# Patient Record
Sex: Female | Born: 1940 | Race: White | Hispanic: No | Smoking: Former smoker
Health system: Southern US, Community
[De-identification: ages and names within clinical notes are randomized; demographics above are authoritative.]

## PROBLEM LIST (undated history)

## (undated) DIAGNOSIS — K219 Gastro-esophageal reflux disease without esophagitis: Secondary | ICD-10-CM

## (undated) DIAGNOSIS — F329 Major depressive disorder, single episode, unspecified: Secondary | ICD-10-CM

## (undated) DIAGNOSIS — J42 Unspecified chronic bronchitis: Secondary | ICD-10-CM

## (undated) DIAGNOSIS — J302 Other seasonal allergic rhinitis: Secondary | ICD-10-CM

## (undated) DIAGNOSIS — K5790 Diverticulosis of intestine, part unspecified, without perforation or abscess without bleeding: Secondary | ICD-10-CM

## (undated) DIAGNOSIS — E7800 Pure hypercholesterolemia, unspecified: Secondary | ICD-10-CM

## (undated) DIAGNOSIS — I10 Essential (primary) hypertension: Secondary | ICD-10-CM

## (undated) DIAGNOSIS — F32A Depression, unspecified: Secondary | ICD-10-CM

## (undated) DIAGNOSIS — C50919 Malignant neoplasm of unspecified site of unspecified female breast: Secondary | ICD-10-CM

## (undated) DIAGNOSIS — C801 Malignant (primary) neoplasm, unspecified: Secondary | ICD-10-CM

## (undated) DIAGNOSIS — J45909 Unspecified asthma, uncomplicated: Secondary | ICD-10-CM

## (undated) DIAGNOSIS — Z923 Personal history of irradiation: Secondary | ICD-10-CM

## (undated) DIAGNOSIS — M1990 Unspecified osteoarthritis, unspecified site: Secondary | ICD-10-CM

## (undated) DIAGNOSIS — M359 Systemic involvement of connective tissue, unspecified: Secondary | ICD-10-CM

## (undated) HISTORY — DX: Major depressive disorder, single episode, unspecified: F32.9

## (undated) HISTORY — PX: ABDOMINAL HYSTERECTOMY: SHX81

## (undated) HISTORY — PX: CAROTID ENDARTERECTOMY: SUR193

## (undated) HISTORY — DX: Depression, unspecified: F32.A

## (undated) HISTORY — DX: Malignant (primary) neoplasm, unspecified: C80.1

## (undated) HISTORY — DX: Pure hypercholesterolemia, unspecified: E78.00

## (undated) HISTORY — DX: Diverticulosis of intestine, part unspecified, without perforation or abscess without bleeding: K57.90

## (undated) HISTORY — PX: CHOLECYSTECTOMY: SHX55

## (undated) HISTORY — DX: Unspecified chronic bronchitis: J42

## (undated) HISTORY — PX: GALLBLADDER SURGERY: SHX652

## (undated) HISTORY — DX: Unspecified asthma, uncomplicated: J45.909

## (undated) HISTORY — PX: BREAST BIOPSY: SHX20

## (undated) HISTORY — DX: Gastro-esophageal reflux disease without esophagitis: K21.9

## (undated) HISTORY — DX: Essential (primary) hypertension: I10

## (undated) HISTORY — DX: Other seasonal allergic rhinitis: J30.2

## (undated) HISTORY — DX: Unspecified osteoarthritis, unspecified site: M19.90

---

## 2003-03-03 HISTORY — PX: COLONOSCOPY: SHX174

## 2003-12-18 ENCOUNTER — Ambulatory Visit: Payer: Self-pay | Admitting: Gastroenterology

## 2004-01-22 ENCOUNTER — Ambulatory Visit: Payer: Self-pay | Admitting: Rheumatology

## 2004-10-10 ENCOUNTER — Ambulatory Visit: Payer: Self-pay | Admitting: Internal Medicine

## 2005-03-18 ENCOUNTER — Other Ambulatory Visit: Payer: Self-pay

## 2005-03-18 ENCOUNTER — Emergency Department: Payer: Self-pay | Admitting: Emergency Medicine

## 2005-03-25 ENCOUNTER — Ambulatory Visit: Payer: Self-pay

## 2005-06-05 ENCOUNTER — Ambulatory Visit: Payer: Self-pay

## 2005-10-20 ENCOUNTER — Ambulatory Visit: Payer: Self-pay

## 2006-07-19 ENCOUNTER — Ambulatory Visit: Payer: Self-pay | Admitting: Internal Medicine

## 2006-08-17 ENCOUNTER — Ambulatory Visit: Payer: Self-pay | Admitting: Internal Medicine

## 2006-09-17 ENCOUNTER — Ambulatory Visit: Payer: Self-pay | Admitting: Internal Medicine

## 2006-11-17 ENCOUNTER — Ambulatory Visit: Payer: Self-pay | Admitting: Internal Medicine

## 2007-12-08 ENCOUNTER — Ambulatory Visit: Payer: Self-pay | Admitting: Internal Medicine

## 2008-02-28 ENCOUNTER — Ambulatory Visit: Payer: Self-pay | Admitting: Internal Medicine

## 2008-03-02 HISTORY — PX: DG  BONE DENSITY (ARMC HX): HXRAD1102

## 2008-03-06 ENCOUNTER — Ambulatory Visit: Payer: Self-pay | Admitting: Internal Medicine

## 2008-03-06 ENCOUNTER — Ambulatory Visit: Payer: Self-pay | Admitting: Vascular Surgery

## 2008-03-08 ENCOUNTER — Inpatient Hospital Stay: Payer: Self-pay | Admitting: Vascular Surgery

## 2008-05-07 ENCOUNTER — Ambulatory Visit: Payer: Self-pay | Admitting: Gastroenterology

## 2008-05-23 ENCOUNTER — Ambulatory Visit: Payer: Self-pay | Admitting: Unknown Physician Specialty

## 2008-12-10 ENCOUNTER — Ambulatory Visit: Payer: Self-pay | Admitting: Internal Medicine

## 2009-12-23 ENCOUNTER — Ambulatory Visit: Payer: Self-pay | Admitting: Internal Medicine

## 2011-02-05 ENCOUNTER — Ambulatory Visit: Payer: Self-pay | Admitting: Internal Medicine

## 2011-03-26 ENCOUNTER — Ambulatory Visit: Payer: Self-pay | Admitting: Internal Medicine

## 2011-04-22 ENCOUNTER — Ambulatory Visit: Payer: Self-pay | Admitting: Internal Medicine

## 2012-02-04 ENCOUNTER — Ambulatory Visit: Payer: Self-pay | Admitting: Hematology and Oncology

## 2012-02-04 LAB — CBC CANCER CENTER
Basophil #: 0.1 x10 3/mm (ref 0.0–0.1)
HCT: 37.8 % (ref 35.0–47.0)
HGB: 13.4 g/dL (ref 12.0–16.0)
Lymphocyte %: 22.2 %
Monocyte #: 0.6 x10 3/mm (ref 0.2–0.9)
Monocyte %: 7.7 %
Neutrophil #: 5.8 x10 3/mm (ref 1.4–6.5)
Neutrophil %: 68.8 %
Platelet: 350 x10 3/mm (ref 150–440)
RDW: 12.1 % (ref 11.5–14.5)
WBC: 8.5 x10 3/mm (ref 3.6–11.0)

## 2012-02-10 ENCOUNTER — Ambulatory Visit: Payer: Self-pay | Admitting: Internal Medicine

## 2012-03-02 ENCOUNTER — Ambulatory Visit: Payer: Self-pay | Admitting: Hematology and Oncology

## 2012-03-02 HISTORY — PX: OTHER SURGICAL HISTORY: SHX169

## 2012-04-26 ENCOUNTER — Ambulatory Visit: Payer: Self-pay | Admitting: Physician Assistant

## 2012-08-09 ENCOUNTER — Ambulatory Visit: Payer: Self-pay | Admitting: Physician Assistant

## 2013-02-21 ENCOUNTER — Ambulatory Visit: Payer: Self-pay | Admitting: Physician Assistant

## 2013-03-02 HISTORY — PX: DIAGNOSTIC MAMMOGRAM: HXRAD719

## 2013-06-09 ENCOUNTER — Ambulatory Visit: Payer: Self-pay | Admitting: Rheumatology

## 2013-08-07 DIAGNOSIS — M5412 Radiculopathy, cervical region: Secondary | ICD-10-CM | POA: Insufficient documentation

## 2013-08-07 DIAGNOSIS — M5030 Other cervical disc degeneration, unspecified cervical region: Secondary | ICD-10-CM | POA: Insufficient documentation

## 2013-08-08 DIAGNOSIS — B029 Zoster without complications: Secondary | ICD-10-CM | POA: Insufficient documentation

## 2013-08-22 DIAGNOSIS — M5416 Radiculopathy, lumbar region: Secondary | ICD-10-CM | POA: Insufficient documentation

## 2013-11-29 DIAGNOSIS — M1990 Unspecified osteoarthritis, unspecified site: Secondary | ICD-10-CM | POA: Insufficient documentation

## 2013-11-29 DIAGNOSIS — H209 Unspecified iridocyclitis: Secondary | ICD-10-CM | POA: Insufficient documentation

## 2013-11-29 DIAGNOSIS — M069 Rheumatoid arthritis, unspecified: Secondary | ICD-10-CM | POA: Insufficient documentation

## 2013-11-29 DIAGNOSIS — R945 Abnormal results of liver function studies: Secondary | ICD-10-CM | POA: Insufficient documentation

## 2014-03-06 DIAGNOSIS — H2512 Age-related nuclear cataract, left eye: Secondary | ICD-10-CM | POA: Diagnosis not present

## 2014-03-06 DIAGNOSIS — H18411 Arcus senilis, right eye: Secondary | ICD-10-CM | POA: Diagnosis not present

## 2014-03-06 DIAGNOSIS — H25012 Cortical age-related cataract, left eye: Secondary | ICD-10-CM | POA: Diagnosis not present

## 2014-03-06 DIAGNOSIS — H2511 Age-related nuclear cataract, right eye: Secondary | ICD-10-CM | POA: Diagnosis not present

## 2014-03-12 DIAGNOSIS — H2512 Age-related nuclear cataract, left eye: Secondary | ICD-10-CM | POA: Diagnosis not present

## 2014-03-12 DIAGNOSIS — H2511 Age-related nuclear cataract, right eye: Secondary | ICD-10-CM | POA: Diagnosis not present

## 2014-03-13 DIAGNOSIS — H2511 Age-related nuclear cataract, right eye: Secondary | ICD-10-CM | POA: Diagnosis not present

## 2014-03-26 DIAGNOSIS — H2511 Age-related nuclear cataract, right eye: Secondary | ICD-10-CM | POA: Diagnosis not present

## 2014-04-19 ENCOUNTER — Ambulatory Visit: Payer: Self-pay

## 2014-08-14 DIAGNOSIS — M5413 Radiculopathy, cervicothoracic region: Secondary | ICD-10-CM | POA: Diagnosis not present

## 2014-08-14 DIAGNOSIS — M9901 Segmental and somatic dysfunction of cervical region: Secondary | ICD-10-CM | POA: Diagnosis not present

## 2014-08-14 DIAGNOSIS — M5033 Other cervical disc degeneration, cervicothoracic region: Secondary | ICD-10-CM | POA: Diagnosis not present

## 2014-08-14 DIAGNOSIS — M5032 Other cervical disc degeneration, mid-cervical region: Secondary | ICD-10-CM | POA: Diagnosis not present

## 2014-09-12 DIAGNOSIS — M5032 Other cervical disc degeneration, mid-cervical region: Secondary | ICD-10-CM | POA: Diagnosis not present

## 2014-09-12 DIAGNOSIS — M9901 Segmental and somatic dysfunction of cervical region: Secondary | ICD-10-CM | POA: Diagnosis not present

## 2014-09-12 DIAGNOSIS — M5413 Radiculopathy, cervicothoracic region: Secondary | ICD-10-CM | POA: Diagnosis not present

## 2014-09-12 DIAGNOSIS — M5033 Other cervical disc degeneration, cervicothoracic region: Secondary | ICD-10-CM | POA: Diagnosis not present

## 2014-10-01 DIAGNOSIS — M5030 Other cervical disc degeneration, unspecified cervical region: Secondary | ICD-10-CM | POA: Diagnosis not present

## 2014-10-01 DIAGNOSIS — M5412 Radiculopathy, cervical region: Secondary | ICD-10-CM | POA: Diagnosis not present

## 2014-10-01 DIAGNOSIS — M4802 Spinal stenosis, cervical region: Secondary | ICD-10-CM | POA: Diagnosis not present

## 2014-11-21 DIAGNOSIS — M5030 Other cervical disc degeneration, unspecified cervical region: Secondary | ICD-10-CM | POA: Diagnosis not present

## 2014-11-21 DIAGNOSIS — M5412 Radiculopathy, cervical region: Secondary | ICD-10-CM | POA: Diagnosis not present

## 2014-11-28 ENCOUNTER — Ambulatory Visit (INDEPENDENT_AMBULATORY_CARE_PROVIDER_SITE_OTHER): Payer: Medicare PPO | Admitting: Family Medicine

## 2014-11-28 ENCOUNTER — Encounter: Payer: Self-pay | Admitting: *Deleted

## 2014-11-28 ENCOUNTER — Other Ambulatory Visit: Payer: Self-pay | Admitting: *Deleted

## 2014-11-28 VITALS — BP 124/66 | HR 68 | Temp 97.8°F | Resp 16 | Ht 62.0 in | Wt 127.4 lb

## 2014-11-28 DIAGNOSIS — J4520 Mild intermittent asthma, uncomplicated: Secondary | ICD-10-CM

## 2014-11-28 DIAGNOSIS — J432 Centrilobular emphysema: Secondary | ICD-10-CM | POA: Insufficient documentation

## 2014-11-28 DIAGNOSIS — Z9889 Other specified postprocedural states: Secondary | ICD-10-CM

## 2014-11-28 DIAGNOSIS — M5030 Other cervical disc degeneration, unspecified cervical region: Secondary | ICD-10-CM | POA: Diagnosis not present

## 2014-11-28 DIAGNOSIS — G4730 Sleep apnea, unspecified: Secondary | ICD-10-CM | POA: Insufficient documentation

## 2014-11-28 DIAGNOSIS — M1990 Unspecified osteoarthritis, unspecified site: Secondary | ICD-10-CM

## 2014-11-28 DIAGNOSIS — I10 Essential (primary) hypertension: Secondary | ICD-10-CM | POA: Diagnosis not present

## 2014-11-28 DIAGNOSIS — E785 Hyperlipidemia, unspecified: Secondary | ICD-10-CM | POA: Insufficient documentation

## 2014-11-28 DIAGNOSIS — Z7689 Persons encountering health services in other specified circumstances: Secondary | ICD-10-CM

## 2014-11-28 DIAGNOSIS — I6529 Occlusion and stenosis of unspecified carotid artery: Secondary | ICD-10-CM | POA: Insufficient documentation

## 2014-11-28 DIAGNOSIS — I6522 Occlusion and stenosis of left carotid artery: Secondary | ICD-10-CM

## 2014-11-28 DIAGNOSIS — J302 Other seasonal allergic rhinitis: Secondary | ICD-10-CM | POA: Diagnosis not present

## 2014-11-28 DIAGNOSIS — J45909 Unspecified asthma, uncomplicated: Secondary | ICD-10-CM | POA: Insufficient documentation

## 2014-11-28 DIAGNOSIS — F329 Major depressive disorder, single episode, unspecified: Secondary | ICD-10-CM

## 2014-11-28 DIAGNOSIS — M5412 Radiculopathy, cervical region: Secondary | ICD-10-CM | POA: Diagnosis not present

## 2014-11-28 DIAGNOSIS — Z23 Encounter for immunization: Secondary | ICD-10-CM | POA: Diagnosis not present

## 2014-11-28 DIAGNOSIS — M5116 Intervertebral disc disorders with radiculopathy, lumbar region: Secondary | ICD-10-CM | POA: Diagnosis not present

## 2014-11-28 DIAGNOSIS — F32A Depression, unspecified: Secondary | ICD-10-CM | POA: Insufficient documentation

## 2014-11-28 DIAGNOSIS — Z7189 Other specified counseling: Secondary | ICD-10-CM

## 2014-11-28 DIAGNOSIS — T7840XA Allergy, unspecified, initial encounter: Secondary | ICD-10-CM | POA: Insufficient documentation

## 2014-11-28 HISTORY — DX: Hyperlipidemia, unspecified: E78.5

## 2014-11-28 MED ORDER — SERTRALINE HCL 50 MG PO TABS: 50.0000 mg | ORAL_TABLET | Freq: Every day | ORAL | Status: DC

## 2014-11-28 MED ORDER — SIMVASTATIN 20 MG PO TABS: 20.0000 mg | ORAL_TABLET | Freq: Every day | ORAL | Status: DC

## 2014-11-28 MED ORDER — AMLODIPINE BESYLATE 5 MG PO TABS: 5.0000 mg | ORAL_TABLET | Freq: Every day | ORAL | Status: DC

## 2014-11-28 MED ORDER — GABAPENTIN 300 MG PO CAPS: 300.0000 mg | ORAL_CAPSULE | Freq: Three times a day (TID) | ORAL | Status: DC

## 2014-11-28 MED ORDER — MONTELUKAST SODIUM 10 MG PO TABS: 10.0000 mg | ORAL_TABLET | Freq: Every day | ORAL | Status: DC

## 2014-11-28 MED ORDER — LOSARTAN POTASSIUM-HCTZ 100-25 MG PO TABS: 1.0000 | ORAL_TABLET | Freq: Every day | ORAL | Status: DC

## 2014-11-28 NOTE — Progress Notes (Signed)
Name: Elizabeth Bailey   MRN: [id number]    DOB: 01/30/1941   Date:11/28/2014       Progress Note  Subjective  Chief Complaint  Chief Complaint  Patient presents with  . Establish Care    HPI Here to establish care.  Has HBP, elevated lipids, Rheumatoid arthritiis, Chronic nerve pain.  Depression, insomnia, anxiety.  Has some depression ands chronic anxiety.   COPD,  She has taken Xanax daily for years.  Also has reduced recommended dose of Gabapentin on her own.   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  NEg CXR since.  No problem-specific assessment & plan notes found for this encounter.   Past Medical History  Diagnosis Date  . Seasonal allergies   . Arthritis   . Asthma   . Depression   . Diverticulosis   . Chronic bronchitis   . GERD (gastroesophageal reflux disease)   . Hypertension   . High cholesterol     Past Surgical History  Procedure Laterality Date  . Diagnostic mammogram  2015  . Pap smear  2014  . Colonoscopy  2005  . Dg  bone density ([facility] hx)  2010  . Abdominal hysterectomy    . Breast biopsy    . Gallbladder surgery      Family History  Problem Relation Age of Onset  . Pneumonia Mother   . Depression Mother   . Depression Sister   . Stroke Brother   . Heart disease Brother     Social History   Social History  . Marital Status: Married    Spouse Name: N/A  . Number of Children: N/A  . Years of Education: N/A   Occupational History  . Not on file.   Social History Main Topics  . Smoking status: Former Smoker -- 0.50 packs/day for 10 years    Types: Cigarettes  . Smokeless tobacco: Never Used     Comment: quit 1988  . Alcohol Use: 0.0 oz/week    0 Standard drinks or equivalent per week     Comment: wine twice weekly  . Drug Use: No  . Sexual Activity: Not on file   Other Topics Concern  . Not on file   Social History Narrative  . No narrative on file     Current outpatient prescriptions:  .  albuterol (PROVENTIL) (2.5 MG/3ML)  0.083% nebulizer solution, Inhale 2 puffs into the lungs every 6 (six) hours as needed., Disp: , Rfl:  .  amLODipine (NORVASC) 5 MG tablet, Take 1 tablet (5 mg total) by mouth daily., Disp: 30 tablet, Rfl: 12 .  budesonide-formoterol (SYMBICORT) 160-4.5 MCG/ACT inhaler, Inhale 1 puff into the lungs daily., Disp: , Rfl:  .  calcium-vitamin D (CALCIUM 500/D) 500-200 MG-UNIT tablet, Take 1 tablet by mouth daily., Disp: , Rfl:  .  DHA-EPA-VITAMIN E PO, Take 1 capsule by mouth daily., Disp: , Rfl:  .  esomeprazole (NEXIUM) 40 MG capsule, Take 40 mg by mouth daily., Disp: , Rfl:  .  fluticasone (FLONASE) 50 MCG/ACT nasal spray, Place 2 sprays into the nose daily as needed., Disp: , Rfl:  .  folic acid (FOLVITE) 1 MG tablet, Take 1 tablet by mouth daily., Disp: , Rfl:  .  gabapentin (NEURONTIN) 300 MG capsule, Take 1 capsule (300 mg total) by mouth 3 (three) times daily., Disp: 90 capsule, Rfl: 6 .  HUMIRA PEN 40 MG/0.8ML PNKT, Inject 0.8 mLs into the skin as directed., Disp: , Rfl:  .  HYDROcodone-acetaminophen (NORCO/VICODIN) 5-325 MG tablet, Take 1 tablet by mouth 2 (two) times daily as needed., Disp: , Rfl:  .  losartan-hydrochlorothiazide (HYZAAR) 100-25 MG tablet, Take 1 tablet by mouth daily., Disp: 30 tablet, Rfl: 12 .  Melatonin 10 MG TABS, Take 1 tablet by mouth at bedtime., Disp: , Rfl:  .  montelukast (SINGULAIR) 10 MG tablet, Take 1 tablet (10 mg total) by mouth daily., Disp: 30 tablet, Rfl: 12 .  Multiple Vitamin (MULTI-VITAMINS) TABS, Take 1 tablet by mouth daily., Disp: , Rfl:  .  simvastatin (ZOCOR) 20 MG tablet, Take 1 tablet (20 mg total) by mouth at bedtime., Disp: 30 tablet, Rfl: 12  Allergies  Allergen Reactions  . Flucytosine Other (See Comments)    Sore mouth  . Naproxen Nausea Only    GI upset  . Other Other (See Comments)    Frequent urination     Review of Systems  Constitutional: Negative for fever, chills, weight loss and malaise/fatigue.  HENT: Negative for  hearing loss.   Eyes: Negative for blurred vision and double vision.  Respiratory: Positive for shortness of breath. Negative for cough, sputum production and wheezing.   Cardiovascular: Negative for chest pain, palpitations, orthopnea, leg swelling and PND.  Gastrointestinal: Positive for heartburn. Negative for abdominal pain and blood in stool.  Genitourinary: Negative for dysuria, urgency and frequency.  Musculoskeletal: Positive for joint pain and neck pain.  Skin: Negative for rash.  Neurological: Negative for sensory change, focal weakness, weakness and headaches.  Psychiatric/Behavioral: Positive for depression. The patient is nervous/anxious and has insomnia.       Objective  Filed Vitals:   11/28/14 1008  BP: 124/66  Pulse: 68  Temp: 97.8 F (36.6 C)  Resp: 16  Height: 5\' 2"  (1.575 m)  Weight: 127 lb 6.4 oz (57.788 kg)    Physical Exam  Constitutional: She is well-developed, well-nourished, and in no distress. No distress.  HENT:  Head: Normocephalic and atraumatic.  Eyes: Conjunctivae and EOM are normal. Pupils are equal, round, and reactive to light. No scleral icterus.  Neck: Normal range of motion. Neck supple. Carotid bruit is not present. No thyromegaly present.  Cardiovascular: Normal rate, regular rhythm, normal heart sounds and intact distal pulses.  Exam reveals no gallop and no friction rub.   No murmur heard. Pulmonary/Chest: Effort normal and breath sounds normal. No respiratory distress. She has no wheezes. She has no rales.  Abdominal: Soft. Bowel sounds are normal. She exhibits no distension, no abdominal bruit and no mass. There is no tenderness.  Musculoskeletal: She exhibits no edema.  Lymphadenopathy:    She has no cervical adenopathy.  Neurological: She is alert.  Psychiatric:   Affect is sl. Depressed.  Vitals reviewed.      No results found for this or any previous visit (from the past 2160 hour(s)).   Assessment & Plan  Problem  List Items Addressed This Visit      Cardiovascular and Mediastinum   BP (high blood pressure)   Relevant Medications   losartan-hydrochlorothiazide (HYZAAR) 100-25 MG tablet   amLODipine (NORVASC) 5 MG tablet   simvastatin (ZOCOR) 20 MG tablet   Other Relevant Orders   Comprehensive Metabolic Panel (CMET)   TSH     Respiratory   Airway hyperreactivity   Relevant Medications   montelukast (SINGULAIR) 10 MG tablet   Other Relevant Orders   CBC with Differential     Nervous and Auditory   Cervical radiculitis   Relevant Medications  gabapentin (NEURONTIN) 300 MG capsule   Neuritis or radiculitis due to rupture of lumbar intervertebral disc   Relevant Medications   gabapentin (NEURONTIN) 300 MG capsule     Musculoskeletal and Integument   DDD (degenerative disc disease), cervical   Arthritis    Other Visit Diagnoses    Need for influenza vaccination    -  Primary    Relevant Orders    Flu vaccine HIGH DOSE PF (Fluzone High dose)    Seasonal allergies        Relevant Medications    montelukast (SINGULAIR) 10 MG tablet    Hyperlipidemia        Relevant Medications    losartan-hydrochlorothiazide (HYZAAR) 100-25 MG tablet    amLODipine (NORVASC) 5 MG tablet    simvastatin (ZOCOR) 20 MG tablet    Other Relevant Orders    Lipid Profile       Meds ordered this encounter  Medications  . Melatonin 10 MG TABS    Sig: Take 1 tablet by mouth at bedtime.  . gabapentin (NEURONTIN) 300 MG capsule    Sig: Take 1 capsule (300 mg total) by mouth 3 (three) times daily.    Dispense:  90 capsule    Refill:  6  . losartan-hydrochlorothiazide (HYZAAR) 100-25 MG tablet    Sig: Take 1 tablet by mouth daily.    Dispense:  30 tablet    Refill:  12  . amLODipine (NORVASC) 5 MG tablet    Sig: Take 1 tablet (5 mg total) by mouth daily.    Dispense:  30 tablet    Refill:  12  . montelukast (SINGULAIR) 10 MG tablet    Sig: Take 1 tablet (10 mg total) by mouth daily.    Dispense:  30  tablet    Refill:  12  . simvastatin (ZOCOR) 20 MG tablet    Sig: Take 1 tablet (20 mg total) by mouth at bedtime.    Dispense:  30 tablet    Refill:  12    1. Need for influenza vaccination  - Flu vaccine HIGH DOSE PF (Fluzone High dose)  2. Essential hypertension  - losartan-hydrochlorothiazide (HYZAAR) 100-25 MG tablet; Take 1 tablet by mouth daily.  Dispense: 30 tablet; Refill: 12 - amLODipine (NORVASC) 5 MG tablet; Take 1 tablet (5 mg total) by mouth daily.  Dispense: 30 tablet; Refill: 12 - Comprehensive Metabolic Panel (CMET) - TSH  3. Airway hyperreactivity, mild intermittent, uncomplicated  - CBC with Differential  4. Cervical radiculitis   5. DDD (degenerative disc disease), cervical   6. Arthritis   7. Neuritis or radiculitis due to rupture of lumbar intervertebral disc  - gabapentin (NEURONTIN) 300 MG capsule; Take 1 capsule (300 mg total) by mouth 3 (three) times daily.  Dispense: 90 capsule; Refill: 6  8. Seasonal allergies  - montelukast (SINGULAIR) 10 MG tablet; Take 1 tablet (10 mg total) by mouth daily.  Dispense: 30 tablet; Refill: 12  9. Hyperlipidemia  - simvastatin (ZOCOR) 20 MG tablet; Take 1 tablet (20 mg total) by mouth at bedtime.  Dispense: 30 tablet; Refill: 12 - Lipid Profile  10. Depression  - sertraline (ZOLOFT) 50 MG tablet; Take 1 tablet (50 mg total) by mouth daily.  Dispense: 30 tablet; Refill: 3  11. Carotid artery stenosis, left   12. H/O carotid endarterectomy

## 2014-12-06 DIAGNOSIS — J449 Chronic obstructive pulmonary disease, unspecified: Secondary | ICD-10-CM | POA: Diagnosis not present

## 2014-12-06 DIAGNOSIS — M0579 Rheumatoid arthritis with rheumatoid factor of multiple sites without organ or systems involvement: Secondary | ICD-10-CM | POA: Diagnosis not present

## 2014-12-06 DIAGNOSIS — M5030 Other cervical disc degeneration, unspecified cervical region: Secondary | ICD-10-CM | POA: Diagnosis not present

## 2014-12-11 DIAGNOSIS — M9903 Segmental and somatic dysfunction of lumbar region: Secondary | ICD-10-CM | POA: Diagnosis not present

## 2014-12-11 DIAGNOSIS — M4607 Spinal enthesopathy, lumbosacral region: Secondary | ICD-10-CM | POA: Diagnosis not present

## 2014-12-11 DIAGNOSIS — M9904 Segmental and somatic dysfunction of sacral region: Secondary | ICD-10-CM | POA: Diagnosis not present

## 2014-12-12 DIAGNOSIS — I10 Essential (primary) hypertension: Secondary | ICD-10-CM | POA: Diagnosis not present

## 2014-12-12 DIAGNOSIS — J4520 Mild intermittent asthma, uncomplicated: Secondary | ICD-10-CM | POA: Diagnosis not present

## 2014-12-12 DIAGNOSIS — E785 Hyperlipidemia, unspecified: Secondary | ICD-10-CM | POA: Diagnosis not present

## 2014-12-13 LAB — COMPREHENSIVE METABOLIC PANEL
ALK PHOS: 69 IU/L (ref 39–117)
ALT: 16 IU/L (ref 0–32)
AST: 27 IU/L (ref 0–40)
Albumin/Globulin Ratio: 1.4 (ref 1.1–2.5)
Albumin: 4.2 g/dL (ref 3.5–4.8)
BILIRUBIN TOTAL: 0.9 mg/dL (ref 0.0–1.2)
BUN/Creatinine Ratio: 13 (ref 11–26)
BUN: 12 mg/dL (ref 8–27)
CHLORIDE: 89 mmol/L — AB (ref 97–108)
CO2: 27 mmol/L (ref 18–29)
Calcium: 9.9 mg/dL (ref 8.7–10.3)
Creatinine, Ser: 0.91 mg/dL (ref 0.57–1.00)
GFR calc Af Amer: 72 mL/min/{1.73_m2} (ref >59)
GFR calc non Af Amer: 63 mL/min/{1.73_m2} (ref >59)
GLUCOSE: 103 mg/dL — AB (ref 65–99)
Globulin, Total: 3 g/dL (ref 1.5–4.5)
Potassium: 4.3 mmol/L (ref 3.5–5.2)
Sodium: 131 mmol/L — ABNORMAL LOW (ref 134–144)
Total Protein: 7.2 g/dL (ref 6.0–8.5)

## 2014-12-13 LAB — CBC WITH DIFFERENTIAL/PLATELET
BASOS ABS: 0 10*3/uL (ref 0.0–0.2)
Basos: 0 %
EOS (ABSOLUTE): 0.2 10*3/uL (ref 0.0–0.4)
Eos: 2 %
HEMOGLOBIN: 13.9 g/dL (ref 11.1–15.9)
Hematocrit: 41 % (ref 34.0–46.6)
IMMATURE GRANS (ABS): 0 10*3/uL (ref 0.0–0.1)
IMMATURE GRANULOCYTES: 0 %
LYMPHS: 30 %
Lymphocytes Absolute: 2.8 10*3/uL (ref 0.7–3.1)
MCH: 32.2 pg (ref 26.6–33.0)
MCHC: 33.9 g/dL (ref 31.5–35.7)
MCV: 95 fL (ref 79–97)
MONOCYTES: 9 %
Monocytes Absolute: 0.8 10*3/uL (ref 0.1–0.9)
NEUTROS PCT: 59 %
Neutrophils Absolute: 5.4 10*3/uL (ref 1.4–7.0)
Platelets: 492 10*3/uL — ABNORMAL HIGH (ref 150–379)
RBC: 4.32 x10E6/uL (ref 3.77–5.28)
RDW: 13.4 % (ref 12.3–15.4)
WBC: 9.3 10*3/uL (ref 3.4–10.8)

## 2014-12-13 LAB — LIPID PANEL
CHOLESTEROL TOTAL: 178 mg/dL (ref 100–199)
Chol/HDL Ratio: 2.1 ratio units (ref 0.0–4.4)
HDL: 84 mg/dL (ref >39)
LDL CALC: 80 mg/dL (ref 0–99)
TRIGLYCERIDES: 72 mg/dL (ref 0–149)
VLDL CHOLESTEROL CAL: 14 mg/dL (ref 5–40)

## 2014-12-13 LAB — TSH: TSH: 2.5 u[IU]/mL (ref 0.450–4.500)

## 2014-12-14 NOTE — Progress Notes (Signed)
Advised to lower water intake. [facility]

## 2015-01-09 ENCOUNTER — Ambulatory Visit (INDEPENDENT_AMBULATORY_CARE_PROVIDER_SITE_OTHER): Payer: Medicare PPO | Admitting: Family Medicine

## 2015-01-09 ENCOUNTER — Encounter: Payer: Self-pay | Admitting: Family Medicine

## 2015-01-09 VITALS — BP 107/56 | HR 80 | Resp 16 | Ht 62.0 in | Wt 131.4 lb

## 2015-01-09 DIAGNOSIS — I10 Essential (primary) hypertension: Secondary | ICD-10-CM

## 2015-01-09 DIAGNOSIS — Z23 Encounter for immunization: Secondary | ICD-10-CM

## 2015-01-09 DIAGNOSIS — M0579 Rheumatoid arthritis with rheumatoid factor of multiple sites without organ or systems involvement: Secondary | ICD-10-CM | POA: Insufficient documentation

## 2015-01-09 DIAGNOSIS — F329 Major depressive disorder, single episode, unspecified: Secondary | ICD-10-CM | POA: Diagnosis not present

## 2015-01-09 DIAGNOSIS — M069 Rheumatoid arthritis, unspecified: Secondary | ICD-10-CM

## 2015-01-09 DIAGNOSIS — E785 Hyperlipidemia, unspecified: Secondary | ICD-10-CM

## 2015-01-09 DIAGNOSIS — J449 Chronic obstructive pulmonary disease, unspecified: Secondary | ICD-10-CM | POA: Diagnosis not present

## 2015-01-09 DIAGNOSIS — G4700 Insomnia, unspecified: Secondary | ICD-10-CM | POA: Diagnosis not present

## 2015-01-09 DIAGNOSIS — F32A Depression, unspecified: Secondary | ICD-10-CM

## 2015-01-09 MED ORDER — SIMVASTATIN 20 MG PO TABS: ORAL_TABLET | ORAL | Status: DC

## 2015-01-09 MED ORDER — SERTRALINE HCL 50 MG PO TABS: ORAL_TABLET | ORAL | Status: DC

## 2015-01-09 NOTE — Patient Instructions (Signed)
Continue other medications at current doses.

## 2015-01-09 NOTE — Progress Notes (Signed)
Name: Elizabeth Bailey   MRN: [id number]    DOB: December 06, 1940   Date:01/09/2015       Progress Note  Subjective  Chief Complaint  Chief Complaint  Patient presents with  . Hypertension    HPI Here for f/u of HBP.  Taking meds.  Also with COPD and depression with insomnia.  Not sleeping well.  Requests Prevnar today.  No problem-specific assessment & plan notes found for this encounter.   Past Medical History  Diagnosis Date  . Seasonal allergies   . Arthritis   . Asthma   . Depression   . Diverticulosis   . Chronic bronchitis ([place])   . GERD (gastroesophageal reflux disease)   . Hypertension   . High cholesterol     Social History  Substance Use Topics  . Smoking status: Former Smoker -- 0.50 packs/day for 10 years    Types: Cigarettes  . Smokeless tobacco: Never Used     Comment: quit 1988  . Alcohol Use: 0.0 oz/week    0 Standard drinks or equivalent per week     Comment: wine twice weekly     Current outpatient prescriptions:  .  Adalimumab (HUMIRA PEN) 40 MG/0.8ML PNKT, Inject into the skin., Disp: , Rfl:  .  albuterol (PROVENTIL) (2.5 MG/3ML) 0.083% nebulizer solution, Inhale 2 puffs into the lungs every 6 (six) hours as needed., Disp: , Rfl:  .  amLODipine (NORVASC) 5 MG tablet, Take 1 tablet (5 mg total) by mouth daily., Disp: 30 tablet, Rfl: 12 .  budesonide-formoterol (SYMBICORT) 160-4.5 MCG/ACT inhaler, Inhale 1 puff into the lungs daily., Disp: , Rfl:  .  calcium-vitamin D (CALCIUM 500/D) 500-200 MG-UNIT tablet, Take 1 tablet by mouth daily., Disp: , Rfl:  .  DHA-EPA-VITAMIN E PO, Take 1 capsule by mouth daily., Disp: , Rfl:  .  esomeprazole (NEXIUM) 40 MG capsule, Take 40 mg by mouth daily., Disp: , Rfl:  .  fluticasone (FLONASE) 50 MCG/ACT nasal spray, Place 2 sprays into the nose daily as needed., Disp: , Rfl:  .  folic acid (FOLVITE) 1 MG tablet, Take 1 tablet by mouth daily., Disp: , Rfl:  .  gabapentin (NEURONTIN) 300 MG capsule, Take 1 capsule (300 mg  total) by mouth 3 (three) times daily., Disp: 90 capsule, Rfl: 6 .  HYDROcodone-acetaminophen (NORCO/VICODIN) 5-325 MG tablet, Take 1 tablet by mouth 2 (two) times daily as needed., Disp: , Rfl:  .  losartan-hydrochlorothiazide (HYZAAR) 100-25 MG tablet, Take 1 tablet by mouth daily., Disp: 30 tablet, Rfl: 12 .  Melatonin 10 MG TABS, Take 2 tablets by mouth at bedtime. , Disp: , Rfl:  .  montelukast (SINGULAIR) 10 MG tablet, Take 1 tablet (10 mg total) by mouth daily., Disp: 30 tablet, Rfl: 12 .  Multiple Vitamin (MULTI-VITAMINS) TABS, Take 1 tablet by mouth daily., Disp: , Rfl:  .  sertraline (ZOLOFT) 50 MG tablet, Take 1.5 tablets daily, Disp: 45 tablet, Rfl: 6 .  simvastatin (ZOCOR) 20 MG tablet, Take 1 tablet every other night., Disp: 30 tablet, Rfl: 12  Allergies  Allergen Reactions  . Flucytosine Other (See Comments)    Sore mouth  . Naproxen Nausea Only    GI upset  . Other Other (See Comments)    Frequent urination    Review of Systems  Constitutional: Negative for fever, chills, weight loss and malaise/fatigue.  HENT: Negative for hearing loss.   Eyes: Negative for blurred vision and double vision.  Respiratory: Negative for cough, sputum production,  shortness of breath and wheezing.   Cardiovascular: Negative for chest pain, palpitations and leg swelling.  Gastrointestinal: Negative for heartburn, abdominal pain and blood in stool.  Genitourinary: Negative for dysuria, urgency and frequency.  Musculoskeletal: Positive for back pain and joint pain. Negative for myalgias.  Skin: Negative for rash.  Neurological: Negative for weakness and headaches.  Psychiatric/Behavioral: Positive for depression. The patient has insomnia.       Objective  Filed Vitals:   01/09/15 0952  BP: 107/56  Pulse: 80  Resp: 16  Height: 5\' 2"  (1.575 m)  Weight: 131 lb 6.4 oz (59.603 kg)     Physical Exam  Constitutional: She is oriented to person, place, and time and well-developed,  well-nourished, and in no distress. No distress.  HENT:  Head: Normocephalic and atraumatic.  Eyes: Conjunctivae and EOM are normal. Pupils are equal, round, and reactive to light. No scleral icterus.  Neck: Normal range of motion. Neck supple. Carotid bruit is not present. No thyromegaly present.  Cardiovascular: Normal rate, regular rhythm, normal heart sounds and intact distal pulses.  Exam reveals no gallop and no friction rub.   No murmur heard. Pulmonary/Chest: Effort normal and breath sounds normal. No respiratory distress. She has no wheezes. She has no rales.  Abdominal: Soft. Bowel sounds are normal. She exhibits no distension, no abdominal bruit and no mass. There is no tenderness.  Musculoskeletal: She exhibits no edema.  RA changed of bilateral hands/fingers  Lymphadenopathy:    She has no cervical adenopathy.  Neurological: She is alert and oriented to person, place, and time.  Vitals reviewed.     Recent Results (from the past 2160 hour(s))  Comprehensive Metabolic Panel (CMET)     Status: Abnormal   Collection Time: 12/12/14  8:04 AM  Result Value Ref Range   Glucose 103 (H) 65 - 99 mg/dL   BUN 12 8 - 27 mg/dL   Creatinine, Ser 0.91 0.57 - 1.00 mg/dL   GFR calc non Af Amer 63 >59 mL/min/1.73   GFR calc Af Amer 72 >59 mL/min/1.73   BUN/Creatinine Ratio 13 11 - 26   Sodium 131 (L) 134 - 144 mmol/L    Comment: **Effective December 17, 2014 the reference interval**   for Sodium, Serum will be changing to:                                             136 - 144    Potassium 4.3 3.5 - 5.2 mmol/L    Comment: **Effective December 17, 2014 the reference interval**   for Potassium, Serum will be changing to:                          0 -  7 days        3.7 - 5.2                          8 - 30 days        3.7 - 6.4                          1 -  6 months      3.8 - 6.0  7 months -  1 year        3.8 - 5.3                              >1 year        3.5 -  5.2    Chloride 89 (L) 97 - 108 mmol/L    Comment: **Effective December 17, 2014 the reference interval**   for Chloride, Serum will be changing to:                                              97 - 106    CO2 27 18 - 29 mmol/L   Calcium 9.9 8.7 - 10.3 mg/dL   Total Protein 7.2 6.0 - 8.5 g/dL   Albumin 4.2 3.5 - 4.8 g/dL   Globulin, Total 3.0 1.5 - 4.5 g/dL   Albumin/Globulin Ratio 1.4 1.1 - 2.5   Bilirubin Total 0.9 0.0 - 1.2 mg/dL   Alkaline Phosphatase 69 39 - 117 IU/L   AST 27 0 - 40 IU/L   ALT 16 0 - 32 IU/L  CBC with Differential     Status: Abnormal   Collection Time: 12/12/14  8:04 AM  Result Value Ref Range   WBC 9.3 3.4 - 10.8 x10E3/uL   RBC 4.32 3.77 - 5.28 x10E6/uL   Hemoglobin 13.9 11.1 - 15.9 g/dL   Hematocrit 41.0 34.0 - 46.6 %   MCV 95 79 - 97 fL   MCH 32.2 26.6 - 33.0 pg   MCHC 33.9 31.5 - 35.7 g/dL   RDW 13.4 12.3 - 15.4 %   Platelets 492 (H) 150 - 379 x10E3/uL   Neutrophils 59 %   Lymphs 30 %   Monocytes 9 %   Eos 2 %   Basos 0 %   Neutrophils Absolute 5.4 1.4 - 7.0 x10E3/uL   Lymphocytes Absolute 2.8 0.7 - 3.1 x10E3/uL   Monocytes Absolute 0.8 0.1 - 0.9 x10E3/uL   EOS (ABSOLUTE) 0.2 0.0 - 0.4 x10E3/uL   Basophils Absolute 0.0 0.0 - 0.2 x10E3/uL   Immature Granulocytes 0 %   Immature Grans (Abs) 0.0 0.0 - 0.1 x10E3/uL  Lipid Profile     Status: None   Collection Time: 12/12/14  8:04 AM  Result Value Ref Range   Cholesterol, Total 178 100 - 199 mg/dL   Triglycerides 72 0 - 149 mg/dL   HDL 84 >39 mg/dL    Comment: According to ATP-III Guidelines, HDL-C >59 mg/dL is considered a negative risk factor for CHD.    VLDL Cholesterol Cal 14 5 - 40 mg/dL   LDL Calculated 80 0 - 99 mg/dL   Chol/HDL Ratio 2.1 0.0 - 4.4 ratio units    Comment:                                   T. Chol/HDL Ratio                                             Men  Women  1/2 Avg.Risk  3.4    3.3                                   Avg.Risk  5.0     4.4                                2X Avg.Risk  9.6    7.1                                3X Avg.Risk 23.4   11.0   TSH     Status: None   Collection Time: 12/12/14  8:04 AM  Result Value Ref Range   TSH 2.500 0.450 - 4.500 uIU/mL     Assessment & Plan 1. Essential hypertension  - Basic Metabolic Panel (BMET)  2. Rheumatoid arthritis involving both hands, unspecified rheumatoid factor presence ([place])   3. Chronic obstructive pulmonary disease, unspecified COPD type ([place])   4. Depression  - sertraline (ZOLOFT) 50 MG tablet; Take 1.5 tablets daily  Dispense: 45 tablet; Refill: 6  5. Insomnia  - sertraline (ZOLOFT) 50 MG tablet; Take 1.5 tablets daily  Dispense: 45 tablet; Refill: 6  6. Immunization due  - Pneumococcal conjugate vaccine 13-valent  7. Hyperlipidemia  - simvastatin (ZOCOR) 20 MG tablet; Take 1 tablet every other night.  Dispense: 30 tablet; Refill: 12

## 2015-01-15 DIAGNOSIS — M9904 Segmental and somatic dysfunction of sacral region: Secondary | ICD-10-CM | POA: Diagnosis not present

## 2015-01-15 DIAGNOSIS — M9903 Segmental and somatic dysfunction of lumbar region: Secondary | ICD-10-CM | POA: Diagnosis not present

## 2015-01-15 DIAGNOSIS — M4607 Spinal enthesopathy, lumbosacral region: Secondary | ICD-10-CM | POA: Diagnosis not present

## 2015-01-16 DIAGNOSIS — J069 Acute upper respiratory infection, unspecified: Secondary | ICD-10-CM | POA: Diagnosis not present

## 2015-02-11 DIAGNOSIS — I10 Essential (primary) hypertension: Secondary | ICD-10-CM | POA: Diagnosis not present

## 2015-02-12 LAB — BASIC METABOLIC PANEL
BUN/Creatinine Ratio: 15 (ref 11–26)
BUN: 13 mg/dL (ref 8–27)
CALCIUM: 9.5 mg/dL (ref 8.7–10.3)
CHLORIDE: 92 mmol/L — AB (ref 96–106)
CO2: 26 mmol/L (ref 18–29)
Creatinine, Ser: 0.88 mg/dL (ref 0.57–1.00)
GFR calc Af Amer: 75 mL/min/{1.73_m2} (ref >59)
GFR calc non Af Amer: 65 mL/min/{1.73_m2} (ref >59)
GLUCOSE: 85 mg/dL (ref 65–99)
Potassium: 4.2 mmol/L (ref 3.5–5.2)
Sodium: 134 mmol/L (ref 134–144)

## 2015-03-28 ENCOUNTER — Telehealth: Payer: Self-pay | Admitting: Family Medicine

## 2015-03-28 ENCOUNTER — Encounter: Payer: Self-pay | Admitting: Family Medicine

## 2015-03-28 ENCOUNTER — Ambulatory Visit (INDEPENDENT_AMBULATORY_CARE_PROVIDER_SITE_OTHER): Payer: 59 | Admitting: Family Medicine

## 2015-03-28 VITALS — BP 135/75 | HR 61 | Temp 97.5°F | Resp 16 | Ht 62.0 in | Wt 133.0 lb

## 2015-03-28 DIAGNOSIS — G4700 Insomnia, unspecified: Secondary | ICD-10-CM

## 2015-03-28 DIAGNOSIS — I10 Essential (primary) hypertension: Secondary | ICD-10-CM | POA: Diagnosis not present

## 2015-03-28 DIAGNOSIS — R2231 Localized swelling, mass and lump, right upper limb: Secondary | ICD-10-CM

## 2015-03-28 DIAGNOSIS — R2230 Localized swelling, mass and lump, unspecified upper limb: Secondary | ICD-10-CM | POA: Insufficient documentation

## 2015-03-28 MED ORDER — ESCITALOPRAM OXALATE 5 MG PO TABS: 5.0000 mg | ORAL_TABLET | Freq: Every day | ORAL | Status: DC

## 2015-03-28 NOTE — Telephone Encounter (Signed)
Tiffany from [facility] called have a question about a medication that was sent in today wanted to know if this was replacing something

## 2015-03-28 NOTE — Progress Notes (Signed)
Name: Elizabeth Bailey   MRN: [id number]    DOB: November 21, 1940   Date:03/28/2015       Progress Note  Subjective  Chief Complaint  Chief Complaint  Patient presents with  . Hypertension  . Insomnia  . right hand lesion    HPI Here for f/u of HBP.  She has not taken her BP meds today.  She also c/o not being able to sleep.  Not using Melatonin and has stopped Zoloft.  She has had insomnia for a long time.  She asks for Xanax which she had taken for quite some time in the past for this.    Also has a "place" on her R 3rd finger that is sore.  No trauma.  No problem-specific assessment & plan notes found for this encounter.   Past Medical History  Diagnosis Date  . Seasonal allergies   . Arthritis   . Asthma   . Depression   . Diverticulosis   . Chronic bronchitis ([place])   . GERD (gastroesophageal reflux disease)   . Hypertension   . High cholesterol     Past Surgical History  Procedure Laterality Date  . Diagnostic mammogram  2015  . Pap smear  2014  . Colonoscopy  2005  . Dg  bone density ([facility] hx)  2010  . Abdominal hysterectomy    . Breast biopsy    . Gallbladder surgery      Family History  Problem Relation Age of Onset  . Pneumonia Mother   . Depression Mother   . Depression Sister   . Stroke Brother   . Heart disease Brother     Social History   Social History  . Marital Status: Married    Spouse Name: N/A  . Number of Children: N/A  . Years of Education: N/A   Occupational History  . Not on file.   Social History Main Topics  . Smoking status: Former Smoker -- 0.50 packs/day for 10 years    Types: Cigarettes  . Smokeless tobacco: Never Used     Comment: quit 1988  . Alcohol Use: 0.0 oz/week    0 Standard drinks or equivalent per week     Comment: wine twice weekly  . Drug Use: No  . Sexual Activity: Not on file   Other Topics Concern  . Not on file   Social History Narrative     Current outpatient prescriptions:  .  Adalimumab (HUMIRA  PEN) 40 MG/0.8ML PNKT, Inject into the skin., Disp: , Rfl:  .  albuterol (PROVENTIL) (2.5 MG/3ML) 0.083% nebulizer solution, Inhale 2 puffs into the lungs every 6 (six) hours as needed., Disp: , Rfl:  .  amLODipine (NORVASC) 5 MG tablet, Take 1 tablet (5 mg total) by mouth daily., Disp: 30 tablet, Rfl: 12 .  budesonide-formoterol (SYMBICORT) 160-4.5 MCG/ACT inhaler, Inhale 1 puff into the lungs daily., Disp: , Rfl:  .  calcium-vitamin D (CALCIUM 500/D) 500-200 MG-UNIT tablet, Take 1 tablet by mouth daily., Disp: , Rfl:  .  DHA-EPA-VITAMIN E PO, Take 1 capsule by mouth daily., Disp: , Rfl:  .  fluticasone (FLONASE) 50 MCG/ACT nasal spray, Place 2 sprays into the nose daily as needed., Disp: , Rfl:  .  folic acid (FOLVITE) 1 MG tablet, Take 1 tablet by mouth daily., Disp: , Rfl:  .  gabapentin (NEURONTIN) 300 MG capsule, Take 1 capsule (300 mg total) by mouth 3 (three) times daily., Disp: 90 capsule, Rfl: 6 .  HYDROcodone-acetaminophen (NORCO/VICODIN) 5-325  MG tablet, Take 1 tablet by mouth 2 (two) times daily as needed., Disp: , Rfl:  .  losartan-hydrochlorothiazide (HYZAAR) 100-25 MG tablet, Take 1 tablet by mouth daily., Disp: 30 tablet, Rfl: 12 .  montelukast (SINGULAIR) 10 MG tablet, Take 1 tablet (10 mg total) by mouth daily., Disp: 30 tablet, Rfl: 12 .  Multiple Vitamin (MULTI-VITAMINS) TABS, Take 1 tablet by mouth daily., Disp: , Rfl:  .  simvastatin (ZOCOR) 20 MG tablet, Take 1 tablet every other night., Disp: 30 tablet, Rfl: 12 .  escitalopram (LEXAPRO) 5 MG tablet, Take 1 tablet (5 mg total) by mouth daily., Disp: 30 tablet, Rfl: 6 .  esomeprazole (NEXIUM) 40 MG capsule, Take 40 mg by mouth as needed. Reported on 03/28/2015, Disp: , Rfl:  .  Melatonin 10 MG TABS, Take 2 tablets by mouth at bedtime. Reported on 03/28/2015, Disp: , Rfl:  .  sertraline (ZOLOFT) 50 MG tablet, Take 1.5 tablets daily (Patient not taking: Reported on 03/28/2015), Disp: 45 tablet, Rfl: 6  Allergies  Allergen  Reactions  . Cyclobenzaprine     Other reaction(s): Other (See Comments) Frequent urination  . Flucytosine Other (See Comments)    Sore mouth  . Fluticasone-Salmeterol     Other reaction(s): Other (See Comments) Sore mouth  . Naproxen Nausea Only    GI upset  . Other Other (See Comments)    Other reaction(s): Other (See Comments) Frequent urination Frequent urination     Review of Systems  Constitutional: Negative for fever, chills, weight loss and malaise/fatigue.  HENT: Negative for hearing loss.   Eyes: Negative for blurred vision and double vision.  Respiratory: Negative for cough, shortness of breath and wheezing.   Cardiovascular: Negative for chest pain, palpitations and leg swelling.  Gastrointestinal: Negative for heartburn, abdominal pain and blood in stool.  Genitourinary: Negative for dysuria, urgency and frequency.  Musculoskeletal: Negative for myalgias and joint pain.  Skin: Negative for rash.  Neurological: Negative for dizziness, tremors, weakness and headaches.  Psychiatric/Behavioral: Negative for depression. The patient has insomnia. The patient is not nervous/anxious.       Objective  Filed Vitals:   03/28/15 0808 03/28/15 0853  BP: 154/75 135/75  Pulse: 61   Temp: 97.5 F (36.4 C)   TempSrc: Oral   Resp: 16   Height: 5\' 2"  (1.575 m)   Weight: 133 lb (60.328 kg)     Physical Exam  Constitutional: She is oriented to person, place, and time and well-developed, well-nourished, and in no distress. No distress.  HENT:  Head: Normocephalic and atraumatic.  Eyes: Conjunctivae and EOM are normal. Pupils are equal, round, and reactive to light. No scleral icterus.  Neck: Normal range of motion. Neck supple. Carotid bruit is not present. No thyromegaly present.  Cardiovascular: Normal rate, regular rhythm and normal heart sounds.  Exam reveals no gallop and no friction rub.   No murmur heard. Pulmonary/Chest: Effort normal and breath sounds normal.  No respiratory distress. She has no wheezes. She has no rales.  Abdominal: Soft. Bowel sounds are normal. She exhibits no distension and no mass. There is no tenderness.  Musculoskeletal: She exhibits no edema.  Lymphadenopathy:    She has no cervical adenopathy.  Neurological: She is alert and oriented to person, place, and time.  Skin:  Small red raised lesion on proximal palmar surface of 3rd finger.  Not tender.  Vitals reviewed.      Recent Results (from the past 2160 hour(s))  Basic Metabolic Panel (BMET)  Status: Abnormal   Collection Time: 02/11/15  8:30 AM  Result Value Ref Range   Glucose 85 65 - 99 mg/dL   BUN 13 8 - 27 mg/dL   Creatinine, Ser 0.88 0.57 - 1.00 mg/dL   GFR calc non Af Amer 65 >59 mL/min/1.73   GFR calc Af Amer 75 >59 mL/min/1.73   BUN/Creatinine Ratio 15 11 - 26   Sodium 134 134 - 144 mmol/L    Comment:               **Please note reference interval change**   Potassium 4.2 3.5 - 5.2 mmol/L   Chloride 92 (L) 96 - 106 mmol/L    Comment:               **Please note reference interval change**   CO2 26 18 - 29 mmol/L   Calcium 9.5 8.7 - 10.3 mg/dL     Assessment & Plan  Problem List Items Addressed This Visit      Cardiovascular and Mediastinum   BP (high blood pressure) - Primary     Other   Insomnia   Relevant Medications   escitalopram (LEXAPRO) 5 MG tablet   Nodule of finger      Meds ordered this encounter  Medications  . escitalopram (LEXAPRO) 5 MG tablet    Sig: Take 1 tablet (5 mg total) by mouth daily.    Dispense:  30 tablet    Refill:  6   1. Essential hypertension Cont. meds  2. Insomnia  - escitalopram (LEXAPRO) 5 MG tablet; Take 1 tablet (5 mg total) by mouth daily.  Dispense: 30 tablet; Refill: 6  3. Nodule of finger, right Observe for now.

## 2015-03-28 NOTE — Telephone Encounter (Signed)
Confirmed that Zoloft has been d/c.

## 2015-06-03 ENCOUNTER — Ambulatory Visit (INDEPENDENT_AMBULATORY_CARE_PROVIDER_SITE_OTHER): Payer: Medicare PPO | Admitting: Family Medicine

## 2015-06-03 ENCOUNTER — Encounter: Payer: Self-pay | Admitting: Family Medicine

## 2015-06-03 VITALS — BP 121/64 | HR 67 | Temp 98.0°F | Resp 16 | Ht 62.0 in | Wt 143.0 lb

## 2015-06-03 DIAGNOSIS — I10 Essential (primary) hypertension: Secondary | ICD-10-CM | POA: Diagnosis not present

## 2015-06-03 DIAGNOSIS — E785 Hyperlipidemia, unspecified: Secondary | ICD-10-CM | POA: Diagnosis not present

## 2015-06-03 DIAGNOSIS — M5116 Intervertebral disc disorders with radiculopathy, lumbar region: Secondary | ICD-10-CM

## 2015-06-03 DIAGNOSIS — G4700 Insomnia, unspecified: Secondary | ICD-10-CM

## 2015-06-03 DIAGNOSIS — M069 Rheumatoid arthritis, unspecified: Secondary | ICD-10-CM | POA: Diagnosis not present

## 2015-06-03 DIAGNOSIS — F329 Major depressive disorder, single episode, unspecified: Secondary | ICD-10-CM | POA: Diagnosis not present

## 2015-06-03 DIAGNOSIS — F32A Depression, unspecified: Secondary | ICD-10-CM

## 2015-06-03 DIAGNOSIS — M5030 Other cervical disc degeneration, unspecified cervical region: Secondary | ICD-10-CM

## 2015-06-03 MED ORDER — ESCITALOPRAM OXALATE 10 MG PO TABS: 10.0000 mg | ORAL_TABLET | Freq: Every day | ORAL | Status: DC

## 2015-06-03 NOTE — Progress Notes (Signed)
Name: Elizabeth Bailey   MRN: [id number]    DOB: 1940-11-28   Date:06/03/2015       Progress Note  Subjective  Chief Complaint  Chief Complaint  Patient presents with  . Hypertension    follow up    HPI .Here for f/u of HBP.  Main c/o is difficulty going to sleep. Also still with Neuropathic pain oin L shoulder  And L leg.    No problem-specific assessment & plan notes found for this encounter.   Past Medical History  Diagnosis Date  . Seasonal allergies   . Arthritis   . Asthma   . Depression   . Diverticulosis   . Chronic bronchitis ([place])   . GERD (gastroesophageal reflux disease)   . Hypertension   . High cholesterol     Past Surgical History  Procedure Laterality Date  . Diagnostic mammogram  2015  . Pap smear  2014  . Colonoscopy  2005  . Dg  bone density ([facility] h[facility])  2010  . Abdominal hysterectomy    . Breast biopsy    . Gallbladder surgery      Family History  Problem Relation Age of Onset  . Pneumonia Mother   . Depression Mother   . Depression Sister   . Stroke Brother   . Heart disease Brother     Social History   Social History  . Marital Status: Married    Spouse Name: N/A  . Number of Children: N/A  . Years of Education: N/A   Occupational History  . Not on file.   Social History Main Topics  . Smoking status: Former Smoker -- 0.50 packs/day for 10 years    Types: Cigarettes  . Smokeless tobacco: Never Used     Comment: quit 1988  . Alcohol Use: 0.0 oz/week    0 Standard drinks or equivalent per week     Comment: wine twice weekly  . Drug Use: No  . Sexual Activity: Not on file   Other Topics Concern  . Not on file   Social History Narrative     Current outpatient prescriptions:  .  Adalimumab (HUMIRA PEN) 40 MG/0.8ML PNKT, Inject into the skin., Disp: , Rfl:  .  albuterol (PROVENTIL) (2.5 MG/3ML) 0.083% nebulizer solution, Inhale 2 puffs into the lungs every 6 (six) hours as needed., Disp: , Rfl:  .  amLODipine (NORVASC) 5 MG  tablet, Take 1 tablet (5 mg total) by mouth daily., Disp: 30 tablet, Rfl: 12 .  budesonide-formoterol (SYMBICORT) 160-4.5 MCG/ACT inhaler, Inhale 1 puff into the lungs daily., Disp: , Rfl:  .  calcium-vitamin D (CALCIUM 500/D) 500-200 MG-UNIT tablet, Take 1 tablet by mouth daily., Disp: , Rfl:  .  DHA-EPA-VITAMIN E PO, Take 1 capsule by mouth daily., Disp: , Rfl:  .  escitalopram (LEXAPRO) 10 MG tablet, Take 1 tablet (10 mg total) by mouth daily., Disp: 30 tablet, Rfl: 6 .  esomeprazole (NEXIUM) 40 MG capsule, Take 40 mg by mouth as needed. Reported on 03/28/2015, Disp: , Rfl:  .  fluticasone (FLONASE) 50 MCG/ACT nasal spray, Place 2 sprays into the nose daily as needed., Disp: , Rfl:  .  folic acid (FOLVITE) 1 MG tablet, Take 1 tablet by mouth daily., Disp: , Rfl:  .  gabapentin (NEURONTIN) 300 MG capsule, Take 1 capsule (300 mg total) by mouth 3 (three) times daily., Disp: 90 capsule, Rfl: 6 .  losartan-hydrochlorothiazide (HYZAAR) 100-25 MG tablet, Take 1 tablet by mouth daily., Disp: 30  tablet, Rfl: 12 .  montelukast (SINGULAIR) 10 MG tablet, Take 1 tablet (10 mg total) by mouth daily., Disp: 30 tablet, Rfl: 12 .  Multiple Vitamin (MULTI-VITAMINS) TABS, Take 1 tablet by mouth daily., Disp: , Rfl:  .  simvastatin (ZOCOR) 20 MG tablet, Take 1 tablet every other night. (Patient taking differently: Take 20 mg by mouth daily at 6 PM. Take 1 tablet every night.), Disp: 30 tablet, Rfl: 12 .  Melatonin 10 MG TABS, Take 2 tablets by mouth at bedtime. Reported on 06/03/2015, Disp: , Rfl:  .  Omega 3 1000 MG CAPS, Take 1,000 mg by mouth. omega-3 fatty acids-vitamin E (FISH OIL) 1,000 mg, Disp: , Rfl:   Allergies  Allergen Reactions  . Cyclobenzaprine     Other reaction(s): Other (See Comments) Frequent urination  . Flucytosine Other (See Comments)    Sore mouth  . Fluticasone-Salmeterol     Other reaction(s): Other (See Comments) Sore mouth  . Naproxen Nausea Only    GI upset  . Other Other (See  Comments)    Other reaction(s): Other (See Comments) Frequent urination Frequent urination     Review of Systems  Constitutional: Negative for fever, chills, weight loss and malaise/fatigue.  HENT: Negative for hearing loss.   Eyes: Negative for blurred vision and double vision.  Respiratory: Negative for cough, shortness of breath and wheezing.   Cardiovascular: Negative for chest pain, palpitations and leg swelling.  Gastrointestinal: Negative for heartburn, abdominal pain and blood in stool.  Genitourinary: Negative for dysuria, urgency and frequency.  Musculoskeletal: Positive for myalgias, joint pain and neck pain.  Skin: Negative for rash.  Neurological: Negative for dizziness, tremors, weakness and headaches.  Psychiatric/Behavioral: Positive for depression. The patient has insomnia.       Objective  Filed Vitals:   06/03/15 0813  BP: 121/64  Pulse: 67  Temp: 98 F (36.7 C)  TempSrc: Oral  Resp: 16  Height: 5\' 2"  (1.575 m)  Weight: 143 lb (64.864 kg)    Physical Exam  Constitutional: She is oriented to person, place, and time and well-developed, well-nourished, and in no distress. No distress.  HENT:  Head: Normocephalic and atraumatic.  Eyes: Conjunctivae and EOM are normal. Pupils are equal, round, and reactive to light. No scleral icterus.  Neck: Normal range of motion. Neck supple. Carotid bruit is not present. No thyromegaly present.  Cardiovascular: Normal rate, regular rhythm and normal heart sounds.  Exam reveals no gallop and no friction rub.   No murmur heard. Pulmonary/Chest: Effort normal and breath sounds normal. No respiratory distress. She has no wheezes. She has no rales.  Abdominal: Soft. Bowel sounds are normal. She exhibits no distension, no abdominal bruit and no mass. There is no tenderness.  Musculoskeletal: She exhibits no edema.  Lymphadenopathy:    She has no cervical adenopathy.  Neurological: She is alert and oriented to person,  place, and time.  Psychiatric: Mood, memory, affect and judgment normal.  Vitals reviewed.      No results found for this or any previous visit (from the past 2160 hour(s)).   Assessment & Plan  Problem List Items Addressed This Visit      Cardiovascular and Mediastinum   BP (high blood pressure) - Primary     Nervous and Auditory   Neuritis or radiculitis due to rupture of lumbar intervertebral disc   Relevant Medications   escitalopram (LEXAPRO) 10 MG tablet     Musculoskeletal and Integument   DDD (degenerative disc disease),  cervical   Rheumatoid arthritis (HCC)     Other   Depression   Relevant Medications   escitalopram (LEXAPRO) 10 MG tablet   Hyperlipidemia   Insomnia   Relevant Medications   escitalopram (LEXAPRO) 10 MG tablet      Meds ordered this encounter  Medications  . Omega 3 1000 MG CAPS    Sig: Take 1,000 mg by mouth. omega-3 fatty acids-vitamin E (FISH OIL) 1,000 mg  . escitalopram (LEXAPRO) 10 MG tablet    Sig: Take 1 tablet (10 mg total) by mouth daily.    Dispense:  30 tablet    Refill:  6  1. Essential hypertension Cont. Hyzzar and Amlodipine  2. DDD (degenerative disc disease), cervical   3. Rheumatoid arthritis involving multiple sites, unspecified rheumatoid factor presence ([place]) See Dr. Jefm Bryant  4. Insomnia  - escitalopram (LEXAPRO) 10 MG tablet; Take 1 tablet (10 mg total) by mouth daily.  Dispense: 30 tablet; Refill: 6  5. Depression  - escitalopram (LEXAPRO) 10 MG tablet; Take 1 tablet (10 mg total) by mouth daily.  Dispense: 30 tablet; Refill: 6  6. Hyperlipidemia Cont. meds  7. Neuritis or radiculitis due to rupture of lumbar intervertebral disc See Dr. Sharlet Salina

## 2015-09-10 ENCOUNTER — Ambulatory Visit (INDEPENDENT_AMBULATORY_CARE_PROVIDER_SITE_OTHER): Payer: Medicare Other | Admitting: Family Medicine

## 2015-09-10 ENCOUNTER — Encounter: Payer: Self-pay | Admitting: Family Medicine

## 2015-09-10 VITALS — BP 130/70 | HR 67 | Temp 97.6°F | Resp 16 | Ht 62.0 in | Wt 147.0 lb

## 2015-09-10 DIAGNOSIS — F329 Major depressive disorder, single episode, unspecified: Secondary | ICD-10-CM | POA: Diagnosis not present

## 2015-09-10 DIAGNOSIS — G4700 Insomnia, unspecified: Secondary | ICD-10-CM | POA: Diagnosis not present

## 2015-09-10 DIAGNOSIS — M5030 Other cervical disc degeneration, unspecified cervical region: Secondary | ICD-10-CM | POA: Diagnosis not present

## 2015-09-10 DIAGNOSIS — M5412 Radiculopathy, cervical region: Secondary | ICD-10-CM | POA: Diagnosis not present

## 2015-09-10 DIAGNOSIS — J431 Panlobular emphysema: Secondary | ICD-10-CM | POA: Diagnosis not present

## 2015-09-10 DIAGNOSIS — I10 Essential (primary) hypertension: Secondary | ICD-10-CM

## 2015-09-10 DIAGNOSIS — M069 Rheumatoid arthritis, unspecified: Secondary | ICD-10-CM | POA: Diagnosis not present

## 2015-09-10 DIAGNOSIS — F32A Depression, unspecified: Secondary | ICD-10-CM

## 2015-09-10 MED ORDER — ESCITALOPRAM OXALATE 20 MG PO TABS: ORAL_TABLET | ORAL | Status: DC

## 2015-09-10 NOTE — Progress Notes (Signed)
Name: Elizabeth Bailey   MRN: [id number]    DOB: October 15, 1940   Date:09/10/2015       Progress Note  Subjective  Chief Complaint  Chief Complaint  Patient presents with  . Hypertension  . Insomnia    HPI Here for f/u of HBP.  Taking meds.  C/o allergic rhinnitis.  Taking multiple meds for that and doesn't want to try more.  Also c/o problems with insomnia.  She awakens a lot during the night and trouble going back to sleep.  No problem-specific assessment & plan notes found for this encounter.   Past Medical History  Diagnosis Date  . Seasonal allergies   . Arthritis   . Asthma   . Depression   . Diverticulosis   . Chronic bronchitis ([place])   . GERD (gastroesophageal reflux disease)   . Hypertension   . High cholesterol     Past Surgical History  Procedure Laterality Date  . Diagnostic mammogram  2015  . Pap smear  2014  . Colonoscopy  2005  . Dg  bone density ([facility] h[facility])  2010  . Abdominal hysterectomy    . Breast biopsy    . Gallbladder surgery      Family History  Problem Relation Age of Onset  . Pneumonia Mother   . Depression Mother   . Depression Sister   . Stroke Brother   . Heart disease Brother     Social History   Social History  . Marital Status: Married    Spouse Name: N/A  . Number of Children: N/A  . Years of Education: N/A   Occupational History  . Not on file.   Social History Main Topics  . Smoking status: Former Smoker -- 0.50 packs/day for 10 years    Types: Cigarettes  . Smokeless tobacco: Never Used     Comment: quit 1988  . Alcohol Use: 0.0 oz/week    0 Standard drinks or equivalent per week     Comment: wine twice weekly  . Drug Use: No  . Sexual Activity: Not on file   Other Topics Concern  . Not on file   Social History Narrative     Current outpatient prescriptions:  .  Adalimumab (HUMIRA PEN) 40 MG/0.8ML PNKT, Inject into the skin., Disp: , Rfl:  .  albuterol (PROVENTIL) (2.5 MG/3ML) 0.083% nebulizer solution, Inhale  2 puffs into the lungs every 6 (six) hours as needed., Disp: , Rfl:  .  amLODipine (NORVASC) 5 MG tablet, Take 1 tablet (5 mg total) by mouth daily., Disp: 30 tablet, Rfl: 12 .  budesonide-formoterol (SYMBICORT) 160-4.5 MCG/ACT inhaler, Inhale 1 puff into the lungs daily., Disp: , Rfl:  .  calcium-vitamin D (CALCIUM 500/D) 500-200 MG-UNIT tablet, Take 1 tablet by mouth daily., Disp: , Rfl:  .  escitalopram (LEXAPRO) 20 MG tablet, Take 1 tablet daily, Disp: 30 tablet, Rfl: 12 .  fluticasone (FLONASE) 50 MCG/ACT nasal spray, Place 2 sprays into the nose daily as needed., Disp: , Rfl:  .  folic acid (FOLVITE) 1 MG tablet, Take 1 tablet by mouth daily., Disp: , Rfl:  .  gabapentin (NEURONTIN) 300 MG capsule, Take 1 capsule (300 mg total) by mouth 3 (three) times daily., Disp: 90 capsule, Rfl: 6 .  losartan-hydrochlorothiazide (HYZAAR) 100-25 MG tablet, Take 1 tablet by mouth daily., Disp: 30 tablet, Rfl: 12 .  Melatonin 10 MG TABS, Take 2 tablets by mouth at bedtime. Reported on 06/03/2015, Disp: , Rfl:  .  montelukast (  SINGULAIR) 10 MG tablet, Take 1 tablet (10 mg total) by mouth daily., Disp: 30 tablet, Rfl: 12 .  Multiple Vitamin (MULTI-VITAMINS) TABS, Take 1 tablet by mouth daily., Disp: , Rfl:  .  Omega 3 1000 MG CAPS, Take 1,000 mg by mouth. omega-3 fatty acids-vitamin E (FISH OIL) 1,000 mg, Disp: , Rfl:  .  simvastatin (ZOCOR) 20 MG tablet, Take 1 tablet every other night. (Patient taking differently: Take 20 mg by mouth daily at 6 PM. Take 1 tablet every night.), Disp: 30 tablet, Rfl: 12  Allergies  Allergen Reactions  . Cyclobenzaprine     Other reaction(s): Other (See Comments) Frequent urination  . Flucytosine Other (See Comments)    Sore mouth  . Fluticasone-Salmeterol     Other reaction(s): Other (See Comments) Sore mouth  . Naproxen Nausea Only    GI upset  . Other Other (See Comments)    Other reaction(s): Other (See Comments) Frequent urination Frequent urination      Review of Systems  Constitutional: Negative for fever, chills, weight loss and malaise/fatigue.  HENT: Negative for hearing loss.   Eyes: Negative for blurred vision and double vision.  Respiratory: Negative for cough, shortness of breath and wheezing.   Cardiovascular: Negative for chest pain, palpitations and leg swelling.  Gastrointestinal: Negative for heartburn, abdominal pain and blood in stool.  Genitourinary: Negative for dysuria, urgency and frequency.  Skin: Negative for rash.  Neurological: Negative for weakness and headaches.  Psychiatric/Behavioral: The patient is nervous/anxious and has insomnia.       Objective  Filed Vitals:   09/10/15 0826 09/10/15 0857  BP: 139/80 130/70  Pulse: 67   Temp: 97.6 F (36.4 C)   TempSrc: Oral   Resp: 16   Height: 5\' 2"  (1.575 m)   Weight: 147 lb (66.679 kg)     Physical Exam  Constitutional: She is oriented to person, place, and time and well-developed, well-nourished, and in no distress. No distress.  HENT:  Head: Normocephalic and atraumatic.  Eyes: Conjunctivae and EOM are normal. Pupils are equal, round, and reactive to light. No scleral icterus.  Neck: Normal range of motion. Neck supple. Carotid bruit is not present. No thyromegaly present.  Cardiovascular: Normal rate, regular rhythm and normal heart sounds.  Exam reveals no gallop and no friction rub.   No murmur heard. Pulmonary/Chest: Effort normal and breath sounds normal. No respiratory distress. She has no wheezes. She exhibits no tenderness.  Abdominal: Soft. Bowel sounds are normal. She exhibits no distension, no abdominal bruit and no mass. There is no tenderness.  Musculoskeletal: She exhibits no edema.  Lymphadenopathy:    She has no cervical adenopathy.  Neurological: She is alert and oriented to person, place, and time.  Psychiatric: Mood, memory, affect and judgment normal.  Vitals reviewed.      No results found for this or any previous  visit (from the past 2160 hour(s)).   Assessment & Plan  Problem List Items Addressed This Visit      Cardiovascular and Mediastinum   BP (high blood pressure) - Primary     Respiratory   CAFL (chronic airflow limitation) (HCC)     Nervous and Auditory   Cervical radiculitis   Relevant Medications   escitalopram (LEXAPRO) 20 MG tablet     Musculoskeletal and Integument   DDD (degenerative disc disease), cervical   Rheumatoid arthritis (HCC)     Other   Depression   Relevant Medications   escitalopram (LEXAPRO) 20 MG tablet  Insomnia   Relevant Medications   escitalopram (LEXAPRO) 20 MG tablet      Meds ordered this encounter  Medications  . escitalopram (LEXAPRO) 20 MG tablet    Sig: Take 1 tablet daily    Dispense:  30 tablet    Refill:  12  1. Essential hypertension Cont Hyzaar and Amlodipine.  2. Panlobular emphysema (HCC) Cont inhalers  3. Cervical radiculitis Cont Gabapentin 4. DDD (degenerative disc disease), cervical   5. Rheumatoid arthritis involving multiple sites, unspecified rheumatoid factor presence ([place])   6. Insomnia  - escitalopram (LEXAPRO) 20 MG tablet; Take 1 tablet daily  Dispense: 30 tablet; Refill: 12  7. Depression  - escitalopram (LEXAPRO) 20 MG tablet; Take 1 tablet daily  Dispense: 30 tablet; Refill: 12

## 2015-11-28 ENCOUNTER — Other Ambulatory Visit: Payer: Self-pay | Admitting: Family Medicine

## 2015-11-28 DIAGNOSIS — I10 Essential (primary) hypertension: Secondary | ICD-10-CM

## 2015-11-28 DIAGNOSIS — J302 Other seasonal allergic rhinitis: Secondary | ICD-10-CM

## 2015-12-05 ENCOUNTER — Encounter: Payer: Self-pay | Admitting: Family Medicine

## 2015-12-05 ENCOUNTER — Encounter: Payer: Self-pay | Admitting: *Deleted

## 2015-12-05 ENCOUNTER — Ambulatory Visit (INDEPENDENT_AMBULATORY_CARE_PROVIDER_SITE_OTHER): Payer: Medicare Other | Admitting: Family Medicine

## 2015-12-05 VITALS — BP 140/70 | HR 60 | Temp 98.3°F | Resp 16 | Ht 62.0 in | Wt 149.0 lb

## 2015-12-05 DIAGNOSIS — I6529 Occlusion and stenosis of unspecified carotid artery: Secondary | ICD-10-CM

## 2015-12-05 DIAGNOSIS — Z1231 Encounter for screening mammogram for malignant neoplasm of breast: Secondary | ICD-10-CM

## 2015-12-05 DIAGNOSIS — M5116 Intervertebral disc disorders with radiculopathy, lumbar region: Secondary | ICD-10-CM

## 2015-12-05 DIAGNOSIS — F32A Depression, unspecified: Secondary | ICD-10-CM

## 2015-12-05 DIAGNOSIS — J431 Panlobular emphysema: Secondary | ICD-10-CM

## 2015-12-05 DIAGNOSIS — E784 Other hyperlipidemia: Secondary | ICD-10-CM | POA: Diagnosis not present

## 2015-12-05 DIAGNOSIS — E7849 Other hyperlipidemia: Secondary | ICD-10-CM

## 2015-12-05 DIAGNOSIS — F329 Major depressive disorder, single episode, unspecified: Secondary | ICD-10-CM | POA: Diagnosis not present

## 2015-12-05 DIAGNOSIS — I10 Essential (primary) hypertension: Secondary | ICD-10-CM

## 2015-12-05 DIAGNOSIS — M069 Rheumatoid arthritis, unspecified: Secondary | ICD-10-CM

## 2015-12-05 DIAGNOSIS — Z1239 Encounter for other screening for malignant neoplasm of breast: Secondary | ICD-10-CM

## 2015-12-05 DIAGNOSIS — Z23 Encounter for immunization: Secondary | ICD-10-CM

## 2015-12-05 MED ORDER — BUDESONIDE-FORMOTEROL FUMARATE 160-4.5 MCG/ACT IN AERO: 1.0000 | INHALATION_SPRAY | Freq: Two times a day (BID) | RESPIRATORY_TRACT | 12 refills | Status: DC

## 2015-12-05 NOTE — Progress Notes (Signed)
Name: Elizabeth Bailey   MRN: [id number]    DOB: 1941/02/12   Date:12/05/2015       Progress Note  Subjective  Chief Complaint  Chief Complaint  Patient presents with  . Follow-up    BP    HPI Here for f/u of HBP and COPD.  She has RA and neuritis.  She feels pretty well overall.   No problem-specific Assessment & Plan notes found for this encounter.   Past Medical History:  Diagnosis Date  . Arthritis   . Asthma   . Chronic bronchitis ([place])   . Depression   . Diverticulosis   . GERD (gastroesophageal reflux disease)   . High cholesterol   . Hypertension   . Seasonal allergies     Past Surgical History:  Procedure Laterality Date  . ABDOMINAL HYSTERECTOMY    . BREAST BIOPSY    . COLONOSCOPY  2005  . DG  BONE DENSITY ([place] HX)  2010  . DIAGNOSTIC MAMMOGRAM  2015  . GALLBLADDER SURGERY    . pap smear  2014    Family History  Problem Relation Age of Onset  . Pneumonia Mother   . Depression Mother   . Depression Sister   . Stroke Brother   . Heart disease Brother     Social History   Social History  . Marital status: Married    Spouse name: N/A  . Number of children: N/A  . Years of education: N/A   Occupational History  . Not on file.   Social History Main Topics  . Smoking status: Former Smoker    Packs/day: 0.50    Years: 10.00    Types: Cigarettes  . Smokeless tobacco: Never Used     Comment: quit 1988  . Alcohol use 0.0 oz/week     Comment: wine twice a month   . Drug use: No  . Sexual activity: No   Other Topics Concern  . Not on file   Social History Narrative  . No narrative on file     Current Outpatient Prescriptions:  .  Adalimumab (HUMIRA PEN) 40 MG/0.8ML PNKT, Inject into the skin., Disp: , Rfl:  .  albuterol (PROVENTIL) (2.5 MG/3ML) 0.083% nebulizer solution, Inhale 2 puffs into the lungs every 6 (six) hours as needed., Disp: , Rfl:  .  amLODipine (NORVASC) 5 MG tablet, TAKE 1 TABLET BY MOUTH ONCE DAILY., Disp: 30 tablet, Rfl:  12 .  budesonide-formoterol (SYMBICORT) 160-4.5 MCG/ACT inhaler, Inhale 1 puff into the lungs 2 (two) times daily., Disp: 1 Inhaler, Rfl: 12 .  calcium-vitamin D (CALCIUM 500/D) 500-200 MG-UNIT tablet, Take 1 tablet by mouth daily., Disp: , Rfl:  .  escitalopram (LEXAPRO) 20 MG tablet, Take 1 tablet daily, Disp: 30 tablet, Rfl: 12 .  fluticasone (FLONASE) 50 MCG/ACT nasal spray, Place 2 sprays into the nose daily as needed., Disp: , Rfl:  .  folic acid (FOLVITE) 1 MG tablet, Take 1 tablet by mouth daily., Disp: , Rfl:  .  losartan-hydrochlorothiazide (HYZAAR) 100-25 MG tablet, TAKE 1 TABLET BY MOUTH ONCE DAILY., Disp: 30 tablet, Rfl: 12 .  Melatonin 10 MG TABS, Take 2 tablets by mouth at bedtime. Reported on 06/03/2015, Disp: , Rfl:  .  montelukast (SINGULAIR) 10 MG tablet, TAKE 1 TABLET BY MOUTH ONCE DAILY., Disp: 30 tablet, Rfl: 12 .  Multiple Vitamin (MULTI-VITAMINS) TABS, Take 1 tablet by mouth daily., Disp: , Rfl:  .  Omega 3 1000 MG CAPS, Take 1,000 mg by mouth.  omega-3 fatty acids-vitamin E (FISH OIL) 1,000 mg, Disp: , Rfl:  .  simvastatin (ZOCOR) 20 MG tablet, Take 1 tablet every other night. (Patient taking differently: Take 20 mg by mouth daily at 6 PM. Take 1 tablet every night.), Disp: 30 tablet, Rfl: 12 .  HYDROcodone-acetaminophen (NORCO/VICODIN) 5-325 MG tablet, Take 1 tablet by mouth daily as needed., Disp: , Rfl:   Allergies  Allergen Reactions  . Cyclobenzaprine     Other reaction(s): Other (See Comments) Frequent urination  . Flucytosine Other (See Comments)    Sore mouth  . Fluticasone-Salmeterol     Other reaction(s): Other (See Comments) Sore mouth  . Naproxen Nausea Only    GI upset  . Other Other (See Comments)    Other reaction(s): Other (See Comments) Frequent urination Other reaction(s): Other (See Comments) Frequent urination Frequent urination     Review of Systems  Constitutional: Negative for chills, fever, malaise/fatigue and weight loss.  HENT:  Positive for nosebleeds. Negative for hearing loss.   Eyes: Negative for blurred vision and double vision.  Respiratory: Positive for cough (occ.) and wheezing. Negative for shortness of breath ( comes and goes).   Cardiovascular: Negative for chest pain, palpitations and leg swelling.  Gastrointestinal: Negative for abdominal pain, blood in stool and heartburn.  Genitourinary: Negative for dysuria, frequency and urgency.  Musculoskeletal: Negative for myalgias. Joint pain: /-  Skin: Negative for rash.  Neurological: Negative for dizziness, tremors, weakness and headaches.      Objective  Vitals:   12/05/15 0837 12/05/15 0843 12/05/15 0938  BP: (!) 150/70 126/72 140/70  Pulse: 66 60   Resp: 16    Temp: 98.3 F (36.8 C)    TempSrc: Oral    Weight: 67.6 kg (149 lb)    Height: 5\' 2"  (1.575 m)      Physical Exam  Constitutional: She is oriented to person, place, and time and well-developed, well-nourished, and in no distress. No distress.  HENT:  Head: Normocephalic and atraumatic.  Right Ear: External ear normal.  Left Ear: External ear normal.  Mouth/Throat: Oropharynx is clear and moist.  Eyes: Conjunctivae and EOM are normal. Pupils are equal, round, and reactive to light. No scleral icterus.  Neck: Normal range of motion. Neck supple. No thyromegaly present.  Cardiovascular: Normal rate, regular rhythm and normal heart sounds.  Exam reveals no gallop and no friction rub.   No murmur heard. Pulmonary/Chest: Effort normal and breath sounds normal. No respiratory distress. She has no wheezes. She has no rales.  Musculoskeletal: She exhibits no edema.  Lymphadenopathy:    She has no cervical adenopathy.  Neurological: She is alert and oriented to person, place, and time.  Vitals reviewed.      No results found for this or any previous visit (from the past 2160 hour(s)).   Assessment & Plan  Problem List Items Addressed This Visit      Cardiovascular and  Mediastinum   BP (high blood pressure)   Carotid artery stenosis     Respiratory   CAFL (chronic airflow limitation) (HCC)   Relevant Medications   budesonide-formoterol (SYMBICORT) 160-4.5 MCG/ACT inhaler     Nervous and Auditory   Neuritis or radiculitis due to rupture of lumbar intervertebral disc     Musculoskeletal and Integument   Rheumatoid arthritis (HCC)   Relevant Medications   HYDROcodone-acetaminophen (NORCO/VICODIN) 5-325 MG tablet     Other   Depression   Hyperlipidemia    Other Visit Diagnoses  Need for vaccination    -  Primary   Relevant Orders   Flu vaccine HIGH DOSE PF (Fluzone High dose)      Meds ordered this encounter  Medications  . HYDROcodone-acetaminophen (NORCO/VICODIN) 5-325 MG tablet    Sig: Take 1 tablet by mouth daily as needed.  . budesonide-formoterol (SYMBICORT) 160-4.5 MCG/ACT inhaler    Sig: Inhale 1 puff into the lungs 2 (two) times daily.    Dispense:  1 Inhaler    Refill:  12   1. Need for vaccination  - Flu vaccine HIGH DOSE PF (Fluzone High dose)  2. Essential hypertension Cont meds  3. Stenosis of carotid artery, unspecified laterality   4. Panlobular emphysema (HCC) Cont Albuterol - budesonide-formoterol (SYMBICORT) 160-4.5 MCG/ACT inhaler; Inhale 1 puff into the lungs 2 (two) times daily.  Dispense: 1 Inhaler; Refill: 12  5. Neuritis or radiculitis due to rupture of lumbar intervertebral disc  Cont to se Dr. Sharlet Salina 6. Rheumatoid arthritis involving multiple sites, unspecified rheumatoid factor presence ([place]) Cont to see Rheumatology  7. Depression, unspecified depression type Cont med  8. Other hyperlipidemia

## 2015-12-09 ENCOUNTER — Other Ambulatory Visit: Payer: Self-pay | Admitting: Family Medicine

## 2015-12-09 DIAGNOSIS — Z1231 Encounter for screening mammogram for malignant neoplasm of breast: Secondary | ICD-10-CM

## 2015-12-31 ENCOUNTER — Ambulatory Visit
Admission: RE | Admit: 2015-12-31 | Discharge: 2015-12-31 | Disposition: A | Discharge status: Home / Self Care | Payer: Medicare Other | Source: Ambulatory Visit | Attending: Family Medicine | Admitting: Family Medicine

## 2015-12-31 ENCOUNTER — Other Ambulatory Visit: Payer: Self-pay | Admitting: Family Medicine

## 2015-12-31 DIAGNOSIS — Z1231 Encounter for screening mammogram for malignant neoplasm of breast: Secondary | ICD-10-CM

## 2015-12-31 DIAGNOSIS — R928 Other abnormal and inconclusive findings on diagnostic imaging of breast: Secondary | ICD-10-CM | POA: Insufficient documentation

## 2016-01-01 ENCOUNTER — Other Ambulatory Visit: Payer: Self-pay | Admitting: Family Medicine

## 2016-01-01 DIAGNOSIS — N6489 Other specified disorders of breast: Secondary | ICD-10-CM

## 2016-01-16 ENCOUNTER — Ambulatory Visit
Admission: RE | Admit: 2016-01-16 | Discharge: 2016-01-16 | Disposition: A | Discharge status: Home / Self Care | Payer: Medicare Other | Source: Ambulatory Visit | Attending: Family Medicine | Admitting: Family Medicine

## 2016-01-16 DIAGNOSIS — N6312 Unspecified lump in the right breast, upper inner quadrant: Secondary | ICD-10-CM | POA: Insufficient documentation

## 2016-01-16 DIAGNOSIS — N6489 Other specified disorders of breast: Secondary | ICD-10-CM

## 2016-01-16 DIAGNOSIS — R920 Mammographic microcalcification found on diagnostic imaging of breast: Secondary | ICD-10-CM | POA: Insufficient documentation

## 2016-01-16 DIAGNOSIS — N6042 Mammary duct ectasia of left breast: Secondary | ICD-10-CM | POA: Insufficient documentation

## 2016-01-16 DIAGNOSIS — N6002 Solitary cyst of left breast: Secondary | ICD-10-CM | POA: Insufficient documentation

## 2016-01-17 ENCOUNTER — Other Ambulatory Visit: Payer: Self-pay | Admitting: Family Medicine

## 2016-01-17 DIAGNOSIS — N6310 Unspecified lump in the right breast, unspecified quadrant: Secondary | ICD-10-CM

## 2016-01-20 ENCOUNTER — Other Ambulatory Visit: Payer: Self-pay | Admitting: Family Medicine

## 2016-01-20 ENCOUNTER — Encounter: Payer: Self-pay | Admitting: *Deleted

## 2016-01-20 DIAGNOSIS — N6310 Unspecified lump in the right breast, unspecified quadrant: Secondary | ICD-10-CM

## 2016-01-24 ENCOUNTER — Encounter
Admission: EM | Disposition: A | Discharge status: Skilled Nursing Facility | Payer: Self-pay | Source: Home / Self Care | Attending: Surgery

## 2016-01-24 ENCOUNTER — Emergency Department: Payer: Medicare Other | Admitting: Anesthesiology

## 2016-01-24 ENCOUNTER — Emergency Department: Payer: Medicare Other

## 2016-01-24 ENCOUNTER — Inpatient Hospital Stay
Admission: EM | Admit: 2016-01-24 | Discharge: 2016-01-31 | DRG: 420 | Disposition: A | Discharge status: Skilled Nursing Facility | Payer: Medicare Other | Attending: Surgery | Admitting: Surgery

## 2016-01-24 ENCOUNTER — Encounter: Payer: Self-pay | Admitting: Emergency Medicine

## 2016-01-24 DIAGNOSIS — E876 Hypokalemia: Secondary | ICD-10-CM | POA: Diagnosis not present

## 2016-01-24 DIAGNOSIS — K567 Ileus, unspecified: Secondary | ICD-10-CM | POA: Diagnosis not present

## 2016-01-24 DIAGNOSIS — M069 Rheumatoid arthritis, unspecified: Secondary | ICD-10-CM | POA: Diagnosis present

## 2016-01-24 DIAGNOSIS — F329 Major depressive disorder, single episode, unspecified: Secondary | ICD-10-CM | POA: Diagnosis present

## 2016-01-24 DIAGNOSIS — Z87891 Personal history of nicotine dependence: Secondary | ICD-10-CM | POA: Diagnosis not present

## 2016-01-24 DIAGNOSIS — I10 Essential (primary) hypertension: Secondary | ICD-10-CM | POA: Diagnosis present

## 2016-01-24 DIAGNOSIS — K219 Gastro-esophageal reflux disease without esophagitis: Secondary | ICD-10-CM | POA: Diagnosis present

## 2016-01-24 DIAGNOSIS — Z818 Family history of other mental and behavioral disorders: Secondary | ICD-10-CM | POA: Diagnosis not present

## 2016-01-24 DIAGNOSIS — K5790 Diverticulosis of intestine, part unspecified, without perforation or abscess without bleeding: Secondary | ICD-10-CM | POA: Diagnosis present

## 2016-01-24 DIAGNOSIS — K251 Acute gastric ulcer with perforation: Secondary | ICD-10-CM | POA: Insufficient documentation

## 2016-01-24 DIAGNOSIS — I493 Ventricular premature depolarization: Secondary | ICD-10-CM | POA: Diagnosis present

## 2016-01-24 DIAGNOSIS — M5030 Other cervical disc degeneration, unspecified cervical region: Secondary | ICD-10-CM | POA: Diagnosis present

## 2016-01-24 DIAGNOSIS — E861 Hypovolemia: Secondary | ICD-10-CM | POA: Diagnosis present

## 2016-01-24 DIAGNOSIS — Z7951 Long term (current) use of inhaled steroids: Secondary | ICD-10-CM

## 2016-01-24 DIAGNOSIS — K869 Disease of pancreas, unspecified: Secondary | ICD-10-CM | POA: Diagnosis not present

## 2016-01-24 DIAGNOSIS — E860 Dehydration: Secondary | ICD-10-CM | POA: Diagnosis present

## 2016-01-24 DIAGNOSIS — Z8249 Family history of ischemic heart disease and other diseases of the circulatory system: Secondary | ICD-10-CM

## 2016-01-24 DIAGNOSIS — Z888 Allergy status to other drugs, medicaments and biological substances status: Secondary | ICD-10-CM

## 2016-01-24 DIAGNOSIS — Z833 Family history of diabetes mellitus: Secondary | ICD-10-CM

## 2016-01-24 DIAGNOSIS — E871 Hypo-osmolality and hyponatremia: Secondary | ICD-10-CM | POA: Diagnosis not present

## 2016-01-24 DIAGNOSIS — Z803 Family history of malignant neoplasm of breast: Secondary | ICD-10-CM | POA: Diagnosis not present

## 2016-01-24 DIAGNOSIS — R1013 Epigastric pain: Secondary | ICD-10-CM | POA: Diagnosis present

## 2016-01-24 DIAGNOSIS — I700 Atherosclerosis of aorta: Secondary | ICD-10-CM | POA: Diagnosis present

## 2016-01-24 DIAGNOSIS — J449 Chronic obstructive pulmonary disease, unspecified: Secondary | ICD-10-CM | POA: Diagnosis present

## 2016-01-24 DIAGNOSIS — K265 Chronic or unspecified duodenal ulcer with perforation: Secondary | ICD-10-CM

## 2016-01-24 DIAGNOSIS — M5416 Radiculopathy, lumbar region: Secondary | ICD-10-CM | POA: Diagnosis present

## 2016-01-24 DIAGNOSIS — N179 Acute kidney failure, unspecified: Secondary | ICD-10-CM | POA: Diagnosis present

## 2016-01-24 DIAGNOSIS — Z823 Family history of stroke: Secondary | ICD-10-CM

## 2016-01-24 DIAGNOSIS — M6281 Muscle weakness (generalized): Secondary | ICD-10-CM

## 2016-01-24 DIAGNOSIS — E7800 Pure hypercholesterolemia, unspecified: Secondary | ICD-10-CM | POA: Diagnosis present

## 2016-01-24 DIAGNOSIS — K8689 Other specified diseases of pancreas: Secondary | ICD-10-CM

## 2016-01-24 HISTORY — PX: PANCREAS SURGERY: SHX731

## 2016-01-24 HISTORY — PX: REPAIR OF PERFORATED ULCER: SHX6065

## 2016-01-24 HISTORY — DX: Systemic involvement of connective tissue, unspecified: M35.9

## 2016-01-24 LAB — BASIC METABOLIC PANEL
ANION GAP: 10 (ref 5–15)
BUN: 10 mg/dL (ref 6–20)
CALCIUM: 8.3 mg/dL — AB (ref 8.9–10.3)
CO2: 25 mmol/L (ref 22–32)
Chloride: 90 mmol/L — ABNORMAL LOW (ref 101–111)
Creatinine, Ser: 1.05 mg/dL — ABNORMAL HIGH (ref 0.44–1.00)
GFR, EST AFRICAN AMERICAN: 59 mL/min — AB (ref >60)
GFR, EST NON AFRICAN AMERICAN: 51 mL/min — AB (ref >60)
GLUCOSE: 171 mg/dL — AB (ref 65–99)
Potassium: 3.5 mmol/L (ref 3.5–5.1)
Sodium: 125 mmol/L — ABNORMAL LOW (ref 135–145)

## 2016-01-24 LAB — CBC WITH DIFFERENTIAL/PLATELET
BASOS ABS: 0.1 10*3/uL (ref 0–0.1)
BASOS PCT: 1 %
EOS PCT: 1 %
Eosinophils Absolute: 0.1 10*3/uL (ref 0–0.7)
HEMATOCRIT: 35 % (ref 35.0–47.0)
Hemoglobin: 12.2 g/dL (ref 12.0–16.0)
LYMPHS PCT: 17 %
Lymphs Abs: 1.8 10*3/uL (ref 1.0–3.6)
MCH: 32.6 pg (ref 26.0–34.0)
MCHC: 35 g/dL (ref 32.0–36.0)
MCV: 93.2 fL (ref 80.0–100.0)
MONO ABS: 0.9 10*3/uL (ref 0.2–0.9)
Monocytes Relative: 8 %
NEUTROS ABS: 7.9 10*3/uL — AB (ref 1.4–6.5)
Neutrophils Relative %: 73 %
PLATELETS: 484 10*3/uL — AB (ref 150–440)
RBC: 3.75 MIL/uL — AB (ref 3.80–5.20)
RDW: 12.3 % (ref 11.5–14.5)
WBC: 10.7 10*3/uL (ref 3.6–11.0)

## 2016-01-24 LAB — MAGNESIUM: Magnesium: 1.5 mg/dL — ABNORMAL LOW (ref 1.7–2.4)

## 2016-01-24 LAB — URINALYSIS COMPLETE WITH MICROSCOPIC (ARMC ONLY)
BACTERIA UA: NONE SEEN
Bilirubin Urine: NEGATIVE
GLUCOSE, UA: NEGATIVE mg/dL
HGB URINE DIPSTICK: NEGATIVE
KETONES UR: NEGATIVE mg/dL
NITRITE: NEGATIVE
Protein, ur: NEGATIVE mg/dL
SPECIFIC GRAVITY, URINE: 1.015 (ref 1.005–1.030)
pH: 6 (ref 5.0–8.0)

## 2016-01-24 LAB — TROPONIN I: Troponin I: <0.03 ng/mL (ref <0.03)

## 2016-01-24 LAB — LIPASE, BLOOD: Lipase: 18 U/L (ref 11–51)

## 2016-01-24 SURGERY — REPAIR, ULCER, PEPTIC, PERFORATED
Anesthesia: General

## 2016-01-24 SURGERY — REPAIR, ULCER, PEPTIC, PERFORATED
Anesthesia: General | Site: Abdomen | Wound class: Clean

## 2016-01-24 MED ORDER — ONDANSETRON HCL 4 MG PO TABS: 4.0000 mg | ORAL_TABLET | Freq: Four times a day (QID) | ORAL | Status: DC | PRN

## 2016-01-24 MED ORDER — BUPIVACAINE-EPINEPHRINE (PF) 0.5% -1:200000 IJ SOLN
INTRAMUSCULAR | Status: DC | PRN
  Administered 2016-01-24: 10 mL via PERINEURAL

## 2016-01-24 MED ORDER — MORPHINE SULFATE (PF) 4 MG/ML IV SOLN
INTRAVENOUS | Status: AC
  Filled 2016-01-24: qty 1

## 2016-01-24 MED ORDER — FENTANYL CITRATE (PF) 100 MCG/2ML IJ SOLN
25.0000 ug | INTRAMUSCULAR | Status: AC | PRN
  Administered 2016-01-24 (×6): 25 ug via INTRAVENOUS

## 2016-01-24 MED ORDER — ONDANSETRON HCL 4 MG/2ML IJ SOLN: 4.0000 mg | Freq: Four times a day (QID) | INTRAMUSCULAR | Status: DC | PRN

## 2016-01-24 MED ORDER — ACETAMINOPHEN 10 MG/ML IV SOLN
INTRAVENOUS | Status: DC | PRN
Start: 2016-01-24 — End: 2016-01-24
  Administered 2016-01-24: 1000 mg via INTRAVENOUS

## 2016-01-24 MED ORDER — ROCURONIUM BROMIDE 100 MG/10ML IV SOLN
INTRAVENOUS | Status: DC | PRN
  Administered 2016-01-24: 20 mg via INTRAVENOUS

## 2016-01-24 MED ORDER — LOSARTAN POTASSIUM-HCTZ 100-25 MG PO TABS: 1.0000 | ORAL_TABLET | Freq: Every day | ORAL | Status: DC

## 2016-01-24 MED ORDER — PANTOPRAZOLE SODIUM 40 MG IV SOLR
40.0000 mg | Freq: Two times a day (BID) | INTRAVENOUS | Status: DC
  Administered 2016-01-24 – 2016-01-29 (×10): 40 mg via INTRAVENOUS
  Filled 2016-01-24 (×11): qty 40

## 2016-01-24 MED ORDER — HYDROCHLOROTHIAZIDE 25 MG PO TABS
25.0000 mg | ORAL_TABLET | Freq: Every day | ORAL | Status: DC
  Administered 2016-01-24: 25 mg via ORAL
  Filled 2016-01-24: qty 1

## 2016-01-24 MED ORDER — ONDANSETRON HCL 4 MG/2ML IJ SOLN
4.0000 mg | Freq: Once | INTRAMUSCULAR | Status: AC
  Administered 2016-01-24: 4 mg via INTRAVENOUS

## 2016-01-24 MED ORDER — MAGNESIUM SULFATE 2 GM/50ML IV SOLN
2.0000 g | Freq: Once | INTRAVENOUS | Status: AC
  Administered 2016-01-24: 2 g via INTRAVENOUS
  Filled 2016-01-24: qty 50

## 2016-01-24 MED ORDER — EPHEDRINE SULFATE 50 MG/ML IJ SOLN
INTRAMUSCULAR | Status: DC | PRN
  Administered 2016-01-24: 5 mg via INTRAVENOUS

## 2016-01-24 MED ORDER — SODIUM CHLORIDE 0.9 % IV SOLN
1.0000 g | Freq: Once | INTRAVENOUS | Status: AC
  Administered 2016-01-24: 1 g via INTRAVENOUS
  Filled 2016-01-24: qty 10

## 2016-01-24 MED ORDER — POTASSIUM CHLORIDE 10 MEQ/100ML PEDIATRIC IV SOLN: 10.0000 meq | INTRAVENOUS | Status: DC

## 2016-01-24 MED ORDER — SUGAMMADEX SODIUM 200 MG/2ML IV SOLN
INTRAVENOUS | Status: DC | PRN
  Administered 2016-01-24: 150 mg via INTRAVENOUS

## 2016-01-24 MED ORDER — PROPOFOL 10 MG/ML IV BOLUS
INTRAVENOUS | Status: DC | PRN
  Administered 2016-01-24: 130 mg via INTRAVENOUS

## 2016-01-24 MED ORDER — LOSARTAN POTASSIUM 50 MG PO TABS
100.0000 mg | ORAL_TABLET | Freq: Every day | ORAL | Status: DC
  Administered 2016-01-24 – 2016-01-31 (×8): 100 mg via ORAL
  Filled 2016-01-24 (×9): qty 2

## 2016-01-24 MED ORDER — IOPAMIDOL (ISOVUE-300) INJECTION 61%
30.0000 mL | Freq: Once | INTRAVENOUS | Status: AC | PRN
  Administered 2016-01-24: 30 mL via ORAL

## 2016-01-24 MED ORDER — MIDAZOLAM HCL 2 MG/2ML IJ SOLN
INTRAMUSCULAR | Status: DC | PRN
  Administered 2016-01-24: 1 mg via INTRAVENOUS

## 2016-01-24 MED ORDER — MAGNESIUM SULFATE 2 GM/50ML IV SOLN
2.0000 g | Freq: Once | INTRAVENOUS | Status: DC
  Filled 2016-01-24: qty 50

## 2016-01-24 MED ORDER — HEPARIN SODIUM (PORCINE) 5000 UNIT/ML IJ SOLN
5000.0000 [IU] | Freq: Three times a day (TID) | INTRAMUSCULAR | Status: DC
  Administered 2016-01-25 – 2016-01-31 (×18): 5000 [IU] via SUBCUTANEOUS
  Filled 2016-01-24 (×18): qty 1

## 2016-01-24 MED ORDER — PHENYLEPHRINE HCL 10 MG/ML IJ SOLN
INTRAMUSCULAR | Status: DC | PRN
Start: 2016-01-24 — End: 2016-01-24
  Administered 2016-01-24: 100 ug via INTRAVENOUS

## 2016-01-24 MED ORDER — SODIUM CHLORIDE 0.9 % IV SOLN
Freq: Once | INTRAVENOUS | Status: AC
  Administered 2016-01-24: 700 mL via INTRAVENOUS
  Filled 2016-01-24: qty 150

## 2016-01-24 MED ORDER — LACTATED RINGERS IV SOLN
INTRAVENOUS | Status: DC | PRN
  Administered 2016-01-24: 16:00:00 via INTRAVENOUS

## 2016-01-24 MED ORDER — ONDANSETRON HCL 4 MG/2ML IJ SOLN: 4.0000 mg | Freq: Once | INTRAMUSCULAR | Status: DC | PRN

## 2016-01-24 MED ORDER — POTASSIUM CHLORIDE IN NACL 20-0.9 MEQ/L-% IV SOLN
Freq: Once | INTRAVENOUS | Status: AC
  Administered 2016-01-24: 15:00:00 via INTRAVENOUS
  Filled 2016-01-24: qty 1000

## 2016-01-24 MED ORDER — CEFAZOLIN SODIUM-DEXTROSE 2-4 GM/100ML-% IV SOLN
2.0000 g | Freq: Once | INTRAVENOUS | Status: AC
  Administered 2016-01-24: 2 g via INTRAVENOUS
  Filled 2016-01-24: qty 100

## 2016-01-24 MED ORDER — MORPHINE SULFATE (PF) 4 MG/ML IV SOLN
2.0000 mg | INTRAVENOUS | Status: DC | PRN
  Administered 2016-01-24 – 2016-01-25 (×7): 2 mg via INTRAVENOUS
  Filled 2016-01-24 (×7): qty 1

## 2016-01-24 MED ORDER — CEFAZOLIN IN D5W 1 GM/50ML IV SOLN
1.0000 g | Freq: Four times a day (QID) | INTRAVENOUS | Status: DC
  Administered 2016-01-24 – 2016-01-29 (×17): 1 g via INTRAVENOUS
  Filled 2016-01-24 (×22): qty 50

## 2016-01-24 MED ORDER — SUCCINYLCHOLINE CHLORIDE 20 MG/ML IJ SOLN
INTRAMUSCULAR | Status: DC | PRN
  Administered 2016-01-24: 120 mg via INTRAVENOUS

## 2016-01-24 MED ORDER — MONTELUKAST SODIUM 10 MG PO TABS
10.0000 mg | ORAL_TABLET | Freq: Every day | ORAL | Status: DC
  Administered 2016-01-24 – 2016-01-31 (×8): 10 mg via ORAL
  Filled 2016-01-24 (×9): qty 1

## 2016-01-24 MED ORDER — MOMETASONE FURO-FORMOTEROL FUM 200-5 MCG/ACT IN AERO
2.0000 | INHALATION_SPRAY | Freq: Two times a day (BID) | RESPIRATORY_TRACT | Status: DC
  Administered 2016-01-24 – 2016-01-31 (×14): 2 via RESPIRATORY_TRACT
  Filled 2016-01-24: qty 8.8

## 2016-01-24 MED ORDER — AMLODIPINE BESYLATE 5 MG PO TABS
5.0000 mg | ORAL_TABLET | Freq: Every day | ORAL | Status: DC
  Administered 2016-01-24 – 2016-01-31 (×8): 5 mg via ORAL
  Filled 2016-01-24 (×10): qty 1

## 2016-01-24 MED ORDER — FENTANYL CITRATE (PF) 100 MCG/2ML IJ SOLN
INTRAMUSCULAR | Status: AC
  Administered 2016-01-24: 25 ug via INTRAVENOUS
  Filled 2016-01-24: qty 2

## 2016-01-24 MED ORDER — FLUTICASONE PROPIONATE 50 MCG/ACT NA SUSP
2.0000 | Freq: Every day | NASAL | Status: DC
  Administered 2016-01-24 – 2016-01-30 (×5): 2 via NASAL
  Filled 2016-01-24 (×3): qty 16

## 2016-01-24 MED ORDER — KCL IN DEXTROSE-NACL 40-5-0.9 MEQ/L-%-% IV SOLN
INTRAVENOUS | Status: DC
  Administered 2016-01-24 – 2016-01-26 (×4): via INTRAVENOUS
  Filled 2016-01-24 (×6): qty 1000

## 2016-01-24 MED ORDER — LIDOCAINE HCL (CARDIAC) 20 MG/ML IV SOLN
INTRAVENOUS | Status: DC | PRN
  Administered 2016-01-24: 50 mg via INTRAVENOUS

## 2016-01-24 MED ORDER — MORPHINE SULFATE (PF) 4 MG/ML IV SOLN
4.0000 mg | Freq: Once | INTRAVENOUS | Status: AC
  Administered 2016-01-24: 4 mg via INTRAVENOUS

## 2016-01-24 MED ORDER — HEPARIN SODIUM (PORCINE) 5000 UNIT/ML IJ SOLN
5000.0000 [IU] | Freq: Three times a day (TID) | INTRAMUSCULAR | Status: DC
  Administered 2016-01-24 (×2): 5000 [IU] via SUBCUTANEOUS
  Filled 2016-01-24 (×2): qty 1

## 2016-01-24 MED ORDER — IOPAMIDOL (ISOVUE-370) INJECTION 76%
100.0000 mL | Freq: Once | INTRAVENOUS | Status: AC | PRN
  Administered 2016-01-24: 100 mL via INTRAVENOUS

## 2016-01-24 MED ORDER — DEXAMETHASONE SODIUM PHOSPHATE 10 MG/ML IJ SOLN
INTRAMUSCULAR | Status: DC | PRN
  Administered 2016-01-24: 4 mg via INTRAVENOUS

## 2016-01-24 MED ORDER — FENTANYL CITRATE (PF) 100 MCG/2ML IJ SOLN
INTRAMUSCULAR | Status: DC | PRN
  Administered 2016-01-24: 50 ug via INTRAVENOUS

## 2016-01-24 MED ORDER — ONDANSETRON HCL 4 MG/2ML IJ SOLN
INTRAMUSCULAR | Status: AC
  Filled 2016-01-24: qty 2

## 2016-01-24 SURGICAL SUPPLY — 34 items
CANISTER SUCT 1200ML W/VALVE (MISCELLANEOUS) ×2 IMPLANT
CATH TRAY 16F METER LATEX (MISCELLANEOUS) ×2 IMPLANT
CHLORAPREP W/TINT 26ML (MISCELLANEOUS) ×2 IMPLANT
DRAPE LAPAROTOMY 100X77 ABD (DRAPES) ×2 IMPLANT
DRESSING TELFA 4X3 1S ST N-ADH (GAUZE/BANDAGES/DRESSINGS) ×4 IMPLANT
DRSG OPSITE POSTOP 4X12 (GAUZE/BANDAGES/DRESSINGS) ×2 IMPLANT
DRSG TELFA 3X8 NADH (GAUZE/BANDAGES/DRESSINGS) IMPLANT
ELECT REM PT RETURN 9FT ADLT (ELECTROSURGICAL) ×2
ELECTRODE REM PT RTRN 9FT ADLT (ELECTROSURGICAL) ×1 IMPLANT
GAUZE SPONGE 4X4 12PLY STRL (GAUZE/BANDAGES/DRESSINGS) IMPLANT
GLOVE BIO SURGEON STRL SZ8 (GLOVE) ×2 IMPLANT
GOWN STRL REUS W/ TWL LRG LVL3 (GOWN DISPOSABLE) ×1 IMPLANT
GOWN STRL REUS W/TWL LRG LVL3 (GOWN DISPOSABLE) ×1
JACKSON PRATT 7MM (INSTRUMENTS) ×2 IMPLANT
KIT RM TURNOVER STRD PROC AR (KITS) ×2 IMPLANT
LABEL OR SOLS (LABEL) ×2 IMPLANT
NDL SAFETY 22GX1.5 (NEEDLE) ×2 IMPLANT
NS IRRIG 1000ML POUR BTL (IV SOLUTION) ×2 IMPLANT
PACK BASIN MAJOR ARMC (MISCELLANEOUS) ×2 IMPLANT
PACK COLON CLEAN CLOSURE (MISCELLANEOUS) ×2 IMPLANT
SEPRAFILM MEMBRANE 5X6 (MISCELLANEOUS) IMPLANT
STAPLER SKIN PROX 35W (STAPLE) ×4 IMPLANT
SUT ETHILON 3-0 FS-10 30 BLK (SUTURE) ×2
SUT PDS AB 1 TP1 54 (SUTURE) ×4 IMPLANT
SUT PROLENE 0 CT 1 30 (SUTURE) ×2 IMPLANT
SUT SILK 2 0 SH (SUTURE) ×4 IMPLANT
SUT SILK 3-0 (SUTURE) ×2 IMPLANT
SUT VIC AB 3-0 SH 27 (SUTURE) ×1
SUT VIC AB 3-0 SH 27X BRD (SUTURE) ×1 IMPLANT
SUT VICRYL 2 0 18  UND BR (SUTURE) ×1
SUT VICRYL 2 0 18 UND BR (SUTURE) ×1 IMPLANT
SUTURE EHLN 3-0 FS-10 30 BLK (SUTURE) ×1 IMPLANT
SYRINGE 10CC LL (SYRINGE) ×2 IMPLANT
SYS BIOPSY MAX CORE 14GX10 (NEEDLE) ×2 IMPLANT

## 2016-01-24 NOTE — ED Triage Notes (Signed)
Pt here with c/o rib pain x2 weeks; reports hydrocodone improves pain. Pt reports pain with movement.

## 2016-01-24 NOTE — Consult Note (Signed)
[facility] NAME: Elizabeth Bailey    MR#:  [id number]  DATE OF BIRTH:  1941/01/15  DATE OF ADMISSION:  01/24/2016  PRIMARY CARE PHYSICIAN: Dicky Doe, MD   REQUESTING/REFERRING PHYSICIAN: Dr. Burt Knack  CHIEF COMPLAINT:   Chief Complaint  Patient presents with  . Chest Pain    HISTORY OF PRESENT ILLNESS:  Elizabeth Bailey  is a 75 y.o. female with a known history of COPD, diverticulosis, hypertension and hyperlipidemia. The patient was admitted for abdominal pain for 3 weeks and was diagnosed with possible perforated peptic ulcer due to abdominal CAT scan findings. 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 Now she is NOTHING by mouth with NGT suction. She complains of abdominal pain 5 out of 5, but no nausea, vomiting or diarrhea. She has poor oral intake and lost weight recently. Dr. Burt Knack request medical consult due to hypokalemia, hyponatremia and COPD.  PAST MEDICAL HISTORY:   Past Medical History:  Diagnosis Date  . Arthritis   . Asthma   . Chronic bronchitis ([place])   . Collagen vascular disease ([place])   . Depression   . Diverticulosis   . GERD (gastroesophageal reflux disease)   . High cholesterol   . Hypertension   . Seasonal allergies     PAST SURGICAL HISTOIRY:   Past Surgical History:  Procedure Laterality Date  . ABDOMINAL HYSTERECTOMY    . BREAST BIOPSY  1970's  . COLONOSCOPY  2005  . DG  BONE DENSITY ([place] HX)  2010  . DIAGNOSTIC MAMMOGRAM  2015  . GALLBLADDER SURGERY    . pap smear  2014    SOCIAL HISTORY:   Social History  Substance Use Topics  . Smoking status: Former Smoker    Packs/day: 0.50    Years: 10.00    Types: Cigarettes  . Smokeless tobacco: Never Used     Comment: quit 1988  . Alcohol use 0.0 oz/week     Comment: wine twice a month     FAMILY HISTORY:   Family History  Problem Relation Age of Onset  . Pneumonia Mother   . Depression Mother   . Depression  Sister   . Breast cancer Sister 42  . Diabetes Sister   . Stroke Brother   . Heart disease Brother     DRUG ALLERGIES:   Allergies  Allergen Reactions  . Cyclobenzaprine     Other reaction(s): Other (See Comments) Frequent urination  . Flucytosine Other (See Comments)    Sore mouth  . Fluticasone-Salmeterol     Other reaction(s): Other (See Comments) Sore mouth  . Naproxen Nausea Only    GI upset  . Other Other (See Comments)    Other reaction(s): Other (See Comments) Frequent urination Other reaction(s): Other (See Comments) Frequent urination Frequent urination    REVIEW OF SYSTEMS:  CONSTITUTIONAL: No fever, has poor oral intake and generalized weakness.  EYES: No blurred or double vision.  EARS, NOSE, AND THROAT: No tinnitus or ear pain.  RESPIRATORY: No cough, shortness of breath, wheezing or hemoptysis.  CARDIOVASCULAR: No chest pain, orthopnea, edema.  GASTROINTESTINAL: No nausea, vomiting, diarrhea but has abdominal pain.  GENITOURINARY: No dysuria, hematuria.  ENDOCRINE: No polyuria, nocturia,  HEMATOLOGY: No anemia, easy bruising or bleeding SKIN: No rash or lesion. MUSCULOSKELETAL: No joint pain or arthritis.   NEUROLOGIC: No tingling, numbness, weakness.  PSYCHIATRY: No anxiety or depression.   MEDICATIONS AT HOME:  Prior to Admission medications   Medication Sig Start Date End Date Taking? Authorizing Provider  Adalimumab (HUMIRA PEN) 40 MG/0.8ML PNKT Inject into the skin. 12/06/14  Yes Historical Provider, MD  amLODipine (NORVASC) 5 MG tablet TAKE 1 TABLET BY MOUTH ONCE DAILY. 11/29/15  Yes Arlis Porta., MD  budesonide-formoterol [facility]) 160-4.5 MCG/ACT inhaler Inhale 1 puff into the lungs 2 (two) times daily. 12/05/15  Yes Arlis Porta., MD  calcium-vitamin D (CALCIUM 500/D) 500-200 MG-UNIT tablet Take 1 tablet by mouth daily.   Yes Historical Provider, MD  escitalopram (LEXAPRO) 20 MG tablet Take 1 tablet daily 09/10/15  Yes Arlis Porta., MD  fluticasone [facility]) 50 MCG/ACT nasal spray Place 2 sprays into the nose daily as needed.   Yes Historical Provider, MD  folic acid (FOLVITE) 1 MG tablet Take 1 tablet by mouth daily.   Yes Historical Provider, MD  HYDROcodone-acetaminophen (NORCO/VICODIN) 5-325 MG tablet Take 1 tablet by mouth daily as needed. 11/28/15  Yes Historical Provider, MD  losartan-hydrochlorothiazide (HYZAAR) 100-25 MG tablet TAKE 1 TABLET BY MOUTH ONCE DAILY. 11/29/15  Yes Arlis Porta., MD  Melatonin 10 MG TABS Take 2 tablets by mouth at bedtime. Reported on 06/03/2015   Yes Historical Provider, MD  montelukast (SINGULAIR) 10 MG tablet TAKE 1 TABLET BY MOUTH ONCE DAILY. 11/29/15  Yes Arlis Porta., MD  Multiple Vitamin (MULTI-VITAMINS) TABS Take 1 tablet by mouth daily.   Yes Historical Provider, MD  Naproxen Sod-Diphenhydramine (ALEVE PM) 220-25 MG TABS Take by mouth.   Yes Historical Provider, MD  Omega 3 1000 MG CAPS Take 1,000 mg by mouth. omega-3 fatty acids-vitamin E (FISH OIL) 1,000 mg   Yes Historical Provider, MD  simvastatin (ZOCOR) 20 MG tablet Take 1 tablet every other night. Patient taking differently: Take 20 mg by mouth every other day. Take 1 tablet every night. 01/09/15  Yes Arlis Porta., MD      VITAL SIGNS:  Blood pressure (!) 158/77, pulse 62, temperature 97.7 F (36.5 C), temperature source Oral, resp. rate (!) 22, height 5\' 2"  (1.575 m), weight 146 lb (66.2 kg), SpO2 98 %.  PHYSICAL EXAMINATION:  GENERAL:  75 y.o.-year-old patient lying in the bed with no acute distress. On NGT suction. EYES: Pupils equal, round, reactive to light and accommodation. No scleral icterus. Extraocular muscles intact.  HEENT: Head atraumatic, normocephalic. Oropharynx and nasopharynx clear.  NECK:  Supple, no jugular venous distention. No thyroid enlargement, no tenderness.  LUNGS: Normal breath sounds bilaterally, no wheezing, rales,rhonchi or crepitation. No use of accessory  muscles of respiration.  CARDIOVASCULAR: S1, S2 normal. No murmurs, rubs, or gallops.  ABDOMEN: Soft, has tenderness, nondistended. No bowel sounds present. EXTREMITIES: No pedal edema, cyanosis, or clubbing.  NEUROLOGIC: Cranial nerves II through XII are intact. Muscle strength 4/5 in all extremities. Sensation intact. Gait not checked.  PSYCHIATRIC: The patient is alert and oriented x 3.  SKIN: No obvious rash, lesion, or ulcer.  LABORATORY PANEL:   CBC  Recent Labs Lab 01/24/16 1128  WBC 10.7  HGB 12.2  HCT 35.0  PLT 484*   ------------------------------------------------------------------------------------------------------------------  Chemistries   Recent Labs Lab 01/24/16 1128  NA 126*  K 2.8*  CL 83*  CO2 28  GLUCOSE 111*  BUN 13  CREATININE 1.29*  CALCIUM 9.3  AST 25  ALT 12*  ALKPHOS 62  BILITOT <0.1*   ------------------------------------------------------------------------------------------------------------------  Cardiac Enzymes  Recent Labs Lab 01/24/16 1109  TROPONINI <0.03   ------------------------------------------------------------------------------------------------------------------  RADIOLOGY:  Dg Chest 2 View  Result Date: 01/24/2016 CLINICAL DATA:  Chest pain EXAM: CHEST  2 VIEW COMPARISON:  August 09, 2012 FINDINGS: There is no edema or consolidation. The heart size and pulmonary vascularity are normal. No adenopathy. There is mild aortic atherosclerosis. There is degenerative change in the thoracic spine. IMPRESSION: Aortic atherosclerosis.  No edema or consolidation. Electronically Signed   By: Lowella Grip III M.D.   On: 01/24/2016 11:32   Ct Angio Chest Pe W And/or Wo Contrast  Result Date: 01/24/2016 CLINICAL DATA:  Right-sided chest pain EXAM: CT ANGIOGRAPHY CHEST WITH CONTRAST TECHNIQUE: Multidetector CT imaging of the chest was performed using the standard protocol during bolus administration of intravenous contrast.  Multiplanar CT image reconstructions and MIPs were obtained to evaluate the vascular anatomy. CONTRAST:  100 mL Isovue 370. COMPARISON:  04/22/2011. FINDINGS: Cardiovascular: Aortic calcifications are noted without aneurysmal dilatation or dissection. Mild coronary calcifications are seen. Pulmonary artery demonstrates a normal branching pattern without intraluminal filling defect to suggest pulmonary embolism. Mediastinum/Nodes: The thoracic inlet is within normal limits. No hilar or mediastinal adenopathy is identified. Lungs/Pleura: The lungs are well aerated bilaterally. Scattered small parenchymal nodules are noted particularly in the right lower lobe the largest of which measures 9 mm on image number 70 of series 6. When compared with the prior exam there relatively stable and likely benign in etiology. Upper Abdomen: There is thickening at the level of the gastroesophageal junction which may be related to reflux. In the duodenum there is a an outpouching of contrast material and air identified with significant inflammatory changes likely representing a contained perforated ulcer this is incompletely evaluated on this exam. Musculoskeletal: Degenerative changes of the thoracic spine are noted. No acute bony abnormality is seen. Review of the MIP images confirms the above findings. IMPRESSION: No evidence of pulmonary emboli. Stable parenchymal nodules in the right lower lobe. Changes suggestive of a perforated duodenal ulcer. Electronically Signed   By: Inez Catalina M.D.   On: 01/24/2016 14:23   Ct Abdomen Pelvis W Contrast  Result Date: 01/24/2016 CLINICAL DATA:  Right side rib cage pain, nausea and vomiting EXAM: CT ABDOMEN AND PELVIS WITH CONTRAST TECHNIQUE: Multidetector CT imaging of the abdomen and pelvis was performed using the standard protocol following bolus administration of intravenous contrast. CONTRAST:  100 cc Isovue COMPARISON:  CT chest same day FINDINGS: Lower chest: No acute findings in  lung bases Hepatobiliary: Enhanced liver is normal. The patient is status post cholecystectomy. CBD measures 7 mm in diameter. Pancreas: Enhanced pancreas is normal. Spleen: Enhanced spleen is normal. Adrenals/Urinary Tract: No adrenal gland mass. Enhanced kidneys are symmetrical in size. No hydronephrosis or hydroureter. The urinary bladder is unremarkable. Delayed renal images shows bilateral renal symmetrical excretion. Stomach/Bowel: There is thickening of proximal duodenal wall. On axial image 24 there is extraluminal contrast collection adjacent/ medial to duodenum and lateral to pancreatic head. On delayed images this collection measures 2.2 cm in diameter. There is increased small amount of extraluminal air on delay images. Findings are consistent with duodenal perforation probable perforated duodenal ulcer. Further correlation with endoscopy is recommended. Sigmoid colon diverticula are noted. There is some thickening of sigmoid colon wall probable due to chronic diverticulosis. No definite evidence of acute diverticulitis. Vascular/Lymphatic: Atherosclerotic calcifications of abdominal aorta and iliac arteries are noted. Reproductive: The patient is status post hysterectomy. Other: No pelvic ascites. No pericecal inflammation. Normal appendix is noted in axial image 47.  Musculoskeletal: No destructive bony lesions are noted. Sagittal images of the spine shows degenerative changes thoracolumbar spine. IMPRESSION: 1. There is extraluminal contrast material just medial to duodenum. There is small amount of extraluminal air medial to duodenum. Findings highly suspicious for perforated duodenal ulcer. Clinical correlation is necessary. Correlation with endoscopy and/or surgical consult is recommended. 2. Status postcholecystectomy. 3. No hydronephrosis or hydroureter. 4. Status post hysterectomy. 5. Degenerative changes thoracic spine. 6. Sigmoid diverticulosis without definite evidence of acute diverticulitis.  These results were called by telephone at the time of interpretation on 01/24/2016 at 2:21 pm to Dr. Conni Slipper , who verbally acknowledged these results. Electronically Signed   By: Lahoma Crocker M.D.   On: 01/24/2016 14:22    EKG:   Orders placed or performed during the hospital encounter of 01/24/16  . EKG 12-Lead  . EKG 12-Lead  . ED EKG within 10 minutes  . ED EKG within 10 minutes    IMPRESSION AND PLAN:   Pancreatic head mass, status post surgery. Pain control, follow-up Dr. Burt Knack and the biopsy report.  Hyponatremia. Start D5 normal saline IV, follow-up BMP.  Hypokalemia. Give IV potassium and follow-up BMP.  Acute renal failure due to dehydration. Continue IV fluid support and follow-up BMP.  COPD. Stable DuoNeb when necessary.  All the records are reviewed and case discussed with Consulting provider. Management plans discussed with the patient, family and they are in agreement.  CODE STATUS: Full code  TOTAL TIME TAKING CARE OF THIS PATIENT: 53 minutes.    Demetrios Loll M.D on 01/24/2016 at 9:16 PM  Between 7am to 6pm - Pager - [phone]  After 6pm go to [email or web address] - password [email or web address] [facility]  [facility]  Office  [phone]  CC: Primary care Physician: Dicky Doe, MD   Note: This dictation was prepared with Dragon dictation along with smaller phrase technology. Any transcriptional errors that result from this process are unintentional.

## 2016-01-24 NOTE — ED Notes (Signed)
Report given to OR - pt transferred per Dr Burt Knack without 2nd line and Ancef not given yet - OR is aware - also consent has not been signed and the OR stated they would obtain

## 2016-01-24 NOTE — ED Notes (Signed)
CT notified pt was finished drinking contrast

## 2016-01-24 NOTE — ED Notes (Signed)
Pt c/o of 5/10 pain under right breast radiating into back - VO Dr Cinda Quest for Morphine 4mg 

## 2016-01-24 NOTE — Anesthesia Postprocedure Evaluation (Signed)
Anesthesia Post Note  Patient: Elizabeth Bailey  Procedure(s) Performed: Procedure(s) (LRB): REPAIR OF PERFORATED ULCER (N/A)  Patient location during evaluation: PACU Anesthesia Type: General Level of consciousness: awake and alert Pain management: pain level controlled Vital Signs Assessment: post-procedure vital signs reviewed and stable Respiratory status: spontaneous breathing, nonlabored ventilation, respiratory function stable and patient connected to nasal cannula oxygen Cardiovascular status: blood pressure returned to baseline and stable Postop Assessment: no signs of nausea or vomiting Anesthetic complications: no    Last Vitals:  Vitals:   01/24/16 1813 01/24/16 1843  BP: (!) 153/67 (!) 158/77  Pulse: 64 62  Resp: 20 (!) 22  Temp: 36.6 C 36.5 C    Last Pain:  Vitals:   01/24/16 1918  TempSrc:   PainSc: [place]

## 2016-01-24 NOTE — Op Note (Signed)
Pre-operative Diagnosis: Perforated duodenal ulcer  Post-operative Diagnosis: Pancreatic head mass  Surgeon: Phoebe Perch   Assistants: Surgical tech  Anesthesia: Gen. with endotracheal tube  Procedure Details  The patient was seen again in the Holding Room. The benefits, complications, treatment options, and expected outcomes were discussed with the patient.  A CT scan had suggested a cause of her pain being a possible duodenal ulcer with perforation extravasation. The risks of bleeding, infection, recurrence of symptoms, failure to resolve symptoms,  bowel injury, any of which could require further surgery were reviewed with the patient.   The patient was taken to Operating Room, identified as Elizabeth Bailey and the procedure verified.  A Time Out was held and the above information confirmed.  Prior to the induction of general anesthesia, antibiotic prophylaxis was administered. VTE prophylaxis was in place. General endotracheal anesthesia was then administered and tolerated well. After the induction, the abdomen was prepped with Chloraprep and draped in the sterile fashion. The patient was positioned in the supine position.  Findings: Pancreatic head mass not involving the duodenum but posterior to it possibly arising either from the common bile duct or the pancreatic head. The duodenum and stomach were not inflamed thickened nor actively involved in this inflammatory or neoplastic process. Core biopsies were obtained.  Description of procedure patient was discharged to general anesthesia prepped draped sterile fashion a midline incision was utilized to open and explore the abdominal cavity. A mass was palpable in the area of the pylorus or distal to the pylorus and further elevation of abdominal organs allowed for realization that this was in the head of the pancreas.  A generous Kocher maneuver was performed allowing for palpation of the pancreatic head mass. Additionally the lesser  sac was opened through the gastrocolic ligament as well as the hepatoduodenal ligament avoiding the common duct area. With this full mobilization and inspection of the area there failed to identify any sort of perforation. There is no edema in the area no free fluid no free air and the pylorus stomach first and second portions and third portion of the duodenum failed to identify any edema or inflammatory process.  There was no obvious sign of a perforated ulcer. With this finding and in the believe that this was likely to be a pancreatic head mass either pancreas in origin or cholangiocarcinoma biopsies were performed by utilizing a 14-gauge core biopsy passed multiple times through the pancreas near the edge of the duodenum to obtain good sample. The samples placed on Telfa and sent off for examination in formalin. This area was inspected multiple times and there was no sign of bleeding at the termination of the procedure.  Into the lesser sac was placed a 7 mm flat JP drain and brought out through a separate incision and tied in with 3-0 nylon. During that hemostasis was adequate and the sponge lap needle count was correct the wound was closed with running #1 PDS. Marcaine was infiltrated into the skin and subcutaneous tissues tissues and then skin staples were placed as was a sterile dressing.  Patient thought of this procedure well there were no complications she was taken the recovery room in stable condition to be admitted for continued care sponge lap needle count was correct this may blood loss 25 cc.  Findings were discussed with the patient's son immediately postoperatively.  Estimated Blood Loss: 25cc         Drains: 7 mm JP drain in the lesser sac  Specimens: Core biopsies of the pancreatic head mass          Complications: None                  Condition: Stable   Esma Kilts E. Burt Knack, MD, FACS

## 2016-01-24 NOTE — ED Provider Notes (Signed)
[facility] Emergency Department Provider Note   ____________________________________________   First MD Initiated Contact with Patient 01/24/16 1112     (approximate)  I have reviewed the triage vital signs and the nursing notes.   HISTORY  Chief Complaint Chest Pain    HPI Elizabeth Bailey is a 75 y.o. female who reports she had an episode of nausea and vomiting about a month ago with some loose stools this has gone away but the pain she had in her epigastric area and radiating around to the back has remained present. Pain is sharp and seems to be in associated with a decreasing energy and increasing sleepiness. She's been taking Vicodin for the pain. Now complains of constipation. She's having a breast biopsy as noted in the nurse's notes on Tuesday for an abnormality seen on the mammogram. Pain is worse with movement, it is not affected by food she is not short of breath and does not have a cough. She is short of breath but that seems to be stable with her COPD. Patient's son reports that she has been sleeping a lot and she has told him that she is having pain with deep breathing. Nursing reports O2 sats on room air 88%   Past Medical History:  Diagnosis Date  . Arthritis   . Asthma   . Chronic bronchitis ([place])   . Collagen vascular disease ([place])   . Depression   . Diverticulosis   . GERD (gastroesophageal reflux disease)   . High cholesterol   . Hypertension   . Seasonal allergies     Patient Active Problem List   Diagnosis Date Noted  . Nodule of finger 03/28/2015  . Rheumatoid arthritis ([place]) 01/09/2015  . Insomnia 01/09/2015  . Allergic state 11/28/2014  . Airway hyperreactivity 11/28/2014  . CAFL (chronic airflow limitation) ([place]) 11/28/2014  . BP (high blood pressure) 11/28/2014  . Apnea, sleep 11/28/2014  . Depression 11/28/2014  . Carotid artery stenosis 11/28/2014  . H/O carotid endarterectomy 11/28/2014  . Seasonal allergies  11/28/2014  . Hyperlipidemia 11/28/2014  . Need for influenza vaccination 11/28/2014  . Encounter to establish care 11/28/2014  . Abnormal finding on liver function 11/29/2013  . Arthritis 11/29/2013  . Iritis 11/29/2013  . Neuritis or radiculitis due to rupture of lumbar intervertebral disc 08/22/2013  . Herpes zona 08/08/2013  . Cervical radiculitis 08/07/2013  . DDD (degenerative disc disease), cervical 08/07/2013    Past Surgical History:  Procedure Laterality Date  . ABDOMINAL HYSTERECTOMY    . BREAST BIOPSY  1970's  . COLONOSCOPY  2005  . DG  BONE DENSITY ([place] HX)  2010  . DIAGNOSTIC MAMMOGRAM  2015  . GALLBLADDER SURGERY    . pap smear  2014    Prior to Admission medications   Medication Sig Start Date End Date Taking? Authorizing Provider  Adalimumab (HUMIRA PEN) 40 MG/0.8ML PNKT Inject into the skin. 12/06/14  Yes Historical Provider, MD  amLODipine (NORVASC) 5 MG tablet TAKE 1 TABLET BY MOUTH ONCE DAILY. 11/29/15  Yes Arlis Porta., MD  budesonide-formoterol [facility]) 160-4.5 MCG/ACT inhaler Inhale 1 puff into the lungs 2 (two) times daily. 12/05/15  Yes Arlis Porta., MD  calcium-vitamin D (CALCIUM 500/D) 500-200 MG-UNIT tablet Take 1 tablet by mouth daily.   Yes Historical Provider, MD  escitalopram (LEXAPRO) 20 MG tablet Take 1 tablet daily 09/10/15  Yes Arlis Porta., MD  fluticasone [facility]) 50 MCG/ACT nasal spray Place 2 sprays  into the nose daily as needed.   Yes Historical Provider, MD  folic acid (FOLVITE) 1 MG tablet Take 1 tablet by mouth daily.   Yes Historical Provider, MD  HYDROcodone-acetaminophen (NORCO/VICODIN) 5-325 MG tablet Take 1 tablet by mouth daily as needed. 11/28/15  Yes Historical Provider, MD  losartan-hydrochlorothiazide (HYZAAR) 100-25 MG tablet TAKE 1 TABLET BY MOUTH ONCE DAILY. 11/29/15  Yes Arlis Porta., MD  Melatonin 10 MG TABS Take 2 tablets by mouth at bedtime. Reported on 06/03/2015   Yes Historical Provider, MD    montelukast (SINGULAIR) 10 MG tablet TAKE 1 TABLET BY MOUTH ONCE DAILY. 11/29/15  Yes Arlis Porta., MD  Multiple Vitamin (MULTI-VITAMINS) TABS Take 1 tablet by mouth daily.   Yes Historical Provider, MD  Naproxen Sod-Diphenhydramine (ALEVE PM) 220-25 MG TABS Take by mouth.   Yes Historical Provider, MD  Omega 3 1000 MG CAPS Take 1,000 mg by mouth. omega-3 fatty acids-vitamin E (FISH OIL) 1,000 mg   Yes Historical Provider, MD  simvastatin (ZOCOR) 20 MG tablet Take 1 tablet every other night. Patient taking differently: Take 20 mg by mouth every other day. Take 1 tablet every night. 01/09/15  Yes Arlis Porta., MD    Allergies Cyclobenzaprine; Flucytosine; Fluticasone-salmeterol; Naproxen; and Other  Family History  Problem Relation Age of Onset  . Pneumonia Mother   . Depression Mother   . Depression Sister   . Breast cancer Sister 42  . Stroke Brother   . Heart disease Brother     Social History Social History  Substance Use Topics  . Smoking status: Former Smoker    Packs/day: 0.50    Years: 10.00    Types: Cigarettes  . Smokeless tobacco: Never Used     Comment: quit 1988  . Alcohol use 0.0 oz/week     Comment: wine twice a month     Review of Systems Constitutional: No fever/chills Eyes: No visual changes. ENT: No sore throat. Cardiovascular: Denies chest pain. Respiratory: sTable shortness of breath. Gastrointestinal:  abdominal pain.  No nausea, no vomiting.  No diarrhea.  No constipation. Genitourinary: Negative for dysuria. Musculoskeletal: Negative for back pain. Skin: Negative for rash. Neurological: Negative for headaches, focal weakness or numbness.  10-point ROS otherwise negative.  ____________________________________________   PHYSICAL EXAM:  VITAL SIGNS: ED Triage Vitals  Enc Vitals Group     BP 01/24/16 1108 (!) 101/56     Pulse Rate 01/24/16 1106 76     Resp 01/24/16 1106 20     Temp 01/24/16 1106 97.5 F (36.4 C)     Temp  Source 01/24/16 1106 Oral     SpO2 01/24/16 1106 100 %     Weight 01/24/16 1106 146 lb (66.2 kg)     Height 01/24/16 1106 5\' 2"  (1.575 m)     Head Circumference --      Peak Flow --      Pain Score 01/24/16 1106 4     Pain Loc --      Pain Edu? --      Excl. in [place]? --    Constitutional: Alert and oriented. Well appearing and in no acute distress. Eyes: Conjunctivae are normal. PERRL. EOMI. Head: Atraumatic. Nose: No congestion/rhinnorhea. Mouth/Throat: Mucous membranes are moist.   Neck: No stridor.   Cardiovascular: Normal rate, regular rhythm. Grossly normal heart sounds.  Good peripheral circulation. Respiratory: Normal respiratory effort.  No retractions. Lungs CTAB. Gastrointestinal: Soft and nontender. No distention. No abdominal  bruits. No CVA tenderness. Musculoskeletal: No lower extremity tenderness nor edema.  No joint effusions. Neurologic:  Normal speech and language. No gross focal neurologic deficits are appreciated.  Skin:  Skin is warm, dry and intact. No rash noted. Psychiatric: Mood and affect are normal. Speech and behavior are normal.  ____________________________________________   LABS (all labs ordered are listed, but only abnormal results are displayed)  Labs Reviewed  COMPREHENSIVE METABOLIC PANEL - Abnormal; Notable for the following:       Result Value   Sodium 126 (*)    Potassium 2.8 (*)    Chloride 83 (*)    Glucose, Bld 111 (*)    Creatinine, Ser 1.29 (*)    ALT 12 (*)    Total Bilirubin <0.1 (*)    GFR calc non Af Amer 40 (*)    GFR calc Af Amer 46 (*)    All other components within normal limits  CBC WITH DIFFERENTIAL/PLATELET - Abnormal; Notable for the following:    RBC 3.75 (*)    Platelets 484 (*)    Neutro Abs 7.9 (*)    All other components within normal limits  URINALYSIS COMPLETEWITH MICROSCOPIC ([facility] ONLY) - Abnormal; Notable for the following:    Color, Urine YELLOW (*)    APPearance HAZY (*)    Leukocytes, UA 1+ (*)     Squamous Epithelial / LPF 6-30 (*)    All other components within normal limits  TROPONIN I  LIPASE, BLOOD   ____________________________________________  EKG  EKG read and interpreted by me shows normal sinus rhythm rate of 74 normal axis essentially normal EKG ____________________________________________  RADIOLOGY  Study Result   CLINICAL DATA:  Chest pain  EXAM: CHEST  2 VIEW  COMPARISON:  August 09, 2012  FINDINGS: There is no edema or consolidation. The heart size and pulmonary vascularity are normal. No adenopathy. There is mild aortic atherosclerosis. There is degenerative change in the thoracic spine.  IMPRESSION: Aortic atherosclerosis.  No edema or consolidation.   Electronically Signed   By: Lowella Grip III M.D.   On: 01/24/2016 11:32    ____________________________________________   PROCEDURES  Procedure(s) performed:   Procedures  Critical Care performed:   ____________________________________________   INITIAL IMPRESSION / ASSESSMENT AND PLAN / ED COURSE  Pertinent labs & imaging results that were available during my care of the patient were reviewed by me and considered in my medical decision making (see chart for details).    Clinical Course    Potassium is low patient is going to the OR. I will give her some saline with 20 of K and an additional run of 10 of potassium IV.  ____________________________________________   FINAL CLINICAL IMPRESSION(S) / ED DIAGNOSES  Final diagnoses:  Perforated duodenal ulcer ([place])      NEW MEDICATIONS STARTED DURING THIS VISIT:  New Prescriptions   No medications on file     Note:  This document was prepared using Dragon voice recognition software and may include unintentional dictation errors.    Nena Polio, MD 01/24/16 1455

## 2016-01-24 NOTE — Anesthesia Preprocedure Evaluation (Addendum)
Anesthesia Evaluation  Patient identified by MRN, date of birth, ID band Patient awake    Reviewed: Allergy & Precautions, NPO status , Patient's Chart, lab work & pertinent test results, reviewed documented beta blocker date and time   Airway Mallampati: II  TM Distance: >3 FB     Dental  (+) Chipped   Pulmonary asthma , sleep apnea , COPD, former smoker,           Cardiovascular hypertension, Pt. on medications + Peripheral Vascular Disease       Neuro/Psych PSYCHIATRIC DISORDERS Depression  Neuromuscular disease    GI/Hepatic PUD, GERD  Controlled,  Endo/Other    Renal/GU      Musculoskeletal  (+) Arthritis , Rheumatoid disorders,    Abdominal   Peds  Hematology   Anesthesia Other Findings EKG ok. CXR ok.  Reproductive/Obstetrics                            Anesthesia Physical Anesthesia Plan  ASA: III  Anesthesia Plan: General   Post-op Pain Management:    Induction: Intravenous and Rapid sequence  Airway Management Planned: Oral ETT  Additional Equipment:   Intra-op Plan:   Post-operative Plan:   Informed Consent: I have reviewed the patients History and Physical, chart, labs and discussed the procedure including the risks, benefits and alternatives for the proposed anesthesia with the patient or authorized representative who has indicated his/her understanding and acceptance.     Plan Discussed with: CRNA  Anesthesia Plan Comments:         Anesthesia Quick Evaluation

## 2016-01-24 NOTE — ED Notes (Signed)
Pt reports that she has been having sharp pain/nausea/vomiting for the last 2 weeks - the pain is in the right rib cage radiating into the back - son states she has had decreased energy since September and has been unable to ambulate and "get around" without taking pain medication (Vicodin) - she is having a breast biopsy Tuesday for abnormality noted on mammogram

## 2016-01-24 NOTE — ED Notes (Signed)
Pt c/o nausea from drinking contrast - Dr Cinda Quest notified and order received for Zofran

## 2016-01-24 NOTE — Progress Notes (Addendum)
MEDICATION RELATED CONSULT NOTE - INITIAL   Pharmacy Consult for Electrolytes Indication: Mag, Ca  Allergies  Allergen Reactions  . Cyclobenzaprine     Other reaction(s): Other (See Comments) Frequent urination  . Flucytosine Other (See Comments)    Sore mouth  . Fluticasone-Salmeterol     Other reaction(s): Other (See Comments) Sore mouth  . Naproxen Nausea Only    GI upset  . Other Other (See Comments)    Other reaction(s): Other (See Comments) Frequent urination Other reaction(s): Other (See Comments) Frequent urination Frequent urination    Patient Measurements: Height: 5\' 2"  (157.5 cm) Weight: 146 lb (66.2 kg) IBW/kg (Calculated) : 50.1 Adjusted Body Weight:   Vital Signs: Temp: 97.7 F (36.5 C) (11/24 1843) Temp Source: Oral (11/24 1843) BP: 158/77 (11/24 1843) Pulse Rate: 62 (11/24 1843) Intake/Output from previous day: No intake/output data recorded. Intake/Output from this shift: No intake/output data recorded.  Labs:  Recent Labs  01/24/16 1128 01/24/16 2101  WBC 10.7  --   HGB 12.2  --   HCT 35.0  --   PLT 484*  --   CREATININE 1.29* 1.05*  MG  --  1.5*  ALBUMIN 3.5  --   PROT 7.9  --   AST 25  --   ALT 12*  --   ALKPHOS 62  --   BILITOT <0.1*  --    Estimated Creatinine Clearance: 41.9 mL/min (by C-G formula based on SCr of 1.05 mg/dL (H)).   Microbiology: No results found for this or any previous visit (from the past 720 hour(s)).  Medical History: Past Medical History:  Diagnosis Date  . Arthritis   . Asthma   . Chronic bronchitis ([place])   . Collagen vascular disease ([place])   . Depression   . Diverticulosis   . GERD (gastroesophageal reflux disease)   . High cholesterol   . Hypertension   . Seasonal allergies     Medications:  Scheduled:  . amLODipine  5 mg Oral Daily  . calcium gluconate  1 g Intravenous Once  .  ceFAZolin (ANCEF) IV  1 g Intravenous Q6H  . fluticasone  2 spray Each Nare Daily  . heparin subcutaneous   5,000 Units Subcutaneous Q8H  . heparin  5,000 Units Subcutaneous Q8H  . losartan  100 mg Oral Daily   And  . hydrochlorothiazide  25 mg Oral Daily  . magnesium sulfate 1 - 4 g bolus IVPB  2 g Intravenous Once  . mometasone-formoterol  2 puff Inhalation BID  . montelukast  10 mg Oral Daily  . pantoprazole (PROTONIX) IV  40 mg Intravenous Q12H    Assessment: 11/24:  Mg = 1.5,  Ca = 8.3, Alb =3.5, Corrected Ca = 8.7   Goal of Therapy:  Normalization of electrolytes  Plan:  Magnesium sulfate 2 gm IV X 1 ordered for 11/24 @ 21:45.  Calcium Gluconate 1 gm IV X 1 ordered for 11/24 @ 22:00.  11/25 0612 K 4, Mg 2.1, Ca 8.4, Phos 2.1. Will give additional calcium gluconate 1 gm IV x 1 and sodium phosphate 10 mmol IV x 1, recheck electrolytes with AM labs. -NAC  Will recheck electrolytes on 11/24 with AM labs.   Robbins,Jason D 01/24/2016,9:51 PM

## 2016-01-24 NOTE — Transfer of Care (Signed)
Immediate Anesthesia Transfer of Care Note  Patient: Elizabeth Bailey  Procedure(s) Performed: Procedure(s): REPAIR OF PERFORATED ULCER (N/A)  Patient Location: PACU  Anesthesia Type:General  Level of Consciousness: sedated  Airway & Oxygen Therapy: Patient Spontanous Breathing and Patient connected to face mask oxygen  Post-op Assessment: Report given to RN and Post -op Vital signs reviewed and stable  Post vital signs: Reviewed and stable  Last Vitals:  Vitals:   01/24/16 1530 01/24/16 1701  BP: (!) 147/65   Pulse:    Resp:    Temp:  (P) 36.4 C    Last Pain:  Vitals:   01/24/16 1430  TempSrc:   PainSc: 6          Complications: No apparent anesthesia complications

## 2016-01-24 NOTE — Anesthesia Procedure Notes (Signed)
Procedure Name: Intubation Date/Time: 01/24/2016 3:54 PM Performed by: Johnna Acosta Pre-anesthesia Checklist: Patient identified, Emergency Drugs available, Suction available, Patient being monitored and Timeout performed Patient Re-evaluated:Patient Re-evaluated prior to inductionOxygen Delivery Method: Circle system utilized Preoxygenation: Pre-oxygenation with 100% oxygen Intubation Type: IV induction, Cricoid Pressure applied and Rapid sequence Laryngoscope Size: Miller and 2 Grade View: Grade I Tube type: Oral Tube size: 7.0 mm Number of attempts: 1 Airway Equipment and Method: Stylet Placement Confirmation: ETT inserted through vocal cords under direct vision,  positive ETCO2 and breath sounds checked- equal and bilateral Secured at: 21 cm Tube secured with: Tape Dental Injury: Teeth and Oropharynx as per pre-operative assessment

## 2016-01-24 NOTE — ED Notes (Signed)
Patient transported to X-ray 

## 2016-01-24 NOTE — ED Notes (Signed)
Pt O2 sat 88% despite guidance with deep inspirations - started on 2L O2 via n/c - Dr Cinda Quest notified

## 2016-01-24 NOTE — H&P (Signed)
Elizabeth Bailey is an 75 y.o. female.    Chief Complaint: Epigastric pain  HPI: This patient with 3 weeks of abdominal pain in the epigastrium it is no worse today than it was yesterday or the day before but she came to the emergency room because her son was in town and brought her to the emergency room for this long-standing illness. She has not had any acute worsening during this 3 week time frame and cannot remember exactly when it started. She's never had an episode like this before. She's had no fevers or chills but it is hurting considerably and is hurting in her back as well. A workup in the emergency room was including a CT scan that was suggestive of a perforated duodenal ulcer with extravasation She has a scheduled core [facility] for an abnormal mammogram  Past Medical History:  Diagnosis Date  . Arthritis   . Asthma   . Chronic bronchitis (HCC)   . Collagen vascular disease (HCC)   . Depression   . Diverticulosis   . GERD (gastroesophageal reflux disease)   . High cholesterol   . Hypertension   . Seasonal allergies     Past Surgical History:  Procedure Laterality Date  . ABDOMINAL HYSTERECTOMY    . BREAST BIOPSY  1970's  . COLONOSCOPY  2005  . DG  BONE DENSITY ([facility] HX)  2010  . DIAGNOSTIC MAMMOGRAM  2015  . GALLBLADDER SURGERY    . pap smear  2014    Family History  Problem Relation Age of Onset  . Pneumonia Mother   . Depression Mother   . Depression Sister   . Breast cancer Sister 70  . Stroke Brother   . Heart disease Brother    Social History:  reports that she has quit smoking. Her smoking use included Cigarettes. She has a 5.00 pack-year smoking history. She has never used smokeless tobacco. She reports that she drinks alcohol. She reports that she does not use drugs.  Allergies:  Allergies  Allergen Reactions  . Cyclobenzaprine     Other reaction(s): Other (See Comments) Frequent urination  . Flucytosine Other (See  Comments)    Sore mouth  . Fluticasone-Salmeterol     Other reaction(s): Other (See Comments) Sore mouth  . Naproxen Nausea Only    GI upset  . Other Other (See Comments)    Other reaction(s): Other (See Comments) Frequent urination Other reaction(s): Other (See Comments) Frequent urination Frequent urination     (Not in a hospital admission)   Review of Systems  Constitutional: Positive for malaise/fatigue. Negative for chills, fever and weight loss.  HENT: Negative.   Eyes: Negative.   Respiratory: Positive for shortness of breath and wheezing. Negative for cough and hemoptysis.   Cardiovascular: Negative for chest pain, palpitations, orthopnea and leg swelling.  Gastrointestinal: Positive for abdominal pain, heartburn, nausea and vomiting. Negative for blood in stool, constipation, diarrhea and melena.  Genitourinary: Negative.   Musculoskeletal: Negative.   Skin: Negative for itching and rash.  Neurological: Positive for weakness.  Endo/Heme/Allergies: Negative.   Psychiatric/Behavioral: Negative.      Physical Exam:  BP 124/61   Pulse 76   Temp 97.5 F (36.4 C) (Oral)   Resp 19   Ht 5\' 2"  (1.575 m)   Wt 146 lb (66.2 kg)   SpO2 100%   BMI 26.70 kg/m   Physical Exam  Constitutional: She is oriented to person, place, and time. She appears distressed.  Appears quite uncomfortable preferring to sit up and she has an abnormally elevated respiratory rate  Note that a breast exam was not performed due to the patient's acute process and surgical needs at this time. She does have a core biopsy scheduled on the right breast in the near future.  HENT:  Head: Normocephalic and atraumatic.  Eyes: Pupils are equal, round, and reactive to light. Right eye exhibits no discharge. Left eye exhibits no discharge. No scleral icterus.  Neck: Normal range of motion.  Cardiovascular: Normal rate, regular rhythm and normal heart sounds.   Pulmonary/Chest: She is in respiratory  distress. She has wheezes. She has no rales. She exhibits no tenderness.  Abdominal: She exhibits no distension. There is tenderness. There is guarding. There is no rebound.  Maximal tenderness in the epigastrium and supraumbilical area with guarding but no rebound or percussion tenderness. No mass  Musculoskeletal: Normal range of motion. She exhibits edema. She exhibits no tenderness.  Lymphadenopathy:    She has no cervical adenopathy.  Neurological: She is alert and oriented to person, place, and time.  Skin: Skin is warm and dry. No rash noted. She is not diaphoretic. No erythema.  Psychiatric: Mood and affect normal.  Vitals reviewed.       Results for orders placed or performed during the hospital encounter of 01/24/16 (from the past 48 hour(s))  Troponin I     Status: None   Collection Time: 01/24/16 11:09 AM  Result Value Ref Range   Troponin I <0.03 <0.03 ng/mL  Comprehensive metabolic panel     Status: Abnormal   Collection Time: 01/24/16 11:28 AM  Result Value Ref Range   Sodium 126 (L) 135 - 145 mmol/L   Potassium 2.8 (L) 3.5 - 5.1 mmol/L   Chloride 83 (L) 101 - 111 mmol/L   CO2 28 22 - 32 mmol/L   Glucose, Bld 111 (H) 65 - 99 mg/dL   BUN 13 6 - 20 mg/dL   Creatinine, Ser 1.29 (H) 0.44 - 1.00 mg/dL   Calcium 9.3 8.9 - 10.3 mg/dL   Total Protein 7.9 6.5 - 8.1 g/dL   Albumin 3.5 3.5 - 5.0 g/dL   AST 25 15 - 41 U/L   ALT 12 (L) 14 - 54 U/L   Alkaline Phosphatase 62 38 - 126 U/L   Total Bilirubin <0.1 (L) 0.3 - 1.2 mg/dL   GFR calc non Af Amer 40 (L) >60 mL/min   GFR calc Af Amer 46 (L) >60 mL/min    Comment: (NOTE) The eGFR has been calculated using the CKD EPI equation. This calculation has not been validated in all clinical situations. eGFR's persistently <60 mL/min signify possible Chronic Kidney Disease.    Anion gap 15 5 - 15  CBC with Differential     Status: Abnormal   Collection Time: 01/24/16 11:28 AM  Result Value Ref Range   WBC 10.7 3.6 - 11.0  K/uL   RBC 3.75 (L) 3.80 - 5.20 MIL/uL   Hemoglobin 12.2 12.0 - 16.0 g/dL   HCT 35.0 35.0 - 47.0 %   MCV 93.2 80.0 - 100.0 fL   MCH 32.6 26.0 - 34.0 pg   MCHC 35.0 32.0 - 36.0 g/dL   RDW 12.3 11.5 - 14.5 %   Platelets 484 (H) 150 - 440 K/uL   Neutrophils Relative % 73 %   Neutro Abs 7.9 (H) 1.4 - 6.5 K/uL   Lymphocytes Relative 17 %   Lymphs Abs 1.8 1.0 -  3.6 K/uL   Monocytes Relative 8 %   Monocytes Absolute 0.9 0.2 - 0.9 K/uL   Eosinophils Relative 1 %   Eosinophils Absolute 0.1 0 - 0.7 K/uL   Basophils Relative 1 %   Basophils Absolute 0.1 0 - 0.1 K/uL  Lipase, blood     Status: None   Collection Time: 01/24/16 11:28 AM  Result Value Ref Range   Lipase 18 11 - 51 U/L  Urinalysis complete, with microscopic     Status: Abnormal   Collection Time: 01/24/16 11:28 AM  Result Value Ref Range   Color, Urine YELLOW (A) YELLOW   APPearance HAZY (A) CLEAR   Glucose, UA NEGATIVE NEGATIVE mg/dL   Bilirubin Urine NEGATIVE NEGATIVE   Ketones, ur NEGATIVE NEGATIVE mg/dL   Specific Gravity, Urine 1.015 1.005 - 1.030   Hgb urine dipstick NEGATIVE NEGATIVE   pH 6.0 5.0 - 8.0   Protein, ur NEGATIVE NEGATIVE mg/dL   Nitrite NEGATIVE NEGATIVE   Leukocytes, UA 1+ (A) NEGATIVE   RBC / HPF 0-5 0 - 5 RBC/hpf   WBC, UA 0-5 0 - 5 WBC/hpf   Bacteria, UA NONE SEEN NONE SEEN   Squamous Epithelial / LPF 6-30 (A) NONE SEEN   Mucous PRESENT    Dg Chest 2 View  Result Date: 01/24/2016 CLINICAL DATA:  Chest pain EXAM: CHEST  2 VIEW COMPARISON:  August 09, 2012 FINDINGS: There is no edema or consolidation. The heart size and pulmonary vascularity are normal. No adenopathy. There is mild aortic atherosclerosis. There is degenerative change in the thoracic spine. IMPRESSION: Aortic atherosclerosis.  No edema or consolidation. Electronically Signed   By: Lowella Grip III M.D.   On: 01/24/2016 11:32   Ct Angio Chest Pe W And/or Wo Contrast  Result Date: 01/24/2016 CLINICAL DATA:  Right-sided chest  pain EXAM: CT ANGIOGRAPHY CHEST WITH CONTRAST TECHNIQUE: Multidetector CT imaging of the chest was performed using the standard protocol during bolus administration of intravenous contrast. Multiplanar CT image reconstructions and MIPs were obtained to evaluate the vascular anatomy. CONTRAST:  100 mL Isovue 370. COMPARISON:  04/22/2011. FINDINGS: Cardiovascular: Aortic calcifications are noted without aneurysmal dilatation or dissection. Mild coronary calcifications are seen. Pulmonary artery demonstrates a normal branching pattern without intraluminal filling defect to suggest pulmonary embolism. Mediastinum/Nodes: The thoracic inlet is within normal limits. No hilar or mediastinal adenopathy is identified. Lungs/Pleura: The lungs are well aerated bilaterally. Scattered small parenchymal nodules are noted particularly in the right lower lobe the largest of which measures 9 mm on image number 70 of series 6. When compared with the prior exam there relatively stable and likely benign in etiology. Upper Abdomen: There is thickening at the level of the gastroesophageal junction which may be related to reflux. In the duodenum there is a an outpouching of contrast material and air identified with significant inflammatory changes likely representing a contained perforated ulcer this is incompletely evaluated on this exam. Musculoskeletal: Degenerative changes of the thoracic spine are noted. No acute bony abnormality is seen. Review of the MIP images confirms the above findings. IMPRESSION: No evidence of pulmonary emboli. Stable parenchymal nodules in the right lower lobe. Changes suggestive of a perforated duodenal ulcer. Electronically Signed   By: Inez Catalina M.D.   On: 01/24/2016 14:23   Ct Abdomen Pelvis W Contrast  Result Date: 01/24/2016 CLINICAL DATA:  Right side rib cage pain, nausea and vomiting EXAM: CT ABDOMEN AND PELVIS WITH CONTRAST TECHNIQUE: Multidetector CT imaging of the abdomen and  pelvis was  performed using the standard protocol following bolus administration of intravenous contrast. CONTRAST:  100 cc Isovue COMPARISON:  CT chest same day FINDINGS: Lower chest: No acute findings in lung bases Hepatobiliary: Enhanced liver is normal. The patient is status post cholecystectomy. CBD measures 7 mm in diameter. Pancreas: Enhanced pancreas is normal. Spleen: Enhanced spleen is normal. Adrenals/Urinary Tract: No adrenal gland mass. Enhanced kidneys are symmetrical in size. No hydronephrosis or hydroureter. The urinary bladder is unremarkable. Delayed renal images shows bilateral renal symmetrical excretion. Stomach/Bowel: There is thickening of proximal duodenal wall. On axial image 24 there is extraluminal contrast collection adjacent/ medial to duodenum and lateral to pancreatic head. On delayed images this collection measures 2.2 cm in diameter. There is increased small amount of extraluminal air on delay images. Findings are consistent with duodenal perforation probable perforated duodenal ulcer. Further correlation with endoscopy is recommended. Sigmoid colon diverticula are noted. There is some thickening of sigmoid colon wall probable due to chronic diverticulosis. No definite evidence of acute diverticulitis. Vascular/Lymphatic: Atherosclerotic calcifications of abdominal aorta and iliac arteries are noted. Reproductive: The patient is status post hysterectomy. Other: No pelvic ascites. No pericecal inflammation. Normal appendix is noted in axial image 47. Musculoskeletal: No destructive bony lesions are noted. Sagittal images of the spine shows degenerative changes thoracolumbar spine. IMPRESSION: 1. There is extraluminal contrast material just medial to duodenum. There is small amount of extraluminal air medial to duodenum. Findings highly suspicious for perforated duodenal ulcer. Clinical correlation is necessary. Correlation with endoscopy and/or surgical consult is recommended. 2. Status  postcholecystectomy. 3. No hydronephrosis or hydroureter. 4. Status post hysterectomy. 5. Degenerative changes thoracic spine. 6. Sigmoid diverticulosis without definite evidence of acute diverticulitis. These results were called by telephone at the time of interpretation on 01/24/2016 at 2:21 pm to Dr. Conni Slipper , who verbally acknowledged these results. Electronically Signed   By: Lahoma Crocker M.D.   On: 01/24/2016 14:22     Assessment/Plan  CT scan is personally reviewed from multiple aspects. There appears to be a perforation medial to the duodenum near the pancreas and a typical location the first portion the duodenum.  This is a patient with 3 weeks of abdominal pain that seems to be static and not worsening but there is clear sign of extravasation of contrast on CT scan. This likely represents perforated duodenal ulcer.  I discussed with she and her son the options of observation and IV antibiotics etc. but because of the active extravasation in the patient's relative respiratory distress and ongoing COPD I would recommend urgent exploration. Will likely require a Graham's patch to repair a perforated ulcer. The options were reviewed the risks of a negative laparotomy and the risk of bleeding infection recurrence recurrent ulcer were all reviewed with them they understood and agreed to proceed  Discussed this with Dr. Rip Harbour and he is and has been replacing her potassium and her electrolytes will be addressed. Internal medicine has been consult as well.  Florene Glen, MD, FACS

## 2016-01-25 LAB — CBC
HCT: 33.3 % — ABNORMAL LOW (ref 35.0–47.0)
HEMOGLOBIN: 11.5 g/dL — AB (ref 12.0–16.0)
MCH: 32.3 pg (ref 26.0–34.0)
MCHC: 34.5 g/dL (ref 32.0–36.0)
MCV: 93.8 fL (ref 80.0–100.0)
PLATELETS: 428 10*3/uL (ref 150–440)
RBC: 3.55 MIL/uL — ABNORMAL LOW (ref 3.80–5.20)
RDW: 12.8 % (ref 11.5–14.5)
WBC: 12.5 10*3/uL — ABNORMAL HIGH (ref 3.6–11.0)

## 2016-01-25 LAB — BASIC METABOLIC PANEL
Anion gap: 8 (ref 5–15)
BUN: 8 mg/dL (ref 6–20)
CALCIUM: 8.4 mg/dL — AB (ref 8.9–10.3)
CO2: 27 mmol/L (ref 22–32)
CREATININE: 0.95 mg/dL (ref 0.44–1.00)
Chloride: 93 mmol/L — ABNORMAL LOW (ref 101–111)
GFR calc Af Amer: >60 mL/min (ref >60)
GFR, EST NON AFRICAN AMERICAN: 58 mL/min — AB (ref >60)
GLUCOSE: 150 mg/dL — AB (ref 65–99)
Potassium: 4 mmol/L (ref 3.5–5.1)
Sodium: 128 mmol/L — ABNORMAL LOW (ref 135–145)

## 2016-01-25 LAB — MAGNESIUM: MAGNESIUM: 2.1 mg/dL (ref 1.7–2.4)

## 2016-01-25 LAB — PHOSPHORUS: Phosphorus: 2.1 mg/dL — ABNORMAL LOW (ref 2.5–4.6)

## 2016-01-25 MED ORDER — SODIUM PHOSPHATES 45 MMOLE/15ML IV SOLN
10.0000 mmol | Freq: Once | INTRAVENOUS | Status: AC
  Administered 2016-01-25: 10 mmol via INTRAVENOUS
  Filled 2016-01-25: qty 3.33

## 2016-01-25 MED ORDER — MENTHOL 3 MG MT LOZG
1.0000 | LOZENGE | OROMUCOSAL | Status: DC | PRN
  Administered 2016-01-25 – 2016-01-26 (×4): 3 mg via ORAL
  Filled 2016-01-25: qty 9

## 2016-01-25 MED ORDER — MORPHINE SULFATE (PF) 4 MG/ML IV SOLN
3.0000 mg | INTRAVENOUS | Status: DC | PRN
  Administered 2016-01-25 – 2016-01-27 (×15): 3 mg via INTRAVENOUS
  Filled 2016-01-25 (×15): qty 1

## 2016-01-25 MED ORDER — SODIUM CHLORIDE 0.9 % IV SOLN
1.0000 g | Freq: Once | INTRAVENOUS | Status: AC
  Administered 2016-01-25: 1 g via INTRAVENOUS
  Filled 2016-01-25: qty 10

## 2016-01-25 NOTE — Progress Notes (Signed)
[place] [facility] [facility] [place] NAME: Elizabeth Bailey    MR#:  [id number]  DATE OF BIRTH:  1940/04/09  SUBJECTIVE:  Has some abdominal pain from surgery no nausea or vomiting  REVIEW OF SYSTEMS:    Review of Systems  Constitutional: Negative.  Negative for chills, fever and malaise/fatigue.  HENT: Negative.  Negative for ear discharge, ear pain, hearing loss, nosebleeds and sore throat.   Eyes: Negative.  Negative for blurred vision and pain.  Respiratory: Negative.  Negative for cough, hemoptysis, shortness of breath and wheezing.   Cardiovascular: Negative.  Negative for chest pain, palpitations and leg swelling.  Gastrointestinal: Positive for abdominal pain. Negative for blood in stool, diarrhea, nausea and vomiting.  Genitourinary: Negative.  Negative for dysuria.  Musculoskeletal: Positive for back pain.  Skin: Negative.   Neurological: Negative for dizziness, tremors, speech change, focal weakness, seizures and headaches.  Endo/Heme/Allergies: Negative.  Does not bruise/bleed easily.  Psychiatric/Behavioral: Negative.  Negative for depression, hallucinations and suicidal ideas.    Tolerating Diet: Nothing by mouth      DRUG ALLERGIES:   Allergies  Allergen Reactions  . Cyclobenzaprine     Other reaction(s): Other (See Comments) Frequent urination  . Flucytosine Other (See Comments)    Sore mouth  . Fluticasone-Salmeterol     Other reaction(s): Other (See Comments) Sore mouth  . Naproxen Nausea Only    GI upset  . Other Other (See Comments)    Other reaction(s): Other (See Comments) Frequent urination Other reaction(s): Other (See Comments) Frequent urination Frequent urination    VITALS:  Blood pressure (!) 112/54, pulse 75, temperature 97.8 F (36.6 C), temperature source Oral, resp. rate 20, height 5\' 2"  (1.575 m), weight 66.2 kg (146 lb), SpO2 97 %.  PHYSICAL EXAMINATION:   Physical Exam  Constitutional: She is oriented  to person, place, and time and well-developed, well-nourished, and in no distress. No distress.  HENT:  Head: Normocephalic.  NG tube  Eyes: No scleral icterus.  Neck: Normal range of motion. Neck supple. No JVD present. No tracheal deviation present.  Cardiovascular: Normal rate.  Exam reveals no gallop and no friction rub.   Murmur heard. PVCs  Pulmonary/Chest: Effort normal and breath sounds normal. No respiratory distress. She has no wheezes. She has no rales. She exhibits no tenderness.  Abdominal: Soft. She exhibits no distension and no mass. There is no tenderness. There is no rebound and no guarding.  Honeycombed dressing in place and JP drain Mild tenderness but no rebound or guarding   Musculoskeletal: Normal range of motion. She exhibits no edema.  Neurological: She is alert and oriented to person, place, and time.  Skin: Skin is warm. No rash noted. No erythema.  Psychiatric: Affect and judgment normal.      LABORATORY PANEL:   CBC  Recent Labs Lab 01/25/16 0612  WBC 12.5*  HGB 11.5*  HCT 33.3*  PLT 428   ------------------------------------------------------------------------------------------------------------------  Chemistries   Recent Labs Lab 01/24/16 1128  01/25/16 0612  NA 126*  < > 128*  K 2.8*  < > 4.0  CL 83*  < > 93*  CO2 28  < > 27  GLUCOSE 111*  < > 150*  BUN 13  < > 8  CREATININE 1.29*  < > 0.95  CALCIUM 9.3  < > 8.4*  MG  --   < > 2.1  AST 25  --   --   ALT 12*  --   --  ALKPHOS 62  --   --   BILITOT <0.1*  --   --   < > = values in this interval not displayed. ------------------------------------------------------------------------------------------------------------------  Cardiac Enzymes  Recent Labs Lab 01/24/16 1109  TROPONINI <0.03   ------------------------------------------------------------------------------------------------------------------  RADIOLOGY:  Dg Chest 2 View  Result Date: 01/24/2016 CLINICAL  DATA:  Chest pain EXAM: CHEST  2 VIEW COMPARISON:  August 09, 2012 FINDINGS: There is no edema or consolidation. The heart size and pulmonary vascularity are normal. No adenopathy. There is mild aortic atherosclerosis. There is degenerative change in the thoracic spine. IMPRESSION: Aortic atherosclerosis.  No edema or consolidation. Electronically Signed   By: Lowella Grip III M.D.   On: 01/24/2016 11:32   Ct Angio Chest Pe W And/or Wo Contrast  Result Date: 01/24/2016 CLINICAL DATA:  Right-sided chest pain EXAM: CT ANGIOGRAPHY CHEST WITH CONTRAST TECHNIQUE: Multidetector CT imaging of the chest was performed using the standard protocol during bolus administration of intravenous contrast. Multiplanar CT image reconstructions and MIPs were obtained to evaluate the vascular anatomy. CONTRAST:  100 mL Isovue 370. COMPARISON:  04/22/2011. FINDINGS: Cardiovascular: Aortic calcifications are noted without aneurysmal dilatation or dissection. Mild coronary calcifications are seen. Pulmonary artery demonstrates a normal branching pattern without intraluminal filling defect to suggest pulmonary embolism. Mediastinum/Nodes: The thoracic inlet is within normal limits. No hilar or mediastinal adenopathy is identified. Lungs/Pleura: The lungs are well aerated bilaterally. Scattered small parenchymal nodules are noted particularly in the right lower lobe the largest of which measures 9 mm on image number 70 of series 6. When compared with the prior exam there relatively stable and likely benign in etiology. Upper Abdomen: There is thickening at the level of the gastroesophageal junction which may be related to reflux. In the duodenum there is a an outpouching of contrast material and air identified with significant inflammatory changes likely representing a contained perforated ulcer this is incompletely evaluated on this exam. Musculoskeletal: Degenerative changes of the thoracic spine are noted. No acute bony  abnormality is seen. Review of the MIP images confirms the above findings. IMPRESSION: No evidence of pulmonary emboli. Stable parenchymal nodules in the right lower lobe. Changes suggestive of a perforated duodenal ulcer. Electronically Signed   By: Inez Catalina M.D.   On: 01/24/2016 14:23   Ct Abdomen Pelvis W Contrast  Result Date: 01/24/2016 CLINICAL DATA:  Right side rib cage pain, nausea and vomiting EXAM: CT ABDOMEN AND PELVIS WITH CONTRAST TECHNIQUE: Multidetector CT imaging of the abdomen and pelvis was performed using the standard protocol following bolus administration of intravenous contrast. CONTRAST:  100 cc Isovue COMPARISON:  CT chest same day FINDINGS: Lower chest: No acute findings in lung bases Hepatobiliary: Enhanced liver is normal. The patient is status post cholecystectomy. CBD measures 7 mm in diameter. Pancreas: Enhanced pancreas is normal. Spleen: Enhanced spleen is normal. Adrenals/Urinary Tract: No adrenal gland mass. Enhanced kidneys are symmetrical in size. No hydronephrosis or hydroureter. The urinary bladder is unremarkable. Delayed renal images shows bilateral renal symmetrical excretion. Stomach/Bowel: There is thickening of proximal duodenal wall. On axial image 24 there is extraluminal contrast collection adjacent/ medial to duodenum and lateral to pancreatic head. On delayed images this collection measures 2.2 cm in diameter. There is increased small amount of extraluminal air on delay images. Findings are consistent with duodenal perforation probable perforated duodenal ulcer. Further correlation with endoscopy is recommended. Sigmoid colon diverticula are noted. There is some thickening of sigmoid colon wall probable due to chronic diverticulosis. No  definite evidence of acute diverticulitis. Vascular/Lymphatic: Atherosclerotic calcifications of abdominal aorta and iliac arteries are noted. Reproductive: The patient is status post hysterectomy. Other: No pelvic ascites. No  pericecal inflammation. Normal appendix is noted in axial image 47. Musculoskeletal: No destructive bony lesions are noted. Sagittal images of the spine shows degenerative changes thoracolumbar spine. IMPRESSION: 1. There is extraluminal contrast material just medial to duodenum. There is small amount of extraluminal air medial to duodenum. Findings highly suspicious for perforated duodenal ulcer. Clinical correlation is necessary. Correlation with endoscopy and/or surgical consult is recommended. 2. Status postcholecystectomy. 3. No hydronephrosis or hydroureter. 4. Status post hysterectomy. 5. Degenerative changes thoracic spine. 6. Sigmoid diverticulosis without definite evidence of acute diverticulitis. These results were called by telephone at the time of interpretation on 01/24/2016 at 2:21 pm to Dr. Conni Slipper , who verbally acknowledged these results. Electronically Signed   By: Lahoma Crocker M.D.   On: 01/24/2016 14:22     ASSESSMENT AND PLAN:   75 year old female postoperative day #1 exploratory laparotomy with biopsy of pancreas with hyponatremia.  1. Hypovolemic hyponatremia: Continue D5 normal saline BMP for a.m.  2. Hypokalemia: Improved with replacement  3. Acute kidney injury in the setting of hypovolemia and surgery she is improved and responded to IV fluids  4. Abdominal pain with findings on CT scan suggestive of perforation medial to the duodenum and pancreatic head mass: Management as per surgery   5. Irregular heart rhythm: Telemetry ordered  Management plans discussed with the patient and she is in agreement.  CODE STATUS: full  TOTAL TIME TAKING CARE OF THIS PATIENT: 30 minutes.   Rounded with nursing staff and discussed with patient's son  POSSIBLE D/C ??, DEPENDING ON CLINICAL CONDITION.   Shali Vesey M.D on 01/25/2016 at 10:33 AM  Between 7am to 6pm - Pager - [phone] After 6pm go to [email or web address] - password [email or web address] [facility]   Office  [phone]  CC: Primary care physician; Dicky Doe, MD  Note: This dictation was prepared with Dragon dictation along with smaller phrase technology. Any transcriptional errors that result from this process are unintentional.

## 2016-01-25 NOTE — Plan of Care (Signed)
Problem: Pain Managment: Goal: General experience of comfort will improve Outcome: Not Progressing Patient still having quite a bit of pain, even with medication, MD notified

## 2016-01-25 NOTE — Progress Notes (Signed)
Spoke to Dr. Burt Knack about patient still having quite a bit of pain even with getting pain medication about every 2 hours.  He acknowledged and gave a verbal order toincrease the dose of Morphine to 3mg  every 2 hours.

## 2016-01-25 NOTE — Progress Notes (Signed)
1 Day Post-Op  Subjective: Incisional pain today. No nausea or vomiting. Not passing much gas.  Objective: Vital signs in last 24 hours: Temp:  [97.5 F (36.4 C)-97.8 F (36.6 C)] 97.8 F (36.6 C) (11/25 0520) Pulse Rate:  [61-82] 67 (11/25 0520) Resp:  [11-22] 20 (11/25 0520) BP: (101-164)/(56-95) 141/72 (11/25 0520) SpO2:  [93 %-100 %] 100 % (11/25 0520) Weight:  [146 lb (66.2 kg)] 146 lb (66.2 kg) (11/24 1106) Last BM Date: 01/20/16  Intake/Output from previous day: 11/24 0701 - 11/25 0700 In: 1705 [I.V.:1705] Out: 1335 [Urine:1170; Emesis/NG output:100; Drains:40; Blood:25] Intake/Output this shift: No intake/output data recorded.  Physical exam:  Awake alert and oriented NG tube in place abdomen is soft distended tympanitic minimally tender no erythema or drainage from wound drain is serous only nontender calves  Lab Results: CBC   Recent Labs  01/24/16 1128 01/25/16 0612  WBC 10.7 12.5*  HGB 12.2 11.5*  HCT 35.0 33.3*  PLT 484* 428   BMET  Recent Labs  01/24/16 2101 01/25/16 0612  NA 125* 128*  K 3.5 4.0  CL 90* 93*  CO2 25 27  GLUCOSE 171* 150*  BUN 10 8  CREATININE 1.05* 0.95  CALCIUM 8.3* 8.4*   PT/INR No results for input(s): LABPROT, INR in the last 72 hours. ABG No results for input(s): PHART, HCO3 in the last 72 hours.  Invalid input(s): PCO2, PO2  Studies/Results: Dg Chest 2 View  Result Date: 01/24/2016 CLINICAL DATA:  Chest pain EXAM: CHEST  2 VIEW COMPARISON:  August 09, 2012 FINDINGS: There is no edema or consolidation. The heart size and pulmonary vascularity are normal. No adenopathy. There is mild aortic atherosclerosis. There is degenerative change in the thoracic spine. IMPRESSION: Aortic atherosclerosis.  No edema or consolidation. Electronically Signed   By: Lowella Grip III M.D.   On: 01/24/2016 11:32   Ct Angio Chest Pe W And/or Wo Contrast  Result Date: 01/24/2016 CLINICAL DATA:  Right-sided chest pain EXAM: CT  ANGIOGRAPHY CHEST WITH CONTRAST TECHNIQUE: Multidetector CT imaging of the chest was performed using the standard protocol during bolus administration of intravenous contrast. Multiplanar CT image reconstructions and MIPs were obtained to evaluate the vascular anatomy. CONTRAST:  100 mL Isovue 370. COMPARISON:  04/22/2011. FINDINGS: Cardiovascular: Aortic calcifications are noted without aneurysmal dilatation or dissection. Mild coronary calcifications are seen. Pulmonary artery demonstrates a normal branching pattern without intraluminal filling defect to suggest pulmonary embolism. Mediastinum/Nodes: The thoracic inlet is within normal limits. No hilar or mediastinal adenopathy is identified. Lungs/Pleura: The lungs are well aerated bilaterally. Scattered small parenchymal nodules are noted particularly in the right lower lobe the largest of which measures 9 mm on image number 70 of series 6. When compared with the prior exam there relatively stable and likely benign in etiology. Upper Abdomen: There is thickening at the level of the gastroesophageal junction which may be related to reflux. In the duodenum there is a an outpouching of contrast material and air identified with significant inflammatory changes likely representing a contained perforated ulcer this is incompletely evaluated on this exam. Musculoskeletal: Degenerative changes of the thoracic spine are noted. No acute bony abnormality is seen. Review of the MIP images confirms the above findings. IMPRESSION: No evidence of pulmonary emboli. Stable parenchymal nodules in the right lower lobe. Changes suggestive of a perforated duodenal ulcer. Electronically Signed   By: Inez Catalina M.D.   On: 01/24/2016 14:23   Ct Abdomen Pelvis W Contrast  Result Date:  01/24/2016 CLINICAL DATA:  Right side rib cage pain, nausea and vomiting EXAM: CT ABDOMEN AND PELVIS WITH CONTRAST TECHNIQUE: Multidetector CT imaging of the abdomen and pelvis was performed using  the standard protocol following bolus administration of intravenous contrast. CONTRAST:  100 cc Isovue COMPARISON:  CT chest same day FINDINGS: Lower chest: No acute findings in lung bases Hepatobiliary: Enhanced liver is normal. The patient is status post cholecystectomy. CBD measures 7 mm in diameter. Pancreas: Enhanced pancreas is normal. Spleen: Enhanced spleen is normal. Adrenals/Urinary Tract: No adrenal gland mass. Enhanced kidneys are symmetrical in size. No hydronephrosis or hydroureter. The urinary bladder is unremarkable. Delayed renal images shows bilateral renal symmetrical excretion. Stomach/Bowel: There is thickening of proximal duodenal wall. On axial image 24 there is extraluminal contrast collection adjacent/ medial to duodenum and lateral to pancreatic head. On delayed images this collection measures 2.2 cm in diameter. There is increased small amount of extraluminal air on delay images. Findings are consistent with duodenal perforation probable perforated duodenal ulcer. Further correlation with endoscopy is recommended. Sigmoid colon diverticula are noted. There is some thickening of sigmoid colon wall probable due to chronic diverticulosis. No definite evidence of acute diverticulitis. Vascular/Lymphatic: Atherosclerotic calcifications of abdominal aorta and iliac arteries are noted. Reproductive: The patient is status post hysterectomy. Other: No pelvic ascites. No pericecal inflammation. Normal appendix is noted in axial image 47. Musculoskeletal: No destructive bony lesions are noted. Sagittal images of the spine shows degenerative changes thoracolumbar spine. IMPRESSION: 1. There is extraluminal contrast material just medial to duodenum. There is small amount of extraluminal air medial to duodenum. Findings highly suspicious for perforated duodenal ulcer. Clinical correlation is necessary. Correlation with endoscopy and/or surgical consult is recommended. 2. Status postcholecystectomy. 3.  No hydronephrosis or hydroureter. 4. Status post hysterectomy. 5. Degenerative changes thoracic spine. 6. Sigmoid diverticulosis without definite evidence of acute diverticulitis. These results were called by telephone at the time of interpretation on 01/24/2016 at 2:21 pm to Dr. Conni Slipper , who verbally acknowledged these results. Electronically Signed   By: Lahoma Crocker M.D.   On: 01/24/2016 14:22    Anti-infectives: Anti-infectives    Start     Dose/Rate Route Frequency Ordered Stop   01/24/16 2200  ceFAZolin (ANCEF) IVPB 1 g/50 mL premix     1 g 100 mL/hr over 30 Minutes Intravenous Every 6 hours 01/24/16 1709     01/24/16 1500  ceFAZolin (ANCEF) IVPB 2g/100 mL premix     2 g 200 mL/hr over 30 Minutes Intravenous  Once 01/24/16 1453 01/24/16 1606      Assessment/Plan: s/p Procedure(s): Exploratory Laparotomy with biopsy of Pancreas   Sodium remains low at 128 potassium is corrected. 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 Biopsies are currently pending. We will continue with NG tube for now and probably remove the NG tube tomorrow. Will need further workup likely as an outpatient  Florene Glen, MD, FACS  01/25/2016

## 2016-01-25 NOTE — Progress Notes (Signed)
PT was with son at bedside. CH offered prayer and support. OR for AD paperwork explained. PT was receptive to paperwork and son was grateful for the service. [place] available as needed to complete paperwork and other prayer.

## 2016-01-26 LAB — CBC WITH DIFFERENTIAL/PLATELET
BASOS PCT: 0 %
Basophils Absolute: 0 10*3/uL (ref 0–0.1)
EOS PCT: 0 %
Eosinophils Absolute: 0 10*3/uL (ref 0–0.7)
HEMATOCRIT: 31.4 % — AB (ref 35.0–47.0)
Hemoglobin: 10.7 g/dL — ABNORMAL LOW (ref 12.0–16.0)
Lymphocytes Relative: 8 %
Lymphs Abs: 1.4 10*3/uL (ref 1.0–3.6)
MCH: 32.3 pg (ref 26.0–34.0)
MCHC: 34.2 g/dL (ref 32.0–36.0)
MCV: 94.3 fL (ref 80.0–100.0)
MONOS PCT: 5 %
Monocytes Absolute: 1 10*3/uL — ABNORMAL HIGH (ref 0.2–0.9)
NEUTROS ABS: 16.2 10*3/uL — AB (ref 1.4–6.5)
Neutrophils Relative %: 87 %
PLATELETS: 430 10*3/uL (ref 150–440)
RBC: 3.33 MIL/uL — ABNORMAL LOW (ref 3.80–5.20)
RDW: 12.9 % (ref 11.5–14.5)
WBC: 18.7 10*3/uL — ABNORMAL HIGH (ref 3.6–11.0)

## 2016-01-26 LAB — BASIC METABOLIC PANEL
Anion gap: 6 (ref 5–15)
BUN: 5 mg/dL — ABNORMAL LOW (ref 6–20)
CO2: 25 mmol/L (ref 22–32)
CREATININE: 0.74 mg/dL (ref 0.44–1.00)
Calcium: 8.3 mg/dL — ABNORMAL LOW (ref 8.9–10.3)
Chloride: 100 mmol/L — ABNORMAL LOW (ref 101–111)
GFR calc non Af Amer: >60 mL/min (ref >60)
GLUCOSE: 122 mg/dL — AB (ref 65–99)
Potassium: 5 mmol/L (ref 3.5–5.1)
Sodium: 131 mmol/L — ABNORMAL LOW (ref 135–145)

## 2016-01-26 LAB — COMPREHENSIVE METABOLIC PANEL
ALK PHOS: 62 U/L (ref 38–126)
ALT: 12 U/L — ABNORMAL LOW (ref 14–54)
ANION GAP: 15 (ref 5–15)
AST: 25 U/L (ref 15–41)
Albumin: 3.5 g/dL (ref 3.5–5.0)
BILIRUBIN TOTAL: 0.8 mg/dL (ref 0.3–1.2)
BUN: 13 mg/dL (ref 6–20)
CALCIUM: 9.3 mg/dL (ref 8.9–10.3)
CO2: 28 mmol/L (ref 22–32)
Chloride: 83 mmol/L — ABNORMAL LOW (ref 101–111)
Creatinine, Ser: 1.29 mg/dL — ABNORMAL HIGH (ref 0.44–1.00)
GFR calc Af Amer: 46 mL/min — ABNORMAL LOW (ref >60)
GFR, EST NON AFRICAN AMERICAN: 40 mL/min — AB (ref >60)
Glucose, Bld: 111 mg/dL — ABNORMAL HIGH (ref 65–99)
POTASSIUM: 2.8 mmol/L — AB (ref 3.5–5.1)
Sodium: 126 mmol/L — ABNORMAL LOW (ref 135–145)
TOTAL PROTEIN: 7.9 g/dL (ref 6.5–8.1)

## 2016-01-26 LAB — PHOSPHORUS
PHOSPHORUS: 1.3 mg/dL — AB (ref 2.5–4.6)
PHOSPHORUS: 2.3 mg/dL — AB (ref 2.5–4.6)

## 2016-01-26 LAB — MAGNESIUM: MAGNESIUM: 1.6 mg/dL — AB (ref 1.7–2.4)

## 2016-01-26 MED ORDER — SODIUM PHOSPHATES 45 MMOLE/15ML IV SOLN
5.0000 mmol | Freq: Once | INTRAVENOUS | Status: AC
  Administered 2016-01-26: 5 mmol via INTRAVENOUS
  Filled 2016-01-26: qty 1.67

## 2016-01-26 MED ORDER — DEXTROSE-NACL 5-0.9 % IV SOLN
INTRAVENOUS | Status: DC
  Administered 2016-01-26 – 2016-01-28 (×5): via INTRAVENOUS

## 2016-01-26 MED ORDER — POLYVINYL ALCOHOL 1.4 % OP SOLN
1.0000 [drp] | OPHTHALMIC | Status: DC | PRN
  Administered 2016-01-26: 1 [drp] via OPHTHALMIC
  Filled 2016-01-26: qty 15

## 2016-01-26 MED ORDER — CALCIUM CARBONATE ANTACID 500 MG PO CHEW
1000.0000 mg | CHEWABLE_TABLET | Freq: Once | ORAL | Status: AC
  Administered 2016-01-26: 1000 mg via ORAL
  Filled 2016-01-26: qty 5

## 2016-01-26 MED ORDER — SODIUM PHOSPHATES 45 MMOLE/15ML IV SOLN
20.0000 mmol | Freq: Once | INTRAVENOUS | Status: AC
  Administered 2016-01-26: 20 mmol via INTRAVENOUS
  Filled 2016-01-26: qty 6.67

## 2016-01-26 MED ORDER — MAGNESIUM SULFATE 2 GM/50ML IV SOLN
2.0000 g | Freq: Once | INTRAVENOUS | Status: AC
  Administered 2016-01-26: 2 g via INTRAVENOUS
  Filled 2016-01-26: qty 50

## 2016-01-26 MED ORDER — MAGNESIUM OXIDE 400 (241.3 MG) MG PO TABS
400.0000 mg | ORAL_TABLET | Freq: Every day | ORAL | Status: DC
  Administered 2016-01-26 – 2016-01-29 (×4): 400 mg via ORAL
  Filled 2016-01-26 (×4): qty 1

## 2016-01-26 MED ORDER — DIPHENHYDRAMINE HCL 50 MG/ML IJ SOLN
25.0000 mg | Freq: Four times a day (QID) | INTRAMUSCULAR | Status: DC | PRN
  Administered 2016-01-26: 25 mg via INTRAVENOUS
  Filled 2016-01-26: qty 1

## 2016-01-26 NOTE — Progress Notes (Signed)
2 Days Post-Op  Subjective: Ongoing abdominal pain and incisional pain no nausea vomiting she is passing gas.  Objective: Vital signs in last 24 hours: Temp:  [98.4 F (36.9 C)-99.6 F (37.6 C)] 99.6 F (37.6 C) (11/26 0516) Pulse Rate:  [67-83] 83 (11/26 0516) Resp:  [19-20] 19 (11/26 0516) BP: (112-146)/(53-62) 146/62 (11/26 0516) SpO2:  [93 %-97 %] 97 % (11/26 0516) Last BM Date: 01/21/16  Intake/Output from previous day: 11/25 0701 - 11/26 0700 In: 2805.3 [I.V.:2381.3; NG/GT:30; IV Piggyback:394] Out: [id number] [Urine:1225; Emesis/NG output:400; Drains:10] Intake/Output this shift: No intake/output data recorded.  Physical exam:  Awake alert and oriented vital signs are stable abdomen is soft nondistended nontympanitic minimally tender no peritoneal signs calves are nontender wound is clean  Lab Results: CBC   Recent Labs  01/25/16 0612 01/26/16 0722  WBC 12.5* 18.7*  HGB 11.5* 10.7*  HCT 33.3* 31.4*  PLT 428 430   BMET  Recent Labs  01/25/16 0612 01/26/16 0722  NA 128* 131*  K 4.0 5.0  CL 93* 100*  CO2 27 25  GLUCOSE 150* 122*  BUN 8 5*  CREATININE 0.95 0.74  CALCIUM 8.4* 8.3*   PT/INR No results for input(s): LABPROT, INR in the last 72 hours. ABG No results for input(s): PHART, HCO3 in the last 72 hours.  Invalid input(s): PCO2, PO2  Studies/Results: Dg Chest 2 View  Result Date: 01/24/2016 CLINICAL DATA:  Chest pain EXAM: CHEST  2 VIEW COMPARISON:  August 09, 2012 FINDINGS: There is no edema or consolidation. The heart size and pulmonary vascularity are normal. No adenopathy. There is mild aortic atherosclerosis. There is degenerative change in the thoracic spine. IMPRESSION: Aortic atherosclerosis.  No edema or consolidation. Electronically Signed   By: Lowella Grip III M.D.   On: 01/24/2016 11:32   Ct Angio Chest Pe W And/or Wo Contrast  Result Date: 01/24/2016 CLINICAL DATA:  Right-sided chest pain EXAM: CT ANGIOGRAPHY CHEST WITH CONTRAST  TECHNIQUE: Multidetector CT imaging of the chest was performed using the standard protocol during bolus administration of intravenous contrast. Multiplanar CT image reconstructions and MIPs were obtained to evaluate the vascular anatomy. CONTRAST:  100 mL Isovue 370. COMPARISON:  04/22/2011. FINDINGS: Cardiovascular: Aortic calcifications are noted without aneurysmal dilatation or dissection. Mild coronary calcifications are seen. Pulmonary artery demonstrates a normal branching pattern without intraluminal filling defect to suggest pulmonary embolism. Mediastinum/Nodes: The thoracic inlet is within normal limits. No hilar or mediastinal adenopathy is identified. Lungs/Pleura: The lungs are well aerated bilaterally. Scattered small parenchymal nodules are noted particularly in the right lower lobe the largest of which measures 9 mm on image number 70 of series 6. When compared with the prior exam there relatively stable and likely benign in etiology. Upper Abdomen: There is thickening at the level of the gastroesophageal junction which may be related to reflux. In the duodenum there is a an outpouching of contrast material and air identified with significant inflammatory changes likely representing a contained perforated ulcer this is incompletely evaluated on this exam. Musculoskeletal: Degenerative changes of the thoracic spine are noted. No acute bony abnormality is seen. Review of the MIP images confirms the above findings. IMPRESSION: No evidence of pulmonary emboli. Stable parenchymal nodules in the right lower lobe. Changes suggestive of a perforated duodenal ulcer. Electronically Signed   By: Inez Catalina M.D.   On: 01/24/2016 14:23   Ct Abdomen Pelvis W Contrast  Result Date: 01/24/2016 CLINICAL DATA:  Right side rib cage pain, nausea and  vomiting EXAM: CT ABDOMEN AND PELVIS WITH CONTRAST TECHNIQUE: Multidetector CT imaging of the abdomen and pelvis was performed using the standard protocol following  bolus administration of intravenous contrast. CONTRAST:  100 cc Isovue COMPARISON:  CT chest same day FINDINGS: Lower chest: No acute findings in lung bases Hepatobiliary: Enhanced liver is normal. The patient is status post cholecystectomy. CBD measures 7 mm in diameter. Pancreas: Enhanced pancreas is normal. Spleen: Enhanced spleen is normal. Adrenals/Urinary Tract: No adrenal gland mass. Enhanced kidneys are symmetrical in size. No hydronephrosis or hydroureter. The urinary bladder is unremarkable. Delayed renal images shows bilateral renal symmetrical excretion. Stomach/Bowel: There is thickening of proximal duodenal wall. On axial image 24 there is extraluminal contrast collection adjacent/ medial to duodenum and lateral to pancreatic head. On delayed images this collection measures 2.2 cm in diameter. There is increased small amount of extraluminal air on delay images. Findings are consistent with duodenal perforation probable perforated duodenal ulcer. Further correlation with endoscopy is recommended. Sigmoid colon diverticula are noted. There is some thickening of sigmoid colon wall probable due to chronic diverticulosis. No definite evidence of acute diverticulitis. Vascular/Lymphatic: Atherosclerotic calcifications of abdominal aorta and iliac arteries are noted. Reproductive: The patient is status post hysterectomy. Other: No pelvic ascites. No pericecal inflammation. Normal appendix is noted in axial image 47. Musculoskeletal: No destructive bony lesions are noted. Sagittal images of the spine shows degenerative changes thoracolumbar spine. IMPRESSION: 1. There is extraluminal contrast material just medial to duodenum. There is small amount of extraluminal air medial to duodenum. Findings highly suspicious for perforated duodenal ulcer. Clinical correlation is necessary. Correlation with endoscopy and/or surgical consult is recommended. 2. Status postcholecystectomy. 3. No hydronephrosis or hydroureter.  4. Status post hysterectomy. 5. Degenerative changes thoracic spine. 6. Sigmoid diverticulosis without definite evidence of acute diverticulitis. These results were called by telephone at the time of interpretation on 01/24/2016 at 2:21 pm to Dr. Conni Slipper , who verbally acknowledged these results. Electronically Signed   By: Lahoma Crocker M.D.   On: 01/24/2016 14:22    Anti-infectives: Anti-infectives    Start     Dose/Rate Route Frequency Ordered Stop   01/24/16 2200  ceFAZolin (ANCEF) IVPB 1 g/50 mL premix     1 g 100 mL/hr over 30 Minutes Intravenous Every 6 hours 01/24/16 1709     01/24/16 1500  ceFAZolin (ANCEF) IVPB 2g/100 mL premix     2 g 200 mL/hr over 30 Minutes Intravenous  Once 01/24/16 1453 01/24/16 1606      Assessment/Plan: s/p Procedure(s): Exploratory Laparotomy with biopsy of Pancreas   Electrolytes are corrected.  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 I will place orders as such. DC nasogastric tube and start clear liquids today.  Florene Glen, MD, FACS  01/26/2016

## 2016-01-26 NOTE — Progress Notes (Addendum)
[facility] NAME: Elizabeth Bailey    MR#:  [id number]  DATE OF BIRTH:  [birth year]  SUBJECTIVE:  Pain better controlled with increase in Morphine dose Passing flatus this am   REVIEW OF SYSTEMS:    Review of Systems  Constitutional: Negative.  Negative for chills, fever and malaise/fatigue.  HENT: Negative.  Negative for ear discharge, ear pain, hearing loss, nosebleeds and sore throat.   Eyes: Negative.  Negative for blurred vision and pain.  Respiratory: Negative.  Negative for cough, hemoptysis, shortness of breath and wheezing.   Cardiovascular: Negative.  Negative for chest pain, palpitations and leg swelling.  Gastrointestinal: Positive for abdominal pain. Negative for blood in stool, diarrhea, nausea and vomiting.  Genitourinary: Negative.  Negative for dysuria.  Musculoskeletal: Negative for back pain.  Skin: Negative.   Neurological: Negative for dizziness, tremors, speech change, focal weakness, seizures and headaches.  Endo/Heme/Allergies: Negative.  Does not bruise/bleed easily.  Psychiatric/Behavioral: Negative.  Negative for depression, hallucinations and suicidal ideas.    Tolerating Diet: Nothing by mouth      DRUG ALLERGIES:   Allergies  Allergen Reactions  . Cyclobenzaprine     Other reaction(s): Other (See Comments) Frequent urination  . Flucytosine Other (See Comments)    Sore mouth  . Fluticasone-Salmeterol     Other reaction(s): Other (See Comments) Sore mouth  . Naproxen Nausea Only    GI upset  . Other Other (See Comments)    Other reaction(s): Other (See Comments) Frequent urination Other reaction(s): Other (See Comments) Frequent urination Frequent urination    VITALS:  Blood pressure (!) 146/62, pulse 83, temperature 99.6 F (37.6 C), temperature source Oral, resp. rate 19, height 5\' 2"  (1.575 m), weight 66.2 kg (146 lb), SpO2 97 %.  PHYSICAL EXAMINATION:   Physical Exam   Constitutional: She is oriented to person, place, and time and well-developed, well-nourished, and in no distress. No distress.  HENT:  Head: Normocephalic.  NG tube  Eyes: No scleral icterus.  Neck: Normal range of motion. Neck supple. No JVD present. No tracheal deviation present.  Cardiovascular: Normal rate.  Exam reveals no gallop and no friction rub.   Murmur heard. PVCs  Pulmonary/Chest: Effort normal and breath sounds normal. No respiratory distress. She has no wheezes. She has no rales. She exhibits no tenderness.  Abdominal: Soft. She exhibits no distension and no mass. There is no tenderness. There is no rebound and no guarding.  JP drain Mild tenderness but no rebound or guarding   Musculoskeletal: Normal range of motion. She exhibits no edema.  Neurological: She is alert and oriented to person, place, and time.  Skin: Skin is warm. No rash noted. No erythema.  Psychiatric: Affect and judgment normal.      LABORATORY PANEL:   CBC  Recent Labs Lab 01/26/16 0722  WBC 18.7*  HGB 10.7*  HCT 31.4*  PLT 430   ------------------------------------------------------------------------------------------------------------------  Chemistries   Recent Labs Lab 01/24/16 1128  01/26/16 0722  NA 126*  < > 131*  K 2.8*  < > 5.0  CL 83*  < > 100*  CO2 28  < > 25  GLUCOSE 111*  < > 122*  BUN 13  < > 5*  CREATININE 1.29*  < > 0.74  CALCIUM 9.3  < > 8.3*  MG  --   < > 1.6*  AST 25  --   --   ALT 12*  --   --  ALKPHOS 62  --   --   BILITOT <0.1*  --   --   < > = values in this interval not displayed. ------------------------------------------------------------------------------------------------------------------  Cardiac Enzymes  Recent Labs Lab 01/24/16 1109  TROPONINI <0.03   ------------------------------------------------------------------------------------------------------------------  RADIOLOGY:  Dg Chest 2 View  Result Date: 01/24/2016 CLINICAL  DATA:  Chest pain EXAM: CHEST  2 VIEW COMPARISON:  August 09, 2012 FINDINGS: There is no edema or consolidation. The heart size and pulmonary vascularity are normal. No adenopathy. There is mild aortic atherosclerosis. There is degenerative change in the thoracic spine. IMPRESSION: Aortic atherosclerosis.  No edema or consolidation. Electronically Signed   By: Lowella Grip III M.D.   On: 01/24/2016 11:32   Ct Angio Chest Pe W And/or Wo Contrast  Result Date: 01/24/2016 CLINICAL DATA:  Right-sided chest pain EXAM: CT ANGIOGRAPHY CHEST WITH CONTRAST TECHNIQUE: Multidetector CT imaging of the chest was performed using the standard protocol during bolus administration of intravenous contrast. Multiplanar CT image reconstructions and MIPs were obtained to evaluate the vascular anatomy. CONTRAST:  100 mL Isovue 370. COMPARISON:  04/22/2011. FINDINGS: Cardiovascular: Aortic calcifications are noted without aneurysmal dilatation or dissection. Mild coronary calcifications are seen. Pulmonary artery demonstrates a normal branching pattern without intraluminal filling defect to suggest pulmonary embolism. Mediastinum/Nodes: The thoracic inlet is within normal limits. No hilar or mediastinal adenopathy is identified. Lungs/Pleura: The lungs are well aerated bilaterally. Scattered small parenchymal nodules are noted particularly in the right lower lobe the largest of which measures 9 mm on image number 70 of series 6. When compared with the prior exam there relatively stable and likely benign in etiology. Upper Abdomen: There is thickening at the level of the gastroesophageal junction which may be related to reflux. In the duodenum there is a an outpouching of contrast material and air identified with significant inflammatory changes likely representing a contained perforated ulcer this is incompletely evaluated on this exam. Musculoskeletal: Degenerative changes of the thoracic spine are noted. No acute bony  abnormality is seen. Review of the MIP images confirms the above findings. IMPRESSION: No evidence of pulmonary emboli. Stable parenchymal nodules in the right lower lobe. Changes suggestive of a perforated duodenal ulcer. Electronically Signed   By: Inez Catalina M.D.   On: 01/24/2016 14:23   Ct Abdomen Pelvis W Contrast  Result Date: 01/24/2016 CLINICAL DATA:  Right side rib cage pain, nausea and vomiting EXAM: CT ABDOMEN AND PELVIS WITH CONTRAST TECHNIQUE: Multidetector CT imaging of the abdomen and pelvis was performed using the standard protocol following bolus administration of intravenous contrast. CONTRAST:  100 cc Isovue COMPARISON:  CT chest same day FINDINGS: Lower chest: No acute findings in lung bases Hepatobiliary: Enhanced liver is normal. The patient is status post cholecystectomy. CBD measures 7 mm in diameter. Pancreas: Enhanced pancreas is normal. Spleen: Enhanced spleen is normal. Adrenals/Urinary Tract: No adrenal gland mass. Enhanced kidneys are symmetrical in size. No hydronephrosis or hydroureter. The urinary bladder is unremarkable. Delayed renal images shows bilateral renal symmetrical excretion. Stomach/Bowel: There is thickening of proximal duodenal wall. On axial image 24 there is extraluminal contrast collection adjacent/ medial to duodenum and lateral to pancreatic head. On delayed images this collection measures 2.2 cm in diameter. There is increased small amount of extraluminal air on delay images. Findings are consistent with duodenal perforation probable perforated duodenal ulcer. Further correlation with endoscopy is recommended. Sigmoid colon diverticula are noted. There is some thickening of sigmoid colon wall probable due to chronic diverticulosis. No  definite evidence of acute diverticulitis. Vascular/Lymphatic: Atherosclerotic calcifications of abdominal aorta and iliac arteries are noted. Reproductive: The patient is status post hysterectomy. Other: No pelvic ascites. No  pericecal inflammation. Normal appendix is noted in axial image 47. Musculoskeletal: No destructive bony lesions are noted. Sagittal images of the spine shows degenerative changes thoracolumbar spine. IMPRESSION: 1. There is extraluminal contrast material just medial to duodenum. There is small amount of extraluminal air medial to duodenum. Findings highly suspicious for perforated duodenal ulcer. Clinical correlation is necessary. Correlation with endoscopy and/or surgical consult is recommended. 2. Status postcholecystectomy. 3. No hydronephrosis or hydroureter. 4. Status post hysterectomy. 5. Degenerative changes thoracic spine. 6. Sigmoid diverticulosis without definite evidence of acute diverticulitis. These results were called by telephone at the time of interpretation on 01/24/2016 at 2:21 pm to Dr. Conni Slipper , who verbally acknowledged these results. Electronically Signed   By: Lahoma Crocker M.D.   On: 01/24/2016 14:22     ASSESSMENT AND PLAN:   75 year old female postoperative day #2 exploratory laparotomy with biopsy of pancreas with hyponatremia.  1. Hypovolemic hyponatremia: Improved with D5 normal saline BMP for a.m.  2. Electrolytes; Pharmacy consult for repletion.  3. Acute kidney injury in the setting of hypovolemia and surgery: Improved and responded to IV fluids  4. Abdominal pain with findings on CT scan suggestive of perforation medial to the duodenum and pancreatic head mass: Management as per surgery NGT to be removed this am and clear liquid diet  5. PVC: Continue Telemetry Management plans discussed with the patient and she is in agreement.  CODE STATUS: full  TOTAL TIME TAKING CARE OF THIS PATIENT: 28 minutes.   Rounded with nursing staff and discussed with patient's son  POSSIBLE D/C ??, DEPENDING ON CLINICAL CONDITION.   Klani Caridi M.D on 01/26/2016 at 9:16 AM  Between 7am to 6pm - Pager - [phone] After 6pm go to [email or web address] - password [email or web address]  [facility]  Office  [phone]  CC: Primary care physician; Dicky Doe, MD  Note: This dictation was prepared with Dragon dictation along with smaller phrase technology. Any transcriptional errors that result from this process are unintentional.

## 2016-01-26 NOTE — Progress Notes (Signed)
MEDICATION RELATED CONSULT NOTE - INITIAL   Pharmacy Consult for Electrolytes Indication: hypomagnesemia, hypophosphatemia  Allergies  Allergen Reactions  . Cyclobenzaprine     Other reaction(s): Other (See Comments) Frequent urination  . Flucytosine Other (See Comments)    Sore mouth  . Fluticasone-Salmeterol     Other reaction(s): Other (See Comments) Sore mouth  . Naproxen Nausea Only    GI upset  . Other Other (See Comments)    Other reaction(s): Other (See Comments) Frequent urination Other reaction(s): Other (See Comments) Frequent urination Frequent urination   Patient Measurements: Height: 5\' 2"  (157.5 cm) Weight: 146 lb (66.2 kg) IBW/kg (Calculated) : 50.1  Vital Signs: Temp: 99.5 F (37.5 C) (11/26 1409) Temp Source: Oral (11/26 1409) BP: 139/58 (11/26 1409) Pulse Rate: 78 (11/26 1409) Intake/Output from previous day: 11/25 0701 - 11/26 0700 In: 2805.3 [I.V.:2381.3; NG/GT:30; IV Piggyback:394] Out: [id number] [Urine:1225; Emesis/NG output:400; Drains:10] Intake/Output from this shift: Total I/O In: 344 [I.V.:344] Out: -   Labs:  Recent Labs  01/24/16 1128 01/24/16 2101 01/25/16 0612 01/26/16 0722 01/26/16 2048  WBC 10.7  --  12.5* 18.7*  --   HGB 12.2  --  11.5* 10.7*  --   HCT 35.0  --  33.3* 31.4*  --   PLT 484*  --  428 430  --   CREATININE 1.29* 1.05* 0.95 0.74  --   MG  --  1.5* 2.1 1.6*  --   PHOS  --   --  2.1* 1.3* 2.3*  ALBUMIN 3.5  --   --   --   --   PROT 7.9  --   --   --   --   AST 25  --   --   --   --   ALT 12*  --   --   --   --   ALKPHOS 62  --   --   --   --   BILITOT 0.8  --   --   --   --    Estimated Creatinine Clearance: 55 mL/min (by C-G formula based on SCr of 0.74 mg/dL).  Assessment: Pharmacy consulted to monitor and replace electrolytes as needed in this 75 year old female admitted with a pancreatic mass who is post-op day #1 from exploratory laparotomy and biopsy.  K= 5 on maintenance fluids D5/0.9% NaCl with KCl  40 mEq/L at 100 mL/hr  Mg = 1.6 Phos = 1.3 Ca = 8.3  Goal of Therapy: Electrolytes WNL  Plan:  Potassium has been removed from maintenance fluids - no replacement needed at this time.  Magnesium 2 g IV once then mag-ox 400 mg PO daily Calcium carbonate 1000 mg elemental calcium once Sodium phosphate 20 mmol IV once  Will recheck phosphorus tonight and all other electrolytes tomorrow morning.  11/26 2100 Phosphorus recheck, resulted at 2.3. Will give NaPhos 5 mmol IV once and recheck with AM labs.  Paulina Fusi, PharmD, BCPS 01/26/2016 9:42 PM

## 2016-01-26 NOTE — Progress Notes (Signed)
MEDICATION RELATED CONSULT NOTE - INITIAL   Pharmacy Consult for Electrolytes Indication: hypomagnesemia, hypophosphatemia  Allergies  Allergen Reactions  . Cyclobenzaprine     Other reaction(s): Other (See Comments) Frequent urination  . Flucytosine Other (See Comments)    Sore mouth  . Fluticasone-Salmeterol     Other reaction(s): Other (See Comments) Sore mouth  . Naproxen Nausea Only    GI upset  . Other Other (See Comments)    Other reaction(s): Other (See Comments) Frequent urination Other reaction(s): Other (See Comments) Frequent urination Frequent urination   Patient Measurements: Height: 5\' 2"  (157.5 cm) Weight: 146 lb (66.2 kg) IBW/kg (Calculated) : 50.1  Vital Signs: Temp: 99.6 F (37.6 C) (11/26 0516) Temp Source: Oral (11/26 0516) BP: 146/62 (11/26 0516) Pulse Rate: 83 (11/26 0516) Intake/Output from previous day: 11/25 0701 - 11/26 0700 In: 2805.3 [I.V.:2381.3; NG/GT:30; IV Piggyback:394] Out: [id number] [Urine:1225; Emesis/NG output:400; Drains:10] Intake/Output from this shift: No intake/output data recorded.  Labs:  Recent Labs  01/24/16 1128 01/24/16 2101 01/25/16 0612 01/26/16 0722  WBC 10.7  --  12.5* 18.7*  HGB 12.2  --  11.5* 10.7*  HCT 35.0  --  33.3* 31.4*  PLT 484*  --  428 430  CREATININE 1.29* 1.05* 0.95 0.74  MG  --  1.5* 2.1 1.6*  PHOS  --   --  2.1* 1.3*  ALBUMIN 3.5  --   --   --   PROT 7.9  --   --   --   AST 25  --   --   --   ALT 12*  --   --   --   ALKPHOS 62  --   --   --   BILITOT <0.1*  --   --   --    Estimated Creatinine Clearance: 55 mL/min (by C-G formula based on SCr of 0.74 mg/dL).  Assessment: Pharmacy consulted to monitor and replace electrolytes as needed in this 75 year old female admitted with a pancreatic mass who is post-op day #1 from exploratory laparotomy and biopsy.  K= 5 on maintenance fluids D5/0.9% NaCl with KCl 40 mEq/L at 100 mL/hr  Mg = 1.6 Phos = 1.3 Ca = 8.3  Goal of Therapy:  Electrolytes WNL  Plan:  Potassium has been removed from maintenance fluids - no replacement needed at this time.  Magnesium 2 g IV once then mag-ox 400 mg PO daily Calcium carbonate 1000 mg elemental calcium once Sodium phosphate 20 mmol IV once  Will recheck phosphorus tonight and all other electrolytes tomorrow morning.  Lenis Noon, PharmD, BCPS Clinical Pharmacist 01/26/2016,9:17 AM

## 2016-01-27 ENCOUNTER — Encounter: Payer: Self-pay | Admitting: Surgery

## 2016-01-27 ENCOUNTER — Inpatient Hospital Stay: Payer: Medicare Other

## 2016-01-27 LAB — BASIC METABOLIC PANEL
ANION GAP: 5 (ref 5–15)
BUN: <5 mg/dL — ABNORMAL LOW (ref 6–20)
CALCIUM: 7.9 mg/dL — AB (ref 8.9–10.3)
CO2: 23 mmol/L (ref 22–32)
Chloride: 99 mmol/L — ABNORMAL LOW (ref 101–111)
Creatinine, Ser: 0.73 mg/dL (ref 0.44–1.00)
Glucose, Bld: 116 mg/dL — ABNORMAL HIGH (ref 65–99)
POTASSIUM: 3.8 mmol/L (ref 3.5–5.1)
SODIUM: 127 mmol/L — AB (ref 135–145)

## 2016-01-27 LAB — CA 125: CA 125: 23.7 U/mL (ref 0.0–38.1)

## 2016-01-27 LAB — CEA: CEA: 1.7 ng/mL (ref 0.0–4.7)

## 2016-01-27 LAB — CA 19-9 (SERIAL): CA 19 9: 9 U/mL (ref 0–35)

## 2016-01-27 LAB — PHOSPHORUS: PHOSPHORUS: 2.5 mg/dL (ref 2.5–4.6)

## 2016-01-27 LAB — MAGNESIUM: MAGNESIUM: 1.8 mg/dL (ref 1.7–2.4)

## 2016-01-27 MED ORDER — OXYCODONE HCL 5 MG PO TABS
5.0000 mg | ORAL_TABLET | ORAL | Status: DC | PRN
  Administered 2016-01-28 – 2016-01-30 (×8): 10 mg via ORAL
  Administered 2016-01-31: 5 mg via ORAL
  Filled 2016-01-27 (×4): qty 2
  Filled 2016-01-27: qty 1
  Filled 2016-01-27 (×4): qty 2

## 2016-01-27 MED ORDER — HYDROMORPHONE HCL 1 MG/ML IJ SOLN
0.5000 mg | INTRAMUSCULAR | Status: DC | PRN
  Administered 2016-01-27 – 2016-01-29 (×7): 0.5 mg via INTRAVENOUS
  Filled 2016-01-27 (×7): qty 1

## 2016-01-27 MED ORDER — GADOBENATE DIMEGLUMINE 529 MG/ML IV SOLN
15.0000 mL | Freq: Once | INTRAVENOUS | Status: AC | PRN
  Administered 2016-01-27: 13 mL via INTRAVENOUS

## 2016-01-27 NOTE — Progress Notes (Signed)
Per Dr. Benjie Karvonen okay to discontinue cont pulse ox. Also notified MD that tele had called for irregular rhythm that resembeled afobb but was not sure. PT was working with PT at this time.

## 2016-01-27 NOTE — Progress Notes (Signed)
01/27/2016  Subjective: Patient is 3 Days Post-Op status post expiratory laparotomy. Patient reports she still having pain over the midline incision. Reports mild nausea but has been tolerating a clear liquid diet.  Vital signs: Temp:  [98.2 F (36.8 C)-98.6 F (37 C)] 98.3 F (36.8 C) (11/27 1419) Pulse Rate:  [71-78] 71 (11/27 1419) Resp:  [16-19] 16 (11/27 1419) BP: (136-156)/(56-66) 136/58 (11/27 1419) SpO2:  [91 %-93 %] 92 % (11/27 1419)   Intake/Output: 11/26 0701 - 11/27 0700 In: 2988.7 [P.O.:60; I.V.:2146.7; IV Piggyback:782] Out: [id number] [Urine:1525; Drains:20] Last BM Date: 01/21/16  Physical Exam: Constitutional: No acute distress Abdomen: Soft, nondistended, with tenderness to palpation in the midline incision as well as the right side where the JP drain is coming from. JP is serosanguineous in nature. Incisions clean dry and intact.  Labs:   Recent Labs  01/25/16 0612 01/26/16 0722  WBC 12.5* 18.7*  HGB 11.5* 10.7*  HCT 33.3* 31.4*  PLT 428 430    Recent Labs  01/26/16 0722 01/27/16 0531  NA 131* 127*  K 5.0 3.8  CL 100* 99*  CO2 25 23  GLUCOSE 122* 116*  BUN 5* <5*  CREATININE 0.74 0.73  CALCIUM 8.3* 7.9*   No results for input(s): LABPROT, INR in the last 72 hours.   Assessment/Plan: 75 year old female status post expiratory laparotomy found to have a possible pancreatic mass status post OR biopsy  -Patient will have an MRCP today and noted to better elucidate this pancreatic mass. She also has a GI consult pending for further evaluation. Pending on these results she may require referral to tertiary care center. -Make nothing by mouth for the MRI and then resume clear liquid diet afterwards. -PT consult.  -Pain control.   Melvyn Neth, [place]

## 2016-01-27 NOTE — Progress Notes (Signed)
MEDICATION RELATED CONSULT NOTE - INITIAL   Pharmacy Consult for Electrolytes Indication: hypomagnesemia, hypophosphatemia  Allergies  Allergen Reactions  . Cyclobenzaprine     Other reaction(s): Other (See Comments) Frequent urination  . Flucytosine Other (See Comments)    Sore mouth  . Fluticasone-Salmeterol     Other reaction(s): Other (See Comments) Sore mouth  . Naproxen Nausea Only    GI upset  . Other Other (See Comments)    Other reaction(s): Other (See Comments) Frequent urination Other reaction(s): Other (See Comments) Frequent urination Frequent urination   Patient Measurements: Height: 5\' 2"  (157.5 cm) Weight: 146 lb (66.2 kg) IBW/kg (Calculated) : 50.1  Vital Signs: Temp: 98.2 F (36.8 C) (11/27 0445) Temp Source: Oral (11/27 0445) BP: 141/57 (11/27 0445) Pulse Rate: 78 (11/27 0445) Intake/Output from previous day: 11/26 0701 - 11/27 0700 In: 2988.7 [P.O.:60; I.V.:2146.7; IV Piggyback:782] Out: [id number] [Urine:1525; Drains:20] Intake/Output from this shift: No intake/output data recorded.  Labs:  Recent Labs  01/24/16 1128  01/25/16 0612 01/26/16 0722 01/26/16 2048 01/27/16 0531  WBC 10.7  --  12.5* 18.7*  --   --   HGB 12.2  --  11.5* 10.7*  --   --   HCT 35.0  --  33.3* 31.4*  --   --   PLT 484*  --  428 430  --   --   CREATININE 1.29*  < > 0.95 0.74  --  0.73  MG  --   < > 2.1 1.6*  --  1.8  PHOS  --   < > 2.1* 1.3* 2.3* 2.5  ALBUMIN 3.5  --   --   --   --   --   PROT 7.9  --   --   --   --   --   AST 25  --   --   --   --   --   ALT 12*  --   --   --   --   --   ALKPHOS 62  --   --   --   --   --   BILITOT 0.8  --   --   --   --   --   < > = values in this interval not displayed. Estimated Creatinine Clearance: 55 mL/min (by C-G formula based on SCr of 0.73 mg/dL).  Assessment: Pharmacy consulted to monitor and replace electrolytes as needed in this 75 year old female admitted with a pancreatic mass who is post-op day #1 from  exploratory laparotomy and biopsy.  Goal of Therapy: Electrolytes WNL  Plan:  11/27 AM Mg: 1.8   Phos: 2.5  K:3.8 No supplementation needed at this time. Will recheck electrolytes with am labs.   Nancy Fetter, PharmD, BCPS  Clinical Pharmacist 01/27/2016 7:21 AM

## 2016-01-27 NOTE — Plan of Care (Signed)
Problem: Pain Managment: Goal: General experience of comfort will improve Outcome: Not Progressing Is having pain.

## 2016-01-27 NOTE — Progress Notes (Signed)
Notified Dr. Hampton Abbot that patient has outpatient breast biopsy scheduled tomorrow. Per MD have pt to reschedule at a later date.

## 2016-01-27 NOTE — Evaluation (Signed)
Physical Therapy Evaluation Patient Details Name: Elizabeth Bailey MRN: [id number] DOB: January 09, 1941 Today's Date: 01/27/2016   History of Present Illness  Pt is a 75 y.o. female presenting to hospital with epigastric pain x3 weeks.  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  Pt also with hyponatremia and significant abdominal surgical site pain during hospitalization.  PMH includes arthritis and chronic bronchitis.  Clinical Impression  Prior to hospital admission, pt was independent.  Pt lives alone in 1 level home with stairs to enter.  Currently pt is mod to max assist supine to sit via logrolling; mod assist sit to stand; and min assist taking a few steps bed to recliner.  Pt holding pillow over abdominal incision entire session for pain control (pt also given IV pain meds prior to PT session); pain 6/10 abdominal incision beginning and end of session.  Pt requiring extra time for mobility and also extra cueing and modifications for pain control during session.  Pt would benefit from skilled PT to address noted impairments and functional limitations.  D/t pt's current assist levels, recommend pt discharge to STR when medically appropriate.    Follow Up Recommendations SNF    Equipment Recommendations   (TBD)    Recommendations for Other Services       Precautions / Restrictions Precautions Precautions: Fall Precaution Comments: abdominal JP drain; long abdominal incision Restrictions Weight Bearing Restrictions: No      Mobility  Bed Mobility Overal bed mobility: Needs Assistance Bed Mobility: Rolling;Sidelying to Sit Rolling: Mod assist;Max assist (to R) Sidelying to sit: Mod assist;Max assist       General bed mobility comments: vc's for logrolling technique and bending knees to assist with protecting abdominal incision; assist for trunk and B LE's  Transfers Overall  transfer level: Needs assistance Equipment used: None Transfers: Sit to/from Stand Sit to Stand: Mod assist         General transfer comment: assist and vc's to come to upright position  Ambulation/Gait Ambulation/Gait assistance: Min assist Ambulation Distance (Feet): 3 Feet (bed to recliner) Assistive device: None   Gait velocity: decreased   General Gait Details: decreased B step length/foot clearance/heelstrike; limited distance d/t abdominal pain (pt holding pillow over abdominal incision entire time)  Stairs            Wheelchair Mobility    Modified Rankin (Stroke Patients Only)       Balance Overall balance assessment: Needs assistance Sitting-balance support: No upper extremity supported;Feet supported (B UE's holding pillow against abdominal incision) Sitting balance-Leahy Scale: Fair     Standing balance support: No upper extremity supported Standing balance-Leahy Scale: Fair Standing balance comment: static standing                             Pertinent Vitals/Pain Pain Assessment: 0-10 Pain Score: 6  (beginning and end of session) Pain Location: abdominal incision Pain Descriptors / Indicators: Operative site guarding;Sore;Tender Pain Intervention(s): Limited activity within patient's tolerance;Monitored during session;Premedicated before session;Repositioned  Vitals (HR and O2) stable and WFL throughout treatment session.    Home Living Family/patient expects to be discharged to:: Private residence Living Arrangements: Alone   Type of Home: House Home Access: Stairs to enter Entrance Stairs-Rails: None Entrance Stairs-Number of Steps: 2 Home Layout: One level Home Equipment: None      Prior Function Level of Independence:  Independent         Comments: Pt denies any falls in past 6 months.     Hand Dominance        Extremity/Trunk Assessment   Upper Extremity Assessment: Generalized weakness           Lower  Extremity Assessment: Generalized weakness         Communication   Communication: HOH  Cognition Arousal/Alertness: Awake/alert Behavior During Therapy: Anxious Overall Cognitive Status: Within Functional Limits for tasks assessed                      General Comments General comments (skin integrity, edema, etc.): Nursing gave pt IV morphine prior to PT session for pain control.  Pt's son present during session.  Nursing cleared pt for participation in physical therapy.  Pt agreeable to PT session with encouragement of her son and also receiving pain meds prior to getting OOB (PT returned for therapy session after pain meds).    Exercises     Assessment/Plan    PT Assessment Patient needs continued PT services  PT Problem List Decreased strength;Decreased activity tolerance;Decreased balance;Decreased mobility;Decreased knowledge of precautions;Pain          PT Treatment Interventions DME instruction;Gait training;Stair training;Functional mobility training;Therapeutic activities;Therapeutic exercise;Balance training;Patient/family education    PT Goals (Current goals can be found in the Care Plan section)  Acute Rehab PT Goals Patient Stated Goal: to have less pain PT Goal Formulation: With patient Time For Goal Achievement: 02/10/16 Potential to Achieve Goals: Fair    Frequency Min 2X/week   Barriers to discharge Decreased caregiver support      Co-evaluation               End of Session Equipment Utilized During Treatment: Gait belt (up high) Activity Tolerance: Patient limited by pain Patient left: in chair;with call bell/phone within reach;with chair alarm set;with nursing/sitter in room;with family/visitor present Nurse Communication: Mobility status;Precautions (Pt's pain levels)         Time: [id number] PT Time Calculation (min) (ACUTE ONLY): 32 min   Charges:   PT Evaluation $PT Eval Low Complexity: 1 Procedure PT  Treatments $Therapeutic Activity: 8-22 mins   PT G CodesLeitha Bleak 08-Feb-2016, 3:35 PM Leitha Bleak, [place]

## 2016-01-27 NOTE — Progress Notes (Addendum)
[place] [facility] [place] NAME: Elizabeth Bailey    MR#:  [id number]  [place]:  11/08/1940  SUBJECTIVE:  Still having significant amount of pain from surgical site. Tolerating clear liquid diet   REVIEW OF SYSTEMS:    Review of Systems  Constitutional: Negative.  Negative for chills, fever and malaise/fatigue.  HENT: Negative.  Negative for ear discharge, ear pain, hearing loss, nosebleeds and sore throat.   Eyes: Negative.  Negative for blurred vision and pain.  Respiratory: Negative.  Negative for cough, hemoptysis, shortness of breath and wheezing.   Cardiovascular: Negative.  Negative for chest pain, palpitations and leg swelling.  Gastrointestinal: Positive for abdominal pain. Negative for blood in stool, diarrhea, nausea and vomiting.  Genitourinary: Negative.  Negative for dysuria.  Musculoskeletal: Negative for back pain.  Skin: Negative.   Neurological: Negative for dizziness, tremors, speech change, focal weakness, seizures and headaches.  Endo/Heme/Allergies: Negative.  Does not bruise/bleed easily.  Psychiatric/Behavioral: Negative.  Negative for depression, hallucinations and suicidal ideas.    Tolerating Diet: Clear liquid diet      DRUG ALLERGIES:   Allergies  Allergen Reactions  . Cyclobenzaprine     Other reaction(s): Other (See Comments) Frequent urination  . Flucytosine Other (See Comments)    Sore mouth  . Fluticasone-Salmeterol     Other reaction(s): Other (See Comments) Sore mouth  . Naproxen Nausea Only    GI upset  . Other Other (See Comments)    Other reaction(s): Other (See Comments) Frequent urination Other reaction(s): Other (See Comments) Frequent urination Frequent urination    VITALS:  Blood pressure (!) 141/57, pulse 78, temperature 98.2 F (36.8 C), temperature source Oral, resp. rate 19, height 5\' 2"  (1.575 m), weight 66.2 kg (146 lb), SpO2 91 %.  PHYSICAL EXAMINATION:   Physical Exam   Constitutional: She is oriented to person, place, and time and well-developed, well-nourished, and in no distress. No distress.  HENT:  Head: Normocephalic.  NG tube  Eyes: No scleral icterus.  Neck: Normal range of motion. Neck supple. No JVD present. No tracheal deviation present.  Cardiovascular: Normal rate and regular rhythm.  Exam reveals no gallop and no friction rub.   Murmur heard. Pulmonary/Chest: Effort normal and breath sounds normal. No respiratory distress. She has no wheezes. She has no rales. She exhibits no tenderness.  Abdominal: Soft. She exhibits no distension and no mass. There is no tenderness. There is no rebound and no guarding.  JP drain Mild tenderness but no rebound or guarding   Musculoskeletal: Normal range of motion. She exhibits no edema.  Neurological: She is alert and oriented to person, place, and time.  Skin: Skin is warm. No rash noted. No erythema.  Psychiatric: Affect and judgment normal.      LABORATORY PANEL:   CBC  Recent Labs Lab 01/26/16 0722  WBC 18.7*  HGB 10.7*  HCT 31.4*  PLT 430   ------------------------------------------------------------------------------------------------------------------  Chemistries   Recent Labs Lab 01/24/16 1128  01/27/16 0531  NA 126*  < > 127*  K 2.8*  < > 3.8  CL 83*  < > 99*  CO2 28  < > 23  GLUCOSE 111*  < > 116*  BUN 13  < > <5*  CREATININE 1.29*  < > 0.73  CALCIUM 9.3  < > 7.9*  MG  --   < > 1.8  AST 25  --   --   ALT 12*  --   --  ALKPHOS 62  --   --   BILITOT 0.8  --   --   < > = values in this interval not displayed. ------------------------------------------------------------------------------------------------------------------  Cardiac Enzymes  Recent Labs Lab 01/24/16 1109  TROPONINI <0.03   ------------------------------------------------------------------------------------------------------------------  RADIOLOGY:  Mr 3d Recon At Scanner  Result Date:  01/27/2016 CLINICAL DATA:  Evaluate pancreatic mass. History of perforated duodenal ulcer and pancreatic mass seen and surgery. EXAM: MRI ABDOMEN WITHOUT AND WITH CONTRAST (INCLUDING MRCP) TECHNIQUE: Multiplanar multisequence MR imaging of the abdomen was performed both before and after the administration of intravenous contrast. Heavily T2-weighted images of the biliary and pancreatic ducts were obtained, and three-dimensional MRCP images were rendered by post processing. CONTRAST:  7mL MULTIHANCE GADOBENATE DIMEGLUMINE 529 MG/ML IV SOLN COMPARISON:  CT scan 01/24/2016 FINDINGS: Lower chest: Small bilateral pleural effusions and overlying atelectasis. No pericardial effusion. Hepatobiliary: No focal hepatic lesions or intrahepatic biliary dilatation. Normal caliber and course of the common bile duct. No common bile duct stones, mass or obstruction. The gallbladder is surgically absent. Pancreas: Very limited examination due to motion artifact. Indistinct soft tissue fullness noted along the posterior aspect of the lower pancreatic head adjacent to the duodenum but very difficult to identified and measure a discrete mass. Normal caliber and course of the main pancreatic duct. The pancreatic body and tail show mild atrophy but no mass. Spleen:  Normal size.  No focal lesions. Adrenals/Urinary Tract:  No significant findings. Stomach/Bowel: The stomach is moderately distended. There is persistent inflammatory type changes involving the second portion duodenum along with a moderate-sized duodenum diverticulum. No new adrenal obstruction. Vascular/Lymphatic: The major vascular structures are patent. The aorta and branch vessels are unremarkable. There are small gastrohepatic ligament and periportal lymph nodes. Other:  Small amount of free abdominal fluid. Musculoskeletal: No significant findings. IMPRESSION: 1. Limited examination due to patient motion. 2. Ill-defined soft tissue fullness between the pancreatic head  and duodenum likely reflecting the mass abuts identified at surgery. Dedicated pancreatic CT protocol may be helpful for further evaluation and surgical planning. 3. Small gastrohepatic ligament periportal and celiac axis lymph nodes are indeterminate. 4. Distended stomach but no obvious doing renal obstruction. Moderate-sized duodenum diverticulum. 5. No definite MR findings for metastatic disease. 6. Bilateral pleural effusions and mild to moderate free abdominal fluid. Electronically Signed   By: Marijo Sanes M.D.   On: 01/27/2016 11:55   Mr Abdomen Mrcp Moise Boring Contast  Result Date: 01/27/2016 CLINICAL DATA:  Evaluate pancreatic mass. History of perforated duodenal ulcer and pancreatic mass seen and surgery. EXAM: MRI ABDOMEN WITHOUT AND WITH CONTRAST (INCLUDING MRCP) TECHNIQUE: Multiplanar multisequence MR imaging of the abdomen was performed both before and after the administration of intravenous contrast. Heavily T2-weighted images of the biliary and pancreatic ducts were obtained, and three-dimensional MRCP images were rendered by post processing. CONTRAST:  7mL MULTIHANCE GADOBENATE DIMEGLUMINE 529 MG/ML IV SOLN COMPARISON:  CT scan 01/24/2016 FINDINGS: Lower chest: Small bilateral pleural effusions and overlying atelectasis. No pericardial effusion. Hepatobiliary: No focal hepatic lesions or intrahepatic biliary dilatation. Normal caliber and course of the common bile duct. No common bile duct stones, mass or obstruction. The gallbladder is surgically absent. Pancreas: Very limited examination due to motion artifact. Indistinct soft tissue fullness noted along the posterior aspect of the lower pancreatic head adjacent to the duodenum but very difficult to identified and measure a discrete mass. Normal caliber and course of the main pancreatic duct. The pancreatic body and tail show mild atrophy but  no mass. Spleen:  Normal size.  No focal lesions. Adrenals/Urinary Tract:  No significant findings.  Stomach/Bowel: The stomach is moderately distended. There is persistent inflammatory type changes involving the second portion duodenum along with a moderate-sized duodenum diverticulum. No new adrenal obstruction. Vascular/Lymphatic: The major vascular structures are patent. The aorta and branch vessels are unremarkable. There are small gastrohepatic ligament and periportal lymph nodes. Other:  Small amount of free abdominal fluid. Musculoskeletal: No significant findings. IMPRESSION: 1. Limited examination due to patient motion. 2. Ill-defined soft tissue fullness between the pancreatic head and duodenum likely reflecting the mass abuts identified at surgery. Dedicated pancreatic CT protocol may be helpful for further evaluation and surgical planning. 3. Small gastrohepatic ligament periportal and celiac axis lymph nodes are indeterminate. 4. Distended stomach but no obvious doing renal obstruction. Moderate-sized duodenum diverticulum. 5. No definite MR findings for metastatic disease. 6. Bilateral pleural effusions and mild to moderate free abdominal fluid. Electronically Signed   By: Marijo Sanes M.D.   On: 01/27/2016 11:55     ASSESSMENT AND PLAN:   75 year old female postoperative day #3 exploratory laparotomy with biopsy of pancreas with hyponatremia.  1. Hypovolemic hyponatremia: Sodium decreased today I would continue current treatment with D5 normal saline follow-up BMP in a.m.   2. Electrolytes; Pharmacy consult for repletion.  3. Acute kidney injury in the setting of hypovolemia and surgery: Improved and responded to IV fluids  4. Abdominal pain with findings on CT scan suggestive of perforation medial to the duodenum and pancreatic head mass: Management as per surgery MRI for today   Ok to d/c tele no arrhythmias found Management plans discussed with the patient and she is in agreement.  CODE STATUS: full  Discussed with surgery and nursing Discussed with patient's son TOTAL  TIME TAKING CARE OF THIS PATIENT: 23 minutes.    POSSIBLE D/C ??, DEPENDING ON CLINICAL CONDITION.   Kateleen Encarnacion M.D on 01/27/2016 at 12:04 PM  Between 7am to 6pm - Pager - [phone] After 6pm go to [email or web address] - password [email or web address] [facility]  Office  [phone]  CC: Primary care physician; Dicky Doe, MD  Note: This dictation was prepared with Dragon dictation along with smaller phrase technology. Any transcriptional errors that result from this process are unintentional.

## 2016-01-28 ENCOUNTER — Ambulatory Visit: Payer: Medicare Other

## 2016-01-28 LAB — CBC WITH DIFFERENTIAL/PLATELET
Basophils Absolute: 0.1 10*3/uL (ref 0–0.1)
Basophils Relative: 1 %
Eosinophils Absolute: 0.2 10*3/uL (ref 0–0.7)
Eosinophils Relative: 2 %
HEMATOCRIT: 29.8 % — AB (ref 35.0–47.0)
Hemoglobin: 10.3 g/dL — ABNORMAL LOW (ref 12.0–16.0)
LYMPHS PCT: 13 %
Lymphs Abs: 1.8 10*3/uL (ref 1.0–3.6)
MCH: 33.1 pg (ref 26.0–34.0)
MCHC: 34.7 g/dL (ref 32.0–36.0)
MCV: 95.4 fL (ref 80.0–100.0)
MONO ABS: 0.8 10*3/uL (ref 0.2–0.9)
MONOS PCT: 6 %
NEUTROS ABS: 10.6 10*3/uL — AB (ref 1.4–6.5)
Neutrophils Relative %: 78 %
Platelets: 423 10*3/uL (ref 150–440)
RBC: 3.12 MIL/uL — ABNORMAL LOW (ref 3.80–5.20)
RDW: 12.9 % (ref 11.5–14.5)
WBC: 13.5 10*3/uL — ABNORMAL HIGH (ref 3.6–11.0)

## 2016-01-28 LAB — COMPREHENSIVE METABOLIC PANEL
ALT: 9 U/L — ABNORMAL LOW (ref 14–54)
ANION GAP: 8 (ref 5–15)
AST: 21 U/L (ref 15–41)
Albumin: 2.4 g/dL — ABNORMAL LOW (ref 3.5–5.0)
Alkaline Phosphatase: 58 U/L (ref 38–126)
BILIRUBIN TOTAL: 0.5 mg/dL (ref 0.3–1.2)
BUN: <5 mg/dL — ABNORMAL LOW (ref 6–20)
CO2: 24 mmol/L (ref 22–32)
Calcium: 8.1 mg/dL — ABNORMAL LOW (ref 8.9–10.3)
Chloride: 95 mmol/L — ABNORMAL LOW (ref 101–111)
Creatinine, Ser: 0.67 mg/dL (ref 0.44–1.00)
Glucose, Bld: 114 mg/dL — ABNORMAL HIGH (ref 65–99)
POTASSIUM: 3.3 mmol/L — AB (ref 3.5–5.1)
Sodium: 127 mmol/L — ABNORMAL LOW (ref 135–145)
TOTAL PROTEIN: 6.3 g/dL — AB (ref 6.5–8.1)

## 2016-01-28 LAB — PHOSPHORUS: PHOSPHORUS: 2.3 mg/dL — AB (ref 2.5–4.6)

## 2016-01-28 LAB — MAGNESIUM: Magnesium: 1.6 mg/dL — ABNORMAL LOW (ref 1.7–2.4)

## 2016-01-28 MED ORDER — K PHOS MONO-SOD PHOS DI & MONO 155-852-130 MG PO TABS
500.0000 mg | ORAL_TABLET | ORAL | Status: AC
  Administered 2016-01-28 (×2): 500 mg via ORAL
  Filled 2016-01-28 (×2): qty 2

## 2016-01-28 MED ORDER — MAGNESIUM SULFATE 2 GM/50ML IV SOLN
2.0000 g | Freq: Once | INTRAVENOUS | Status: AC
  Administered 2016-01-28: 2 g via INTRAVENOUS
  Filled 2016-01-28: qty 50

## 2016-01-28 MED ORDER — KCL IN DEXTROSE-NACL 20-5-0.9 MEQ/L-%-% IV SOLN
INTRAVENOUS | Status: DC
  Administered 2016-01-28 – 2016-01-30 (×4): via INTRAVENOUS
  Filled 2016-01-28 (×8): qty 1000

## 2016-01-28 MED ORDER — POTASSIUM CHLORIDE CRYS ER 20 MEQ PO TBCR
40.0000 meq | EXTENDED_RELEASE_TABLET | Freq: Once | ORAL | Status: AC
  Administered 2016-01-28: 40 meq via ORAL
  Filled 2016-01-28: qty 2

## 2016-01-28 MED ORDER — BOOST / RESOURCE BREEZE PO LIQD
1.0000 | Freq: Three times a day (TID) | ORAL | Status: DC
  Administered 2016-01-28 – 2016-01-31 (×6): 1 via ORAL

## 2016-01-28 NOTE — Progress Notes (Signed)
01/28/2016  Subjective: Patient is 4 Days Post-Op status post exploratory laparotomy. Patient reports that her pain is better today compared to yesterday. However she still having intermittent nausea and no flatus yet. Pathology results not back yet  Vital signs: Temp:  [97.9 F (36.6 C)-98.3 F (36.8 C)] 97.9 F (36.6 C) (11/28 0452) Pulse Rate:  [71-78] 78 (11/28 0933) Resp:  [16-17] 17 (11/28 0452) BP: (130-156)/(58-97) 132/97 (11/28 0933) SpO2:  [91 %-92 %] 92 % (11/28 0452)   Intake/Output: 11/27 0701 - 11/28 0700 In: 2122 [P.O.:180; I.V.:1480; IV Piggyback:462] Out: [id number] [Urine:1500; Drains:15] Last BM Date: 01/20/16  Physical Exam: Constitutional: No acute distress Abdomen: Soft, mildly distended, appropriately tender to palpation. Midline incision is clean dry and intact with staples. Right-sided JP drain with serous fluid  Labs:   Recent Labs  01/26/16 0722 01/28/16 0510  WBC 18.7* 13.5*  HGB 10.7* 10.3*  HCT 31.4* 29.8*  PLT 430 423    Recent Labs  01/27/16 0531 01/28/16 0510  NA 127* 127*  K 3.8 3.3*  CL 99* 95*  CO2 23 24  GLUCOSE 116* 114*  BUN <5* <5*  CREATININE 0.73 0.67  CALCIUM 7.9* 8.1*   No results for input(s): LABPROT, INR in the last 72 hours.  Imaging: Mr 3d Recon At Scanner  Result Date: 01/27/2016 CLINICAL DATA:  Evaluate pancreatic mass. History of perforated duodenal ulcer and pancreatic mass seen and surgery. EXAM: MRI ABDOMEN WITHOUT AND WITH CONTRAST (INCLUDING MRCP) TECHNIQUE: Multiplanar multisequence MR imaging of the abdomen was performed both before and after the administration of intravenous contrast. Heavily T2-weighted images of the biliary and pancreatic ducts were obtained, and three-dimensional MRCP images were rendered by post processing. CONTRAST:  17mL MULTIHANCE GADOBENATE DIMEGLUMINE 529 MG/ML IV SOLN COMPARISON:  CT scan 01/24/2016 FINDINGS: Lower chest: Small bilateral pleural effusions and overlying atelectasis.  No pericardial effusion. Hepatobiliary: No focal hepatic lesions or intrahepatic biliary dilatation. Normal caliber and course of the common bile duct. No common bile duct stones, mass or obstruction. The gallbladder is surgically absent. Pancreas: Very limited examination due to motion artifact. Indistinct soft tissue fullness noted along the posterior aspect of the lower pancreatic head adjacent to the duodenum but very difficult to identified and measure a discrete mass. Normal caliber and course of the main pancreatic duct. The pancreatic body and tail show mild atrophy but no mass. Spleen:  Normal size.  No focal lesions. Adrenals/Urinary Tract:  No significant findings. Stomach/Bowel: The stomach is moderately distended. There is persistent inflammatory type changes involving the second portion duodenum along with a moderate-sized duodenum diverticulum. No new adrenal obstruction. Vascular/Lymphatic: The major vascular structures are patent. The aorta and branch vessels are unremarkable. There are small gastrohepatic ligament and periportal lymph nodes. Other:  Small amount of free abdominal fluid. Musculoskeletal: No significant findings. IMPRESSION: 1. Limited examination due to patient motion. 2. Ill-defined soft tissue fullness between the pancreatic head and duodenum likely reflecting the mass abuts identified at surgery. Dedicated pancreatic CT protocol may be helpful for further evaluation and surgical planning. 3. Small gastrohepatic ligament periportal and celiac axis lymph nodes are indeterminate. 4. Distended stomach but no obvious doing renal obstruction. Moderate-sized duodenum diverticulum. 5. No definite MR findings for metastatic disease. 6. Bilateral pleural effusions and mild to moderate free abdominal fluid. Electronically Signed   By: Marijo Sanes M.D.   On: 01/27/2016 11:55   Mr Abdomen Mrcp Moise Boring Contast  Result Date: 01/27/2016 CLINICAL DATA:  Evaluate pancreatic mass.  History of  perforated duodenal ulcer and pancreatic mass seen and surgery. EXAM: MRI ABDOMEN WITHOUT AND WITH CONTRAST (INCLUDING MRCP) TECHNIQUE: Multiplanar multisequence MR imaging of the abdomen was performed both before and after the administration of intravenous contrast. Heavily T2-weighted images of the biliary and pancreatic ducts were obtained, and three-dimensional MRCP images were rendered by post processing. CONTRAST:  43mL MULTIHANCE GADOBENATE DIMEGLUMINE 529 MG/ML IV SOLN COMPARISON:  CT scan 01/24/2016 FINDINGS: Lower chest: Small bilateral pleural effusions and overlying atelectasis. No pericardial effusion. Hepatobiliary: No focal hepatic lesions or intrahepatic biliary dilatation. Normal caliber and course of the common bile duct. No common bile duct stones, mass or obstruction. The gallbladder is surgically absent. Pancreas: Very limited examination due to motion artifact. Indistinct soft tissue fullness noted along the posterior aspect of the lower pancreatic head adjacent to the duodenum but very difficult to identified and measure a discrete mass. Normal caliber and course of the main pancreatic duct. The pancreatic body and tail show mild atrophy but no mass. Spleen:  Normal size.  No focal lesions. Adrenals/Urinary Tract:  No significant findings. Stomach/Bowel: The stomach is moderately distended. There is persistent inflammatory type changes involving the second portion duodenum along with a moderate-sized duodenum diverticulum. No new adrenal obstruction. Vascular/Lymphatic: The major vascular structures are patent. The aorta and branch vessels are unremarkable. There are small gastrohepatic ligament and periportal lymph nodes. Other:  Small amount of free abdominal fluid. Musculoskeletal: No significant findings. IMPRESSION: 1. Limited examination due to patient motion. 2. Ill-defined soft tissue fullness between the pancreatic head and duodenum likely reflecting the mass abuts identified at  surgery. Dedicated pancreatic CT protocol may be helpful for further evaluation and surgical planning. 3. Small gastrohepatic ligament periportal and celiac axis lymph nodes are indeterminate. 4. Distended stomach but no obvious doing renal obstruction. Moderate-sized duodenum diverticulum. 5. No definite MR findings for metastatic disease. 6. Bilateral pleural effusions and mild to moderate free abdominal fluid. Electronically Signed   By: Marijo Sanes M.D.   On: 01/27/2016 11:55    Assessment/Plan: 75 year old female with a pancreatic head mass noted on exploratory laparotomy  -Pathology results from intraoperative biopsy are still pending. -Continue clear liquid diet and IV fluid hydration. May need to back down to sips the patient continues to be more nauseous. She could have a postoperative ileus -After discussing with gastroenterology, currently we will await biopsy results before any further testing is needed.   Melvyn Neth, [place]

## 2016-01-28 NOTE — Progress Notes (Signed)
Physical Therapy Treatment Patient Details Name: Elizabeth Bailey MRN: [id number] DOB: 1940-08-26 Today's Date: 01/28/2016    History of Present Illness Pt is a 75 y.o. female presenting to hospital with epigastric pain x3 weeks.  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  Pt also with hyponatremia and significant abdominal surgical site pain during hospitalization.  PMH includes arthritis and chronic bronchitis.    PT Comments    Pt agreeable to PT; reports 6/10 pain in abdomen. Pt requires Mod A for up to edge of bed demonstrating good effort. Pt progressing ambulation and sit to/from stand transfers, which are performed several times due to need for increased seated rest times during ambulation. Pt requires Min A from bed and slightly more from visitor chair due to lack of arm rests. Pt experiences mild to moderate dizziness/lightheadedness during ambulation; however, does not offer these symptoms until questioned, as pt behaviors suggested. O2 saturation was low (86%) at first rest. Remained in low 90's throughout session thereafter. Encouraged pt to notify staff if not feeling well for optimal safety and treatment. Pt is able to ambulate several small bouts, the greatest being 25 ft. Pt received up in chair comfortably. Pt given incentive spirometer to encouraged deeper breathing. Continue PT to progress strength, endurance and tolerance to upright activity to progress functional mobility safely.   Follow Up Recommendations  SNF     Equipment Recommendations       Recommendations for Other Services       Precautions / Restrictions Precautions Precautions: Fall Restrictions Weight Bearing Restrictions: No    Mobility  Bed Mobility Overal bed mobility: Needs Assistance Bed Mobility: Supine to Sit     Supine to sit: Mod assist     General bed mobility comments: able to  bridge hips to edge with instruction and advance LEs over the edge. Mod A to elevate trunk. Gives good effort  Transfers Overall transfer level: Needs assistance Equipment used: Rolling walker (2 wheeled) Transfers: Sit to/from Stand Sit to Stand: Min assist         General transfer comment: Performed 1x from bed and 2x from visitor chair after seated rest. Pt demonstrates good strength through legs with only Min A required from bed and slightly more from visitor chair   Ambulation/Gait Ambulation/Gait assistance: Min guard;+2 safety/equipment (follow with chair) Ambulation Distance (Feet): 25 Feet (15 ft second walk, 2 ft 3rd to transfer chairs) Assistive device: Rolling walker (2 wheeled) Gait Pattern/deviations: Step-through pattern;Decreased stride length;Narrow base of support Gait velocity: slow Gait velocity interpretation: <1.8 ft/sec, indicative of risk for recurrent falls General Gait Details: slow 3 point pattern for ambulation. Encouraged upright posture pre gait, which pt able to do and maintain fairly well throughout gait. Pt does become mildly lightheaded/dizzy during ambulation requiring 2 seated rests for increased time. Pt does not notify therapist, but evident in behavior. Pt encouraged to inform staff of adverse sx's.    Stairs            Wheelchair Mobility    Modified Rankin (Stroke Patients Only)       Balance Overall balance assessment: Needs assistance Sitting-balance support: Feet supported;No upper extremity supported Sitting balance-Leahy Scale: Fair     Standing balance support: Bilateral upper extremity supported Standing balance-Leahy Scale: Fair  Cognition Arousal/Alertness: Awake/alert Behavior During Therapy: WFL for tasks assessed/performed Overall Cognitive Status: Within Functional Limits for tasks assessed                      Exercises      General Comments        Pertinent  Vitals/Pain Pain Assessment: 0-10 Pain Score: 6  Pain Location: abdomen Pain Descriptors / Indicators: Constant Pain Intervention(s): Limited activity within patient's tolerance;Monitored during session;Premedicated before session;Repositioned;Other (comment) (abdominal pillow)    Home Living                      Prior Function            PT Goals (current goals can now be found in the care plan section) Progress towards PT goals: Progressing toward goals    Frequency    Min 2X/week      PT Plan Current plan remains appropriate    Co-evaluation             End of Session Equipment Utilized During Treatment: Other (comment) (pt given and instructed in use of incentive spirometer) Activity Tolerance: Other (comment);Patient limited by pain (weakness, dizziness) Patient left: in chair;with call bell/phone within reach;with chair alarm set;with family/visitor present     Time: [id number] PT Time Calculation (min) (ACUTE ONLY): 43 min  Charges:  $Gait Training: 23-37 mins $Therapeutic Activity: 8-22 mins                    G CodesLarae Grooms, PTA 01/28/2016, 4:38 PM

## 2016-01-28 NOTE — Progress Notes (Signed)
[facility] [place] NAME: Elizabeth Bailey    MR#:  [id number]  DATE OF BIRTH:  1941-01-13  SUBJECTIVE:   Pain has much improved since yesterday. She has some nausea, but is tolerating her clear liquid diet   REVIEW OF SYSTEMS:    Review of Systems  Constitutional: Negative.  Negative for chills, fever and malaise/fatigue.  HENT: Negative.  Negative for ear discharge, ear pain, hearing loss, nosebleeds and sore throat.   Eyes: Negative.  Negative for blurred vision and pain.  Respiratory: Negative.  Negative for cough, hemoptysis, shortness of breath and wheezing.   Cardiovascular: Negative.  Negative for chest pain, palpitations and leg swelling.  Gastrointestinal: Positive for abdominal pain (better). Negative for blood in stool, diarrhea, nausea and vomiting.  Genitourinary: Negative.  Negative for dysuria.  Musculoskeletal: Negative for back pain.  Skin: Negative.   Neurological: Negative for dizziness, tremors, speech change, focal weakness, seizures and headaches.  Endo/Heme/Allergies: Negative.  Does not bruise/bleed easily.  Psychiatric/Behavioral: Negative.  Negative for depression, hallucinations and suicidal ideas.    Tolerating Diet: Clear liquid diet      DRUG ALLERGIES:   Allergies  Allergen Reactions  . Cyclobenzaprine     Other reaction(s): Other (See Comments) Frequent urination  . Flucytosine Other (See Comments)    Sore mouth  . Fluticasone-Salmeterol     Other reaction(s): Other (See Comments) Sore mouth  . Naproxen Nausea Only    GI upset  . Other Other (See Comments)    Other reaction(s): Other (See Comments) Frequent urination Other reaction(s): Other (See Comments) Frequent urination Frequent urination    VITALS:  Blood pressure (!) 132/97, pulse 78, temperature 97.9 F (36.6 C), temperature source Oral, resp. rate 17, height 5\' 2"  (1.575 m), weight 66.2 kg (146 lb), SpO2 92 %.  PHYSICAL  EXAMINATION:   Physical Exam  Constitutional: She is oriented to person, place, and time and well-developed, well-nourished, and in no distress. No distress.  HENT:  Head: Normocephalic.  NG tube  Eyes: No scleral icterus.  Neck: Normal range of motion. Neck supple. No JVD present. No tracheal deviation present.  Cardiovascular: Normal rate and regular rhythm.  Exam reveals no gallop and no friction rub.   Murmur heard. Pulmonary/Chest: Effort normal and breath sounds normal. No respiratory distress. She has no wheezes. She has no rales. She exhibits no tenderness.  Abdominal: Soft. She exhibits no distension and no mass. There is no tenderness. There is no rebound and no guarding.  JP drain Mild tenderness but no rebound or guarding   Musculoskeletal: Normal range of motion. She exhibits no edema.  Neurological: She is alert and oriented to person, place, and time.  Skin: Skin is warm. No rash noted. No erythema.  Psychiatric: Affect and judgment normal.      LABORATORY PANEL:   CBC  Recent Labs Lab 01/28/16 0510  WBC 13.5*  HGB 10.3*  HCT 29.8*  PLT 423   ------------------------------------------------------------------------------------------------------------------  Chemistries   Recent Labs Lab 01/28/16 0510  NA 127*  K 3.3*  CL 95*  CO2 24  GLUCOSE 114*  BUN <5*  CREATININE 0.67  CALCIUM 8.1*  MG 1.6*  AST 21  ALT 9*  ALKPHOS 58  BILITOT 0.5   ------------------------------------------------------------------------------------------------------------------  Cardiac Enzymes  Recent Labs Lab 01/24/16 1109  TROPONINI <0.03   ------------------------------------------------------------------------------------------------------------------  RADIOLOGY:  Mr 3d Recon At Scanner  Result Date: 01/27/2016 CLINICAL DATA:  Evaluate pancreatic mass. History of  perforated duodenal ulcer and pancreatic mass seen and surgery. EXAM: MRI ABDOMEN WITHOUT AND  WITH CONTRAST (INCLUDING MRCP) TECHNIQUE: Multiplanar multisequence MR imaging of the abdomen was performed both before and after the administration of intravenous contrast. Heavily T2-weighted images of the biliary and pancreatic ducts were obtained, and three-dimensional MRCP images were rendered by post processing. CONTRAST:  8mL MULTIHANCE GADOBENATE DIMEGLUMINE 529 MG/ML IV SOLN COMPARISON:  CT scan 01/24/2016 FINDINGS: Lower chest: Small bilateral pleural effusions and overlying atelectasis. No pericardial effusion. Hepatobiliary: No focal hepatic lesions or intrahepatic biliary dilatation. Normal caliber and course of the common bile duct. No common bile duct stones, mass or obstruction. The gallbladder is surgically absent. Pancreas: Very limited examination due to motion artifact. Indistinct soft tissue fullness noted along the posterior aspect of the lower pancreatic head adjacent to the duodenum but very difficult to identified and measure a discrete mass. Normal caliber and course of the main pancreatic duct. The pancreatic body and tail show mild atrophy but no mass. Spleen:  Normal size.  No focal lesions. Adrenals/Urinary Tract:  No significant findings. Stomach/Bowel: The stomach is moderately distended. There is persistent inflammatory type changes involving the second portion duodenum along with a moderate-sized duodenum diverticulum. No new adrenal obstruction. Vascular/Lymphatic: The major vascular structures are patent. The aorta and branch vessels are unremarkable. There are small gastrohepatic ligament and periportal lymph nodes. Other:  Small amount of free abdominal fluid. Musculoskeletal: No significant findings. IMPRESSION: 1. Limited examination due to patient motion. 2. Ill-defined soft tissue fullness between the pancreatic head and duodenum likely reflecting the mass abuts identified at surgery. Dedicated pancreatic CT protocol may be helpful for further evaluation and surgical  planning. 3. Small gastrohepatic ligament periportal and celiac axis lymph nodes are indeterminate. 4. Distended stomach but no obvious doing renal obstruction. Moderate-sized duodenum diverticulum. 5. No definite MR findings for metastatic disease. 6. Bilateral pleural effusions and mild to moderate free abdominal fluid. Electronically Signed   By: Marijo Sanes M.D.   On: 01/27/2016 11:55   Mr Abdomen Mrcp Moise Boring Contast  Result Date: 01/27/2016 CLINICAL DATA:  Evaluate pancreatic mass. History of perforated duodenal ulcer and pancreatic mass seen and surgery. EXAM: MRI ABDOMEN WITHOUT AND WITH CONTRAST (INCLUDING MRCP) TECHNIQUE: Multiplanar multisequence MR imaging of the abdomen was performed both before and after the administration of intravenous contrast. Heavily T2-weighted images of the biliary and pancreatic ducts were obtained, and three-dimensional MRCP images were rendered by post processing. CONTRAST:  29mL MULTIHANCE GADOBENATE DIMEGLUMINE 529 MG/ML IV SOLN COMPARISON:  CT scan 01/24/2016 FINDINGS: Lower chest: Small bilateral pleural effusions and overlying atelectasis. No pericardial effusion. Hepatobiliary: No focal hepatic lesions or intrahepatic biliary dilatation. Normal caliber and course of the common bile duct. No common bile duct stones, mass or obstruction. The gallbladder is surgically absent. Pancreas: Very limited examination due to motion artifact. Indistinct soft tissue fullness noted along the posterior aspect of the lower pancreatic head adjacent to the duodenum but very difficult to identified and measure a discrete mass. Normal caliber and course of the main pancreatic duct. The pancreatic body and tail show mild atrophy but no mass. Spleen:  Normal size.  No focal lesions. Adrenals/Urinary Tract:  No significant findings. Stomach/Bowel: The stomach is moderately distended. There is persistent inflammatory type changes involving the second portion duodenum along with a  moderate-sized duodenum diverticulum. No new adrenal obstruction. Vascular/Lymphatic: The major vascular structures are patent. The aorta and branch vessels are unremarkable. There are small gastrohepatic ligament  and periportal lymph nodes. Other:  Small amount of free abdominal fluid. Musculoskeletal: No significant findings. IMPRESSION: 1. Limited examination due to patient motion. 2. Ill-defined soft tissue fullness between the pancreatic head and duodenum likely reflecting the mass abuts identified at surgery. Dedicated pancreatic CT protocol may be helpful for further evaluation and surgical planning. 3. Small gastrohepatic ligament periportal and celiac axis lymph nodes are indeterminate. 4. Distended stomach but no obvious doing renal obstruction. Moderate-sized duodenum diverticulum. 5. No definite MR findings for metastatic disease. 6. Bilateral pleural effusions and mild to moderate free abdominal fluid. Electronically Signed   By: Marijo Sanes M.D.   On: 01/27/2016 11:55     ASSESSMENT AND PLAN:   75 year old female postoperative day # 4 exploratory laparotomy with biopsy of pancreas with hyponatremia.  1. Hypovolemic hyponatremia: Sodium low but stable. Continue current D5 normal saline BMP for a.m.  2. Electrolytes; Pharmacy consult for repletion.  3. Acute kidney injury in the setting of hypovolemia and surgery: Improved and responded to IV fluids  4. Abdominal pain with findings on CT scan suggestive of perforation medial to the duodenum and pancreatic head mass: Management as per surgery Awaiting biopsy results MRI shows possible mass between pancreatic head and duodenum.   Management plans discussed with the patient and she is in agreement.  CODE STATUS: full  Discussed with surgery and nursing Discussed with patient's son TOTAL TIME TAKING CARE OF THIS PATIENT: 25 minutes.    POSSIBLE D/C ??, DEPENDING ON CLINICAL CONDITION.   Sharina Petre M.D on 01/28/2016 at 11:11  AM  Between 7am to 6pm - Pager - [phone] After 6pm go to [email or web address] - password [email or web address] [facility]  Office  [phone]  CC: Primary care physician; Dicky Doe, MD  Note: This dictation was prepared with Dragon dictation along with smaller phrase technology. Any transcriptional errors that result from this process are unintentional.

## 2016-01-28 NOTE — Progress Notes (Addendum)
MEDICATION RELATED CONSULT NOTE - INITIAL   Pharmacy Consult for Electrolytes Indication: hypomagnesemia, hypophosphatemia  Allergies  Allergen Reactions  . Cyclobenzaprine     Other reaction(s): Other (See Comments) Frequent urination  . Flucytosine Other (See Comments)    Sore mouth  . Fluticasone-Salmeterol     Other reaction(s): Other (See Comments) Sore mouth  . Naproxen Nausea Only    GI upset  . Other Other (See Comments)    Other reaction(s): Other (See Comments) Frequent urination Other reaction(s): Other (See Comments) Frequent urination Frequent urination   Patient Measurements: Height: 5\' 2"  (157.5 cm) Weight: 146 lb (66.2 kg) IBW/kg (Calculated) : 50.1  Vital Signs: Temp: 97.9 F (36.6 C) (11/28 0452) Temp Source: Oral (11/28 0452) BP: 139/74 (11/28 0452) Pulse Rate: 71 (11/28 0452) Intake/Output from previous day: 11/27 0701 - 11/28 0700 In: 2122 [P.O.:180; I.V.:1480; IV Piggyback:462] Out: [id number] [Urine:1500; Drains:15] Intake/Output from this shift: Total I/O In: 288 [I.V.:288] Out: 0   Labs:  Recent Labs  01/26/16 0722 01/26/16 2048 01/27/16 0531 01/28/16 0510  WBC 18.7*  --   --  13.5*  HGB 10.7*  --   --  10.3*  HCT 31.4*  --   --  29.8*  PLT 430  --   --  423  CREATININE 0.74  --  0.73 0.67  MG 1.6*  --  1.8  --   PHOS 1.3* 2.3* 2.5  --   ALBUMIN  --   --   --  2.4*  PROT  --   --   --  6.3*  AST  --   --   --  21  ALT  --   --   --  9*  ALKPHOS  --   --   --  58  BILITOT  --   --   --  0.5   Estimated Creatinine Clearance: 55 mL/min (by C-G formula based on SCr of 0.67 mg/dL).  Assessment: Pharmacy consulted to monitor and replace electrolytes as needed in this 75 year old female admitted with a pancreatic mass who is S/P exploratory laparotomy and biopsy.  Goal of Therapy: Electrolytes WNL  Plan:  11/27  K:3.3    Mg: 1.6    Phos: 2.3 MD replacing with KCL 40 mEq PO and  D5W /NS w/KCL 3mEq/L at 41mL/hr.  Will add  Magnesium 2gm IV x1 and K Phos neutral tablets 2 tabes every 4 hours x2. Will recheck electrolytes (K+, Mg, and Phos) with am labs.    Nancy Fetter, PharmD, BCPS  Clinical Pharmacist 01/28/2016 7:43 AM

## 2016-01-28 NOTE — Progress Notes (Signed)
Per Dr. Hampton Abbot place order for incentive spirometer

## 2016-01-29 LAB — BASIC METABOLIC PANEL
Anion gap: 6 (ref 5–15)
CO2: 26 mmol/L (ref 22–32)
CREATININE: 0.63 mg/dL (ref 0.44–1.00)
Calcium: 8 mg/dL — ABNORMAL LOW (ref 8.9–10.3)
Chloride: 98 mmol/L — ABNORMAL LOW (ref 101–111)
GFR calc Af Amer: >60 mL/min (ref >60)
Glucose, Bld: 108 mg/dL — ABNORMAL HIGH (ref 65–99)
POTASSIUM: 3.6 mmol/L (ref 3.5–5.1)
SODIUM: 130 mmol/L — AB (ref 135–145)

## 2016-01-29 LAB — CBC
HEMATOCRIT: 29.6 % — AB (ref 35.0–47.0)
Hemoglobin: 10.3 g/dL — ABNORMAL LOW (ref 12.0–16.0)
MCH: 32.9 pg (ref 26.0–34.0)
MCHC: 34.8 g/dL (ref 32.0–36.0)
MCV: 94.6 fL (ref 80.0–100.0)
PLATELETS: 477 10*3/uL — AB (ref 150–440)
RBC: 3.13 MIL/uL — ABNORMAL LOW (ref 3.80–5.20)
RDW: 12.6 % (ref 11.5–14.5)
WBC: 9.8 10*3/uL (ref 3.6–11.0)

## 2016-01-29 LAB — PHOSPHORUS: PHOSPHORUS: 2.3 mg/dL — AB (ref 2.5–4.6)

## 2016-01-29 LAB — MAGNESIUM: Magnesium: 1.7 mg/dL (ref 1.7–2.4)

## 2016-01-29 LAB — SURGICAL PATHOLOGY

## 2016-01-29 MED ORDER — AMOXICILLIN-POT CLAVULANATE 875-125 MG PO TABS
1.0000 | ORAL_TABLET | Freq: Two times a day (BID) | ORAL | Status: DC
  Administered 2016-01-29 – 2016-01-31 (×4): 1 via ORAL
  Filled 2016-01-29 (×5): qty 1

## 2016-01-29 MED ORDER — K PHOS MONO-SOD PHOS DI & MONO 155-852-130 MG PO TABS: 250.0000 mg | ORAL_TABLET | Freq: Three times a day (TID) | ORAL | Status: DC

## 2016-01-29 MED ORDER — AMOXICILLIN 500 MG PO CAPS
1000.0000 mg | ORAL_CAPSULE | Freq: Two times a day (BID) | ORAL | Status: DC
  Administered 2016-01-29: 1000 mg via ORAL
  Filled 2016-01-29: qty 2

## 2016-01-29 MED ORDER — BISACODYL 10 MG RE SUPP
10.0000 mg | Freq: Every day | RECTAL | Status: DC | PRN
  Administered 2016-01-29: 10 mg via RECTAL
  Filled 2016-01-29: qty 1

## 2016-01-29 MED ORDER — PANTOPRAZOLE SODIUM 40 MG PO TBEC
40.0000 mg | DELAYED_RELEASE_TABLET | Freq: Every day | ORAL | Status: DC
  Administered 2016-01-30 – 2016-01-31 (×2): 40 mg via ORAL
  Filled 2016-01-29 (×3): qty 1

## 2016-01-29 MED ORDER — HYDROMORPHONE HCL 1 MG/ML IJ SOLN
0.5000 mg | INTRAMUSCULAR | Status: DC | PRN
  Administered 2016-01-29 – 2016-01-30 (×2): 0.5 mg via INTRAVENOUS
  Filled 2016-01-29 (×2): qty 1

## 2016-01-29 MED ORDER — PANTOPRAZOLE SODIUM 40 MG PO TBEC: 40.0000 mg | DELAYED_RELEASE_TABLET | Freq: Two times a day (BID) | ORAL | Status: DC

## 2016-01-29 MED ORDER — MAGNESIUM SULFATE 2 GM/50ML IV SOLN
2.0000 g | Freq: Once | INTRAVENOUS | Status: AC
  Administered 2016-01-29: 2 g via INTRAVENOUS
  Filled 2016-01-29: qty 50

## 2016-01-29 MED ORDER — K PHOS MONO-SOD PHOS DI & MONO 155-852-130 MG PO TABS
500.0000 mg | ORAL_TABLET | Freq: Three times a day (TID) | ORAL | Status: AC
  Administered 2016-01-29 (×3): 500 mg via ORAL
  Filled 2016-01-29 (×3): qty 2

## 2016-01-29 MED ORDER — AMOXICILLIN-POT CLAVULANATE 875-125 MG PO TABS: 1.0000 | ORAL_TABLET | Freq: Two times a day (BID) | ORAL | Status: DC

## 2016-01-29 MED ORDER — CLARITHROMYCIN 500 MG PO TABS
500.0000 mg | ORAL_TABLET | Freq: Two times a day (BID) | ORAL | Status: DC
Start: 2016-01-29 — End: 2016-01-29
  Administered 2016-01-29: 500 mg via ORAL
  Filled 2016-01-29 (×3): qty 1

## 2016-01-29 MED ORDER — POTASSIUM CHLORIDE CRYS ER 20 MEQ PO TBCR
40.0000 meq | EXTENDED_RELEASE_TABLET | Freq: Once | ORAL | Status: AC
  Administered 2016-01-29: 40 meq via ORAL
  Filled 2016-01-29 (×2): qty 2

## 2016-01-29 MED ORDER — POLYETHYLENE GLYCOL 3350 17 G PO PACK
17.0000 g | PACK | Freq: Every day | ORAL | Status: DC
  Administered 2016-01-29 – 2016-01-31 (×3): 17 g via ORAL
  Filled 2016-01-29 (×3): qty 1

## 2016-01-29 NOTE — Progress Notes (Signed)
Per Dr. Hampton Abbot giver PRN suppository

## 2016-01-29 NOTE — NC FL2 (Signed)
[facility] LEVEL OF CARE SCREENING TOOL     IDENTIFICATION  Patient Name: Elizabeth Bailey Birthdate: 09-Dec-1940 Sex: female Admission Date (Current Location): 01/24/2016  [place] and [place] Number:  Engineering geologist and Address:  [facility], [address] [place], [place] [address]      Provider Number: [id number]  Attending Physician Name and Address:  Florene Glen, MD  Relative Name and Phone Number:       Current Level of Care: Hospital Recommended Level of Care: [place] Prior Approval Number:    Date Approved/Denied:   PASRR Number: [id number]  Discharge Plan: SNF    Current Diagnoses: Patient Active Problem List   Diagnosis Date Noted  . Pancreatic mass 01/24/2016  . Acute gastric ulcer with perforation ([place])   . Nodule of finger 03/28/2015  . Rheumatoid arthritis ([place]) 01/09/2015  . Insomnia 01/09/2015  . Allergic state 11/28/2014  . Airway hyperreactivity 11/28/2014  . Pulmonary emphysema ([place]) 11/28/2014  . BP (high blood pressure) 11/28/2014  . Apnea, sleep 11/28/2014  . Depression 11/28/2014  . Carotid artery stenosis 11/28/2014  . H/O carotid endarterectomy 11/28/2014  . Seasonal allergies 11/28/2014  . Hyperlipidemia 11/28/2014  . Need for influenza vaccination 11/28/2014  . Encounter to establish care 11/28/2014  . Abnormal finding on liver function 11/29/2013  . Arthritis 11/29/2013  . Iritis 11/29/2013  . Neuritis or radiculitis due to rupture of lumbar intervertebral disc 08/22/2013  . Herpes zona 08/08/2013  . Cervical radiculitis 08/07/2013  . DDD (degenerative disc disease), cervical 08/07/2013    Orientation RESPIRATION BLADDER Height & Weight     Self, Time, Situation, Place  Normal Continent Weight: 146 lb (66.2 kg) Height:  5\' 2"  (157.5 cm)  BEHAVIORAL SYMPTOMS/MOOD NEUROLOGICAL BOWEL NUTRITION STATUS   (none)  (none) Continent Diet (clears to be advanced as  tolerated)  AMBULATORY STATUS COMMUNICATION OF NEEDS Skin   Supervision Verbally Surgical wounds, Normal                       Personal Care Assistance Level of Assistance  Feeding, Bathing, Dressing Bathing Assistance: Independent Feeding assistance: Independent Dressing Assistance: Independent     Functional Limitations Info             SPECIAL CARE FACTORS FREQUENCY  PT (By licensed PT)                    Contractures Contractures Info: Not present    Additional Factors Info  Code Status, Allergies Code Status Info: full Allergies Info: cyclobenzaprine, flucytosine, naproxen           Current Medications (01/29/2016):  This is the current hospital active medication list Current Facility-Administered Medications  Medication Dose Route Frequency Provider Last Rate Last Dose  . amLODipine (NORVASC) tablet 5 mg  5 mg Oral Daily Florene Glen, MD   5 mg at 01/29/16 0955  . amoxicillin (AMOXIL) capsule 1,000 mg  1,000 mg Oral Q12H Olean Ree, MD      . clarithromycin (BIAXIN) tablet 500 mg  500 mg Oral Q12H Jose Piscoya, MD      . dextrose 5 % and 0.9 % NaCl with KCl 20 mEq/L infusion   Intravenous Continuous Olean Ree, MD 75 mL/hr at 01/28/16 2225    . diphenhydrAMINE (BENADRYL) injection 25 mg  25 mg Intravenous Q6H PRN Florene Glen, MD   25 mg at 01/26/16 1233  . feeding  supplement (BOOST / RESOURCE BREEZE) liquid 1 Container  1 Container Oral TID BM Olean Ree, MD   1 Container at 01/28/16 2037  . fluticasone (FLONASE) 50 MCG/ACT nasal spray 2 spray  2 spray Each Nare Daily Florene Glen, MD   2 spray at 01/29/16 0957  . heparin injection 5,000 Units  5,000 Units Subcutaneous Q8H Florene Glen, MD   5,000 Units at 01/29/16 0542  . HYDROmorphone (DILAUDID) injection 0.5 mg  0.5 mg Intravenous Q4H PRN Olean Ree, MD      . losartan (COZAAR) tablet 100 mg  100 mg Oral Daily Florene Glen, MD   100 mg at 01/29/16 0952  .  menthol-cetylpyridinium (CEPACOL) lozenge 3 mg  1 lozenge Oral PRN Florene Glen, MD   3 mg at 01/26/16 1234  . mometasone-formoterol (DULERA) 200-5 MCG/ACT inhaler 2 puff  2 puff Inhalation BID Florene Glen, MD   2 puff at 01/29/16 0801  . montelukast (SINGULAIR) tablet 10 mg  10 mg Oral Daily Florene Glen, MD   10 mg at 01/29/16 0955  . ondansetron (ZOFRAN) tablet 4 mg  4 mg Oral Q6H PRN Florene Glen, MD       Or  . ondansetron [facility]) injection 4 mg  4 mg Intravenous Q6H PRN Florene Glen, MD      . oxyCODONE (Oxy IR/ROXICODONE) immediate release tablet 5-10 mg  5-10 mg Oral Q4H PRN Olean Ree, MD   10 mg at 01/29/16 0542  . pantoprazole (PROTONIX) EC tablet 40 mg  40 mg Oral BID Olean Ree, MD      . phosphorus (K PHOS NEUTRAL) tablet 500 mg  500 mg Oral TID Sheema M Hallaji, RPH   500 mg at 01/29/16 1002  . polyvinyl alcohol (LIQUIFILM TEARS) 1.4 % ophthalmic solution 1 drop  1 drop Both Eyes PRN Florene Glen, MD   1 drop at 01/26/16 [id number]     Discharge Medications: Please see discharge summary for a list of discharge medications.  Relevant Imaging Results:  Relevant Lab Results:   Additional Information SS: [id number]  Shela Leff, LCSW

## 2016-01-29 NOTE — Clinical Social Work Note (Signed)
Clinical Social Work Assessment  Patient Details  Name: Elizabeth Bailey MRN: [id number] Date of Birth: 1940/03/11  Date of referral:  01/29/16               Reason for consult:  Facility Placement                Permission sought to share information with:  Facility Sport and exercise psychologist, Family Supports Permission granted to share information::  Yes, Verbal Permission Granted  Name::        Agency::     Relationship::     Contact Information:     Housing/Transportation Living arrangements for the past 2 months:  [place] of Information:  Patient, Adult Children Patient Interpreter Needed:  None Criminal Activity/Legal Involvement Pertinent to Current Situation/Hospitalization:  No - Comment as needed Significant Relationships:  Adult Children Lives with:  Self Do you feel safe going back to the place where you live?  Yes Need for family participation in patient care:  No (Coment)  Care giving concerns:  Patient is baseline independent in all ADL's.   Social Worker assessment / plan:  PT has assessed patient and recommending STR. CSW spoke with patient and her son this morning and explained role and purpose of visit. Patient was in a good mood as she had just been informed that they did not think her mass was cancer. Patient and son discussed that they will see how patient does over the next couple of days and make a decision about STR. Patient's son lives in [place] and patient lives alone. Patient is willing to do whatever ever would be the easiest for her son. CSW reiterated that she needed to do what was best for her and what made her feel the most safe and secure and what would lead to a faster recovery. Patient gave CSW permission to begin bedsearch and is going to think about STR but states that if she can, she will try to go home.  Employment status:  Retired Nurse, adult PT Recommendations:  [place] / Referral to community resources:     Patient/Family's Response to care:  Patient expressed appreciation for CSW assistance.  Patient/Family's Understanding of and Emotional Response to Diagnosis, Current Treatment, and Prognosis:  Patient and son were very much relieved at the good news patient had just been told about the mass she had removed.   Emotional Assessment Appearance:  Appears stated age Attitude/Demeanor/Rapport:   (pleasant and cooperative) Affect (typically observed):  Pleasant, Calm, Stable, Hopeful Orientation:  Oriented to Self, Oriented to Place, Oriented to  Time, Oriented to Situation Alcohol / Substance use:  Not Applicable Psych involvement (Current and /or in the community):  No (Comment)  Discharge Needs  Concerns to be addressed:  Care Coordination Readmission within the last 30 days:  No Current discharge risk:  None Barriers to Discharge:  No Barriers Identified   Shela Leff, LCSW 01/29/2016, 11:40 AM

## 2016-01-29 NOTE — Progress Notes (Addendum)
[facility] NAME: Sahari Ramcharan    MR#:  [id number]  DATE OF BIRTH:  [birth year]  SUBJECTIVE:   Lead mild abdominal pain, no nausea or vomiting. Tolerated clear liquid diet.   REVIEW OF SYSTEMS:    Review of Systems  Constitutional: Negative.  Negative for chills, fever and malaise/fatigue.  HENT: Negative.  Negative for ear discharge, ear pain, hearing loss, nosebleeds and sore throat.   Eyes: Negative.  Negative for blurred vision and pain.  Respiratory: Negative.  Negative for cough, hemoptysis, shortness of breath and wheezing.   Cardiovascular: Negative.  Negative for chest pain, palpitations and leg swelling.  Gastrointestinal: Positive for abdominal pain (better). Negative for blood in stool, diarrhea, nausea and vomiting.  Genitourinary: Negative.  Negative for dysuria.  Musculoskeletal: Negative for back pain.  Skin: Negative.   Neurological: Negative for dizziness, tremors, speech change, focal weakness, seizures and headaches.  Endo/Heme/Allergies: Negative.  Does not bruise/bleed easily.  Psychiatric/Behavioral: Negative.  Negative for depression, hallucinations and suicidal ideas.    Tolerating Diet: Clear liquid diet  DRUG ALLERGIES:   Allergies  Allergen Reactions  . Cyclobenzaprine     Other reaction(s): Other (See Comments) Frequent urination  . Flucytosine Other (See Comments)    Sore mouth  . Fluticasone-Salmeterol     Other reaction(s): Other (See Comments) Sore mouth  . Naproxen Nausea Only    GI upset  . Other Other (See Comments)    Other reaction(s): Other (See Comments) Frequent urination Other reaction(s): Other (See Comments) Frequent urination Frequent urination    VITALS:  Blood pressure 125/69, pulse 79, temperature 97.9 F (36.6 C), temperature source Oral, resp. rate 19, height 5\' 2"  (1.575 m), weight 146 lb (66.2 kg), SpO2 96 %.  PHYSICAL EXAMINATION:   Physical Exam   Constitutional: She is oriented to person, place, and time and well-developed, well-nourished, and in no distress. No distress.  HENT:  Head: Normocephalic.  Eyes: No scleral icterus.  Neck: Normal range of motion. Neck supple. No JVD present. No tracheal deviation present.  Cardiovascular: Normal rate and regular rhythm.  Exam reveals no gallop and no friction rub.   Murmur heard. Pulmonary/Chest: Effort normal and breath sounds normal. No respiratory distress. She has no wheezes. She has no rales. She exhibits no tenderness.  Abdominal: Soft. She exhibits no distension and no mass. There is no tenderness. There is no rebound and no guarding.  JP drain Mild tenderness but no rebound or guarding   Musculoskeletal: Normal range of motion. She exhibits no edema.  Neurological: She is alert and oriented to person, place, and time.  Skin: Skin is warm. No rash noted. No erythema.  Psychiatric: Affect and judgment normal.      LABORATORY PANEL:   CBC  Recent Labs Lab 01/29/16 0515  WBC 9.8  HGB 10.3*  HCT 29.6*  PLT 477*   ------------------------------------------------------------------------------------------------------------------  Chemistries   Recent Labs Lab 01/28/16 0510 01/29/16 0515  NA 127* 130*  K 3.3* 3.6  CL 95* 98*  CO2 24 26  GLUCOSE 114* 108*  BUN <5* <5*  CREATININE 0.67 0.63  CALCIUM 8.1* 8.0*  MG 1.6* 1.7  AST 21  --   ALT 9*  --   ALKPHOS 58  --   BILITOT 0.5  --    ------------------------------------------------------------------------------------------------------------------  Cardiac Enzymes  Recent Labs Lab 01/24/16 1109  TROPONINI <0.03   ------------------------------------------------------------------------------------------------------------------  RADIOLOGY:  No results found.   ASSESSMENT AND  PLAN:   75 year old female postoperative day # 4 exploratory laparotomy with biopsy of pancreas with hyponatremia.  1.  Hypovolemic hyponatremia: improving. Continue current D5 normal saline, f/u BMP.  2. Hypokalemia, improved with supplement. Hypomagnesemia. Improved with supplement.  3. Acute kidney injury in the setting of hypovolemia and surgery: Improved.  4. Abdominal pain with findings on CT scan suggestive of perforation medial to the duodenum and pancreatic head mass: Management as per surgery.  MRI shows possible mass between pancreatic head and duodenum. Preliminarily there is no evidence of malignancy.  PT evaluation suggested skilled nursing facility placement. Management plans discussed with the patient, his son and they are in agreement.  CODE STATUS: full code.  Discussed with nursing.  TOTAL TIME TAKING CARE OF THIS PATIENT: 25 minutes.    POSSIBLE D/C ??, DEPENDING ON CLINICAL CONDITION.   Demetrios Loll M.D on 01/29/2016 at 4:28 PM  Between 7am to 6pm - Pager - [phone] After 6pm go to [email or web address] - password [email or web address] [facility]  Office  [phone]  CC: Primary care physician; Dicky Doe, MD  Note: This dictation was prepared with Dragon dictation along with smaller phrase technology. Any transcriptional errors that result from this process are unintentional.

## 2016-01-29 NOTE — Care Management Important Message (Signed)
Important Message  Patient Details  Name: Elizabeth Bailey MRN: [id number] Date of Birth: 01-02-1941   Medicare Important Message Given:  Yes    Beverly Sessions, RN 01/29/2016, 3:27 PM

## 2016-01-29 NOTE — Progress Notes (Signed)
Physical Therapy Treatment Patient Details Name: Elizabeth Bailey MRN: [id number] DOB: 1940-10-20 Today's Date: 01/29/2016    History of Present Illness Pt is a 75 y.o. female presenting to hospital with epigastric pain x3 weeks.  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  Pt also with hyponatremia and significant abdominal surgical site pain during hospitalization.  PMH includes arthritis and chronic bronchitis.    PT Comments    Pt agreeable to PT. Continues with abdominal pain. Pt continues to require mod A for bed mobility, but demonstrates good effort. Slow to rise/sit with transfers, but good use of Bilateral lower extremities. Pt experiences mild lightheadedness mid way through short walk, but notes not as bad and is tolerable. Pt takes many pauses due to pain throughout short walk. Pt educated in abdominal isolation to be performed several reps at a time throughout the day.  Pt received up in chair comfortably. Continue PT to progress strength, endurance to improve all functional mobility.   Follow Up Recommendations  SNF     Equipment Recommendations       Recommendations for Other Services       Precautions / Restrictions Precautions Precautions: Fall Precaution Comments: abdominal JP drain; long abdominal incision Restrictions Weight Bearing Restrictions: No    Mobility  Bed Mobility Overal bed mobility: Needs Assistance Bed Mobility: Supine to Sit     Supine to sit: Mod assist        Transfers Overall transfer level: Needs assistance Equipment used: Rolling walker (2 wheeled) Transfers: Sit to/from Stand Sit to Stand: Min assist         General transfer comment: Slow to rise/sit; encouraged abdominal isolation with function  Ambulation/Gait Ambulation/Gait assistance: Min guard Ambulation Distance (Feet): 18 Feet Assistive device: Rolling walker (2  wheeled) Gait Pattern/deviations: Step-through pattern;Decreased stride length Gait velocity: slow Gait velocity interpretation: <1.8 ft/sec, indicative of risk for recurrent falls General Gait Details: slow 3 point position with many pauses. Mild lightheadedness midway. Pt tolerates to continue to chair   Stairs            Wheelchair Mobility    Modified Rankin (Stroke Patients Only)       Balance   Sitting-balance support: Feet supported;Bilateral upper extremity supported Sitting balance-Leahy Scale: Fair     Standing balance support: Bilateral upper extremity supported Standing balance-Leahy Scale: Fair                      Cognition Arousal/Alertness: Awake/alert Behavior During Therapy: WFL for tasks assessed/performed Overall Cognitive Status: Within Functional Limits for tasks assessed                      Exercises Other Exercises Other Exercises: abdominal isolation 2 x 5 reps seated with pillow assist    General Comments        Pertinent Vitals/Pain Pain Assessment: 0-10 Pain Score: 6  Pain Location: Abdomen Pain Descriptors / Indicators: Constant Pain Intervention(s): Repositioned;Monitored during session;Limited activity within patient's tolerance    Home Living                      Prior Function            PT Goals (current goals can now be found in the care plan section) Progress towards PT goals: Progressing toward goals    Frequency  Min 2X/week      PT Plan Current plan remains appropriate    Co-evaluation             End of Session   Activity Tolerance: Patient limited by fatigue;Patient limited by pain Patient left: in chair;with call bell/phone within reach;with chair alarm set;with family/visitor present     Time: [id number] PT Time Calculation (min) (ACUTE ONLY): 28 min  Charges:  $Gait Training: 8-22 mins $Therapeutic Activity: 8-22 mins                    G CodesLarae Grooms, PTA 01/29/2016, 3:18 PM

## 2016-01-29 NOTE — Progress Notes (Signed)
01/29/2016  Subjective: Patient is 5 Days Post-Op status post exploratory laparotomy and intraoperative biopsy. Patient denies having any flatus yet has been tolerating clear liquid diet with intermittent nausea. She has been ambulating a little bit more with physical therapy. Her pain is better controlled today as well.  Vital signs: Temp:  [97.5 F (36.4 C)-98.1 F (36.7 C)] 97.5 F (36.4 C) (11/29 0528) Pulse Rate:  [76-85] 78 (11/29 0528) Resp:  [16-18] 18 (11/29 0528) BP: (119-134)/(59-81) 132/65 (11/29 0952) SpO2:  [93 %-95 %] 94 % (11/29 0528)   Intake/Output: 11/28 0701 - 11/29 0700 In: 2499 [P.O.:480; I.V.:1969; IV Piggyback:50] Out: 2335 [Urine:2300; Drains:35] Last BM Date: 01/20/16  Physical Exam: Constitutional:  No acute distress Abdomen: Soft, nondistended, appropriately tender to palpation. Midline incision is clean dry and intact with staples. Right-sided JP drain with small volume of serosanguineous fluid.  Labs:   Recent Labs  01/28/16 0510 01/29/16 0515  WBC 13.5* 9.8  HGB 10.3* 10.3*  HCT 29.8* 29.6*  PLT 423 477*    Recent Labs  01/28/16 0510 01/29/16 0515  NA 127* 130*  K 3.3* 3.6  CL 95* 98*  CO2 24 26  GLUCOSE 114* 108*  BUN <5* <5*  CREATININE 0.67 0.63  CALCIUM 8.1* 8.0*   No results for input(s): LABPROT, INR in the last 72 hours.  Imaging: No results found.  Assessment/Plan: 75 year old female status post expiratory laparotomy and biopsy.  -Continue clear liquid diet until return of bowel function. Continue low-volume IV fluid hydration as well. -Discussed with pathologist this morning regarding the patient's biopsy results. Preliminarily there is no evidence of malignancy. -We'll empirically treat for possible H. pylori if this could've resulted from a perforated ulcer instead. She would benefit from an upper endoscopy in the future.   Melvyn Neth, [place]

## 2016-01-29 NOTE — Progress Notes (Signed)
MEDICATION RELATED CONSULT NOTE - INITIAL   Pharmacy Consult for Electrolytes Indication: hypomagnesemia, hypophosphatemia  Allergies  Allergen Reactions  . Cyclobenzaprine     Other reaction(s): Other (See Comments) Frequent urination  . Flucytosine Other (See Comments)    Sore mouth  . Fluticasone-Salmeterol     Other reaction(s): Other (See Comments) Sore mouth  . Naproxen Nausea Only    GI upset  . Other Other (See Comments)    Other reaction(s): Other (See Comments) Frequent urination Other reaction(s): Other (See Comments) Frequent urination Frequent urination   Patient Measurements: Height: 5\' 2"  (157.5 cm) Weight: 146 lb (66.2 kg) IBW/kg (Calculated) : 50.1  Vital Signs: Temp: 97.5 F (36.4 C) (11/29 0528) Temp Source: Oral (11/29 0528) BP: 119/59 (11/29 0528) Pulse Rate: 78 (11/29 0528) Intake/Output from previous day: 11/28 0701 - 11/29 0700 In: 2499 [P.O.:480; I.V.:1969; IV Piggyback:50] Out: 2335 [Urine:2300; Drains:35] Intake/Output from this shift: Total I/O In: 118 [I.V.:118] Out: 0   Labs:  Recent Labs  01/27/16 0531 01/28/16 0510 01/29/16 0515  WBC  --  13.5* 9.8  HGB  --  10.3* 10.3*  HCT  --  29.8* 29.6*  PLT  --  423 477*  CREATININE 0.73 0.67 0.63  MG 1.8 1.6* 1.7  PHOS 2.5 2.3* 2.3*  ALBUMIN  --  2.4*  --   PROT  --  6.3*  --   AST  --  21  --   ALT  --  9*  --   ALKPHOS  --  58  --   BILITOT  --  0.5  --    Estimated Creatinine Clearance: 55 mL/min (by C-G formula based on SCr of 0.63 mg/dL).  Assessment: Pharmacy consulted to monitor and replace electrolytes as needed in this 75 year old female admitted with a pancreatic mass who is S/P exploratory laparotomy and biopsy.  Goal of Therapy: Electrolytes WNL  Plan:  11/27  K:3.6    Mg: 1.7    Phos: 2.3 MD replacing with KCL 40 mEq PO,  Magnesium 2gm IV x1 and K Phos neutral tablets 1 TID x 3 doses. Will modify KPhos to 2 tabs TID for 3 doses.  Will recheck electrolytes  (K+, Mg, and Phos) with am labs.   Nancy Fetter, PharmD, BCPS  Clinical Pharmacist 01/29/2016 8:50 AM

## 2016-01-30 ENCOUNTER — Ambulatory Visit: Payer: Self-pay | Admitting: General Surgery

## 2016-01-30 LAB — BASIC METABOLIC PANEL
Anion gap: 5 (ref 5–15)
CHLORIDE: 99 mmol/L — AB (ref 101–111)
CO2: 26 mmol/L (ref 22–32)
Calcium: 7.9 mg/dL — ABNORMAL LOW (ref 8.9–10.3)
Creatinine, Ser: 0.68 mg/dL (ref 0.44–1.00)
Glucose, Bld: 98 mg/dL (ref 65–99)
Potassium: 4.4 mmol/L (ref 3.5–5.1)
SODIUM: 130 mmol/L — AB (ref 135–145)

## 2016-01-30 LAB — MAGNESIUM: MAGNESIUM: 1.7 mg/dL (ref 1.7–2.4)

## 2016-01-30 LAB — PHOSPHORUS: PHOSPHORUS: 2.7 mg/dL (ref 2.5–4.6)

## 2016-01-30 MED ORDER — MAGNESIUM SULFATE 2 GM/50ML IV SOLN
2.0000 g | Freq: Once | INTRAVENOUS | Status: AC
  Administered 2016-01-30: 2 g via INTRAVENOUS
  Filled 2016-01-30: qty 50

## 2016-01-30 MED ORDER — SIMVASTATIN 20 MG PO TABS
20.0000 mg | ORAL_TABLET | ORAL | Status: DC
  Administered 2016-01-30: 20 mg via ORAL
  Filled 2016-01-30: qty 1

## 2016-01-30 MED ORDER — FOLIC ACID 1 MG PO TABS
1.0000 mg | ORAL_TABLET | Freq: Every day | ORAL | Status: DC
  Administered 2016-01-31: 1 mg via ORAL
  Filled 2016-01-30 (×2): qty 1

## 2016-01-30 NOTE — Progress Notes (Signed)
No nausea/vomitting overnight; pain meds continued at intervals; JP with minimal output; Barbaraann Faster, RN 7:39 AM 01/30/2016

## 2016-01-30 NOTE — Clinical Social Work Note (Signed)
CSW met with patient and her son this morning to extend bed offers. Patient chose [facility]. Patient's son to transport patient. Physician has stated that the goal is for discharge tomorrow and patient and son are aware. Shela Leff MSW,LCSW [phone]

## 2016-01-30 NOTE — Progress Notes (Signed)
[name] NAME: Elizabeth Bailey    MR#:  [id number]  DATE OF BIRTH:  1940/12/14  SUBJECTIVE:   Has mild abdominal pain, no nausea or vomiting. Advanced to soft diet.   REVIEW OF SYSTEMS:    Review of Systems  Constitutional: Negative.  Negative for chills, fever and malaise/fatigue.  HENT: Negative.  Negative for ear discharge, ear pain, hearing loss, nosebleeds and sore throat.   Eyes: Negative.  Negative for blurred vision and pain.  Respiratory: Negative.  Negative for cough, hemoptysis, shortness of breath and wheezing.   Cardiovascular: Negative.  Negative for chest pain, palpitations and leg swelling.  Gastrointestinal: Positive for abdominal pain (better). Negative for blood in stool, diarrhea, nausea and vomiting.  Genitourinary: Negative.  Negative for dysuria.  Musculoskeletal: Negative for back pain.  Skin: Negative.   Neurological: Negative for dizziness, tremors, speech change, focal weakness, seizures and headaches.  Endo/Heme/Allergies: Negative.  Does not bruise/bleed easily.  Psychiatric/Behavioral: Negative.  Negative for depression, hallucinations and suicidal ideas.    Tolerating Diet: Clear liquid diet  DRUG ALLERGIES:   Allergies  Allergen Reactions  . Cyclobenzaprine     Other reaction(s): Other (See Comments) Frequent urination  . Flucytosine Other (See Comments)    Sore mouth  . Fluticasone-Salmeterol     Other reaction(s): Other (See Comments) Sore mouth  . Naproxen Nausea Only    GI upset  . Other Other (See Comments)    Other reaction(s): Other (See Comments) Frequent urination Other reaction(s): Other (See Comments) Frequent urination Frequent urination    VITALS:  Blood pressure (!) 91/57, pulse 81, temperature 97.8 F (36.6 C), temperature source Oral, resp. rate 18, height 5\' 2"  (1.575 m), weight 146 lb (66.2 kg), SpO2 91 %.  PHYSICAL EXAMINATION:   Physical Exam   Constitutional: She is oriented to person, place, and time and well-developed, well-nourished, and in no distress. No distress.  HENT:  Head: Normocephalic.  Eyes: No scleral icterus.  Neck: Normal range of motion. Neck supple. No JVD present. No tracheal deviation present.  Cardiovascular: Normal rate and regular rhythm.  Exam reveals no gallop and no friction rub.   Murmur heard. Pulmonary/Chest: Effort normal and breath sounds normal. No respiratory distress. She has no wheezes. She has no rales. She exhibits no tenderness.  Abdominal: Soft. She exhibits no distension and no mass. There is no tenderness. There is no rebound and no guarding.  JP drain Mild tenderness but no rebound or guarding   Musculoskeletal: Normal range of motion. She exhibits no edema.  Neurological: She is alert and oriented to person, place, and time.  Skin: Skin is warm. No rash noted. No erythema.  Psychiatric: Affect and judgment normal.      LABORATORY PANEL:   CBC  Recent Labs Lab 01/29/16 0515  WBC 9.8  HGB 10.3*  HCT 29.6*  PLT 477*   ------------------------------------------------------------------------------------------------------------------  Chemistries   Recent Labs Lab 01/28/16 0510  01/30/16 0546  NA 127*  < > 130*  K 3.3*  < > 4.4  CL 95*  < > 99*  CO2 24  < > 26  GLUCOSE 114*  < > 98  BUN <5*  < > <5*  CREATININE 0.67  < > 0.68  CALCIUM 8.1*  < > 7.9*  MG 1.6*  < > 1.7  AST 21  --   --   ALT 9*  --   --   ALKPHOS 58  --   --  BILITOT 0.5  --   --   < > = values in this interval not displayed. ------------------------------------------------------------------------------------------------------------------  Cardiac Enzymes  Recent Labs Lab 01/24/16 1109  TROPONINI <0.03   ------------------------------------------------------------------------------------------------------------------  RADIOLOGY:  No results found.   ASSESSMENT AND PLAN:    75 year old female postoperative day # 4 exploratory laparotomy with biopsy of pancreas with hyponatremia.  1. Hypovolemic hyponatremia: improving with D5 normal saline, f/u BMP.  2. Hypokalemia, improved with supplement. Hypomagnesemia. Improved with supplement.  3. Acute kidney injury in the setting of hypovolemia and surgery: Improved.  4. Abdominal pain with findings on CT scan suggestive of perforation medial to the duodenum and pancreatic head mass: Management as per surgery.  MRI shows possible mass between pancreatic head and duodenum. Pathology report show SUGGESTIVE OF RUPTURED INFLAMED CYSTIC STRUCTURE WITH  REACTIVE AND ATROPHIC PANCREATIC PARENCHYMAL CHANGE.  Medically improving and stable. I will sign off. PT evaluation suggested skilled nursing facility placement. Management plans discussed with the patient, his son and they are in agreement.  CODE STATUS: full code.  Discussed with nursing.  TOTAL TIME TAKING CARE OF THIS PATIENT: 25 minutes.    POSSIBLE D/C tomorrow, DEPENDING ON CLINICAL CONDITION.   Demetrios Loll M.D on 01/30/2016 at 3:26 PM  Between 7am to 6pm - Pager - [phone] After 6pm go to [email or web address] - password [email or web address] [facility]  Office  [phone]  CC: Primary care physician; Dicky Doe, MD  Note: This dictation was prepared with Dragon dictation along with smaller phrase technology. Any transcriptional errors that result from this process are unintentional.

## 2016-01-30 NOTE — Progress Notes (Signed)
MEDICATION RELATED CONSULT NOTE - INITIAL   Pharmacy Consult for Electrolytes Indication: hypomagnesemia, hypophosphatemia  Allergies  Allergen Reactions  . Cyclobenzaprine     Other reaction(s): Other (See Comments) Frequent urination  . Flucytosine Other (See Comments)    Sore mouth  . Fluticasone-Salmeterol     Other reaction(s): Other (See Comments) Sore mouth  . Naproxen Nausea Only    GI upset  . Other Other (See Comments)    Other reaction(s): Other (See Comments) Frequent urination Other reaction(s): Other (See Comments) Frequent urination Frequent urination   Patient Measurements: Height: 5\' 2"  (157.5 cm) Weight: 146 lb (66.2 kg) IBW/kg (Calculated) : 50.1  Vital Signs: Temp: 98.2 F (36.8 C) (11/30 0529) Temp Source: Oral (11/30 0529) BP: 143/70 (11/30 0529) Pulse Rate: 84 (11/30 0529) Intake/Output from previous day: 11/29 0701 - 11/30 0700 In: 1950 [P.O.:240; I.V.:1710] Out: 2880 [Urine:2850; Drains:30] Intake/Output from this shift: No intake/output data recorded.  Labs:  Recent Labs  01/28/16 0510 01/29/16 0515 01/30/16 0546  WBC 13.5* 9.8  --   HGB 10.3* 10.3*  --   HCT 29.8* 29.6*  --   PLT 423 477*  --   CREATININE 0.67 0.63 0.68  MG 1.6* 1.7 1.7  PHOS 2.3* 2.3* 2.7  ALBUMIN 2.4*  --   --   PROT 6.3*  --   --   AST 21  --   --   ALT 9*  --   --   ALKPHOS 58  --   --   BILITOT 0.5  --   --    Estimated Creatinine Clearance: 55 mL/min (by C-G formula based on SCr of 0.68 mg/dL).  Assessment: Pharmacy consulted to monitor and replace electrolytes as needed in this 75 year old female admitted with a pancreatic mass who is S/P exploratory laparotomy and biopsy.  Goal of Therapy: Electrolytes WNL  Plan:  11/30  K: 4.4    Mg: 1.7    Phos: 2.7 No supplementation needed at this time. D5/NS w/KCL 86mEq/L stopped.  Will recheck electrolytes (K+, Mg, and Phos) with am labs.   Nancy Fetter, PharmD, BCPS  Clinical  Pharmacist 01/30/2016 10:28 AM

## 2016-01-30 NOTE — Progress Notes (Signed)
01/30/2016  Subjective: Patient is 6 Days Post-Op status post expiratory laparotomy with intraoperative biopsy. Biopsy results were negative for malignancy. Patient did have bowel movement yesterday after a suppository. Reports improved pain and no nausea this morning. Has been ambulating with assistance.  Vital signs: Temp:  [97.7 F (36.5 C)-98.2 F (36.8 C)] 98.2 F (36.8 C) (11/30 0529) Pulse Rate:  [79-84] 84 (11/30 0529) Resp:  [16-19] 16 (11/30 0529) BP: (116-143)/(52-70) 143/70 (11/30 0529) SpO2:  [91 %-96 %] 92 % (11/30 0529)   Intake/Output: 11/29 0701 - 11/30 0700 In: 1950 [P.O.:240; I.V.:1710] Out: 2880 [Urine:2850; Drains:30] Last BM Date: 01/29/16  Physical Exam: Constitutional: No acute distress Abdomen: Soft, nondistended, appropriately tender to palpation over the midline incision. Incision is clean dry and intact with staples. No evidence of infection. Right-sided JP drain with minimal serosanguineous fluid.  Labs:   Recent Labs  01/28/16 0510 01/29/16 0515  WBC 13.5* 9.8  HGB 10.3* 10.3*  HCT 29.8* 29.6*  PLT 423 477*    Recent Labs  01/29/16 0515 01/30/16 0546  NA 130* 130*  K 3.6 4.4  CL 98* 99*  CO2 26 26  GLUCOSE 108* 98  BUN <5* <5*  CREATININE 0.63 0.68  CALCIUM 8.0* 7.9*   No results for input(s): LABPROT, INR in the last 72 hours.  Imaging: No results found.  Assessment/Plan: 75 year old female status post expiratory laparotomy.  -Advance patient's diet to soft diet today. -Discontinue IV fluids. -Continue bowel regimen medications. -Likely discharge to SNF tomorrow   Melvyn Neth, [place]

## 2016-01-31 ENCOUNTER — Encounter
Admission: RE | Admit: 2016-01-31 | Discharge: 2016-01-31 | Disposition: A | Discharge status: Home / Self Care | Payer: Medicare Other | Source: Ambulatory Visit | Attending: Internal Medicine | Admitting: Internal Medicine

## 2016-01-31 DIAGNOSIS — K265 Chronic or unspecified duodenal ulcer with perforation: Secondary | ICD-10-CM

## 2016-01-31 DIAGNOSIS — E871 Hypo-osmolality and hyponatremia: Secondary | ICD-10-CM | POA: Insufficient documentation

## 2016-01-31 DIAGNOSIS — D649 Anemia, unspecified: Secondary | ICD-10-CM | POA: Insufficient documentation

## 2016-01-31 LAB — BASIC METABOLIC PANEL
Anion gap: 7 (ref 5–15)
BUN: 7 mg/dL (ref 6–20)
CALCIUM: 8.4 mg/dL — AB (ref 8.9–10.3)
CO2: 24 mmol/L (ref 22–32)
CREATININE: 0.6 mg/dL (ref 0.44–1.00)
Chloride: 98 mmol/L — ABNORMAL LOW (ref 101–111)
Glucose, Bld: 118 mg/dL — ABNORMAL HIGH (ref 65–99)
Potassium: 4.4 mmol/L (ref 3.5–5.1)
SODIUM: 129 mmol/L — AB (ref 135–145)

## 2016-01-31 LAB — PHOSPHORUS: PHOSPHORUS: 2.4 mg/dL — AB (ref 2.5–4.6)

## 2016-01-31 LAB — MAGNESIUM: Magnesium: 1.7 mg/dL (ref 1.7–2.4)

## 2016-01-31 MED ORDER — BOOST / RESOURCE BREEZE PO LIQD: 1.0000 | Freq: Three times a day (TID) | ORAL | 0 refills | Status: DC

## 2016-01-31 MED ORDER — AMOXICILLIN-POT CLAVULANATE 875-125 MG PO TABS: 1.0000 | ORAL_TABLET | Freq: Two times a day (BID) | ORAL | 0 refills | Status: AC

## 2016-01-31 MED ORDER — MAGNESIUM SULFATE 2 GM/50ML IV SOLN
2.0000 g | Freq: Once | INTRAVENOUS | Status: AC
  Administered 2016-01-31: 2 g via INTRAVENOUS
  Filled 2016-01-31 (×2): qty 50

## 2016-01-31 MED ORDER — PANTOPRAZOLE SODIUM 40 MG PO TBEC: 40.0000 mg | DELAYED_RELEASE_TABLET | Freq: Every day | ORAL | 3 refills | Status: DC

## 2016-01-31 MED ORDER — OXYCODONE HCL 5 MG PO TABS: 5.0000 mg | ORAL_TABLET | ORAL | 0 refills | Status: DC | PRN

## 2016-01-31 MED ORDER — BISACODYL 10 MG RE SUPP: 10.0000 mg | Freq: Every day | RECTAL | 0 refills | Status: DC | PRN

## 2016-01-31 MED ORDER — POLYETHYLENE GLYCOL 3350 17 G PO PACK
17.0000 g | PACK | Freq: Every day | ORAL | 0 refills | Status: DC
Start: 2016-01-31 — End: 2016-04-27

## 2016-01-31 NOTE — Progress Notes (Signed)
Discharge instructions given to the son.  Report called to [place] at [facility].  Pt sent out via wheelchair with belongings

## 2016-01-31 NOTE — Progress Notes (Signed)
01/31/2016  Subjective: Patient is 7 Days Post-Op status post exploratory laparotomy with intraoperative biopsy. No acute events overnight. Patient reports continuing to do well without nausea or vomiting and with improving pain. She's tolerating a soft diet. She's having bowel movements.  Vital signs: Temp:  [97.8 F (36.6 C)-98.4 F (36.9 C)] 98.4 F (36.9 C) (12/01 0444) Pulse Rate:  [80-85] 85 (12/01 0444) Resp:  [17-18] 17 (12/01 0444) BP: (91-136)/(54-62) 136/62 (12/01 0444) SpO2:  [91 %-97 %] 95 % (12/01 0444)   Intake/Output: 11/30 0701 - 12/01 0700 In: 300 [P.O.:300] Out: 1520 [Urine:1500; Drains:20] Last BM Date: 01/29/16  Physical Exam: Constitutional: No acute distress, appears comfortable in bed. Abdomen: Soft, nondistended, minimally tender to palpation over the incision. Incision is clean dry and intact with staples. No evidence of infection. Right-sided JP drain with serosanguineous fluid  Labs:   Recent Labs  01/29/16 0515  WBC 9.8  HGB 10.3*  HCT 29.6*  PLT 477*    Recent Labs  01/30/16 0546 01/31/16 0524  NA 130* 129*  K 4.4 4.4  CL 99* 98*  CO2 26 24  GLUCOSE 98 118*  BUN <5* 7  CREATININE 0.68 0.60  CALCIUM 7.9* 8.4*   No results for input(s): LABPROT, INR in the last 72 hours.  Imaging: No results found.  Assessment/Plan: 75 year old female status post expiratory laparotomy and intraoperative biopsy.  -Patient will be discharged to SNF today. -JP drain will be removed prior to discharge.   Melvyn Neth, [place]

## 2016-01-31 NOTE — Discharge Summary (Addendum)
Patient ID: TAZIA NETH MRN: [id number] DOB/AGE: Feb 22, 1941 75 y.o.  Admit date: 01/24/2016 Discharge date: 01/31/2016   Discharge Diagnoses:  Active Problems:   Pancreatic mass   Procedures: Exploratory laparotomy with intraoperative mass biopsy  Hospital Course: She was admitted on 01/24/16 with abdominal pain and tachycardia with leukocytosis with a CT scan concerning for a possible duodenal perforation. She was taken to the operating room that same day and had an expiratory laparotomy. Intraoperatively there was no perforation found but there was concern for potential pancreatic head mass which was biopsied. A JP drain was left in place. Postoperatively patient's diet was slowly advanced as the patient had a mild postoperative ileus. She was kept on IV fluid hydration and her pain medications were transitioned from IV to oral during her hospital course. Pathology results were negative for malignancy revealed a possible cyst structure which could've been possible pancreatic cyst versus a duodenal diverticulum. The patient's pain continued to improve and she was able to ambulate better with the assistance of physical therapy who recommended that the patient be discharged to a skilled nursing facility so she can continue working with PT. Her diet was advanced to a soft diet and her antibiotics were transitioned to oral antibiotics. She was tolerating her diet well, was ambulating with assistance, was voiding without issues, had return of bowel function, and her pain was well-controlled. She was deemed ready for discharge. She will complete an antibiotic course for 4 more days. Her JP drain was removed prior to discharge.  Consults: Internal medicine  Disposition:  skilled nursing facility  Discharge Instructions    Call MD for:  difficulty breathing, headache or visual disturbances    Complete by:  As directed    Call MD for:  persistant nausea and vomiting    Complete by:  As directed     Call MD for:  redness, tenderness, or signs of infection (pain, swelling, redness, odor or green/yellow discharge around incision site)    Complete by:  As directed    Call MD for:  severe uncontrolled pain    Complete by:  As directed    Call MD for:  temperature >100.4    Complete by:  As directed    Diet - low sodium heart healthy    Complete by:  As directed    Discharge instructions    Complete by:  As directed    Patient may shower but do not scrub wounds heavily and dabbed dry only. Do not remove staples.   Driving Restrictions    Complete by:  As directed    Do not drive while taking narcotics for pain control   Increase activity slowly    Complete by:  As directed    Lifting restrictions    Complete by:  As directed    No heavy lifting of more than 10-15 pounds for 4 weeks   No dressing needed    Complete by:  As directed        Medication List    TAKE these medications   ALEVE PM 220-25 MG Tabs Generic drug:  Naproxen Sod-Diphenhydramine Take by mouth.   amLODipine 5 MG tablet Commonly known as:  NORVASC TAKE 1 TABLET BY MOUTH ONCE DAILY.   amoxicillin-clavulanate 875-125 MG tablet Commonly known as:  AUGMENTIN Take 1 tablet by mouth every 12 (twelve) hours.   bisacodyl 10 MG suppository Commonly known as:  DULCOLAX Place 1 suppository (10 mg total) rectally daily as needed for moderate constipation.  budesonide-formoterol 160-4.5 MCG/ACT inhaler Commonly known as:  SYMBICORT Inhale 1 puff into the lungs 2 (two) times daily.   CALCIUM 500/D 500-200 MG-UNIT tablet Generic drug:  calcium-vitamin D Take 1 tablet by mouth daily.   escitalopram 20 MG tablet Commonly known as:  LEXAPRO Take 1 tablet daily   feeding supplement Liqd Take 1 Container by mouth 3 (three) times daily between meals.   fluticasone 50 MCG/ACT nasal spray Commonly known as:  FLONASE Place 2 sprays into the nose daily as needed.   folic acid 1 MG tablet Commonly known as:   FOLVITE Take 1 tablet by mouth daily.   HUMIRA PEN 40 MG/0.8ML Pnkt Generic drug:  Adalimumab Inject into the skin.   HYDROcodone-acetaminophen 5-325 MG tablet Commonly known as:  NORCO/VICODIN Take 1 tablet by mouth daily as needed.   losartan-hydrochlorothiazide 100-25 MG tablet Commonly known as:  HYZAAR TAKE 1 TABLET BY MOUTH ONCE DAILY.   Melatonin 10 MG Tabs Take 2 tablets by mouth at bedtime. Reported on 06/03/2015   montelukast 10 MG tablet Commonly known as:  SINGULAIR TAKE 1 TABLET BY MOUTH ONCE DAILY.   MULTI-VITAMINS Tabs Take 1 tablet by mouth daily.   Omega 3 1000 MG Caps Take 1,000 mg by mouth. omega-3 fatty acids-vitamin E (FISH OIL) 1,000 mg   oxyCODONE 5 MG immediate release tablet Commonly known as:  Oxy IR/ROXICODONE Take 1-2 tablets (5-10 mg total) by mouth every 4 (four) hours as needed for moderate pain or severe pain.   pantoprazole 40 MG tablet Commonly known as:  PROTONIX Take 1 tablet (40 mg total) by mouth daily.   polyethylene glycol packet Commonly known as:  MIRALAX / GLYCOLAX Take 17 g by mouth daily.   simvastatin 20 MG tablet Commonly known as:  ZOCOR Take 1 tablet every other night. What changed:  how much to take  how to take this  when to take this  additional instructions       Contact information for follow-up providers    Phoebe Perch, MD Follow up in 1 week(s).   Specialty:  Surgery Contact information: [address] [address] [phone]            Contact information for after-discharge care    Destination    [facility] Follow up.   Specialty:  [place] information: [address] [place] [phone]

## 2016-01-31 NOTE — Progress Notes (Signed)
IV infiltrated while infusing magnesium.  Dr Hampton Abbot said not to start a new IV.  No new orders

## 2016-02-04 DIAGNOSIS — E871 Hypo-osmolality and hyponatremia: Secondary | ICD-10-CM | POA: Diagnosis not present

## 2016-02-04 DIAGNOSIS — D649 Anemia, unspecified: Secondary | ICD-10-CM | POA: Diagnosis not present

## 2016-02-04 LAB — CBC WITH DIFFERENTIAL/PLATELET
BASOS ABS: 0.1 10*3/uL (ref 0–0.1)
BASOS PCT: 1 %
Eosinophils Absolute: 0.3 10*3/uL (ref 0–0.7)
Eosinophils Relative: 3 %
HEMATOCRIT: 30.3 % — AB (ref 35.0–47.0)
HEMOGLOBIN: 10.3 g/dL — AB (ref 12.0–16.0)
LYMPHS PCT: 19 %
Lymphs Abs: 2 10*3/uL (ref 1.0–3.6)
MCH: 32.3 pg (ref 26.0–34.0)
MCHC: 34.1 g/dL (ref 32.0–36.0)
MCV: 94.5 fL (ref 80.0–100.0)
MONOS PCT: 8 %
Monocytes Absolute: 0.8 10*3/uL (ref 0.2–0.9)
NEUTROS ABS: 7 10*3/uL — AB (ref 1.4–6.5)
NEUTROS PCT: 69 %
Platelets: 547 10*3/uL — ABNORMAL HIGH (ref 150–440)
RBC: 3.2 MIL/uL — ABNORMAL LOW (ref 3.80–5.20)
RDW: 12.9 % (ref 11.5–14.5)
WBC: 10.1 10*3/uL (ref 3.6–11.0)

## 2016-02-04 LAB — COMPREHENSIVE METABOLIC PANEL
ALBUMIN: 2.9 g/dL — AB (ref 3.5–5.0)
ALK PHOS: 78 U/L (ref 38–126)
ALT: 19 U/L (ref 14–54)
AST: 32 U/L (ref 15–41)
Anion gap: 9 (ref 5–15)
BILIRUBIN TOTAL: 0.8 mg/dL (ref 0.3–1.2)
BUN: 8 mg/dL (ref 6–20)
CALCIUM: 9.3 mg/dL (ref 8.9–10.3)
CO2: 27 mmol/L (ref 22–32)
Chloride: 92 mmol/L — ABNORMAL LOW (ref 101–111)
Creatinine, Ser: 0.9 mg/dL (ref 0.44–1.00)
GFR calc Af Amer: >60 mL/min (ref >60)
Glucose, Bld: 91 mg/dL (ref 65–99)
Potassium: 4 mmol/L (ref 3.5–5.1)
Sodium: 128 mmol/L — ABNORMAL LOW (ref 135–145)
TOTAL PROTEIN: 7.9 g/dL (ref 6.5–8.1)

## 2016-02-05 ENCOUNTER — Non-Acute Institutional Stay (SKILLED_NURSING_FACILITY): Payer: Medicare Other | Admitting: Gerontology

## 2016-02-05 DIAGNOSIS — I959 Hypotension, unspecified: Secondary | ICD-10-CM

## 2016-02-05 DIAGNOSIS — E871 Hypo-osmolality and hyponatremia: Secondary | ICD-10-CM | POA: Diagnosis not present

## 2016-02-06 ENCOUNTER — Encounter: Payer: Self-pay | Admitting: Surgery

## 2016-02-06 ENCOUNTER — Ambulatory Visit (INDEPENDENT_AMBULATORY_CARE_PROVIDER_SITE_OTHER): Payer: Medicare Other | Admitting: Surgery

## 2016-02-06 VITALS — BP 145/72 | HR 60 | Temp 98.3°F | Ht 62.0 in | Wt 146.0 lb

## 2016-02-06 DIAGNOSIS — Z9889 Other specified postprocedural states: Secondary | ICD-10-CM

## 2016-02-06 DIAGNOSIS — E871 Hypo-osmolality and hyponatremia: Secondary | ICD-10-CM | POA: Diagnosis not present

## 2016-02-06 LAB — CBC WITH DIFFERENTIAL/PLATELET
BASOS ABS: 0.1 10*3/uL (ref 0–0.1)
BASOS PCT: 1 %
EOS ABS: 0.3 10*3/uL (ref 0–0.7)
EOS PCT: 3 %
HCT: 31.1 % — ABNORMAL LOW (ref 35.0–47.0)
Hemoglobin: 10.7 g/dL — ABNORMAL LOW (ref 12.0–16.0)
Lymphocytes Relative: 16 %
Lymphs Abs: 1.9 10*3/uL (ref 1.0–3.6)
MCH: 32.7 pg (ref 26.0–34.0)
MCHC: 34.5 g/dL (ref 32.0–36.0)
MCV: 94.7 fL (ref 80.0–100.0)
Monocytes Absolute: 0.8 10*3/uL (ref 0.2–0.9)
Monocytes Relative: 7 %
NEUTROS PCT: 73 %
Neutro Abs: 8.4 10*3/uL — ABNORMAL HIGH (ref 1.4–6.5)
PLATELETS: 602 10*3/uL — AB (ref 150–440)
RBC: 3.28 MIL/uL — AB (ref 3.80–5.20)
RDW: 13.3 % (ref 11.5–14.5)
WBC: 11.4 10*3/uL — AB (ref 3.6–11.0)

## 2016-02-06 LAB — BASIC METABOLIC PANEL
Anion gap: 9 (ref 5–15)
BUN: 5 mg/dL — AB (ref 6–20)
CALCIUM: 8.9 mg/dL (ref 8.9–10.3)
CO2: 25 mmol/L (ref 22–32)
CREATININE: 0.85 mg/dL (ref 0.44–1.00)
Chloride: 95 mmol/L — ABNORMAL LOW (ref 101–111)
Glucose, Bld: 87 mg/dL (ref 65–99)
Potassium: 4.4 mmol/L (ref 3.5–5.1)
SODIUM: 129 mmol/L — AB (ref 135–145)

## 2016-02-06 NOTE — Progress Notes (Signed)
02/06/2016  HPI: Patient is status post exploratory laparotomy with intraoperative biopsy of a possible pancreatic head mass with Dr. Burt Knack on 11/24. Pathology report suggested a ruptured inflamed cystic structure with reactive and atrophic pancreatic parenchymal change which could be compatible with a ruptured cyst or a ruptured duodenal diverticulum. There was no malignancy. She was discharged to a skilled nursing facility on 12/1.  Patient reports that she still feels a little bit weak with no appetite. She does try to eat and had grits this morning for breakfast. She still having some soreness over the abdominal incision but no significant pain. She does have regular bowel function.  Vital signs: BP (!) 145/72   Pulse 60   Temp 98.3 F (36.8 C) (Oral)   Ht 5\' 2"  (1.575 m)   Wt 66.2 kg (146 lb)   BMI 26.70 kg/m    Physical Exam: Constitutional: No acute distress Abdomen: Soft, nondistended, minimally tender to palpation. Incisions clean dry and intact with staples which have been removed during this visit and change to Steri-Strips.  Assessment/Plan: 75 year old female status post exploratory laparotomy with intraoperative biopsy.  -Have encouraged patient to try to eat more and to take ensure shakes for improved caloric intake and nutrition. -Patient still has no heavy lifting restriction of more than 10-15 pounds until 12/22. -She may shower and bathe. Allow the Steri-Strips to fall off on their own. -Patient will follow up in 2 weeks for another postoperative check.   Melvyn Neth, [place]

## 2016-02-06 NOTE — Patient Instructions (Addendum)
We have sent a referral to the [facility] to obtain an (EUS) Ultrasound in your throat in approximately 6 weeks. We are awaiting the appointment details at this time. I will call you as soon as I have this scheduled.  We will have you follow-up in 2 weeks. Please see appointment below.  If you begin to feel worse prior to your scheduled appointment, please call our office immediately and speak with a nurse.

## 2016-02-07 ENCOUNTER — Non-Acute Institutional Stay (SKILLED_NURSING_FACILITY): Payer: Medicare Other | Admitting: Gerontology

## 2016-02-07 DIAGNOSIS — E871 Hypo-osmolality and hyponatremia: Secondary | ICD-10-CM

## 2016-02-07 DIAGNOSIS — I959 Hypotension, unspecified: Secondary | ICD-10-CM

## 2016-02-11 ENCOUNTER — Telehealth: Payer: Self-pay

## 2016-02-11 NOTE — Telephone Encounter (Signed)
  Oncology Nurse Navigator Documentation Called and spoke with Elizabeth Bailey. Notified her that I have received the referral from Dr. Hampton Abbot for EUS. It will be performed 03/12/16. She will be at [facility] until at least 12/19. I instructed that I would get back in touch with her after she gets home and settled and then we could go over instructions. She verbalized understanding. Navigator Location: [facility] (02/11/16 1300)   )Navigator Encounter Type: Telephone (02/11/16 1300) Telephone: Outgoing Call (02/11/16 1300)                       Barriers/Navigation Needs: Coordination of Care (02/11/16 1300)   Interventions: Coordination of Care (02/11/16 1300)   Coordination of Care: EUS (02/11/16 1300)                  Time Spent with Patient: 30 (02/11/16 1300)

## 2016-02-12 ENCOUNTER — Other Ambulatory Visit: Payer: Self-pay

## 2016-02-14 ENCOUNTER — Other Ambulatory Visit: Admission: RE | Admit: 2016-02-14 | Payer: Medicare Other | Source: Ambulatory Visit | Admitting: *Deleted

## 2016-02-14 ENCOUNTER — Non-Acute Institutional Stay (SKILLED_NURSING_FACILITY): Payer: Medicare Other | Admitting: Gerontology

## 2016-02-14 DIAGNOSIS — E871 Hypo-osmolality and hyponatremia: Secondary | ICD-10-CM | POA: Diagnosis not present

## 2016-02-14 DIAGNOSIS — I959 Hypotension, unspecified: Secondary | ICD-10-CM | POA: Diagnosis not present

## 2016-02-14 LAB — CBC WITH DIFFERENTIAL/PLATELET
BASOS ABS: 0.1 10*3/uL (ref 0–0.1)
BASOS PCT: 1 %
EOS ABS: 0.3 10*3/uL (ref 0–0.7)
Eosinophils Relative: 3 %
HCT: 28.9 % — ABNORMAL LOW (ref 35.0–47.0)
HEMOGLOBIN: 10.2 g/dL — AB (ref 12.0–16.0)
Lymphocytes Relative: 19 %
Lymphs Abs: 1.6 10*3/uL (ref 1.0–3.6)
MCH: 32.9 pg (ref 26.0–34.0)
MCHC: 35.3 g/dL (ref 32.0–36.0)
MCV: 93.3 fL (ref 80.0–100.0)
Monocytes Absolute: 0.8 10*3/uL (ref 0.2–0.9)
Monocytes Relative: 9 %
NEUTROS ABS: 5.7 10*3/uL (ref 1.4–6.5)
NEUTROS PCT: 68 %
Platelets: 548 10*3/uL — ABNORMAL HIGH (ref 150–440)
RBC: 3.1 MIL/uL — AB (ref 3.80–5.20)
RDW: 13.2 % (ref 11.5–14.5)
WBC: 8.5 10*3/uL (ref 3.6–11.0)

## 2016-02-14 LAB — BASIC METABOLIC PANEL
ANION GAP: 9 (ref 5–15)
BUN: 10 mg/dL (ref 6–20)
CHLORIDE: 94 mmol/L — AB (ref 101–111)
CO2: 28 mmol/L (ref 22–32)
CREATININE: 0.75 mg/dL (ref 0.44–1.00)
Calcium: 9.2 mg/dL (ref 8.9–10.3)
GFR calc non Af Amer: >60 mL/min (ref >60)
Glucose, Bld: 103 mg/dL — ABNORMAL HIGH (ref 65–99)
POTASSIUM: 4.3 mmol/L (ref 3.5–5.1)
SODIUM: 131 mmol/L — AB (ref 135–145)

## 2016-02-17 DIAGNOSIS — E871 Hypo-osmolality and hyponatremia: Secondary | ICD-10-CM | POA: Insufficient documentation

## 2016-02-17 NOTE — Progress Notes (Signed)
Location:      Place of Service:  SNF (31) Provider:  Toni Arthurs, NP-C  Dicky Doe, MD  Patient Care Team: Arlis Porta., MD as PCP - General [facility])  Extended Emergency Contact Information Primary Emergency Contact: Hughes,Olyn Landstrom Address: [address]          [place], [place] [address] Johnnette Litter of [place] Phone: [phone] Mobile Phone: [phone] Relation: Son  Code Status:  full Goals of care: Advanced Directive information Advanced Directives 01/24/2016  Does Patient Have a Medical Advance Directive? No;Yes  Type of Advance Directive -  Does patient want to make changes to medical advance directive? Yes (Inpatient - patient requests chaplain consult to change a medical advance directive)  Copy of [place] in Chart? No - copy requested     Chief Complaint  Patient presents with  . Follow-up    HPI:  Pt is a 75 y.o. female seen today for a follow up visit for hyponatremia and hypotension. 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 She was given Augmentin, which was completed yesterday. Pt denies pain or abdominal tenderness. Pt denies n/v/d/f/c/cp/sob/ha/abd pain/dizziness. Pt reports she was only taking the esacaloprim for difficulty sleeping. Pt is agreeable to GDR and discontinuing this med as a common side effect is hyponatremia. Educated pt on fluid restrictions, IVF, etc. Pt verbalized understanding of the education and is agreeable to short term IVF. Aside from weakness, pt reports she is feeling well. Pt has been on IVF for 48 hours at this point. IV site intact. Fluids infusing well. Will continue fluids for another 48 hours d/t persistent hyponatremia. No other complaints. VSS.   Past Medical History:  Diagnosis Date  . Arthritis   . Asthma   . Chronic bronchitis ([place])   . Collagen vascular disease ([place])   . Depression   . Diverticulosis   . GERD (gastroesophageal reflux  disease)   . High cholesterol   . Hypertension   . Seasonal allergies    Past Surgical History:  Procedure Laterality Date  . ABDOMINAL HYSTERECTOMY    . BREAST BIOPSY  1970's  . COLONOSCOPY  2005  . DG  BONE DENSITY ([place] HX)  2010  . DIAGNOSTIC MAMMOGRAM  2015  . GALLBLADDER SURGERY    . pap smear  2014  . REPAIR OF PERFORATED ULCER N/A 01/24/2016   Procedure: Exploratory Laparotomy with biopsy of Pancreas;  Surgeon: Florene Glen, MD;  Location: [facility] ORS;  Service: General;  Laterality: N/A;    Allergies  Allergen Reactions  . Cyclobenzaprine     Other reaction(s): Other (See Comments) Frequent urination  . Flucytosine Other (See Comments)    Sore mouth  . Fluticasone-Salmeterol Other (See Comments)    Other reaction(s): Other (See Comments) Sore mouth  . Naproxen Nausea Only    GI upset  . Other Other (See Comments)    Other reaction(s): Other (See Comments) Frequent urination Other reaction(s): Other (See Comments) Frequent urination Frequent urination    Allergies as of 02/07/2016      Reactions   Cyclobenzaprine    Other reaction(s): Other (See Comments) Frequent urination   Flucytosine Other (See Comments)   Sore mouth   Fluticasone-salmeterol Other (See Comments)   Other reaction(s): Other (See Comments) Sore mouth   Naproxen Nausea Only   GI upset   Other Other (See Comments)   Other reaction(s): Other (See Comments) Frequent urination Other reaction(s):  Other (See Comments) Frequent urination Frequent urination      Medication List       Accurate as of 02/07/16 11:59 PM. Always use your most recent med list.          ALEVE PM 220-25 MG Tabs Generic drug:  Naproxen Sod-Diphenhydramine Take by mouth.   amLODipine 5 MG tablet Commonly known as:  NORVASC TAKE 1 TABLET BY MOUTH ONCE DAILY.   bisacodyl 10 MG suppository Commonly known as:  DULCOLAX Place 1 suppository (10 mg total) rectally daily as needed for moderate constipation.     budesonide-formoterol 160-4.5 MCG/ACT inhaler Commonly known as:  SYMBICORT Inhale 1 puff into the lungs 2 (two) times daily.   CALCIUM 500/D 500-200 MG-UNIT tablet Generic drug:  calcium-vitamin D Take 1 tablet by mouth daily.   escitalopram 20 MG tablet Commonly known as:  LEXAPRO Take 1 tablet daily   feeding supplement Liqd Take 1 Container by mouth 3 (three) times daily between meals.   fluticasone 50 MCG/ACT nasal spray Commonly known as:  FLONASE Place 2 sprays into the nose daily as needed.   folic acid 1 MG tablet Commonly known as:  FOLVITE Take 1 tablet by mouth daily.   HUMIRA PEN 40 MG/0.8ML Pnkt Generic drug:  Adalimumab Inject into the skin.   HYDROcodone-acetaminophen 5-325 MG tablet Commonly known as:  NORCO/VICODIN Take 1 tablet by mouth daily as needed.   losartan-hydrochlorothiazide 100-25 MG tablet Commonly known as:  HYZAAR TAKE 1 TABLET BY MOUTH ONCE DAILY.   Melatonin 10 MG Tabs Take 2 tablets by mouth at bedtime. Reported on 06/03/2015   montelukast 10 MG tablet Commonly known as:  SINGULAIR TAKE 1 TABLET BY MOUTH ONCE DAILY.   MULTI-VITAMINS Tabs Take 1 tablet by mouth daily.   Omega 3 1000 MG Caps Take 1,000 mg by mouth. omega-3 fatty acids-vitamin E (FISH OIL) 1,000 mg   oxyCODONE 5 MG immediate release tablet Commonly known as:  Oxy IR/ROXICODONE Take 1-2 tablets (5-10 mg total) by mouth every 4 (four) hours as needed for moderate pain or severe pain.   pantoprazole 40 MG tablet Commonly known as:  PROTONIX Take 1 tablet (40 mg total) by mouth daily.   polyethylene glycol packet Commonly known as:  MIRALAX / GLYCOLAX Take 17 g by mouth daily.   simvastatin 20 MG tablet Commonly known as:  ZOCOR Take 1 tablet every other night.       Review of Systems  Constitutional: Negative for activity change, appetite change, chills, diaphoresis and fever.  HENT: Negative for congestion, sneezing, sore throat, trouble swallowing and  voice change.   Eyes: Negative.   Respiratory: Negative for apnea, cough, choking, chest tightness, shortness of breath and wheezing.   Cardiovascular: Negative for chest pain, palpitations and leg swelling.  Gastrointestinal: Negative for abdominal distention, abdominal pain, constipation, diarrhea and nausea.  Endocrine: Negative.   Genitourinary: Negative for difficulty urinating, dysuria, frequency and urgency.  Musculoskeletal: Negative for back pain, gait problem and myalgias. Arthralgias: typical arthritis.  Skin: Negative for color change, pallor, rash and wound.  Allergic/Immunologic: Negative.   Neurological: Negative for dizziness, tremors, syncope, speech difficulty, weakness, light-headedness, numbness and headaches.  Psychiatric/Behavioral: Negative.   All other systems reviewed and are negative.   Immunization History  Administered Date(s) Administered  . Influenza, High Dose Seasonal PF 11/28/2014, 12/05/2015  . Pneumococcal Conjugate-13 01/09/2015  . Pneumococcal-Unspecified 11/28/2011   Pertinent  Health Maintenance Due  Topic Date Due  . COLONOSCOPY  02/24/2016 (Originally  11/19/2013)  . MAMMOGRAM  01/15/2018  . INFLUENZA VACCINE  Completed  . DEXA SCAN  Completed  . PNA vac Low Risk Adult  Completed   Fall Risk  12/05/2015 11/28/2014 11/28/2014  Falls in the past year? No No No   Functional Status Survey:    Vitals:   02/07/16 0545  BP: 130/72  Pulse: 76  Resp: 20  Temp: 98.4 F (36.9 C)  SpO2: 96%   There is no height or weight on file to calculate BMI. Physical Exam  Constitutional: She is oriented to person, place, and time. Vital signs are normal. She appears well-developed and well-nourished. She is active and cooperative. She does not appear ill. No distress.  HENT:  Head: Normocephalic and atraumatic.  Mouth/Throat: Uvula is midline, oropharynx is clear and moist and mucous membranes are normal. Mucous membranes are not pale, not dry and not  cyanotic.  Eyes: Conjunctivae, EOM and lids are normal. Pupils are equal, round, and reactive to light.  Neck: Trachea normal, normal range of motion and full passive range of motion without pain. Neck supple. No JVD present. No tracheal deviation, no edema and no erythema present. No thyromegaly present.  Cardiovascular: Normal rate, regular rhythm, normal heart sounds, intact distal pulses and normal pulses.  Exam reveals no gallop, no distant heart sounds and no friction rub.   No murmur heard. Pulmonary/Chest: Effort normal and breath sounds normal. No accessory muscle usage. No respiratory distress. She has no wheezes. She has no rales. She exhibits no tenderness.  Abdominal: Normal appearance and bowel sounds are normal. She exhibits no distension and no ascites. There is no tenderness.  Musculoskeletal: Normal range of motion. She exhibits no edema or tenderness.  Expected osteoarthritis, stiffness  Neurological: She is alert and oriented to person, place, and time. She has normal strength.  Skin: Skin is warm, dry and intact. No rash noted. She is not diaphoretic. No cyanosis or erythema. No pallor. Nails show no clubbing.  Psychiatric: She has a normal mood and affect. Her speech is normal and behavior is normal. Judgment and thought content normal. Cognition and memory are normal.  Nursing note and vitals reviewed.   Labs reviewed:  Recent Labs  01/29/16 0515 01/30/16 0546 01/31/16 0524 02/04/16 0624 02/06/16 0545 02/14/16 0615  NA 130* 130* 129* 128* 129* 131*  K 3.6 4.4 4.4 4.0 4.4 4.3  CL 98* 99* 98* 92* 95* 94*  CO2 _0 GLUCOSE 108* 98 118* 91 87 103*  BUN <5* <5* 7 8 5* 10  CREATININE 0.63 0.68 0.60 0.90 0.85 0.75  CALCIUM 8.0* 7.9* 8.4* 9.3 8.9 9.2  MG 1.7 1.7 1.7  --   --   --   PHOS 2.3* 2.7 2.4*  --   --   --     Recent Labs  01/24/16 1128 01/28/16 0510 02/04/16 0624  AST 25 21 32  ALT 12* 9* 19  ALKPHOS 62 58 78  BILITOT 0.8 0.5 0.8    PROT 7.9 6.3* 7.9  ALBUMIN 3.5 2.4* 2.9*    Recent Labs  02/04/16 0624 02/06/16 1200 02/14/16 0615  WBC 10.1 11.4* 8.5  NEUTROABS 7.0* 8.4* 5.7  HGB 10.3* 10.7* 10.2*  HCT 30.3* 31.1* 28.9*  MCV 94.5 94.7 93.3  PLT 547* 602* 548*   Lab Results  Component Value Date   TSH 2.500 12/12/2014   No results found for: HGBA1C Lab Results  Component Value Date   CHOL 178  12/12/2014   HDL 84 12/12/2014   LDLCALC 80 12/12/2014   TRIG 72 12/12/2014   CHOLHDL 2.1 12/12/2014    Significant Diagnostic Results in last 30 days:  Dg Chest 2 View  Result Date: 01/24/2016 CLINICAL DATA:  Chest pain EXAM: CHEST  2 VIEW COMPARISON:  August 09, 2012 FINDINGS: There is no edema or consolidation. The heart size and pulmonary vascularity are normal. No adenopathy. There is mild aortic atherosclerosis. There is degenerative change in the thoracic spine. IMPRESSION: Aortic atherosclerosis.  No edema or consolidation. Electronically Signed   By: Lowella Grip III M.D.   On: 01/24/2016 11:32   Ct Angio Chest Pe W And/or Wo Contrast  Result Date: 01/24/2016 CLINICAL DATA:  Right-sided chest pain EXAM: CT ANGIOGRAPHY CHEST WITH CONTRAST TECHNIQUE: Multidetector CT imaging of the chest was performed using the standard protocol during bolus administration of intravenous contrast. Multiplanar CT image reconstructions and MIPs were obtained to evaluate the vascular anatomy. CONTRAST:  100 mL Isovue 370. COMPARISON:  04/22/2011. FINDINGS: Cardiovascular: Aortic calcifications are noted without aneurysmal dilatation or dissection. Mild coronary calcifications are seen. Pulmonary artery demonstrates a normal branching pattern without intraluminal filling defect to suggest pulmonary embolism. Mediastinum/Nodes: The thoracic inlet is within normal limits. No hilar or mediastinal adenopathy is identified. Lungs/Pleura: The lungs are well aerated bilaterally. Scattered small parenchymal nodules are noted  particularly in the right lower lobe the largest of which measures 9 mm on image number 70 of series 6. When compared with the prior exam there relatively stable and likely benign in etiology. Upper Abdomen: There is thickening at the level of the gastroesophageal junction which may be related to reflux. In the duodenum there is a an outpouching of contrast material and air identified with significant inflammatory changes likely representing a contained perforated ulcer this is incompletely evaluated on this exam. Musculoskeletal: Degenerative changes of the thoracic spine are noted. No acute bony abnormality is seen. Review of the MIP images confirms the above findings. IMPRESSION: No evidence of pulmonary emboli. Stable parenchymal nodules in the right lower lobe. Changes suggestive of a perforated duodenal ulcer. Electronically Signed   By: Inez Catalina M.D.   On: 01/24/2016 14:23   Ct Abdomen Pelvis W Contrast  Result Date: 01/24/2016 CLINICAL DATA:  Right side rib cage pain, nausea and vomiting EXAM: CT ABDOMEN AND PELVIS WITH CONTRAST TECHNIQUE: Multidetector CT imaging of the abdomen and pelvis was performed using the standard protocol following bolus administration of intravenous contrast. CONTRAST:  100 cc Isovue COMPARISON:  CT chest same day FINDINGS: Lower chest: No acute findings in lung bases Hepatobiliary: Enhanced liver is normal. The patient is status post cholecystectomy. CBD measures 7 mm in diameter. Pancreas: Enhanced pancreas is normal. Spleen: Enhanced spleen is normal. Adrenals/Urinary Tract: No adrenal gland mass. Enhanced kidneys are symmetrical in size. No hydronephrosis or hydroureter. The urinary bladder is unremarkable. Delayed renal images shows bilateral renal symmetrical excretion. Stomach/Bowel: There is thickening of proximal duodenal wall. On axial image 24 there is extraluminal contrast collection adjacent/ medial to duodenum and lateral to pancreatic head. On delayed images  this collection measures 2.2 cm in diameter. There is increased small amount of extraluminal air on delay images. Findings are consistent with duodenal perforation probable perforated duodenal ulcer. Further correlation with endoscopy is recommended. Sigmoid colon diverticula are noted. There is some thickening of sigmoid colon wall probable due to chronic diverticulosis. No definite evidence of acute diverticulitis. Vascular/Lymphatic: Atherosclerotic calcifications of abdominal aorta and iliac arteries  are noted. Reproductive: The patient is status post hysterectomy. Other: No pelvic ascites. No pericecal inflammation. Normal appendix is noted in axial image 47. Musculoskeletal: No destructive bony lesions are noted. Sagittal images of the spine shows degenerative changes thoracolumbar spine. IMPRESSION: 1. There is extraluminal contrast material just medial to duodenum. There is small amount of extraluminal air medial to duodenum. Findings highly suspicious for perforated duodenal ulcer. Clinical correlation is necessary. Correlation with endoscopy and/or surgical consult is recommended. 2. Status postcholecystectomy. 3. No hydronephrosis or hydroureter. 4. Status post hysterectomy. 5. Degenerative changes thoracic spine. 6. Sigmoid diverticulosis without definite evidence of acute diverticulitis. These results were called by telephone at the time of interpretation on 01/24/2016 at 2:21 pm to Dr. Conni Slipper , who verbally acknowledged these results. Electronically Signed   By: Lahoma Crocker M.D.   On: 01/24/2016 14:22   Mr 3d Recon At Scanner  Result Date: 01/27/2016 CLINICAL DATA:  Evaluate pancreatic mass. History of perforated duodenal ulcer and pancreatic mass seen and surgery. EXAM: MRI ABDOMEN WITHOUT AND WITH CONTRAST (INCLUDING MRCP) TECHNIQUE: Multiplanar multisequence MR imaging of the abdomen was performed both before and after the administration of intravenous contrast. Heavily T2-weighted images  of the biliary and pancreatic ducts were obtained, and three-dimensional MRCP images were rendered by post processing. CONTRAST:  48m MULTIHANCE GADOBENATE DIMEGLUMINE 529 MG/ML IV SOLN COMPARISON:  CT scan 01/24/2016 FINDINGS: Lower chest: Small bilateral pleural effusions and overlying atelectasis. No pericardial effusion. Hepatobiliary: No focal hepatic lesions or intrahepatic biliary dilatation. Normal caliber and course of the common bile duct. No common bile duct stones, mass or obstruction. The gallbladder is surgically absent. Pancreas: Very limited examination due to motion artifact. Indistinct soft tissue fullness noted along the posterior aspect of the lower pancreatic head adjacent to the duodenum but very difficult to identified and measure a discrete mass. Normal caliber and course of the main pancreatic duct. The pancreatic body and tail show mild atrophy but no mass. Spleen:  Normal size.  No focal lesions. Adrenals/Urinary Tract:  No significant findings. Stomach/Bowel: The stomach is moderately distended. There is persistent inflammatory type changes involving the second portion duodenum along with a moderate-sized duodenum diverticulum. No new adrenal obstruction. Vascular/Lymphatic: The major vascular structures are patent. The aorta and branch vessels are unremarkable. There are small gastrohepatic ligament and periportal lymph nodes. Other:  Small amount of free abdominal fluid. Musculoskeletal: No significant findings. IMPRESSION: 1. Limited examination due to patient motion. 2. Ill-defined soft tissue fullness between the pancreatic head and duodenum likely reflecting the mass abuts identified at surgery. Dedicated pancreatic CT protocol may be helpful for further evaluation and surgical planning. 3. Small gastrohepatic ligament periportal and celiac axis lymph nodes are indeterminate. 4. Distended stomach but no obvious doing renal obstruction. Moderate-sized duodenum diverticulum. 5. No  definite MR findings for metastatic disease. 6. Bilateral pleural effusions and mild to moderate free abdominal fluid. Electronically Signed   By: PMarijo SanesM.D.   On: 01/27/2016 11:55   Mr Abdomen Mrcp WMoise BoringContast  Result Date: 01/27/2016 CLINICAL DATA:  Evaluate pancreatic mass. History of perforated duodenal ulcer and pancreatic mass seen and surgery. EXAM: MRI ABDOMEN WITHOUT AND WITH CONTRAST (INCLUDING MRCP) TECHNIQUE: Multiplanar multisequence MR imaging of the abdomen was performed both before and after the administration of intravenous contrast. Heavily T2-weighted images of the biliary and pancreatic ducts were obtained, and three-dimensional MRCP images were rendered by post processing. CONTRAST:  155mMULTIHANCE GADOBENATE DIMEGLUMINE 529 MG/ML IV SOLN  COMPARISON:  CT scan 01/24/2016 FINDINGS: Lower chest: Small bilateral pleural effusions and overlying atelectasis. No pericardial effusion. Hepatobiliary: No focal hepatic lesions or intrahepatic biliary dilatation. Normal caliber and course of the common bile duct. No common bile duct stones, mass or obstruction. The gallbladder is surgically absent. Pancreas: Very limited examination due to motion artifact. Indistinct soft tissue fullness noted along the posterior aspect of the lower pancreatic head adjacent to the duodenum but very difficult to identified and measure a discrete mass. Normal caliber and course of the main pancreatic duct. The pancreatic body and tail show mild atrophy but no mass. Spleen:  Normal size.  No focal lesions. Adrenals/Urinary Tract:  No significant findings. Stomach/Bowel: The stomach is moderately distended. There is persistent inflammatory type changes involving the second portion duodenum along with a moderate-sized duodenum diverticulum. No new adrenal obstruction. Vascular/Lymphatic: The major vascular structures are patent. The aorta and branch vessels are unremarkable. There are small gastrohepatic ligament  and periportal lymph nodes. Other:  Small amount of free abdominal fluid. Musculoskeletal: No significant findings. IMPRESSION: 1. Limited examination due to patient motion. 2. Ill-defined soft tissue fullness between the pancreatic head and duodenum likely reflecting the mass abuts identified at surgery. Dedicated pancreatic CT protocol may be helpful for further evaluation and surgical planning. 3. Small gastrohepatic ligament periportal and celiac axis lymph nodes are indeterminate. 4. Distended stomach but no obvious doing renal obstruction. Moderate-sized duodenum diverticulum. 5. No definite MR findings for metastatic disease. 6. Bilateral pleural effusions and mild to moderate free abdominal fluid. Electronically Signed   By: Marijo Sanes M.D.   On: 01/27/2016 11:55    Assessment/Plan 1. Hyponatremia  D/C Hyzaar  Wean down, then D/C escitalopram (pt reports she was taking it for inability to sleep)  Continue 0.9% NS IV fluids at 50 mL/ hr x 48 more hours  Then recheck Na+ level  May continue IVF for ongoing hyponatremia  2. Hypotension, unspecified hypotension type  Continue IV fluids  Family/ staff Communication:   Total Time:  Documentation:  Face to Face:  Family/Phone:   Labs/tests ordered:  Cbc, met b  Medication list reviewed and assessed for continued appropriateness.  Vikki Ports, NP-C [facility] [phone] [place],  [id number] Cell Phone (Mon-Fri 8am-5pm):  [phone] On Call:  [phone] & follow prompts after 5pm & weekends Office Phone:  [phone] Office Fax:  [phone]

## 2016-02-17 NOTE — Progress Notes (Signed)
Location:      Place of Service:  SNF (31) Provider:  Toni Arthurs, NP-C  Dicky Doe, MD  Patient Care Team: Arlis Porta., MD as PCP - General [facility])  Extended Emergency Contact Information Primary Emergency Contact: Hughes,Nyara Capell Address: [address]          [place], [place] [address] Johnnette Litter of [place] Phone: [phone] Mobile Phone: [phone] Relation: Son  Code Status:  full Goals of care: Advanced Directive information Advanced Directives 01/24/2016  Does Patient Have a Medical Advance Directive? No;Yes  Type of Advance Directive -  Does patient want to make changes to medical advance directive? Yes (Inpatient - patient requests chaplain consult to change a medical advance directive)  Copy of [place] in Chart? No - copy requested     Chief Complaint  Patient presents with  . Follow-up    HPI:  Pt is a 75 y.o. female seen today for a follow up visit for hyponatremia and hypotension. Pt denies pain or abdominal tenderness. Pt denies n/v/d/f/c/cp/sob/ha/abd pain/dizziness. Pt reports she was only taking the esacaloprim for difficulty sleeping. Pt is agreeable to GDR and discontinuing this med as a common side effect is hyponatremia. GDR is now complete and pr is no longer taking this med. Educated pt on fluid restrictions, IVF, etc. Pt verbalized understanding of the education. Aside from weakness, pt reports she is feeling well. Will continue to monitor labs d/t persistent hyponatremia. No other complaints. VSS.   Past Medical History:  Diagnosis Date  . Arthritis   . Asthma   . Chronic bronchitis ([place])   . Collagen vascular disease ([place])   . Depression   . Diverticulosis   . GERD (gastroesophageal reflux disease)   . High cholesterol   . Hypertension   . Seasonal allergies    Past Surgical History:  Procedure Laterality Date  . ABDOMINAL HYSTERECTOMY    . BREAST BIOPSY  1970's  . COLONOSCOPY  2005    . DG  BONE DENSITY ([place] HX)  2010  . DIAGNOSTIC MAMMOGRAM  2015  . GALLBLADDER SURGERY    . pap smear  2014  . REPAIR OF PERFORATED ULCER N/A 01/24/2016   Procedure: Exploratory Laparotomy with biopsy of Pancreas;  Surgeon: Florene Glen, MD;  Location: [facility] ORS;  Service: General;  Laterality: N/A;    Allergies  Allergen Reactions  . Cyclobenzaprine     Other reaction(s): Other (See Comments) Frequent urination  . Flucytosine Other (See Comments)    Sore mouth  . Fluticasone-Salmeterol Other (See Comments)    Other reaction(s): Other (See Comments) Sore mouth  . Naproxen Nausea Only    GI upset  . Other Other (See Comments)    Other reaction(s): Other (See Comments) Frequent urination Other reaction(s): Other (See Comments) Frequent urination Frequent urination    Allergies as of 02/14/2016      Reactions   Cyclobenzaprine    Other reaction(s): Other (See Comments) Frequent urination   Flucytosine Other (See Comments)   Sore mouth   Fluticasone-salmeterol Other (See Comments)   Other reaction(s): Other (See Comments) Sore mouth   Naproxen Nausea Only   GI upset   Other Other (See Comments)   Other reaction(s): Other (See Comments) Frequent urination Other reaction(s): Other (See Comments) Frequent urination Frequent urination      Medication List       Accurate as of 02/14/16 11:59 PM. Always use your most recent med list.  ALEVE PM 220-25 MG Tabs Generic drug:  Naproxen Sod-Diphenhydramine Take by mouth.   amLODipine 5 MG tablet Commonly known as:  NORVASC TAKE 1 TABLET BY MOUTH ONCE DAILY.   bisacodyl 10 MG suppository Commonly known as:  DULCOLAX Place 1 suppository (10 mg total) rectally daily as needed for moderate constipation.   budesonide-formoterol 160-4.5 MCG/ACT inhaler Commonly known as:  SYMBICORT Inhale 1 puff into the lungs 2 (two) times daily.   CALCIUM 500/D 500-200 MG-UNIT tablet Generic drug:  calcium-vitamin  D Take 1 tablet by mouth daily.   escitalopram 20 MG tablet Commonly known as:  LEXAPRO Take 1 tablet daily   feeding supplement Liqd Take 1 Container by mouth 3 (three) times daily between meals.   fluticasone 50 MCG/ACT nasal spray Commonly known as:  FLONASE Place 2 sprays into the nose daily as needed.   folic acid 1 MG tablet Commonly known as:  FOLVITE Take 1 tablet by mouth daily.   HUMIRA PEN 40 MG/0.8ML Pnkt Generic drug:  Adalimumab Inject into the skin.   HYDROcodone-acetaminophen 5-325 MG tablet Commonly known as:  NORCO/VICODIN Take 1 tablet by mouth daily as needed.   losartan-hydrochlorothiazide 100-25 MG tablet Commonly known as:  HYZAAR TAKE 1 TABLET BY MOUTH ONCE DAILY.   Melatonin 10 MG Tabs Take 2 tablets by mouth at bedtime. Reported on 06/03/2015   montelukast 10 MG tablet Commonly known as:  SINGULAIR TAKE 1 TABLET BY MOUTH ONCE DAILY.   MULTI-VITAMINS Tabs Take 1 tablet by mouth daily.   Omega 3 1000 MG Caps Take 1,000 mg by mouth. omega-3 fatty acids-vitamin E (FISH OIL) 1,000 mg   oxyCODONE 5 MG immediate release tablet Commonly known as:  Oxy IR/ROXICODONE Take 1-2 tablets (5-10 mg total) by mouth every 4 (four) hours as needed for moderate pain or severe pain.   pantoprazole 40 MG tablet Commonly known as:  PROTONIX Take 1 tablet (40 mg total) by mouth daily.   polyethylene glycol packet Commonly known as:  MIRALAX / GLYCOLAX Take 17 g by mouth daily.   simvastatin 20 MG tablet Commonly known as:  ZOCOR Take 1 tablet every other night.       Review of Systems  Constitutional: Negative for activity change, appetite change, chills, diaphoresis and fever.  HENT: Negative for congestion, sneezing, sore throat, trouble swallowing and voice change.   Eyes: Negative.   Respiratory: Negative for apnea, cough, choking, chest tightness, shortness of breath and wheezing.   Cardiovascular: Negative for chest pain, palpitations and leg  swelling.  Gastrointestinal: Negative for abdominal distention, abdominal pain, constipation, diarrhea and nausea.  Endocrine: Negative.   Genitourinary: Negative for difficulty urinating, dysuria, frequency and urgency.  Musculoskeletal: Negative for back pain, gait problem and myalgias. Arthralgias: typical arthritis.  Skin: Negative for color change, pallor, rash and wound.  Allergic/Immunologic: Negative.   Neurological: Negative for dizziness, tremors, syncope, speech difficulty, weakness, light-headedness, numbness and headaches.  Psychiatric/Behavioral: Negative.   All other systems reviewed and are negative.   Immunization History  Administered Date(s) Administered  . Influenza, High Dose Seasonal PF 11/28/2014, 12/05/2015  . Pneumococcal Conjugate-13 01/09/2015  . Pneumococcal-Unspecified 11/28/2011   Pertinent  Health Maintenance Due  Topic Date Due  . COLONOSCOPY  02/24/2016 (Originally 11/19/2013)  . MAMMOGRAM  01/15/2018  . INFLUENZA VACCINE  Completed  . DEXA SCAN  Completed  . PNA vac Low Risk Adult  Completed   Fall Risk  12/05/2015 11/28/2014 11/28/2014  Falls in the past year? No No  No   Functional Status Survey:    Vitals:   02/14/16 0700  BP: 124/63  Pulse: 66  Resp: 20  Temp: 99 F (37.2 C)  SpO2: 96%   There is no height or weight on file to calculate BMI. Physical Exam  Constitutional: She is oriented to person, place, and time. Vital signs are normal. She appears well-developed and well-nourished. She is active and cooperative. She does not appear ill. No distress.  HENT:  Head: Normocephalic and atraumatic.  Mouth/Throat: Uvula is midline, oropharynx is clear and moist and mucous membranes are normal. Mucous membranes are not pale, not dry and not cyanotic.  Eyes: Conjunctivae, EOM and lids are normal. Pupils are equal, round, and reactive to light.  Neck: Trachea normal, normal range of motion and full passive range of motion without pain. Neck  supple. No JVD present. No tracheal deviation, no edema and no erythema present. No thyromegaly present.  Cardiovascular: Normal rate, regular rhythm, normal heart sounds, intact distal pulses and normal pulses.  Exam reveals no gallop, no distant heart sounds and no friction rub.   No murmur heard. Pulmonary/Chest: Effort normal and breath sounds normal. No accessory muscle usage. No respiratory distress. She has no wheezes. She has no rales. She exhibits no tenderness.  Abdominal: Normal appearance and bowel sounds are normal. She exhibits no distension and no ascites. There is no tenderness.  Musculoskeletal: Normal range of motion. She exhibits no edema or tenderness.  Expected osteoarthritis, stiffness  Neurological: She is alert and oriented to person, place, and time. She has normal strength.  Skin: Skin is warm, dry and intact. No rash noted. She is not diaphoretic. No cyanosis or erythema. No pallor. Nails show no clubbing.  Psychiatric: She has a normal mood and affect. Her speech is normal and behavior is normal. Judgment and thought content normal. Cognition and memory are normal.  Nursing note and vitals reviewed.   Labs reviewed:  Recent Labs  01/29/16 0515 01/30/16 0546 01/31/16 0524 02/04/16 0624 02/06/16 0545 02/14/16 0615  NA 130* 130* 129* 128* 129* 131*  K 3.6 4.4 4.4 4.0 4.4 4.3  CL 98* 99* 98* 92* 95* 94*  CO2 26 26 24 27 25 28   GLUCOSE 108* 98 118* 91 87 103*  BUN <5* <5* 7 8 5* 10  CREATININE 0.63 0.68 0.60 0.90 0.85 0.75  CALCIUM 8.0* 7.9* 8.4* 9.3 8.9 9.2  MG 1.7 1.7 1.7  --   --   --   PHOS 2.3* 2.7 2.4*  --   --   --     Recent Labs  01/24/16 1128 01/28/16 0510 02/04/16 0624  AST 25 21 32  ALT 12* 9* 19  ALKPHOS 62 58 78  BILITOT 0.8 0.5 0.8  PROT 7.9 6.3* 7.9  ALBUMIN 3.5 2.4* 2.9*    Recent Labs  02/04/16 0624 02/06/16 1200 02/14/16 0615  WBC 10.1 11.4* 8.5  NEUTROABS 7.0* 8.4* 5.7  HGB 10.3* 10.7* 10.2*  HCT 30.3* 31.1* 28.9*    MCV 94.5 94.7 93.3  PLT 547* 602* 548*   Lab Results  Component Value Date   TSH 2.500 12/12/2014   No results found for: HGBA1C Lab Results  Component Value Date   CHOL 178 12/12/2014   HDL 84 12/12/2014   LDLCALC 80 12/12/2014   TRIG 72 12/12/2014   CHOLHDL 2.1 12/12/2014    Significant Diagnostic Results in last 30 days:  Dg Chest 2 View  Result Date: 01/24/2016 CLINICAL DATA:  Chest pain EXAM: CHEST  2 VIEW COMPARISON:  August 09, 2012 FINDINGS: There is no edema or consolidation. The heart size and pulmonary vascularity are normal. No adenopathy. There is mild aortic atherosclerosis. There is degenerative change in the thoracic spine. IMPRESSION: Aortic atherosclerosis.  No edema or consolidation. Electronically Signed   By: Lowella Grip III M.D.   On: 01/24/2016 11:32   Ct Angio Chest Pe W And/or Wo Contrast  Result Date: 01/24/2016 CLINICAL DATA:  Right-sided chest pain EXAM: CT ANGIOGRAPHY CHEST WITH CONTRAST TECHNIQUE: Multidetector CT imaging of the chest was performed using the standard protocol during bolus administration of intravenous contrast. Multiplanar CT image reconstructions and MIPs were obtained to evaluate the vascular anatomy. CONTRAST:  100 mL Isovue 370. COMPARISON:  04/22/2011. FINDINGS: Cardiovascular: Aortic calcifications are noted without aneurysmal dilatation or dissection. Mild coronary calcifications are seen. Pulmonary artery demonstrates a normal branching pattern without intraluminal filling defect to suggest pulmonary embolism. Mediastinum/Nodes: The thoracic inlet is within normal limits. No hilar or mediastinal adenopathy is identified. Lungs/Pleura: The lungs are well aerated bilaterally. Scattered small parenchymal nodules are noted particularly in the right lower lobe the largest of which measures 9 mm on image number 70 of series 6. When compared with the prior exam there relatively stable and likely benign in etiology. Upper Abdomen: There  is thickening at the level of the gastroesophageal junction which may be related to reflux. In the duodenum there is a an outpouching of contrast material and air identified with significant inflammatory changes likely representing a contained perforated ulcer this is incompletely evaluated on this exam. Musculoskeletal: Degenerative changes of the thoracic spine are noted. No acute bony abnormality is seen. Review of the MIP images confirms the above findings. IMPRESSION: No evidence of pulmonary emboli. Stable parenchymal nodules in the right lower lobe. Changes suggestive of a perforated duodenal ulcer. Electronically Signed   By: Inez Catalina M.D.   On: 01/24/2016 14:23   Ct Abdomen Pelvis W Contrast  Result Date: 01/24/2016 CLINICAL DATA:  Right side rib cage pain, nausea and vomiting EXAM: CT ABDOMEN AND PELVIS WITH CONTRAST TECHNIQUE: Multidetector CT imaging of the abdomen and pelvis was performed using the standard protocol following bolus administration of intravenous contrast. CONTRAST:  100 cc Isovue COMPARISON:  CT chest same day FINDINGS: Lower chest: No acute findings in lung bases Hepatobiliary: Enhanced liver is normal. The patient is status post cholecystectomy. CBD measures 7 mm in diameter. Pancreas: Enhanced pancreas is normal. Spleen: Enhanced spleen is normal. Adrenals/Urinary Tract: No adrenal gland mass. Enhanced kidneys are symmetrical in size. No hydronephrosis or hydroureter. The urinary bladder is unremarkable. Delayed renal images shows bilateral renal symmetrical excretion. Stomach/Bowel: There is thickening of proximal duodenal wall. On axial image 24 there is extraluminal contrast collection adjacent/ medial to duodenum and lateral to pancreatic head. On delayed images this collection measures 2.2 cm in diameter. There is increased small amount of extraluminal air on delay images. Findings are consistent with duodenal perforation probable perforated duodenal ulcer. Further  correlation with endoscopy is recommended. Sigmoid colon diverticula are noted. There is some thickening of sigmoid colon wall probable due to chronic diverticulosis. No definite evidence of acute diverticulitis. Vascular/Lymphatic: Atherosclerotic calcifications of abdominal aorta and iliac arteries are noted. Reproductive: The patient is status post hysterectomy. Other: No pelvic ascites. No pericecal inflammation. Normal appendix is noted in axial image 47. Musculoskeletal: No destructive bony lesions are noted. Sagittal images of the spine shows degenerative changes thoracolumbar spine. IMPRESSION: 1.  There is extraluminal contrast material just medial to duodenum. There is small amount of extraluminal air medial to duodenum. Findings highly suspicious for perforated duodenal ulcer. Clinical correlation is necessary. Correlation with endoscopy and/or surgical consult is recommended. 2. Status postcholecystectomy. 3. No hydronephrosis or hydroureter. 4. Status post hysterectomy. 5. Degenerative changes thoracic spine. 6. Sigmoid diverticulosis without definite evidence of acute diverticulitis. These results were called by telephone at the time of interpretation on 01/24/2016 at 2:21 pm to Dr. Conni Slipper , who verbally acknowledged these results. Electronically Signed   By: Lahoma Crocker M.D.   On: 01/24/2016 14:22   Mr 3d Recon At Scanner  Result Date: 01/27/2016 CLINICAL DATA:  Evaluate pancreatic mass. History of perforated duodenal ulcer and pancreatic mass seen and surgery. EXAM: MRI ABDOMEN WITHOUT AND WITH CONTRAST (INCLUDING MRCP) TECHNIQUE: Multiplanar multisequence MR imaging of the abdomen was performed both before and after the administration of intravenous contrast. Heavily T2-weighted images of the biliary and pancreatic ducts were obtained, and three-dimensional MRCP images were rendered by post processing. CONTRAST:  22m MULTIHANCE GADOBENATE DIMEGLUMINE 529 MG/ML IV SOLN COMPARISON:  CT scan  01/24/2016 FINDINGS: Lower chest: Small bilateral pleural effusions and overlying atelectasis. No pericardial effusion. Hepatobiliary: No focal hepatic lesions or intrahepatic biliary dilatation. Normal caliber and course of the common bile duct. No common bile duct stones, mass or obstruction. The gallbladder is surgically absent. Pancreas: Very limited examination due to motion artifact. Indistinct soft tissue fullness noted along the posterior aspect of the lower pancreatic head adjacent to the duodenum but very difficult to identified and measure a discrete mass. Normal caliber and course of the main pancreatic duct. The pancreatic body and tail show mild atrophy but no mass. Spleen:  Normal size.  No focal lesions. Adrenals/Urinary Tract:  No significant findings. Stomach/Bowel: The stomach is moderately distended. There is persistent inflammatory type changes involving the second portion duodenum along with a moderate-sized duodenum diverticulum. No new adrenal obstruction. Vascular/Lymphatic: The major vascular structures are patent. The aorta and branch vessels are unremarkable. There are small gastrohepatic ligament and periportal lymph nodes. Other:  Small amount of free abdominal fluid. Musculoskeletal: No significant findings. IMPRESSION: 1. Limited examination due to patient motion. 2. Ill-defined soft tissue fullness between the pancreatic head and duodenum likely reflecting the mass abuts identified at surgery. Dedicated pancreatic CT protocol may be helpful for further evaluation and surgical planning. 3. Small gastrohepatic ligament periportal and celiac axis lymph nodes are indeterminate. 4. Distended stomach but no obvious doing renal obstruction. Moderate-sized duodenum diverticulum. 5. No definite MR findings for metastatic disease. 6. Bilateral pleural effusions and mild to moderate free abdominal fluid. Electronically Signed   By: PMarijo SanesM.D.   On: 01/27/2016 11:55   Mr Abdomen Mrcp WMoise BoringContast  Result Date: 01/27/2016 CLINICAL DATA:  Evaluate pancreatic mass. History of perforated duodenal ulcer and pancreatic mass seen and surgery. EXAM: MRI ABDOMEN WITHOUT AND WITH CONTRAST (INCLUDING MRCP) TECHNIQUE: Multiplanar multisequence MR imaging of the abdomen was performed both before and after the administration of intravenous contrast. Heavily T2-weighted images of the biliary and pancreatic ducts were obtained, and three-dimensional MRCP images were rendered by post processing. CONTRAST:  114mMULTIHANCE GADOBENATE DIMEGLUMINE 529 MG/ML IV SOLN COMPARISON:  CT scan 01/24/2016 FINDINGS: Lower chest: Small bilateral pleural effusions and overlying atelectasis. No pericardial effusion. Hepatobiliary: No focal hepatic lesions or intrahepatic biliary dilatation. Normal caliber and course of the common bile duct. No common bile duct stones, mass or  obstruction. The gallbladder is surgically absent. Pancreas: Very limited examination due to motion artifact. Indistinct soft tissue fullness noted along the posterior aspect of the lower pancreatic head adjacent to the duodenum but very difficult to identified and measure a discrete mass. Normal caliber and course of the main pancreatic duct. The pancreatic body and tail show mild atrophy but no mass. Spleen:  Normal size.  No focal lesions. Adrenals/Urinary Tract:  No significant findings. Stomach/Bowel: The stomach is moderately distended. There is persistent inflammatory type changes involving the second portion duodenum along with a moderate-sized duodenum diverticulum. No new adrenal obstruction. Vascular/Lymphatic: The major vascular structures are patent. The aorta and branch vessels are unremarkable. There are small gastrohepatic ligament and periportal lymph nodes. Other:  Small amount of free abdominal fluid. Musculoskeletal: No significant findings. IMPRESSION: 1. Limited examination due to patient motion. 2. Ill-defined soft tissue fullness  between the pancreatic head and duodenum likely reflecting the mass abuts identified at surgery. Dedicated pancreatic CT protocol may be helpful for further evaluation and surgical planning. 3. Small gastrohepatic ligament periportal and celiac axis lymph nodes are indeterminate. 4. Distended stomach but no obvious doing renal obstruction. Moderate-sized duodenum diverticulum. 5. No definite MR findings for metastatic disease. 6. Bilateral pleural effusions and mild to moderate free abdominal fluid. Electronically Signed   By: Marijo Sanes M.D.   On: 01/27/2016 11:55    Assessment/Plan 1. Hyponatremia  Stable  Trending up  No need to continue IVF  Continue 1 liter/ day free water restriction  Recheck labs next week  2. Hypotension, unspecified hypotension type  Stable  Resolved  Do not resume antihypertensives at this point  Family/ staff Communication:   Total Time:  Documentation:  Face to Face:  Family/Phone:   Labs/tests ordered:  Cbc, met c, lipid panel  Medication list reviewed and assessed for continued appropriateness.  Vikki Ports, NP-C Geriatrics [facility] [phone] [place], [place] [address] Cell Phone (Mon-Fri 8am-5pm):  [phone] On Call:  [phone] & follow prompts after 5pm & weekends Office Phone:  [phone] Office Fax:  [phone]

## 2016-02-17 NOTE — Progress Notes (Signed)
Location:      Place of Service:  SNF (31) Provider:  Lorenso Quarry, NP-C  Fidel Levy, MD  Patient Care Team: Janeann Forehand., MD as PCP - General [facility])  Extended Emergency Contact Information Primary Emergency Contact: Hughes,Lyrika Souders Address: [address]          [place], [place] [address] [place] of [place] Home Phone: [phone] Mobile Phone: [phone] Relation: Son  Code Status:  full Goals of care: Advanced Directive information Advanced Directives 01/24/2016  Does Patient Have a Medical Advance Directive? No;Yes  Type of Advance Directive -  Does patient want to make changes to medical advance directive? Yes (Inpatient - patient requests chaplain consult to change a medical advance directive)  Copy of Healthcare Power of Attorney in Chart? No - copy requested     Chief Complaint  Patient presents with  . Acute Visit    HPI:  Pt is a 75 y.o. female seen today for an acute visit for hyponatremia and hypotension. 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 She was given Augmentin, which was completed yesterday. Pt denies pain or abdominal tenderness. Pt denies n/v/d/f/c/cp/sob/ha/abd pain/dizziness. Pt reports she was only taking the esacaloprim for difficulty sleeping. Pt is agreeable to GDR and discontinuing this med as a common side effect is hyponatremia. Educated pt on fluid restrictions, IVF, etc. Pt verbalized understanding of the education and is agreeable to short term IVF. Aside from weakness, pt reports she is feeling well. No other complaints. VSS.   Past Medical History:  Diagnosis Date  . Arthritis   . Asthma   . Chronic bronchitis (HCC)   . Collagen vascular disease (HCC)   . Depression   . Diverticulosis   . GERD (gastroesophageal reflux disease)   . High cholesterol   . Hypertension   . Seasonal allergies    Past Surgical History:  Procedure Laterality Date  . ABDOMINAL HYSTERECTOMY      . BREAST BIOPSY  1970's  . COLONOSCOPY  2005  . DG  BONE DENSITY ([facility] HX)  2010  . DIAGNOSTIC MAMMOGRAM  2015  . GALLBLADDER SURGERY    . pap smear  2014  . REPAIR OF PERFORATED ULCER N/A 01/24/2016   Procedure: Exploratory Laparotomy with biopsy of Pancreas;  Surgeon: Lattie Haw, MD;  Location: [facility] ORS;  Service: General;  Laterality: N/A;    Allergies  Allergen Reactions  . Cyclobenzaprine     Other reaction(s): Other (See Comments) Frequent urination  . Flucytosine Other (See Comments)    Sore mouth  . Fluticasone-Salmeterol Other (See Comments)    Other reaction(s): Other (See Comments) Sore mouth  . Naproxen Nausea Only    GI upset  . Other Other (See Comments)    Other reaction(s): Other (See Comments) Frequent urination Other reaction(s): Other (See Comments) Frequent urination Frequent urination    Allergies as of 02/05/2016      Reactions   Cyclobenzaprine    Other reaction(s): Other (See Comments) Frequent urination   Flucytosine Other (See Comments)   Sore mouth   Fluticasone-salmeterol    Other reaction(s): Other (See Comments) Sore mouth   Naproxen Nausea Only   GI upset   Other Other (See Comments)   Other reaction(s): Other (See Comments) Frequent urination Other reaction(s): Other (See Comments) Frequent urination Frequent urination      Medication List       Accurate as of 02/05/16 11:59 PM. Always use  your most recent med list.          ALEVE PM 220-25 MG Tabs Generic drug:  Naproxen Sod-Diphenhydramine Take by mouth.   amLODipine 5 MG tablet Commonly known as:  NORVASC TAKE 1 TABLET BY MOUTH ONCE DAILY.   bisacodyl 10 MG suppository Commonly known as:  DULCOLAX Place 1 suppository (10 mg total) rectally daily as needed for moderate constipation.   budesonide-formoterol 160-4.5 MCG/ACT inhaler Commonly known as:  SYMBICORT Inhale 1 puff into the lungs 2 (two) times daily.   CALCIUM 500/D 500-200 MG-UNIT  tablet Generic drug:  calcium-vitamin D Take 1 tablet by mouth daily.   escitalopram 20 MG tablet Commonly known as:  LEXAPRO Take 1 tablet daily   feeding supplement Liqd Take 1 Container by mouth 3 (three) times daily between meals.   fluticasone 50 MCG/ACT nasal spray Commonly known as:  FLONASE Place 2 sprays into the nose daily as needed.   folic acid 1 MG tablet Commonly known as:  FOLVITE Take 1 tablet by mouth daily.   HUMIRA PEN 40 MG/0.8ML Pnkt Generic drug:  Adalimumab Inject into the skin.   HYDROcodone-acetaminophen 5-325 MG tablet Commonly known as:  NORCO/VICODIN Take 1 tablet by mouth daily as needed.   losartan-hydrochlorothiazide 100-25 MG tablet Commonly known as:  HYZAAR TAKE 1 TABLET BY MOUTH ONCE DAILY.   Melatonin 10 MG Tabs Take 2 tablets by mouth at bedtime. Reported on 06/03/2015   montelukast 10 MG tablet Commonly known as:  SINGULAIR TAKE 1 TABLET BY MOUTH ONCE DAILY.   MULTI-VITAMINS Tabs Take 1 tablet by mouth daily.   Omega 3 1000 MG Caps Take 1,000 mg by mouth. omega-3 fatty acids-vitamin E (FISH OIL) 1,000 mg   oxyCODONE 5 MG immediate release tablet Commonly known as:  Oxy IR/ROXICODONE Take 1-2 tablets (5-10 mg total) by mouth every 4 (four) hours as needed for moderate pain or severe pain.   pantoprazole 40 MG tablet Commonly known as:  PROTONIX Take 1 tablet (40 mg total) by mouth daily.   polyethylene glycol packet Commonly known as:  MIRALAX / GLYCOLAX Take 17 g by mouth daily.   simvastatin 20 MG tablet Commonly known as:  ZOCOR Take 1 tablet every other night.       Review of Systems  Constitutional: Negative for activity change, appetite change, chills, diaphoresis and fever.  HENT: Negative for congestion, sneezing, sore throat, trouble swallowing and voice change.   Eyes: Negative.   Respiratory: Negative for apnea, cough, choking, chest tightness, shortness of breath and wheezing.   Cardiovascular: Negative  for chest pain, palpitations and leg swelling.  Gastrointestinal: Negative for abdominal distention, abdominal pain, constipation, diarrhea and nausea.  Endocrine: Negative.   Genitourinary: Negative for difficulty urinating, dysuria, frequency and urgency.  Musculoskeletal: Negative for back pain, gait problem and myalgias. Arthralgias: typical arthritis.  Skin: Negative for color change, pallor, rash and wound.  Allergic/Immunologic: Negative.   Neurological: Negative for dizziness, tremors, syncope, speech difficulty, weakness, light-headedness, numbness and headaches.  Psychiatric/Behavioral: Negative.   All other systems reviewed and are negative.   Immunization History  Administered Date(s) Administered  . Influenza, High Dose Seasonal PF 11/28/2014, 12/05/2015  . Pneumococcal Conjugate-13 01/09/2015  . Pneumococcal-Unspecified 11/28/2011   Pertinent  Health Maintenance Due  Topic Date Due  . COLONOSCOPY  02/24/2016 (Originally 11/19/2013)  . MAMMOGRAM  01/15/2018  . INFLUENZA VACCINE  Completed  . DEXA SCAN  Completed  . PNA vac Low Risk Adult  Completed  Fall Risk  12/05/2015 11/28/2014 11/28/2014  Falls in the past year? No No No   Functional Status Survey:    Vitals:   02/05/16 0153  BP: (!) 121/92  Pulse: 95  Resp: (!) 24  Temp: 98.1 F (36.7 C)  SpO2: 97%   There is no height or weight on file to calculate BMI. Physical Exam  Constitutional: She is oriented to person, place, and time. Vital signs are normal. She appears well-developed and well-nourished. She is active and cooperative. She does not appear ill. No distress.  HENT:  Head: Normocephalic and atraumatic.  Mouth/Throat: Uvula is midline, oropharynx is clear and moist and mucous membranes are normal. Mucous membranes are not pale, not dry and not cyanotic.  Eyes: Conjunctivae, EOM and lids are normal. Pupils are equal, round, and reactive to light.  Neck: Trachea normal, normal range of motion and  full passive range of motion without pain. Neck supple. No JVD present. No tracheal deviation, no edema and no erythema present. No thyromegaly present.  Cardiovascular: Normal rate, regular rhythm, normal heart sounds, intact distal pulses and normal pulses.  Exam reveals no gallop, no distant heart sounds and no friction rub.   No murmur heard. Pulmonary/Chest: Effort normal and breath sounds normal. No accessory muscle usage. No respiratory distress. She has no wheezes. She has no rales. She exhibits no tenderness.  Abdominal: Normal appearance and bowel sounds are normal. She exhibits no distension and no ascites. There is no tenderness.  Musculoskeletal: Normal range of motion. She exhibits no edema or tenderness.  Expected osteoarthritis, stiffness  Neurological: She is alert and oriented to person, place, and time. She has normal strength.  Skin: Skin is warm, dry and intact. No rash noted. She is not diaphoretic. No cyanosis or erythema. No pallor. Nails show no clubbing.  Psychiatric: She has a normal mood and affect. Her speech is normal and behavior is normal. Judgment and thought content normal. Cognition and memory are normal.  Nursing note and vitals reviewed.   Labs reviewed:  Recent Labs  01/29/16 0515 01/30/16 0546 01/31/16 0524 02/04/16 0624 02/06/16 0545 02/14/16 0615  NA 130* 130* 129* 128* 129* 131*  K 3.6 4.4 4.4 4.0 4.4 4.3  CL 98* 99* 98* 92* 95* 94*  CO2 _0 GLUCOSE 108* 98 118* 91 87 103*  BUN <5* <5* 7 8 5* 10  CREATININE 0.63 0.68 0.60 0.90 0.85 0.75  CALCIUM 8.0* 7.9* 8.4* 9.3 8.9 9.2  MG 1.7 1.7 1.7  --   --   --   PHOS 2.3* 2.7 2.4*  --   --   --     Recent Labs  01/24/16 1128 01/28/16 0510 02/04/16 0624  AST 25 21 32  ALT 12* 9* 19  ALKPHOS 62 58 78  BILITOT 0.8 0.5 0.8  PROT 7.9 6.3* 7.9  ALBUMIN 3.5 2.4* 2.9*    Recent Labs  02/04/16 0624 02/06/16 1200 02/14/16 0615  WBC 10.1 11.4* 8.5  NEUTROABS 7.0* 8.4* 5.7   HGB 10.3* 10.7* 10.2*  HCT 30.3* 31.1* 28.9*  MCV 94.5 94.7 93.3  PLT 547* 602* 548*   Lab Results  Component Value Date   TSH 2.500 12/12/2014   No results found for: HGBA1C Lab Results  Component Value Date   CHOL 178 12/12/2014   HDL 84 12/12/2014   LDLCALC 80 12/12/2014   TRIG 72 12/12/2014   CHOLHDL 2.1 12/12/2014    Significant Diagnostic Results in  last 30 days:  Dg Chest 2 View  Result Date: 01/24/2016 CLINICAL DATA:  Chest pain EXAM: CHEST  2 VIEW COMPARISON:  August 09, 2012 FINDINGS: There is no edema or consolidation. The heart size and pulmonary vascularity are normal. No adenopathy. There is mild aortic atherosclerosis. There is degenerative change in the thoracic spine. IMPRESSION: Aortic atherosclerosis.  No edema or consolidation. Electronically Signed   By: Lowella Grip III M.D.   On: 01/24/2016 11:32   Ct Angio Chest Pe W And/or Wo Contrast  Result Date: 01/24/2016 CLINICAL DATA:  Right-sided chest pain EXAM: CT ANGIOGRAPHY CHEST WITH CONTRAST TECHNIQUE: Multidetector CT imaging of the chest was performed using the standard protocol during bolus administration of intravenous contrast. Multiplanar CT image reconstructions and MIPs were obtained to evaluate the vascular anatomy. CONTRAST:  100 mL Isovue 370. COMPARISON:  04/22/2011. FINDINGS: Cardiovascular: Aortic calcifications are noted without aneurysmal dilatation or dissection. Mild coronary calcifications are seen. Pulmonary artery demonstrates a normal branching pattern without intraluminal filling defect to suggest pulmonary embolism. Mediastinum/Nodes: The thoracic inlet is within normal limits. No hilar or mediastinal adenopathy is identified. Lungs/Pleura: The lungs are well aerated bilaterally. Scattered small parenchymal nodules are noted particularly in the right lower lobe the largest of which measures 9 mm on image number 70 of series 6. When compared with the prior exam there relatively stable and  likely benign in etiology. Upper Abdomen: There is thickening at the level of the gastroesophageal junction which may be related to reflux. In the duodenum there is a an outpouching of contrast material and air identified with significant inflammatory changes likely representing a contained perforated ulcer this is incompletely evaluated on this exam. Musculoskeletal: Degenerative changes of the thoracic spine are noted. No acute bony abnormality is seen. Review of the MIP images confirms the above findings. IMPRESSION: No evidence of pulmonary emboli. Stable parenchymal nodules in the right lower lobe. Changes suggestive of a perforated duodenal ulcer. Electronically Signed   By: Inez Catalina M.D.   On: 01/24/2016 14:23   Ct Abdomen Pelvis W Contrast  Result Date: 01/24/2016 CLINICAL DATA:  Right side rib cage pain, nausea and vomiting EXAM: CT ABDOMEN AND PELVIS WITH CONTRAST TECHNIQUE: Multidetector CT imaging of the abdomen and pelvis was performed using the standard protocol following bolus administration of intravenous contrast. CONTRAST:  100 cc Isovue COMPARISON:  CT chest same day FINDINGS: Lower chest: No acute findings in lung bases Hepatobiliary: Enhanced liver is normal. The patient is status post cholecystectomy. CBD measures 7 mm in diameter. Pancreas: Enhanced pancreas is normal. Spleen: Enhanced spleen is normal. Adrenals/Urinary Tract: No adrenal gland mass. Enhanced kidneys are symmetrical in size. No hydronephrosis or hydroureter. The urinary bladder is unremarkable. Delayed renal images shows bilateral renal symmetrical excretion. Stomach/Bowel: There is thickening of proximal duodenal wall. On axial image 24 there is extraluminal contrast collection adjacent/ medial to duodenum and lateral to pancreatic head. On delayed images this collection measures 2.2 cm in diameter. There is increased small amount of extraluminal air on delay images. Findings are consistent with duodenal perforation  probable perforated duodenal ulcer. Further correlation with endoscopy is recommended. Sigmoid colon diverticula are noted. There is some thickening of sigmoid colon wall probable due to chronic diverticulosis. No definite evidence of acute diverticulitis. Vascular/Lymphatic: Atherosclerotic calcifications of abdominal aorta and iliac arteries are noted. Reproductive: The patient is status post hysterectomy. Other: No pelvic ascites. No pericecal inflammation. Normal appendix is noted in axial image 47. Musculoskeletal: No destructive bony  lesions are noted. Sagittal images of the spine shows degenerative changes thoracolumbar spine. IMPRESSION: 1. There is extraluminal contrast material just medial to duodenum. There is small amount of extraluminal air medial to duodenum. Findings highly suspicious for perforated duodenal ulcer. Clinical correlation is necessary. Correlation with endoscopy and/or surgical consult is recommended. 2. Status postcholecystectomy. 3. No hydronephrosis or hydroureter. 4. Status post hysterectomy. 5. Degenerative changes thoracic spine. 6. Sigmoid diverticulosis without definite evidence of acute diverticulitis. These results were called by telephone at the time of interpretation on 01/24/2016 at 2:21 pm to Dr. Conni Slipper , who verbally acknowledged these results. Electronically Signed   By: Lahoma Crocker M.D.   On: 01/24/2016 14:22   Mr 3d Recon At Scanner  Result Date: 01/27/2016 CLINICAL DATA:  Evaluate pancreatic mass. History of perforated duodenal ulcer and pancreatic mass seen and surgery. EXAM: MRI ABDOMEN WITHOUT AND WITH CONTRAST (INCLUDING MRCP) TECHNIQUE: Multiplanar multisequence MR imaging of the abdomen was performed both before and after the administration of intravenous contrast. Heavily T2-weighted images of the biliary and pancreatic ducts were obtained, and three-dimensional MRCP images were rendered by post processing. CONTRAST:  23m MULTIHANCE GADOBENATE  DIMEGLUMINE 529 MG/ML IV SOLN COMPARISON:  CT scan 01/24/2016 FINDINGS: Lower chest: Small bilateral pleural effusions and overlying atelectasis. No pericardial effusion. Hepatobiliary: No focal hepatic lesions or intrahepatic biliary dilatation. Normal caliber and course of the common bile duct. No common bile duct stones, mass or obstruction. The gallbladder is surgically absent. Pancreas: Very limited examination due to motion artifact. Indistinct soft tissue fullness noted along the posterior aspect of the lower pancreatic head adjacent to the duodenum but very difficult to identified and measure a discrete mass. Normal caliber and course of the main pancreatic duct. The pancreatic body and tail show mild atrophy but no mass. Spleen:  Normal size.  No focal lesions. Adrenals/Urinary Tract:  No significant findings. Stomach/Bowel: The stomach is moderately distended. There is persistent inflammatory type changes involving the second portion duodenum along with a moderate-sized duodenum diverticulum. No new adrenal obstruction. Vascular/Lymphatic: The major vascular structures are patent. The aorta and branch vessels are unremarkable. There are small gastrohepatic ligament and periportal lymph nodes. Other:  Small amount of free abdominal fluid. Musculoskeletal: No significant findings. IMPRESSION: 1. Limited examination due to patient motion. 2. Ill-defined soft tissue fullness between the pancreatic head and duodenum likely reflecting the mass abuts identified at surgery. Dedicated pancreatic CT protocol may be helpful for further evaluation and surgical planning. 3. Small gastrohepatic ligament periportal and celiac axis lymph nodes are indeterminate. 4. Distended stomach but no obvious doing renal obstruction. Moderate-sized duodenum diverticulum. 5. No definite MR findings for metastatic disease. 6. Bilateral pleural effusions and mild to moderate free abdominal fluid. Electronically Signed   By: PMarijo SanesM.D.   On: 01/27/2016 11:55   Mr Abdomen Mrcp WMoise BoringContast  Result Date: 01/27/2016 CLINICAL DATA:  Evaluate pancreatic mass. History of perforated duodenal ulcer and pancreatic mass seen and surgery. EXAM: MRI ABDOMEN WITHOUT AND WITH CONTRAST (INCLUDING MRCP) TECHNIQUE: Multiplanar multisequence MR imaging of the abdomen was performed both before and after the administration of intravenous contrast. Heavily T2-weighted images of the biliary and pancreatic ducts were obtained, and three-dimensional MRCP images were rendered by post processing. CONTRAST:  155mMULTIHANCE GADOBENATE DIMEGLUMINE 529 MG/ML IV SOLN COMPARISON:  CT scan 01/24/2016 FINDINGS: Lower chest: Small bilateral pleural effusions and overlying atelectasis. No pericardial effusion. Hepatobiliary: No focal hepatic lesions or intrahepatic biliary dilatation. Normal  caliber and course of the common bile duct. No common bile duct stones, mass or obstruction. The gallbladder is surgically absent. Pancreas: Very limited examination due to motion artifact. Indistinct soft tissue fullness noted along the posterior aspect of the lower pancreatic head adjacent to the duodenum but very difficult to identified and measure a discrete mass. Normal caliber and course of the main pancreatic duct. The pancreatic body and tail show mild atrophy but no mass. Spleen:  Normal size.  No focal lesions. Adrenals/Urinary Tract:  No significant findings. Stomach/Bowel: The stomach is moderately distended. There is persistent inflammatory type changes involving the second portion duodenum along with a moderate-sized duodenum diverticulum. No new adrenal obstruction. Vascular/Lymphatic: The major vascular structures are patent. The aorta and branch vessels are unremarkable. There are small gastrohepatic ligament and periportal lymph nodes. Other:  Small amount of free abdominal fluid. Musculoskeletal: No significant findings. IMPRESSION: 1. Limited examination  due to patient motion. 2. Ill-defined soft tissue fullness between the pancreatic head and duodenum likely reflecting the mass abuts identified at surgery. Dedicated pancreatic CT protocol may be helpful for further evaluation and surgical planning. 3. Small gastrohepatic ligament periportal and celiac axis lymph nodes are indeterminate. 4. Distended stomach but no obvious doing renal obstruction. Moderate-sized duodenum diverticulum. 5. No definite MR findings for metastatic disease. 6. Bilateral pleural effusions and mild to moderate free abdominal fluid. Electronically Signed   By: Marijo Sanes M.D.   On: 01/27/2016 11:55    Assessment/Plan 1. Hyponatremia  D/C Hyzaar  Wean down, then D/C escitalopram (pt reports she was taking it for inability to sleep)  Insert peripheral IV  Begin 0.9% NS IV fluids at 50 mL/ hr x 48 hours  Then recheck Na+ level  May continue IVF for ongoing hyponatremia  2. Hypotension, unspecified hypotension type  D/C amlodipine  D/C Hyzaar  IV fluids  Family/ staff Communication:   Total Time:  Documentation:  Face to Face:  Family/Phone:   Labs/tests ordered:  Cbc, met b  Medication list reviewed and assessed for continued appropriateness.  Vikki Ports, NP-C Geriatrics [facility] [phone], [place] [address] Cell Phone (Mon-Fri 8am-5pm):  [phone] On Call:  [phone] & follow prompts after 5pm & weekends Office Phone:  [phone] Office Fax:  [phone]

## 2016-02-18 ENCOUNTER — Non-Acute Institutional Stay (SKILLED_NURSING_FACILITY): Payer: Medicare Other | Admitting: Gerontology

## 2016-02-18 DIAGNOSIS — I959 Hypotension, unspecified: Secondary | ICD-10-CM

## 2016-02-18 DIAGNOSIS — E871 Hypo-osmolality and hyponatremia: Secondary | ICD-10-CM

## 2016-02-18 DIAGNOSIS — Z9889 Other specified postprocedural states: Secondary | ICD-10-CM

## 2016-02-18 LAB — COMPREHENSIVE METABOLIC PANEL
ALK PHOS: 65 U/L (ref 38–126)
ALT: 13 U/L — ABNORMAL LOW (ref 14–54)
ANION GAP: 11 (ref 5–15)
AST: 26 U/L (ref 15–41)
Albumin: 3.1 g/dL — ABNORMAL LOW (ref 3.5–5.0)
BUN: 17 mg/dL (ref 6–20)
CALCIUM: 9.3 mg/dL (ref 8.9–10.3)
CHLORIDE: 92 mmol/L — AB (ref 101–111)
CO2: 27 mmol/L (ref 22–32)
Creatinine, Ser: 1.07 mg/dL — ABNORMAL HIGH (ref 0.44–1.00)
GFR calc non Af Amer: 50 mL/min — ABNORMAL LOW (ref >60)
GFR, EST AFRICAN AMERICAN: 58 mL/min — AB (ref >60)
Glucose, Bld: 96 mg/dL (ref 65–99)
Potassium: 3.9 mmol/L (ref 3.5–5.1)
SODIUM: 130 mmol/L — AB (ref 135–145)
Total Bilirubin: 0.8 mg/dL (ref 0.3–1.2)
Total Protein: 7.8 g/dL (ref 6.5–8.1)

## 2016-02-18 LAB — CBC WITH DIFFERENTIAL/PLATELET
BASOS ABS: 0.1 10*3/uL (ref 0–0.1)
Basophils Relative: 1 %
Eosinophils Absolute: 0.3 10*3/uL (ref 0–0.7)
Eosinophils Relative: 5 %
HEMATOCRIT: 30.8 % — AB (ref 35.0–47.0)
Hemoglobin: 10.7 g/dL — ABNORMAL LOW (ref 12.0–16.0)
LYMPHS PCT: 28 %
Lymphs Abs: 2.1 10*3/uL (ref 1.0–3.6)
MCH: 32.6 pg (ref 26.0–34.0)
MCHC: 34.8 g/dL (ref 32.0–36.0)
MCV: 93.8 fL (ref 80.0–100.0)
Monocytes Absolute: 0.8 10*3/uL (ref 0.2–0.9)
Monocytes Relative: 11 %
NEUTROS ABS: 4.3 10*3/uL (ref 1.4–6.5)
NEUTROS PCT: 55 %
Platelets: 502 10*3/uL — ABNORMAL HIGH (ref 150–440)
RBC: 3.29 MIL/uL — AB (ref 3.80–5.20)
RDW: 13.2 % (ref 11.5–14.5)
WBC: 7.6 10*3/uL (ref 3.6–11.0)

## 2016-02-18 LAB — LIPID PANEL
CHOL/HDL RATIO: 3 ratio
Cholesterol: 133 mg/dL (ref 0–200)
HDL: 45 mg/dL (ref >40)
LDL CALC: 75 mg/dL (ref 0–99)
Triglycerides: 63 mg/dL (ref <150)
VLDL: 13 mg/dL (ref 0–40)

## 2016-02-18 NOTE — Progress Notes (Addendum)
Location:      Place of Service:  SNF (31) Provider:  Toni Arthurs, NP-C  Dicky Doe, MD  Patient Care Team: Arlis Porta., MD as PCP - General [facility])  Extended Emergency Contact Information Primary Emergency Contact: Hughes,Myrna Vonseggern Address: [address]          [place], [place] [address] Johnnette Litter of [place] Phone: [phone] Mobile Phone: [phone] Relation: Son  Code Status:  full Goals of care: Advanced Directive information Advanced Directives 01/24/2016  Does Patient Have a Medical Advance Directive? No;Yes  Type of Advance Directive -  Does patient want to make changes to medical advance directive? Yes (Inpatient - patient requests chaplain consult to change a medical advance directive)  Copy of [place] in Chart? No - copy requested     Chief Complaint  Patient presents with  . Discharge Note    HPI:  Pt is a 75 y.o. female seen today for discharge evaluation visit for hyponatremia, hypotension and s/p exploratory laparotomy. Pt denies pain or abdominal tenderness. Pt denies n/v/d/f/c/cp/sob/ha/abd pain/dizziness. Pt reports she was only taking the esacaloprim for difficulty sleeping. Pt is agreeable to GDR and discontinuing this med as a common side effect is hyponatremia. GDR is now complete and pt is no longer taking this med. Educated pt on fluid restrictions, IVF, etc. Pt verbalized understanding of the education. Aside from weakness, pt reports she is feeling well. Will continue to monitor labs d/t persistent hyponatremia. Statin discontinued to minimize strain on liver and lipid panel was unremarkable. Hypotension resolved. Pt feels she is stable for discharge and is excited to go home. No other complaints. VSS.   Past Medical History:  Diagnosis Date  . Arthritis   . Asthma   . Chronic bronchitis ([place])   . Collagen vascular disease ([place])   . Depression   . Diverticulosis   . GERD (gastroesophageal  reflux disease)   . High cholesterol   . Hypertension   . Seasonal allergies    Past Surgical History:  Procedure Laterality Date  . ABDOMINAL HYSTERECTOMY    . BREAST BIOPSY  1970's  . COLONOSCOPY  2005  . DG  BONE DENSITY ([place] [facility])  2010  . DIAGNOSTIC MAMMOGRAM  2015  . GALLBLADDER SURGERY    . pap smear  2014  . REPAIR OF PERFORATED ULCER N/A 01/24/2016   Procedure: Exploratory Laparotomy with biopsy of Pancreas;  Surgeon: Florene Glen, MD;  Location: [facility] ORS;  Service: General;  Laterality: N/A;    Allergies  Allergen Reactions  . Cyclobenzaprine     Other reaction(s): Other (See Comments) Frequent urination  . Flucytosine Other (See Comments)    Sore mouth  . Fluticasone-Salmeterol Other (See Comments)    Other reaction(s): Other (See Comments) Sore mouth  . Naproxen Nausea Only    GI upset  . Other Other (See Comments)    Other reaction(s): Other (See Comments) Frequent urination Other reaction(s): Other (See Comments) Frequent urination Frequent urination    Allergies as of 02/18/2016      Reactions   Cyclobenzaprine    Other reaction(s): Other (See Comments) Frequent urination   Flucytosine Other (See Comments)   Sore mouth   Fluticasone-salmeterol Other (See Comments)   Other reaction(s): Other (See Comments) Sore mouth   Naproxen Nausea Only   GI upset   Other Other (See Comments)   Other reaction(s): Other (See Comments) Frequent urination Other reaction(s): Other (See Comments) Frequent urination Frequent  urination      Medication List       Accurate as of 02/18/16  9:22 PM. Always use your most recent med list.          ALEVE PM 220-25 MG Tabs Generic drug:  Naproxen Sod-Diphenhydramine Take by mouth.   amLODipine 5 MG tablet Commonly known as:  NORVASC TAKE 1 TABLET BY MOUTH ONCE DAILY.   bisacodyl 10 MG suppository Commonly known as:  DULCOLAX Place 1 suppository (10 mg total) rectally daily as needed for moderate  constipation.   budesonide-formoterol 160-4.5 MCG/ACT inhaler Commonly known as:  SYMBICORT Inhale 1 puff into the lungs 2 (two) times daily.   CALCIUM 500/D 500-200 MG-UNIT tablet Generic drug:  calcium-vitamin D Take 1 tablet by mouth daily.   escitalopram 20 MG tablet Commonly known as:  LEXAPRO Take 1 tablet daily   feeding supplement Liqd Take 1 Container by mouth 3 (three) times daily between meals.   fluticasone 50 MCG/ACT nasal spray Commonly known as:  FLONASE Place 2 sprays into the nose daily as needed.   folic acid 1 MG tablet Commonly known as:  FOLVITE Take 1 tablet by mouth daily.   HUMIRA PEN 40 MG/0.8ML Pnkt Generic drug:  Adalimumab Inject into the skin.   HYDROcodone-acetaminophen 5-325 MG tablet Commonly known as:  NORCO/VICODIN Take 1 tablet by mouth daily as needed.   losartan-hydrochlorothiazide 100-25 MG tablet Commonly known as:  HYZAAR TAKE 1 TABLET BY MOUTH ONCE DAILY.   Melatonin 10 MG Tabs Take 2 tablets by mouth at bedtime. Reported on 06/03/2015   montelukast 10 MG tablet Commonly known as:  SINGULAIR TAKE 1 TABLET BY MOUTH ONCE DAILY.   MULTI-VITAMINS Tabs Take 1 tablet by mouth daily.   Omega 3 1000 MG Caps Take 1,000 mg by mouth. omega-3 fatty acids-vitamin E (FISH OIL) 1,000 mg   oxyCODONE 5 MG immediate release tablet Commonly known as:  Oxy IR/ROXICODONE Take 1-2 tablets (5-10 mg total) by mouth every 4 (four) hours as needed for moderate pain or severe pain.   pantoprazole 40 MG tablet Commonly known as:  PROTONIX Take 1 tablet (40 mg total) by mouth daily.   polyethylene glycol packet Commonly known as:  MIRALAX / GLYCOLAX Take 17 g by mouth daily.   simvastatin 20 MG tablet Commonly known as:  ZOCOR Take 1 tablet every other night.       Review of Systems  Constitutional: Negative for activity change, appetite change, chills, diaphoresis and fever.  HENT: Negative for congestion, sneezing, sore throat,  trouble swallowing and voice change.   Eyes: Negative.   Respiratory: Negative for apnea, cough, choking, chest tightness, shortness of breath and wheezing.   Cardiovascular: Negative for chest pain, palpitations and leg swelling.  Gastrointestinal: Negative for abdominal distention, abdominal pain, constipation, diarrhea and nausea.  Endocrine: Negative.   Genitourinary: Negative for difficulty urinating, dysuria, frequency and urgency.  Musculoskeletal: Negative for back pain, gait problem and myalgias. Arthralgias: typical arthritis.  Skin: Negative for color change, pallor, rash and wound.  Allergic/Immunologic: Negative.   Neurological: Negative for dizziness, tremors, syncope, speech difficulty, weakness, light-headedness, numbness and headaches.  Psychiatric/Behavioral: Negative.   All other systems reviewed and are negative.   Immunization History  Administered Date(s) Administered  . Influenza, High Dose Seasonal PF 11/28/2014, 12/05/2015  . Pneumococcal Conjugate-13 01/09/2015  . Pneumococcal-Unspecified 11/28/2011   Pertinent  Health Maintenance Due  Topic Date Due  . COLONOSCOPY  02/24/2016 (Originally 11/19/2013)  . MAMMOGRAM  01/15/2018  .  INFLUENZA VACCINE  Completed  . DEXA SCAN  Completed  . PNA vac Low Risk Adult  Completed   Fall Risk  12/05/2015 11/28/2014 11/28/2014  Falls in the past year? No No No   Functional Status Survey:    Vitals:   02/18/16 1800  BP: 114/63  Pulse: 62  Resp: 20  Temp: 98.4 F (36.9 C)  SpO2: 99%   There is no height or weight on file to calculate BMI. Physical Exam  Constitutional: She is oriented to person, place, and time. Vital signs are normal. She appears well-developed and well-nourished. She is active and cooperative. She does not appear ill. No distress.  HENT:  Head: Normocephalic and atraumatic.  Mouth/Throat: Uvula is midline, oropharynx is clear and moist and mucous membranes are normal. Mucous membranes are not  pale, not dry and not cyanotic.  Eyes: Conjunctivae, EOM and lids are normal. Pupils are equal, round, and reactive to light.  Neck: Trachea normal, normal range of motion and full passive range of motion without pain. Neck supple. No JVD present. No tracheal deviation, no edema and no erythema present. No thyromegaly present.  Cardiovascular: Normal rate, regular rhythm, normal heart sounds, intact distal pulses and normal pulses.  Exam reveals no gallop, no distant heart sounds and no friction rub.   No murmur heard. Pulmonary/Chest: Effort normal and breath sounds normal. No accessory muscle usage. No respiratory distress. She has no wheezes. She has no rales. She exhibits no tenderness.  Abdominal: Normal appearance and bowel sounds are normal. She exhibits no distension and no ascites. There is no tenderness.  Musculoskeletal: Normal range of motion. She exhibits no edema or tenderness.  Expected osteoarthritis, stiffness  Neurological: She is alert and oriented to person, place, and time. She has normal strength.  Skin: Skin is warm, dry and intact. No rash noted. She is not diaphoretic. No cyanosis or erythema. No pallor. Nails show no clubbing.  Psychiatric: She has a normal mood and affect. Her speech is normal and behavior is normal. Judgment and thought content normal. Cognition and memory are normal.  Nursing note and vitals reviewed.   Labs reviewed:  Recent Labs  01/29/16 0515 01/30/16 0546 01/31/16 0524  02/06/16 0545 02/14/16 0615 02/18/16 0610  NA 130* 130* 129*  < > 129* 131* 130*  K 3.6 4.4 4.4  < > 4.4 4.3 3.9  CL 98* 99* 98*  < > 95* 94* 92*  CO2 26 26 24   < > 25 28 27   GLUCOSE 108* 98 118*  < > 87 103* 96  BUN <5* <5* 7  < > 5* 10 17  CREATININE 0.63 0.68 0.60  < > 0.85 0.75 1.07*  CALCIUM 8.0* 7.9* 8.4*  < > 8.9 9.2 9.3  MG 1.7 1.7 1.7  --   --   --   --   PHOS 2.3* 2.7 2.4*  --   --   --   --   < > = values in this interval not displayed.  Recent Labs   01/28/16 0510 02/04/16 0624 02/18/16 0610  AST 21 32 26  ALT 9* 19 13*  ALKPHOS 58 78 65  BILITOT 0.5 0.8 0.8  PROT 6.3* 7.9 7.8  ALBUMIN 2.4* 2.9* 3.1*    Recent Labs  02/06/16 1200 02/14/16 0615 02/18/16 0610  WBC 11.4* 8.5 7.6  NEUTROABS 8.4* 5.7 4.3  HGB 10.7* 10.2* 10.7*  HCT 31.1* 28.9* 30.8*  MCV 94.7 93.3 93.8  PLT 602* 548* 502*  Lab Results  Component Value Date   TSH 2.500 12/12/2014   No results found for: HGBA1C Lab Results  Component Value Date   CHOL 133 02/18/2016   HDL 45 02/18/2016   LDLCALC 75 02/18/2016   TRIG 63 02/18/2016   CHOLHDL 3.0 02/18/2016    Significant Diagnostic Results in last 30 days:  Dg Chest 2 View  Result Date: 01/24/2016 CLINICAL DATA:  Chest pain EXAM: CHEST  2 VIEW COMPARISON:  August 09, 2012 FINDINGS: There is no edema or consolidation. The heart size and pulmonary vascularity are normal. No adenopathy. There is mild aortic atherosclerosis. There is degenerative change in the thoracic spine. IMPRESSION: Aortic atherosclerosis.  No edema or consolidation. Electronically Signed   By: Lowella Grip III M.D.   On: 01/24/2016 11:32   Ct Angio Chest Pe W And/or Wo Contrast  Result Date: 01/24/2016 CLINICAL DATA:  Right-sided chest pain EXAM: CT ANGIOGRAPHY CHEST WITH CONTRAST TECHNIQUE: Multidetector CT imaging of the chest was performed using the standard protocol during bolus administration of intravenous contrast. Multiplanar CT image reconstructions and MIPs were obtained to evaluate the vascular anatomy. CONTRAST:  100 mL Isovue 370. COMPARISON:  04/22/2011. FINDINGS: Cardiovascular: Aortic calcifications are noted without aneurysmal dilatation or dissection. Mild coronary calcifications are seen. Pulmonary artery demonstrates a normal branching pattern without intraluminal filling defect to suggest pulmonary embolism. Mediastinum/Nodes: The thoracic inlet is within normal limits. No hilar or mediastinal adenopathy is  identified. Lungs/Pleura: The lungs are well aerated bilaterally. Scattered small parenchymal nodules are noted particularly in the right lower lobe the largest of which measures 9 mm on image number 70 of series 6. When compared with the prior exam there relatively stable and likely benign in etiology. Upper Abdomen: There is thickening at the level of the gastroesophageal junction which may be related to reflux. In the duodenum there is a an outpouching of contrast material and air identified with significant inflammatory changes likely representing a contained perforated ulcer this is incompletely evaluated on this exam. Musculoskeletal: Degenerative changes of the thoracic spine are noted. No acute bony abnormality is seen. Review of the MIP images confirms the above findings. IMPRESSION: No evidence of pulmonary emboli. Stable parenchymal nodules in the right lower lobe. Changes suggestive of a perforated duodenal ulcer. Electronically Signed   By: Inez Catalina M.D.   On: 01/24/2016 14:23   Ct Abdomen Pelvis W Contrast  Result Date: 01/24/2016 CLINICAL DATA:  Right side rib cage pain, nausea and vomiting EXAM: CT ABDOMEN AND PELVIS WITH CONTRAST TECHNIQUE: Multidetector CT imaging of the abdomen and pelvis was performed using the standard protocol following bolus administration of intravenous contrast. CONTRAST:  100 cc Isovue COMPARISON:  CT chest same day FINDINGS: Lower chest: No acute findings in lung bases Hepatobiliary: Enhanced liver is normal. The patient is status post cholecystectomy. CBD measures 7 mm in diameter. Pancreas: Enhanced pancreas is normal. Spleen: Enhanced spleen is normal. Adrenals/Urinary Tract: No adrenal gland mass. Enhanced kidneys are symmetrical in size. No hydronephrosis or hydroureter. The urinary bladder is unremarkable. Delayed renal images shows bilateral renal symmetrical excretion. Stomach/Bowel: There is thickening of proximal duodenal wall. On axial image 24 there is  extraluminal contrast collection adjacent/ medial to duodenum and lateral to pancreatic head. On delayed images this collection measures 2.2 cm in diameter. There is increased small amount of extraluminal air on delay images. Findings are consistent with duodenal perforation probable perforated duodenal ulcer. Further correlation with endoscopy is recommended. Sigmoid colon diverticula are noted.  There is some thickening of sigmoid colon wall probable due to chronic diverticulosis. No definite evidence of acute diverticulitis. Vascular/Lymphatic: Atherosclerotic calcifications of abdominal aorta and iliac arteries are noted. Reproductive: The patient is status post hysterectomy. Other: No pelvic ascites. No pericecal inflammation. Normal appendix is noted in axial image 47. Musculoskeletal: No destructive bony lesions are noted. Sagittal images of the spine shows degenerative changes thoracolumbar spine. IMPRESSION: 1. There is extraluminal contrast material just medial to duodenum. There is small amount of extraluminal air medial to duodenum. Findings highly suspicious for perforated duodenal ulcer. Clinical correlation is necessary. Correlation with endoscopy and/or surgical consult is recommended. 2. Status postcholecystectomy. 3. No hydronephrosis or hydroureter. 4. Status post hysterectomy. 5. Degenerative changes thoracic spine. 6. Sigmoid diverticulosis without definite evidence of acute diverticulitis. These results were called by telephone at the time of interpretation on 01/24/2016 at 2:21 pm to Dr. Conni Slipper , who verbally acknowledged these results. Electronically Signed   By: Lahoma Crocker M.D.   On: 01/24/2016 14:22   Mr 3d Recon At Scanner  Result Date: 01/27/2016 CLINICAL DATA:  Evaluate pancreatic mass. History of perforated duodenal ulcer and pancreatic mass seen and surgery. EXAM: MRI ABDOMEN WITHOUT AND WITH CONTRAST (INCLUDING MRCP) TECHNIQUE: Multiplanar multisequence MR imaging of the  abdomen was performed both before and after the administration of intravenous contrast. Heavily T2-weighted images of the biliary and pancreatic ducts were obtained, and three-dimensional MRCP images were rendered by post processing. CONTRAST:  6mL MULTIHANCE GADOBENATE DIMEGLUMINE 529 MG/ML IV SOLN COMPARISON:  CT scan 01/24/2016 FINDINGS: Lower chest: Small bilateral pleural effusions and overlying atelectasis. No pericardial effusion. Hepatobiliary: No focal hepatic lesions or intrahepatic biliary dilatation. Normal caliber and course of the common bile duct. No common bile duct stones, mass or obstruction. The gallbladder is surgically absent. Pancreas: Very limited examination due to motion artifact. Indistinct soft tissue fullness noted along the posterior aspect of the lower pancreatic head adjacent to the duodenum but very difficult to identified and measure a discrete mass. Normal caliber and course of the main pancreatic duct. The pancreatic body and tail show mild atrophy but no mass. Spleen:  Normal size.  No focal lesions. Adrenals/Urinary Tract:  No significant findings. Stomach/Bowel: The stomach is moderately distended. There is persistent inflammatory type changes involving the second portion duodenum along with a moderate-sized duodenum diverticulum. No new adrenal obstruction. Vascular/Lymphatic: The major vascular structures are patent. The aorta and branch vessels are unremarkable. There are small gastrohepatic ligament and periportal lymph nodes. Other:  Small amount of free abdominal fluid. Musculoskeletal: No significant findings. IMPRESSION: 1. Limited examination due to patient motion. 2. Ill-defined soft tissue fullness between the pancreatic head and duodenum likely reflecting the mass abuts identified at surgery. Dedicated pancreatic CT protocol may be helpful for further evaluation and surgical planning. 3. Small gastrohepatic ligament periportal and celiac axis lymph nodes are  indeterminate. 4. Distended stomach but no obvious doing renal obstruction. Moderate-sized duodenum diverticulum. 5. No definite MR findings for metastatic disease. 6. Bilateral pleural effusions and mild to moderate free abdominal fluid. Electronically Signed   By: Marijo Sanes M.D.   On: 01/27/2016 11:55   Mr Abdomen Mrcp Moise Boring Contast  Result Date: 01/27/2016 CLINICAL DATA:  Evaluate pancreatic mass. History of perforated duodenal ulcer and pancreatic mass seen and surgery. EXAM: MRI ABDOMEN WITHOUT AND WITH CONTRAST (INCLUDING MRCP) TECHNIQUE: Multiplanar multisequence MR imaging of the abdomen was performed both before and after the administration of intravenous contrast. Heavily T2-weighted  images of the biliary and pancreatic ducts were obtained, and three-dimensional MRCP images were rendered by post processing. CONTRAST:  9mL MULTIHANCE GADOBENATE DIMEGLUMINE 529 MG/ML IV SOLN COMPARISON:  CT scan 01/24/2016 FINDINGS: Lower chest: Small bilateral pleural effusions and overlying atelectasis. No pericardial effusion. Hepatobiliary: No focal hepatic lesions or intrahepatic biliary dilatation. Normal caliber and course of the common bile duct. No common bile duct stones, mass or obstruction. The gallbladder is surgically absent. Pancreas: Very limited examination due to motion artifact. Indistinct soft tissue fullness noted along the posterior aspect of the lower pancreatic head adjacent to the duodenum but very difficult to identified and measure a discrete mass. Normal caliber and course of the main pancreatic duct. The pancreatic body and tail show mild atrophy but no mass. Spleen:  Normal size.  No focal lesions. Adrenals/Urinary Tract:  No significant findings. Stomach/Bowel: The stomach is moderately distended. There is persistent inflammatory type changes involving the second portion duodenum along with a moderate-sized duodenum diverticulum. No new adrenal obstruction. Vascular/Lymphatic: The  major vascular structures are patent. The aorta and branch vessels are unremarkable. There are small gastrohepatic ligament and periportal lymph nodes. Other:  Small amount of free abdominal fluid. Musculoskeletal: No significant findings. IMPRESSION: 1. Limited examination due to patient motion. 2. Ill-defined soft tissue fullness between the pancreatic head and duodenum likely reflecting the mass abuts identified at surgery. Dedicated pancreatic CT protocol may be helpful for further evaluation and surgical planning. 3. Small gastrohepatic ligament periportal and celiac axis lymph nodes are indeterminate. 4. Distended stomach but no obvious doing renal obstruction. Moderate-sized duodenum diverticulum. 5. No definite MR findings for metastatic disease. 6. Bilateral pleural effusions and mild to moderate free abdominal fluid. Electronically Signed   By: Marijo Sanes M.D.   On: 01/27/2016 11:55    Assessment/Plan 1. Hyponatremia  Stable  Trending up  No need to continue IVF  Continue 1 liter/ day free water restriction  Recheck labs per PCP  DC statin- lipid panel unremarkable, decrease strain on liver  2. Hypotension, unspecified hypotension type  Stable  Resolved  Do not resume antihypertensives at this point  3. Status post exploratory laparotomy  Continue to monitor incision for infection  Do not scrub incision  Antibiotics completed  Continue working with PT/OT  Continue exercises taught by PT/OT  Hydrocodone/APAP 5/325- 1 tablet po Q Day prn- pain  Patient is being discharged with the following home health services:  [facility] PT[facility]  Patient is being discharged with the following durable medical equipment:  Rollator  Patient has been advised to f/u with their PCP in 1-2 weeks to bring them up to date on their rehab stay.  Social services at facility was responsible for arranging this appointment.  Pt was provided with a 30 day supply of prescriptions for medications and  refills must be obtained from their PCP.  For controlled substances, a more limited supply may be provided adequate until PCP appointment only.  Family/ staff Communication:   Total Time:  Documentation:  Face to Face:  Family/Phone:   Labs/tests ordered:    Medication list reviewed and assessed for continued appropriateness.  Vikki Ports, NP-C Geriatrics [facility] [phone], [place] [address] Cell Phone (Mon-Fri 8am-5pm):  [phone] On Call:  [phone] & follow prompts after 5pm & weekends Office Phone:  [phone] Office Fax:  [phone]

## 2016-02-20 ENCOUNTER — Encounter: Payer: Self-pay | Admitting: Surgery

## 2016-02-20 ENCOUNTER — Ambulatory Visit (INDEPENDENT_AMBULATORY_CARE_PROVIDER_SITE_OTHER): Payer: Medicare Other | Admitting: Surgery

## 2016-02-20 VITALS — BP 107/80 | HR 90 | Temp 97.7°F | Ht 62.0 in | Wt 131.0 lb

## 2016-02-20 DIAGNOSIS — Z9889 Other specified postprocedural states: Secondary | ICD-10-CM

## 2016-02-20 MED ORDER — MEGESTROL ACETATE 20 MG PO TABS: 20.0000 mg | ORAL_TABLET | Freq: Two times a day (BID) | ORAL | 0 refills | Status: AC

## 2016-02-20 NOTE — Patient Instructions (Addendum)
We have your EUS scheduled for 03/12/16 at 1:00pm. Please call our office if you have any questions or concerns. Please pick up your medicine at the pharmacy.

## 2016-02-20 NOTE — Progress Notes (Signed)
02/20/2016  HPI: Patient is status post exploratory laparotomy with intraoperative biopsy of a possible pancreatic mass that was benign on final pathology results. There is possibility of a possible cyst which could've been a diverticulum versus a pancreatic cyst. She was seen in the office on 12/7 and presents today for another follow-up appointment. She reports that her energy level has improved and that she is ambulating a little bit more and that she is no longer having any discomfort in her abdomen. However she still has minimal appetite but does drink her ensure shakes to have protein and calorie intake.  Vital signs: BP 107/80   Pulse 90   Temp 97.7 F (36.5 C) (Oral)   Ht 5\' 2"  (1.575 m)   Wt 59.4 kg (131 lb)   BMI 23.96 kg/m    Physical Exam: Constitutional: No acute distress Abdomen:  Soft, nondistended, nontender to palpation. Midline incision is well-healed, clean, dry, and intact with no evidence of infection  Assessment/Plan: 75 year old female status post expiratory laparotomy with intraoperative biopsy.  -Patient is currently set up for an upper endoscopy with endoscopic ultrasound with Dr. Tillie Rung on 03/12/16 for further evaluation of this possible diverticulum versus pancreatic cyst. The patient understands that depending on these results she may require a referral to tertiary care surgical Center for further workup or treatment. -We'll give the patient a prescription for Megace for appetite stimulation.  She will follow-up with her PCP regarding her appetite is well. -She may follow-up with [place] on an as-needed basis.   Melvyn Neth, [place]

## 2016-02-21 ENCOUNTER — Telehealth: Payer: Self-pay

## 2016-02-21 NOTE — Telephone Encounter (Signed)
  Oncology Nurse Navigator Documentation Voicemail left with Elizabeth Bailey. She has been discharged home from rehab. Instructions and date for EUS were previously mailed to her. Would like for her to return call to go over instructions in detail. Callback number left on voicemail.  Navigator Location: [facility] (02/21/16 1300)   )Navigator Encounter Type: Telephone (02/21/16 1300) Telephone: Elizabeth Bailey Call;Appt Confirmation/Clarification (02/21/16 1300)                       Barriers/Navigation Needs: Coordination of Care (02/21/16 1300)   Interventions: Coordination of Care (02/21/16 1300)   Coordination of Care: EUS (02/21/16 1300)                  Time Spent with Patient: 15 (02/21/16 1300)

## 2016-02-24 ENCOUNTER — Encounter: Payer: Self-pay | Admitting: Emergency Medicine

## 2016-02-24 ENCOUNTER — Inpatient Hospital Stay
Admission: EM | Admit: 2016-02-24 | Discharge: 2016-02-29 | DRG: 439 | Disposition: A | Discharge status: Home / Self Care | Payer: Medicare Other | Attending: Surgery | Admitting: Surgery

## 2016-02-24 ENCOUNTER — Emergency Department: Payer: Medicare Other

## 2016-02-24 DIAGNOSIS — M1990 Unspecified osteoarthritis, unspecified site: Secondary | ICD-10-CM | POA: Diagnosis present

## 2016-02-24 DIAGNOSIS — Z818 Family history of other mental and behavioral disorders: Secondary | ICD-10-CM

## 2016-02-24 DIAGNOSIS — F329 Major depressive disorder, single episode, unspecified: Secondary | ICD-10-CM | POA: Diagnosis present

## 2016-02-24 DIAGNOSIS — K219 Gastro-esophageal reflux disease without esophagitis: Secondary | ICD-10-CM | POA: Diagnosis present

## 2016-02-24 DIAGNOSIS — I10 Essential (primary) hypertension: Secondary | ICD-10-CM | POA: Diagnosis present

## 2016-02-24 DIAGNOSIS — E876 Hypokalemia: Secondary | ICD-10-CM | POA: Diagnosis not present

## 2016-02-24 DIAGNOSIS — Z90710 Acquired absence of both cervix and uterus: Secondary | ICD-10-CM

## 2016-02-24 DIAGNOSIS — M069 Rheumatoid arthritis, unspecified: Secondary | ICD-10-CM | POA: Diagnosis present

## 2016-02-24 DIAGNOSIS — Z823 Family history of stroke: Secondary | ICD-10-CM

## 2016-02-24 DIAGNOSIS — Z8249 Family history of ischemic heart disease and other diseases of the circulatory system: Secondary | ICD-10-CM | POA: Diagnosis not present

## 2016-02-24 DIAGNOSIS — Z803 Family history of malignant neoplasm of breast: Secondary | ICD-10-CM | POA: Diagnosis not present

## 2016-02-24 DIAGNOSIS — K863 Pseudocyst of pancreas: Secondary | ICD-10-CM | POA: Diagnosis present

## 2016-02-24 DIAGNOSIS — Z833 Family history of diabetes mellitus: Secondary | ICD-10-CM | POA: Diagnosis not present

## 2016-02-24 DIAGNOSIS — Z87891 Personal history of nicotine dependence: Secondary | ICD-10-CM | POA: Diagnosis not present

## 2016-02-24 DIAGNOSIS — R188 Other ascites: Secondary | ICD-10-CM

## 2016-02-24 DIAGNOSIS — K2980 Duodenitis without bleeding: Secondary | ICD-10-CM | POA: Diagnosis present

## 2016-02-24 DIAGNOSIS — Z6823 Body mass index (BMI) 23.0-23.9, adult: Secondary | ICD-10-CM

## 2016-02-24 DIAGNOSIS — E7800 Pure hypercholesterolemia, unspecified: Secondary | ICD-10-CM | POA: Diagnosis present

## 2016-02-24 DIAGNOSIS — E440 Moderate protein-calorie malnutrition: Secondary | ICD-10-CM | POA: Diagnosis present

## 2016-02-24 DIAGNOSIS — R112 Nausea with vomiting, unspecified: Secondary | ICD-10-CM

## 2016-02-24 DIAGNOSIS — Z7951 Long term (current) use of inhaled steroids: Secondary | ICD-10-CM

## 2016-02-24 DIAGNOSIS — E871 Hypo-osmolality and hyponatremia: Secondary | ICD-10-CM | POA: Diagnosis not present

## 2016-02-24 DIAGNOSIS — K859 Acute pancreatitis without necrosis or infection, unspecified: Secondary | ICD-10-CM | POA: Diagnosis present

## 2016-02-24 DIAGNOSIS — K8689 Other specified diseases of pancreas: Secondary | ICD-10-CM

## 2016-02-24 DIAGNOSIS — K5732 Diverticulitis of large intestine without perforation or abscess without bleeding: Secondary | ICD-10-CM | POA: Diagnosis present

## 2016-02-24 DIAGNOSIS — R1013 Epigastric pain: Secondary | ICD-10-CM | POA: Diagnosis present

## 2016-02-24 LAB — URINALYSIS, COMPLETE (UACMP) WITH MICROSCOPIC
BILIRUBIN URINE: NEGATIVE
Bacteria, UA: NONE SEEN
Glucose, UA: NEGATIVE mg/dL
HGB URINE DIPSTICK: NEGATIVE
Ketones, ur: 20 mg/dL — AB
LEUKOCYTES UA: NEGATIVE
NITRITE: NEGATIVE
PH: 5 (ref 5.0–8.0)
Protein, ur: 100 mg/dL — AB
SPECIFIC GRAVITY, URINE: 1.02 (ref 1.005–1.030)

## 2016-02-24 LAB — COMPREHENSIVE METABOLIC PANEL
ALBUMIN: 3.4 g/dL — AB (ref 3.5–5.0)
ALT: 15 U/L (ref 14–54)
ANION GAP: 16 — AB (ref 5–15)
AST: 36 U/L (ref 15–41)
Alkaline Phosphatase: 78 U/L (ref 38–126)
BILIRUBIN TOTAL: 1.5 mg/dL — AB (ref 0.3–1.2)
BUN: 16 mg/dL (ref 6–20)
CHLORIDE: 89 mmol/L — AB (ref 101–111)
CO2: 25 mmol/L (ref 22–32)
Calcium: 9.7 mg/dL (ref 8.9–10.3)
Creatinine, Ser: 1.3 mg/dL — ABNORMAL HIGH (ref 0.44–1.00)
GFR calc Af Amer: 46 mL/min — ABNORMAL LOW (ref >60)
GFR, EST NON AFRICAN AMERICAN: 39 mL/min — AB (ref >60)
Glucose, Bld: 139 mg/dL — ABNORMAL HIGH (ref 65–99)
POTASSIUM: 3.1 mmol/L — AB (ref 3.5–5.1)
Sodium: 130 mmol/L — ABNORMAL LOW (ref 135–145)
TOTAL PROTEIN: 8.6 g/dL — AB (ref 6.5–8.1)

## 2016-02-24 LAB — CBC
HEMATOCRIT: 32.9 % — AB (ref 35.0–47.0)
HEMOGLOBIN: 11.5 g/dL — AB (ref 12.0–16.0)
MCH: 32.1 pg (ref 26.0–34.0)
MCHC: 35 g/dL (ref 32.0–36.0)
MCV: 91.7 fL (ref 80.0–100.0)
Platelets: 504 10*3/uL — ABNORMAL HIGH (ref 150–440)
RBC: 3.59 MIL/uL — ABNORMAL LOW (ref 3.80–5.20)
RDW: 13.1 % (ref 11.5–14.5)
WBC: 12.5 10*3/uL — AB (ref 3.6–11.0)

## 2016-02-24 LAB — TROPONIN I

## 2016-02-24 LAB — LIPASE, BLOOD: Lipase: 49 U/L (ref 11–51)

## 2016-02-24 MED ORDER — IOPAMIDOL (ISOVUE-300) INJECTION 61%
75.0000 mL | Freq: Once | INTRAVENOUS | Status: AC | PRN
  Administered 2016-02-24: 75 mL via INTRAVENOUS

## 2016-02-24 MED ORDER — HYDROCHLOROTHIAZIDE 25 MG PO TABS
25.0000 mg | ORAL_TABLET | Freq: Every day | ORAL | Status: DC
  Administered 2016-02-24 – 2016-02-25 (×2): 25 mg via ORAL
  Filled 2016-02-24 (×2): qty 1

## 2016-02-24 MED ORDER — HEPARIN SODIUM (PORCINE) 5000 UNIT/ML IJ SOLN
5000.0000 [IU] | Freq: Three times a day (TID) | INTRAMUSCULAR | Status: DC
  Administered 2016-02-24 – 2016-02-29 (×13): 5000 [IU] via SUBCUTANEOUS
  Filled 2016-02-24 (×14): qty 1

## 2016-02-24 MED ORDER — ONDANSETRON HCL 4 MG/2ML IJ SOLN
4.0000 mg | Freq: Four times a day (QID) | INTRAMUSCULAR | Status: DC | PRN
  Administered 2016-02-24 – 2016-02-25 (×2): 4 mg via INTRAVENOUS
  Filled 2016-02-24 (×2): qty 2

## 2016-02-24 MED ORDER — IOPAMIDOL (ISOVUE-300) INJECTION 61%
30.0000 mL | Freq: Once | INTRAVENOUS | Status: AC | PRN
  Administered 2016-02-24: 30 mL via ORAL

## 2016-02-24 MED ORDER — KCL IN DEXTROSE-NACL 20-5-0.9 MEQ/L-%-% IV SOLN
INTRAVENOUS | Status: DC
  Administered 2016-02-24 – 2016-02-28 (×8): via INTRAVENOUS
  Filled 2016-02-24 (×13): qty 1000

## 2016-02-24 MED ORDER — LOSARTAN POTASSIUM-HCTZ 100-25 MG PO TABS: 1.0000 | ORAL_TABLET | Freq: Every day | ORAL | Status: DC

## 2016-02-24 MED ORDER — KCL IN DEXTROSE-NACL 20-5-0.45 MEQ/L-%-% IV SOLN
INTRAVENOUS | Status: DC
  Administered 2016-02-24: 1000 mL via INTRAVENOUS
  Filled 2016-02-24 (×4): qty 1000

## 2016-02-24 MED ORDER — MONTELUKAST SODIUM 10 MG PO TABS
10.0000 mg | ORAL_TABLET | Freq: Every day | ORAL | Status: DC
  Administered 2016-02-24 – 2016-02-29 (×6): 10 mg via ORAL
  Filled 2016-02-24 (×6): qty 1

## 2016-02-24 MED ORDER — ONDANSETRON HCL 4 MG PO TABS: 4.0000 mg | ORAL_TABLET | Freq: Four times a day (QID) | ORAL | Status: DC | PRN

## 2016-02-24 MED ORDER — SODIUM CHLORIDE 0.9 % IV BOLUS (SEPSIS)
1000.0000 mL | Freq: Once | INTRAVENOUS | Status: AC
  Administered 2016-02-24: 1000 mL via INTRAVENOUS

## 2016-02-24 MED ORDER — ONDANSETRON HCL 4 MG/2ML IJ SOLN
4.0000 mg | Freq: Once | INTRAMUSCULAR | Status: AC
  Administered 2016-02-24: 4 mg via INTRAVENOUS
  Filled 2016-02-24: qty 2

## 2016-02-24 MED ORDER — HYDROMORPHONE HCL 1 MG/ML IJ SOLN
0.5000 mg | INTRAMUSCULAR | Status: DC | PRN
  Administered 2016-02-24 – 2016-02-27 (×6): 0.5 mg via INTRAVENOUS
  Filled 2016-02-24 (×3): qty 0.5
  Filled 2016-02-24: qty 1
  Filled 2016-02-24 (×3): qty 0.5

## 2016-02-24 MED ORDER — LOSARTAN POTASSIUM 50 MG PO TABS
100.0000 mg | ORAL_TABLET | Freq: Every day | ORAL | Status: DC
  Administered 2016-02-24 – 2016-02-29 (×6): 100 mg via ORAL
  Filled 2016-02-24 (×6): qty 2

## 2016-02-24 MED ORDER — FLUTICASONE PROPIONATE 50 MCG/ACT NA SUSP
2.0000 | Freq: Every day | NASAL | Status: DC
  Administered 2016-02-24 – 2016-02-29 (×6): 2 via NASAL
  Filled 2016-02-24: qty 16

## 2016-02-24 MED ORDER — MEGESTROL ACETATE 20 MG PO TABS
20.0000 mg | ORAL_TABLET | Freq: Two times a day (BID) | ORAL | Status: DC
  Administered 2016-02-24 – 2016-02-29 (×10): 20 mg via ORAL
  Filled 2016-02-24 (×12): qty 1

## 2016-02-24 MED ORDER — ONDANSETRON HCL 4 MG/2ML IJ SOLN
INTRAMUSCULAR | Status: AC
  Administered 2016-02-24: 4 mg via INTRAVENOUS
  Filled 2016-02-24: qty 2

## 2016-02-24 MED ORDER — AMLODIPINE BESYLATE 5 MG PO TABS
5.0000 mg | ORAL_TABLET | Freq: Every day | ORAL | Status: DC
  Administered 2016-02-24 – 2016-02-29 (×6): 5 mg via ORAL
  Filled 2016-02-24 (×6): qty 1

## 2016-02-24 MED ORDER — ONDANSETRON HCL 4 MG/2ML IJ SOLN
4.0000 mg | Freq: Once | INTRAMUSCULAR | Status: AC
  Administered 2016-02-24: 4 mg via INTRAVENOUS

## 2016-02-24 MED ORDER — MOMETASONE FURO-FORMOTEROL FUM 200-5 MCG/ACT IN AERO
2.0000 | INHALATION_SPRAY | Freq: Two times a day (BID) | RESPIRATORY_TRACT | Status: DC
Start: 2016-02-24 — End: 2016-02-29
  Administered 2016-02-24 – 2016-02-29 (×10): 2 via RESPIRATORY_TRACT
  Filled 2016-02-24: qty 8.8

## 2016-02-24 MED ORDER — PANTOPRAZOLE SODIUM 40 MG IV SOLR
40.0000 mg | Freq: Two times a day (BID) | INTRAVENOUS | Status: DC
  Administered 2016-02-24 – 2016-02-29 (×10): 40 mg via INTRAVENOUS
  Filled 2016-02-24 (×10): qty 40

## 2016-02-24 NOTE — ED Triage Notes (Signed)
Pt from home with n/v and hemoptysis since yesterday. Pt released from rehab center Wednesday after surgery just after Thanksgiving for pancreas. Small amount of red blood being coughed up. Pt alert & oriented with NAD noted.

## 2016-02-24 NOTE — H&P (Signed)
Elizabeth Bailey is an 75 y.o. female.    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 She had been in rehabilitation until Wednesday and went home and then comes back today with nausea vomiting and some mild abdominal pain. The back pain that she was having prior to admission in November is gone. The true etiology of her pancreatitis versus pancreatic head mass is not clear as biopsies done intraoperatively were showing inflammatory process only and no malignancy.  Patient has multiple significant past medical history is as listed below. Patient states that she is acting confused or feels as if she is confused but answers questions appropriately both to me and to her son.  A CT scan has been personally reviewed showing fluid collections. These are around the duodenum and pancreas however no oral contrast was given as the patient cannot tolerate it and a repeat contrasted CT scan may be indicated.  Past Medical History:  Diagnosis Date  . Arthritis   . Asthma   . Chronic bronchitis ([place])   . Collagen vascular disease ([place])   . Depression   . Diverticulosis   . GERD (gastroesophageal reflux disease)   . High cholesterol   . Hypertension   . Seasonal allergies     Past Surgical History:  Procedure Laterality Date  . ABDOMINAL HYSTERECTOMY    . BREAST BIOPSY  1970's  . COLONOSCOPY  2005  . DG  BONE DENSITY ([place] [facility])  2010  . DIAGNOSTIC MAMMOGRAM  2015  . GALLBLADDER SURGERY    . PANCREAS SURGERY    . pap smear  2014  . REPAIR OF PERFORATED ULCER N/A 01/24/2016   Procedure: Exploratory Laparotomy with biopsy of Pancreas;  Surgeon: Florene Glen, MD;  Location: [facility] ORS;  Service: General;  Laterality: N/A;    Family History  Problem Relation Age of Onset  . Pneumonia Mother   . Depression Mother   . Depression Sister   . Breast cancer Sister 80  .  Diabetes Sister   . Stroke Brother   . Heart disease Brother    Social History:  reports that she has quit smoking. Her smoking use included Cigarettes. She has a 5.00 pack-year smoking history. She has never used smokeless tobacco. She reports that she drinks alcohol. She reports that she does not use drugs.  Allergies:  Allergies  Allergen Reactions  . Cyclobenzaprine     Other reaction(s): Other (See Comments) Frequent urination  . Flucytosine Other (See Comments)    Sore mouth  . Fluticasone-Salmeterol Other (See Comments)    Other reaction(s): Other (See Comments) Sore mouth  . Naproxen Nausea Only    GI upset  . Other Other (See Comments)    Other reaction(s): Other (See Comments) Frequent urination Other reaction(s): Other (See Comments) Frequent urination Frequent urination     (Not in a hospital admission)   Review of Systems  Constitutional: Positive for malaise/fatigue. Negative for chills and fever.  HENT: Negative.   Eyes: Negative.   Respiratory: Negative.   Cardiovascular: Negative.   Gastrointestinal: Positive for abdominal pain, nausea and vomiting. Negative for blood in stool, constipation, diarrhea, heartburn and melena.  Genitourinary: Negative.   Musculoskeletal: Negative.   Skin: Negative.   Neurological: Negative.   Endo/Heme/Allergies: Negative.   Psychiatric/Behavioral: Negative.      Physical Exam:  BP (!) 166/79   Pulse 71  Temp 97.9 F (36.6 C) (Oral)   Resp 19   Ht 5' 2"  (1.575 m)   Wt 130 lb (59 kg)   SpO2 100%   BMI 23.78 kg/m   Physical Exam  Constitutional: She is oriented to person, place, and time and well-developed, well-nourished, and in no distress. No distress.  HENT:  Head: Normocephalic and atraumatic.  Eyes: Pupils are equal, round, and reactive to light. Right eye exhibits no discharge. Left eye exhibits no discharge. No scleral icterus.  Neck: Normal range of motion.  Cardiovascular: Normal rate, regular  rhythm and normal heart sounds.   Pulmonary/Chest: Effort normal and breath sounds normal. No respiratory distress. She has no wheezes. She has no rales.  Abdominal: Soft. She exhibits no distension. There is no tenderness. There is no rebound and no guarding.  Midline scar healing well no erythema no drainage abdominal exam is benign with no tenderness and no tympany.  Musculoskeletal: Normal range of motion. She exhibits edema. She exhibits no tenderness or deformity.  Lymphadenopathy:    She has no cervical adenopathy.  Neurological: She is alert and oriented to person, place, and time.  Skin: Skin is warm and dry. No rash noted. She is not diaphoretic. No erythema.  Psychiatric: Mood and affect normal.  Vitals reviewed.       Results for orders placed or performed during the hospital encounter of 02/24/16 (from the past 48 hour(s))  Comprehensive metabolic panel     Status: Abnormal   Collection Time: 02/24/16 10:25 AM  Result Value Ref Range   Sodium 130 (L) 135 - 145 mmol/L   Potassium 3.1 (L) 3.5 - 5.1 mmol/L   Chloride 89 (L) 101 - 111 mmol/L   CO2 25 22 - 32 mmol/L   Glucose, Bld 139 (H) 65 - 99 mg/dL   BUN 16 6 - 20 mg/dL   Creatinine, Ser 1.30 (H) 0.44 - 1.00 mg/dL   Calcium 9.7 8.9 - 10.3 mg/dL   Total Protein 8.6 (H) 6.5 - 8.1 g/dL   Albumin 3.4 (L) 3.5 - 5.0 g/dL   AST 36 15 - 41 U/L   ALT 15 14 - 54 U/L   Alkaline Phosphatase 78 38 - 126 U/L   Total Bilirubin 1.5 (H) 0.3 - 1.2 mg/dL   GFR calc non Af Amer 39 (L) >60 mL/min   GFR calc Af Amer 46 (L) >60 mL/min    Comment: (NOTE) The eGFR has been calculated using the CKD EPI equation. This calculation has not been validated in all clinical situations. eGFR's persistently <60 mL/min signify possible Chronic Kidney Disease.    Anion gap 16 (H) 5 - 15  CBC     Status: Abnormal   Collection Time: 02/24/16 10:25 AM  Result Value Ref Range   WBC 12.5 (H) 3.6 - 11.0 K/uL   RBC 3.59 (L) 3.80 - 5.20 MIL/uL    Hemoglobin 11.5 (L) 12.0 - 16.0 g/dL   HCT 32.9 (L) 35.0 - 47.0 %   MCV 91.7 80.0 - 100.0 fL   MCH 32.1 26.0 - 34.0 pg   MCHC 35.0 32.0 - 36.0 g/dL   RDW 13.1 11.5 - 14.5 %   Platelets 504 (H) 150 - 440 K/uL  Lipase, blood     Status: None   Collection Time: 02/24/16 10:25 AM  Result Value Ref Range   Lipase 49 11 - 51 U/L  Troponin I     Status: None   Collection Time: 02/24/16 10:25 AM  Result Value Ref Range   Troponin I <0.03 <0.03 ng/mL  Urinalysis, Complete w Microscopic     Status: Abnormal   Collection Time: 02/24/16 11:00 AM  Result Value Ref Range   Color, Urine AMBER (A) YELLOW    Comment: BIOCHEMICALS MAY BE AFFECTED BY COLOR   APPearance CLEAR (A) CLEAR   Specific Gravity, Urine 1.020 1.005 - 1.030   pH 5.0 5.0 - 8.0   Glucose, UA NEGATIVE NEGATIVE mg/dL   Hgb urine dipstick NEGATIVE NEGATIVE   Bilirubin Urine NEGATIVE NEGATIVE   Ketones, ur 20 (A) NEGATIVE mg/dL   Protein, ur 100 (A) NEGATIVE mg/dL   Nitrite NEGATIVE NEGATIVE   Leukocytes, UA NEGATIVE NEGATIVE   RBC / HPF 0-5 0 - 5 RBC/hpf   WBC, UA 0-5 0 - 5 WBC/hpf   Bacteria, UA NONE SEEN NONE SEEN   Squamous Epithelial / LPF 0-5 (A) NONE SEEN   Mucous PRESENT    Hyaline Casts, UA PRESENT    Dg Chest 1 View  Result Date: 02/24/2016 CLINICAL DATA:  Hemoptysis with nausea and vomiting since yesterday. EXAM: CHEST 1 VIEW COMPARISON:  01/24/2016 FINDINGS: Lungs are adequately inflated without consolidation or effusion. Cardiomediastinal silhouette is within normal. There is calcified plaque over the thoracic aorta. There are mild degenerative changes of the spine. IMPRESSION: No acute cardiopulmonary disease. Electronically Signed   By: Marin Olp M.D.   On: 02/24/2016 10:32   Ct Abdomen Pelvis W Contrast  Result Date: 02/24/2016 CLINICAL DATA:  Flank pain, nausea, vomiting and arm opt assist since yesterday. EXAM: CT ABDOMEN AND PELVIS WITH CONTRAST TECHNIQUE: Multidetector CT imaging of the abdomen and  pelvis was performed using the standard protocol following bolus administration of intravenous contrast. CONTRAST:  75 ml ISOVUE-300 IOPAMIDOL (ISOVUE-300) INJECTION 61% COMPARISON:  MRI abdomen 01/27/2016. CT chest, abdomen and pelvis 01/24/2016. FINDINGS: Lower chest: Emphysematous disease is seen in the lung bases. Right lower lobe pulmonary nodule on image 7 measuring 0.9 cm is unchanged since the prior CT. Lung bases are otherwise clear. Small hiatal hernia noted. No pleural or pericardial effusion. Hepatobiliary: Status post cholecystectomy. The liver appears normal. Biliary tree is unremarkable Pancreas: A new fluid collection is seen in the uncinate process the pancreas measuring 0.9 x 1.1 cm on image 31. Additionally, 2 other fluid collections are new since the prior exams. Collection on the right abuts the the undersurface of the right hepatic lobe and first portion of the duodenum measuring 3.5 x 2.7 x 4.2 cm craniocaudal. More central collection measures 4.5 x 2.2 x 3.9 cm craniocaudal and abuts the posterior aspect of body of the stomach. The remainder of the pancreas is unremarkable. There is no pancreatic ductal dilatation. Spleen: Negative. Adrenals/Urinary Tract: Negative. Stomach/Bowel: Extensive stranding is present about the first and second portions of the duodenum and there is wall thickening about these structures. Small bowel is otherwise unremarkable. Extensive sigmoid diverticulosis is noted. Wall thickening about the sigmoid is unchanged. The colon is otherwise unremarkable. The appendix appears normal. Vascular/Lymphatic: Extensive aortoiliac atherosclerosis without aneurysm. Reproductive: Status post hysterectomy. Other: No fracture or worrisome lesion. Musculoskeletal: None. IMPRESSION: Findings most consistent with duodenitis about the first and second portions of the duodenum. Three new fluid collections are identified as described above which could be abscesses or pseudocysts. Note  is made that the patient's lipase is normal. Endoscopy could be used for further evaluation. Sigmoid diverticulosis with chronic wall thickening compatible with diverticulitis. Extensive atherosclerosis. No change in a 0.9 cm  right lower lobe pulmonary nodule. Emphysema. Electronically Signed   By: Inge Rise M.D.   On: 02/24/2016 12:18     Assessment/Plan  Noncontrasted CT scan was performed as patient cannot tolerate oral contrast. There are fluid collections in the area of the duodenum and uncinate process but it is not quite clear if some of these are actually intraluminal as no contrast is present.  The etiology of this patient's process is unclear. Her biopsies been negative with the exception of findings of inflammatory change in the pancreatic head but there was a mass present and palpable at the time of her last surgery a month ago.  Because of the patient's nausea vomiting of recommended admission to the hospital. She has an EGD scheduled for 2 weeks from now but I would like to obtain GI consultation and asked them to become more involved and probably or possibly move up the EGD timing. I do not now believe that this is a perforated viscus as was her preoperative diagnosis back in November. This plan was discussed with she and her son they understood and agreed to proceed also discussed this with the emergency room physician  Florene Glen, MD, FACS

## 2016-02-24 NOTE — Progress Notes (Signed)
[facility] responded to an order to visit with Pt in [place], who had requested for prayers. [place] met Pt. Pt talked about the loss of her husband, struggles with poor health, son's support, and the support she had received from her son's church and pastor. Pt was hopeful that she would get better soon. Pt requested for prayers for healing, which the [facility] provided.   02/24/16 1800  Clinical Encounter Type  Visited With Patient  Visit Type Initial;Spiritual support  Referral From Nurse  Consult/Referral To Chaplain  Spiritual Encounters  Spiritual Needs Prayer

## 2016-02-24 NOTE — ED Notes (Signed)
Pt to CT scan.

## 2016-02-24 NOTE — ED Notes (Signed)
Pt vomiting and nauseous in room. MD made aware.

## 2016-02-24 NOTE — ED Provider Notes (Signed)
[facility] Emergency Department Provider Note  ____________________________________________   First MD Initiated Contact with Patient 02/24/16 1011     (approximate)  I have reviewed the triage vital signs and the nursing notes.   HISTORY  Chief Complaint Emesis and Hemoptysis   HPI Elizabeth Bailey is a 75 y.o. female with a history of a recently discovered pancreatic mass with biopsy was presenting to the emergency department with epigastric abdominal pain as well as nausea and vomiting. She is also reporting blood streaks in the vomitus. She says that she is able to move her bowels and had a normal bowel movement yesterday. She also says that she is passing gas. She says that these were the symptoms that originally brought her into the hospital when the mass was discovered. She is not taking any antinausea medicine at home. She was just discharged from rehabilitation and after several days at home is now having recurrent symptoms. Denies any blood in her stool. She had pathology of her mass which came back negative for a malignant process. Says that she has vomited approximately 4 times.   Past Medical History:  Diagnosis Date  . Arthritis   . Asthma   . Chronic bronchitis ([place])   . Collagen vascular disease ([place])   . Depression   . Diverticulosis   . GERD (gastroesophageal reflux disease)   . High cholesterol   . Hypertension   . Seasonal allergies     Patient Active Problem List   Diagnosis Date Noted  . Duodenitis 02/24/2016  . Hyponatremia 02/17/2016  . Status post exploratory laparotomy 02/06/2016  . Perforated duodenal ulcer ([place])   . Acute gastric ulcer with perforation ([place])   . Nodule of finger 03/28/2015  . Rheumatoid arthritis ([place]) 01/09/2015  . Insomnia 01/09/2015  . Allergic state 11/28/2014  . Airway hyperreactivity 11/28/2014  . Pulmonary emphysema ([place]) 11/28/2014  . BP (high blood pressure) 11/28/2014  . Apnea, sleep  11/28/2014  . Depression 11/28/2014  . Carotid artery stenosis 11/28/2014  . H/O carotid endarterectomy 11/28/2014  . Seasonal allergies 11/28/2014  . Hyperlipidemia 11/28/2014  . Need for influenza vaccination 11/28/2014  . Encounter to establish care 11/28/2014  . Abnormal finding on liver function 11/29/2013  . Arthritis 11/29/2013  . Iritis 11/29/2013  . Neuritis or radiculitis due to rupture of lumbar intervertebral disc 08/22/2013  . Herpes zona 08/08/2013  . Cervical radiculitis 08/07/2013  . DDD (degenerative disc disease), cervical 08/07/2013    Past Surgical History:  Procedure Laterality Date  . ABDOMINAL HYSTERECTOMY    . BREAST BIOPSY  1970's  . COLONOSCOPY  2005  . DG  BONE DENSITY ([place] HX)  2010  . DIAGNOSTIC MAMMOGRAM  2015  . GALLBLADDER SURGERY    . PANCREAS SURGERY    . pap smear  2014  . REPAIR OF PERFORATED ULCER N/A 01/24/2016   Procedure: Exploratory Laparotomy with biopsy of Pancreas;  Surgeon: Florene Glen, MD;  Location: [facility] ORS;  Service: General;  Laterality: N/A;    Prior to Admission medications   Medication Sig Start Date End Date Taking? Authorizing Provider  Adalimumab (HUMIRA PEN) 40 MG/0.8ML PNKT Inject into the skin. 12/06/14  Yes Historical Provider, MD  amLODipine (NORVASC) 5 MG tablet TAKE 1 TABLET BY MOUTH ONCE DAILY. 11/29/15  Yes Arlis Porta., MD  bisacodyl (DULCOLAX) 10 MG suppository Place 1 suppository (10 mg total) rectally daily as needed for moderate constipation. 01/31/16  Yes Olean Ree, MD  budesonide-formoterol (SYMBICORT) 160-4.5 MCG/ACT inhaler Inhale 1 puff into the lungs 2 (two) times daily. 12/05/15  Yes Arlis Porta., MD  calcium-vitamin D (CALCIUM 500/D) 500-200 MG-UNIT tablet Take 1 tablet by mouth daily.   Yes Historical Provider, MD  escitalopram (LEXAPRO) 20 MG tablet Take 1 tablet daily 09/10/15  Yes Arlis Porta., MD  feeding supplement (BOOST / RESOURCE BREEZE) LIQD Take 1 Container by mouth  3 (three) times daily between meals. 01/31/16  Yes Olean Ree, MD  fluticasone (FLONASE) 50 MCG/ACT nasal spray Place 2 sprays into the nose daily as needed.   Yes Historical Provider, MD  folic acid (FOLVITE) 1 MG tablet Take 1 tablet by mouth daily.   Yes Historical Provider, MD  HYDROcodone-acetaminophen (NORCO/VICODIN) 5-325 MG tablet Take 1 tablet by mouth daily as needed. 11/28/15  Yes Historical Provider, MD  losartan-hydrochlorothiazide (HYZAAR) 100-25 MG tablet TAKE 1 TABLET BY MOUTH ONCE DAILY. 11/29/15  Yes Arlis Porta., MD  megestrol (MEGACE) 20 MG tablet Take 1 tablet (20 mg total) by mouth 2 (two) times daily. 02/20/16 03/21/16 Yes Olean Ree, MD  Melatonin 10 MG TABS Take 2 tablets by mouth at bedtime. Reported on 06/03/2015   Yes Historical Provider, MD  montelukast (SINGULAIR) 10 MG tablet TAKE 1 TABLET BY MOUTH ONCE DAILY. 11/29/15  Yes Arlis Porta., MD  Multiple Vitamin (MULTI-VITAMINS) TABS Take 1 tablet by mouth daily.   Yes Historical Provider, MD  Naproxen Sod-Diphenhydramine (ALEVE PM) 220-25 MG TABS Take by mouth.   Yes Historical Provider, MD  Omega 3 1000 MG CAPS Take 1,000 mg by mouth. omega-3 fatty acids-vitamin E (FISH OIL) 1,000 mg   Yes Historical Provider, MD  pantoprazole (PROTONIX) 40 MG tablet Take 1 tablet (40 mg total) by mouth daily. 01/31/16 03/01/16 Yes Jose Piscoya, MD  polyethylene glycol (MIRALAX / GLYCOLAX) packet Take 17 g by mouth daily. 01/31/16  Yes Olean Ree, MD  simvastatin (ZOCOR) 20 MG tablet Take 1 tablet every other night. Patient taking differently: Take 20 mg by mouth every other day. Take 1 tablet every night. 01/09/15  Yes Arlis Porta., MD    Allergies Cyclobenzaprine; Flucytosine; Fluticasone-salmeterol; Naproxen; and Other  Family History  Problem Relation Age of Onset  . Pneumonia Mother   . Depression Mother   . Depression Sister   . Breast cancer Sister 57  . Diabetes Sister   . Stroke Brother   . Heart  disease Brother     Social History Social History  Substance Use Topics  . Smoking status: Former Smoker    Packs/day: 0.50    Years: 10.00    Types: Cigarettes  . Smokeless tobacco: Never Used     Comment: quit 1988  . Alcohol use 0.0 oz/week     Comment: wine twice a month     Review of Systems Constitutional: No fever/chills Eyes: No visual changes. ENT: No sore throat. Cardiovascular: Denies chest pain. Respiratory: Denies shortness of breath. Gastrointestinal:  No diarrhea.  No constipation. Genitourinary: Negative for dysuria. Musculoskeletal: Negative for back pain. Skin: Negative for rash. Neurological: Negative for headaches, focal weakness or numbness.  10-point ROS otherwise negative.  ____________________________________________   PHYSICAL EXAM:  VITAL SIGNS: ED Triage Vitals  Enc Vitals Group     BP 02/24/16 0953 135/80     Pulse Rate 02/24/16 0953 82     Resp 02/24/16 0953 15     Temp 02/24/16 0953 98.7 F (37.1 C)  Temp Source 02/24/16 0953 Oral     SpO2 02/24/16 0953 100 %     Weight 02/24/16 0953 131 lb (59.4 kg)     Height 02/24/16 0953 5\' 2"  (1.575 m)     Head Circumference --      Peak Flow --      Pain Score 02/24/16 1003 6     Pain Loc --      Pain Edu? --      Excl. in [place]? --     Constitutional: Alert and oriented. Well appearing and in no acute distress.  The vomitus bag at the bedside with clear vomitus and it without any blood. Eyes: Conjunctivae are normal. PERRL. EOMI. Head: Atraumatic. Nose: No congestion/rhinnorhea. Mouth/Throat: Mucous membranes are moist.   Neck: No stridor.   Cardiovascular: Normal rate, regular rhythm. Grossly normal heart sounds.   Respiratory: Normal respiratory effort.  No retractions. Lungs CTAB. Gastrointestinal: Soft With mild epigastric tenderness to palpation. No distention.  Musculoskeletal: No lower extremity tenderness nor edema.  No joint effusions. Neurologic:  Normal speech and  language. No gross focal neurologic deficits are appreciated.  Skin:  Skin is warm, dry and intact. No rash noted. Psychiatric: Mood and affect are normal. Speech and behavior are normal.  ____________________________________________   LABS (all labs ordered are listed, but only abnormal results are displayed)  Labs Reviewed  COMPREHENSIVE METABOLIC PANEL - Abnormal; Notable for the following:       Result Value   Sodium 130 (*)    Potassium 3.1 (*)    Chloride 89 (*)    Glucose, Bld 139 (*)    Creatinine, Ser 1.30 (*)    Total Protein 8.6 (*)    Albumin 3.4 (*)    Total Bilirubin 1.5 (*)    GFR calc non Af Amer 39 (*)    GFR calc Af Amer 46 (*)    Anion gap 16 (*)    All other components within normal limits  CBC - Abnormal; Notable for the following:    WBC 12.5 (*)    RBC 3.59 (*)    Hemoglobin 11.5 (*)    HCT 32.9 (*)    Platelets 504 (*)    All other components within normal limits  URINALYSIS, COMPLETE (UACMP) WITH MICROSCOPIC - Abnormal; Notable for the following:    Color, Urine AMBER (*)    APPearance CLEAR (*)    Ketones, ur 20 (*)    Protein, ur 100 (*)    Squamous Epithelial / LPF 0-5 (*)    All other components within normal limits  LIPASE, BLOOD  TROPONIN I  CBC  CREATININE, SERUM  POC OCCULT BLOOD, ED   ____________________________________________  EKG  ED ECG REPORT I, Doran Stabler, the attending physician, personally viewed and interpreted this ECG.   Date: 02/24/2016  EKG Time: 1027  Rate: 75  Rhythm: normal sinus rhythm with PVC times one  Axis:   Intervals:Borderline prolonged QT interval.  ST&T Change: No ST segment elevation or depression. T-wave inversion in aVL.  ____________________________________________  RADIOLOGY  DG Chest 1 View (Accession [id number]) (Order [id number])  Imaging  Date: 02/24/2016 Department: [facility] EMERGENCY DEPARTMENT Released By/Authorizing: Orbie Pyo, MD  (auto-released)  Exam Information   Status Exam Begun  Exam Ended   Final [99] 02/24/2016 10:15 AM 02/24/2016 10:26 AM  PACS Images   Show images for DG Chest 1 View  Study Result   CLINICAL DATA:  Hemoptysis with  nausea and vomiting since yesterday.  EXAM: CHEST 1 VIEW  COMPARISON:  01/24/2016  FINDINGS: Lungs are adequately inflated without consolidation or effusion. Cardiomediastinal silhouette is within normal. There is calcified plaque over the thoracic aorta. There are mild degenerative changes of the spine.  IMPRESSION: No acute cardiopulmonary disease.   Electronically Signed   By: Marin Olp M.D.   On: 02/24/2016 10:32     CT Abdomen Pelvis W Contrast (Accession [id number]) (Order [id number])  Imaging  Date: 02/24/2016 Department: [facility] EMERGENCY DEPARTMENT Released By/Authorizing: Orbie Pyo, MD (auto-released)  Exam Information   Status Exam Begun  Exam Ended   Final [99] 02/24/2016 11:33 AM 02/24/2016 11:50 AM  PACS Images   Show images for CT Abdomen Pelvis W Contrast  Study Result   CLINICAL DATA:  Flank pain, nausea, vomiting and arm opt assist since yesterday.  EXAM: CT ABDOMEN AND PELVIS WITH CONTRAST  TECHNIQUE: Multidetector CT imaging of the abdomen and pelvis was performed using the standard protocol following bolus administration of intravenous contrast.  CONTRAST:  75 ml ISOVUE-300 IOPAMIDOL (ISOVUE-300) INJECTION 61%  COMPARISON:  MRI abdomen 01/27/2016. CT chest, abdomen and pelvis 01/24/2016.  FINDINGS: Lower chest: Emphysematous disease is seen in the lung bases. Right lower lobe pulmonary nodule on image 7 measuring 0.9 cm is unchanged since the prior CT. Lung bases are otherwise clear. Small hiatal hernia noted. No pleural or pericardial effusion.  Hepatobiliary: Status post cholecystectomy. The liver appears normal. Biliary tree is unremarkable  Pancreas: A new  fluid collection is seen in the uncinate process the pancreas measuring 0.9 x 1.1 cm on image 31. Additionally, 2 other fluid collections are new since the prior exams. Collection on the right abuts the the undersurface of the right hepatic lobe and first portion of the duodenum measuring 3.5 x 2.7 x 4.2 cm craniocaudal. More central collection measures 4.5 x 2.2 x 3.9 cm craniocaudal and abuts the posterior aspect of body of the stomach. The remainder of the pancreas is unremarkable. There is no pancreatic ductal dilatation.  Spleen: Negative.  Adrenals/Urinary Tract: Negative.  Stomach/Bowel: Extensive stranding is present about the first and second portions of the duodenum and there is wall thickening about these structures. Small bowel is otherwise unremarkable. Extensive sigmoid diverticulosis is noted. Wall thickening about the sigmoid is unchanged. The colon is otherwise unremarkable. The appendix appears normal.  Vascular/Lymphatic: Extensive aortoiliac atherosclerosis without aneurysm.  Reproductive: Status post hysterectomy.  Other: No fracture or worrisome lesion.  Musculoskeletal: None.  IMPRESSION: Findings most consistent with duodenitis about the first and second portions of the duodenum. Three new fluid collections are identified as described above which could be abscesses or pseudocysts. Note is made that the patient's lipase is normal. Endoscopy could be used for further evaluation.  Sigmoid diverticulosis with chronic wall thickening compatible with diverticulitis.  Extensive atherosclerosis.  No change in a 0.9 cm right lower lobe pulmonary nodule.  Emphysema.   Electronically Signed   By: Inge Rise M.D.   On: 02/24/2016 12:18     ____________________________________________   PROCEDURES  Procedure(s) performed:   Procedures  Critical Care performed:   ____________________________________________   INITIAL  IMPRESSION / ASSESSMENT AND PLAN / ED COURSE  Pertinent labs & imaging results that were available during my care of the patient were reviewed by me and considered in my medical decision making (see chart for details).   Clinical Course   ----------------------------------------- 2:05 PM on 02/24/2016 -----------------------------------------  Patient with  continual vomiting despite 2 doses of Zofran and IV fluids. New development of fluid collections on her CAT scan. Case was discussed with Dr. Burt Knack who will be admitting her to the hospital and requested NG tube placement. Discussed this plan with patient as well as family area the size of the bedside. They're understanding the plan and willing to comply.   ____________________________________________   FINAL CLINICAL IMPRESSION(S) / ED DIAGNOSES  Abdominal pain. Intra-abdominal fluid collections. Nausea and vomiting.    NEW MEDICATIONS STARTED DURING THIS VISIT:  New Prescriptions   No medications on file     Note:  This document was prepared using Dragon voice recognition software and may include unintentional dictation errors.    Orbie Pyo, MD 02/24/16 [phone]

## 2016-02-24 NOTE — Consult Note (Addendum)
[place] [facility] [place] NAME: Elizabeth Bailey    MR#:  [id number]  DATE OF BIRTH:  [birth year]  DATE OF ADMISSION:  02/24/2016  PRIMARY CARE PHYSICIAN: Dicky Doe, MD   REQUESTING/REFERRING PHYSICIAN: Burt Knack  CHIEF COMPLAINT:   Chief Complaint  Patient presents with  . Emesis  . Hemoptysis    HISTORY OF PRESENT ILLNESS: Merika Wythe  is a 75 y.o. female with a known history of Asthma, chronic bronchitis, collagen vascular disease, depression, diverticulosis, high cholesterol, hypertension- was admitted last month with suspected intestinal perforation and surgery was done but as per Dr. Burt Knack no perforation was found at that time and he tried to do a biopsy on the pancreatic mass which resulted now to be all inflamatory. She was sent to rehabilitation and from there she went home last week. She has overall decreased appetite and oral intake since this episode. But able to tolerate regular food and all kind of diet. Since last evening she started having abdominal pain and vomiting and nausea. She denies any associated diarrhea, bleeding, fever or chills. Came to emergency room and on CT scan abdomen and she was found to have some fluid collection or possible pseudocyst around the pancreatic head and thickening of the duodenum. She was referred to GI after last month surgery and she is scheduled already to have an endoscopy in second week of January.  PAST MEDICAL HISTORY:   Past Medical History:  Diagnosis Date  . Arthritis   . Asthma   . Chronic bronchitis ([place])   . Collagen vascular disease ([place])   . Depression   . Diverticulosis   . GERD (gastroesophageal reflux disease)   . High cholesterol   . Hypertension   . Seasonal allergies     PAST SURGICAL HISTORY: Past Surgical History:  Procedure Laterality Date  . ABDOMINAL HYSTERECTOMY    . BREAST BIOPSY  1970's  . COLONOSCOPY  2005  . DG  BONE DENSITY ([place] [facility])  2010  . DIAGNOSTIC  MAMMOGRAM  2015  . GALLBLADDER SURGERY    . PANCREAS SURGERY    . pap smear  2014  . REPAIR OF PERFORATED ULCER N/A 01/24/2016   Procedure: Exploratory Laparotomy with biopsy of Pancreas;  Surgeon: Florene Glen, MD;  Location: [facility] ORS;  Service: General;  Laterality: N/A;    SOCIAL HISTORY:  Social History  Substance Use Topics  . Smoking status: Former Smoker    Packs/day: 0.50    Years: 10.00    Types: Cigarettes  . Smokeless tobacco: Never Used     Comment: quit 1988  . Alcohol use 0.0 oz/week     Comment: wine twice a month     FAMILY HISTORY:  Family History  Problem Relation Age of Onset  . Pneumonia Mother   . Depression Mother   . Depression Sister   . Breast cancer Sister 105  . Diabetes Sister   . Stroke Brother   . Heart disease Brother     DRUG ALLERGIES:  Allergies  Allergen Reactions  . Cyclobenzaprine     Other reaction(s): Other (See Comments) Frequent urination  . Flucytosine Other (See Comments)    Sore mouth  . Fluticasone-Salmeterol Other (See Comments)    Other reaction(s): Other (See Comments) Sore mouth  . Naproxen Nausea Only    GI upset  . Other Other (See Comments)    Other reaction(s): Other (See Comments) Frequent urination Other reaction(s): Other (See Comments) Frequent  urination Frequent urination    REVIEW OF SYSTEMS:   CONSTITUTIONAL: No fever, fatigue or weakness.  EYES: No blurred or double vision.  EARS, NOSE, AND THROAT: No tinnitus or ear pain.  RESPIRATORY: No cough, shortness of breath, wheezing or hemoptysis.  CARDIOVASCULAR: No chest pain, orthopnea, edema.  GASTROINTESTINAL: positive for nausea, vomiting, ho diarrhea, she have abdominal pain.  GENITOURINARY: No dysuria, hematuria.  ENDOCRINE: No polyuria, nocturia,  HEMATOLOGY: No anemia, easy bruising or bleeding SKIN: No rash or lesion. MUSCULOSKELETAL: No joint pain or arthritis.   NEUROLOGIC: No tingling, numbness, weakness.  PSYCHIATRY: No anxiety  or depression.   MEDICATIONS AT HOME:  Prior to Admission medications   Medication Sig Start Date End Date Taking? Authorizing Provider  Adalimumab (HUMIRA PEN) 40 MG/0.8ML PNKT Inject into the skin. 12/06/14  Yes Historical Provider, MD  amLODipine (NORVASC) 5 MG tablet TAKE 1 TABLET BY MOUTH ONCE DAILY. 11/29/15  Yes Arlis Porta., MD  bisacodyl (DULCOLAX) 10 MG suppository Place 1 suppository (10 mg total) rectally daily as needed for moderate constipation. 01/31/16  Yes Olean Ree, MD  budesonide-formoterol (SYMBICORT) 160-4.5 MCG/ACT inhaler Inhale 1 puff into the lungs 2 (two) times daily. 12/05/15  Yes Arlis Porta., MD  calcium-vitamin D (CALCIUM 500/D) 500-200 MG-UNIT tablet Take 1 tablet by mouth daily.   Yes Historical Provider, MD  escitalopram (LEXAPRO) 20 MG tablet Take 1 tablet daily 09/10/15  Yes Arlis Porta., MD  feeding supplement (BOOST / RESOURCE BREEZE) LIQD Take 1 Container by mouth 3 (three) times daily between meals. 01/31/16  Yes Olean Ree, MD  fluticasone (FLONASE) 50 MCG/ACT nasal spray Place 2 sprays into the nose daily as needed.   Yes Historical Provider, MD  folic acid (FOLVITE) 1 MG tablet Take 1 tablet by mouth daily.   Yes Historical Provider, MD  HYDROcodone-acetaminophen (NORCO/VICODIN) 5-325 MG tablet Take 1 tablet by mouth daily as needed. 11/28/15  Yes Historical Provider, MD  losartan-hydrochlorothiazide (HYZAAR) 100-25 MG tablet TAKE 1 TABLET BY MOUTH ONCE DAILY. 11/29/15  Yes Arlis Porta., MD  megestrol (MEGACE) 20 MG tablet Take 1 tablet (20 mg total) by mouth 2 (two) times daily. 02/20/16 03/21/16 Yes Olean Ree, MD  Melatonin 10 MG TABS Take 2 tablets by mouth at bedtime. Reported on 06/03/2015   Yes Historical Provider, MD  montelukast (SINGULAIR) 10 MG tablet TAKE 1 TABLET BY MOUTH ONCE DAILY. 11/29/15  Yes Arlis Porta., MD  Multiple Vitamin (MULTI-VITAMINS) TABS Take 1 tablet by mouth daily.   Yes Historical Provider, MD   Naproxen Sod-Diphenhydramine (ALEVE PM) 220-25 MG TABS Take by mouth.   Yes Historical Provider, MD  Omega 3 1000 MG CAPS Take 1,000 mg by mouth. omega-3 fatty acids-vitamin E (FISH OIL) 1,000 mg   Yes Historical Provider, MD  pantoprazole (PROTONIX) 40 MG tablet Take 1 tablet (40 mg total) by mouth daily. 01/31/16 03/01/16 Yes Jose Piscoya, MD  polyethylene glycol (MIRALAX / GLYCOLAX) packet Take 17 g by mouth daily. 01/31/16  Yes Olean Ree, MD  simvastatin (ZOCOR) 20 MG tablet Take 1 tablet every other night. Patient taking differently: Take 20 mg by mouth every other day. Take 1 tablet every night. 01/09/15  Yes Arlis Porta., MD      PHYSICAL EXAMINATION:   VITAL SIGNS: Blood pressure (!) 156/68, pulse 70, temperature 98.6 F (37 C), temperature source Oral, resp. rate (!) 22, height 5\' 2"  (1.575 m), weight 59.2 kg (130 lb  9.6 oz), SpO2 99 %.  GENERAL:  75 y.o.-year-old patient lying in the bed with no acute distress.  EYES: Pupils equal, round, reactive to light and accommodation. No scleral icterus. Extraocular muscles intact.  HEENT: Head atraumatic, normocephalic. Oropharynx and nasopharynx clear.  NECK:  Supple, no jugular venous distention. No thyroid enlargement, no tenderness.  LUNGS: Normal breath sounds bilaterally, no wheezing, rales,rhonchi or crepitation. No use of accessory muscles of respiration.  CARDIOVASCULAR: S1, S2 normal. No murmurs, rubs, or gallops.  ABDOMEN: Soft, mild tender, nondistended. Bowel sounds present. No organomegaly or mass.  EXTREMITIES: No pedal edema, cyanosis, or clubbing.  NEUROLOGIC: Cranial nerves II through XII are intact. Muscle strength 5/5 in all extremities. Sensation intact. Gait not checked.  PSYCHIATRIC: The patient is alert and oriented x 3.  SKIN: No obvious rash, lesion, or ulcer.   LABORATORY PANEL:   CBC  Recent Labs Lab 02/18/16 0610 02/24/16 1025  WBC 7.6 12.5*  HGB 10.7* 11.5*  HCT 30.8* 32.9*  PLT 502* 504*   MCV 93.8 91.7  MCH 32.6 32.1  MCHC 34.8 35.0  RDW 13.2 13.1  LYMPHSABS 2.1  --   MONOABS 0.8  --   EOSABS 0.3  --   BASOSABS 0.1  --    ------------------------------------------------------------------------------------------------------------------  Chemistries   Recent Labs Lab 02/18/16 0610 02/24/16 1025  NA 130* 130*  K 3.9 3.1*  CL 92* 89*  CO2 27 25  GLUCOSE 96 139*  BUN 17 16  CREATININE 1.07* 1.30*  CALCIUM 9.3 9.7  AST 26 36  ALT 13* 15  ALKPHOS 65 78  BILITOT 0.8 1.5*   ------------------------------------------------------------------------------------------------------------------ estimated creatinine clearance is 30 mL/min (by C-G formula based on SCr of 1.3 mg/dL (H)). ------------------------------------------------------------------------------------------------------------------ No results for input(s): TSH, T4TOTAL, T3FREE, THYROIDAB in the last 72 hours.  Invalid input(s): FREET3   Coagulation profile No results for input(s): INR, PROTIME in the last 168 hours. ------------------------------------------------------------------------------------------------------------------- No results for input(s): DDIMER in the last 72 hours. -------------------------------------------------------------------------------------------------------------------  Cardiac Enzymes  Recent Labs Lab 02/24/16 1025  TROPONINI <0.03   ------------------------------------------------------------------------------------------------------------------ Invalid input(s): POCBNP  ---------------------------------------------------------------------------------------------------------------  Urinalysis    Component Value Date/Time   COLORURINE AMBER (A) 02/24/2016 1100   APPEARANCEUR CLEAR (A) 02/24/2016 1100   LABSPEC 1.020 02/24/2016 1100   PHURINE 5.0 02/24/2016 1100   GLUCOSEU NEGATIVE 02/24/2016 1100   HGBUR NEGATIVE 02/24/2016 1100   BILIRUBINUR NEGATIVE  02/24/2016 1100   KETONESUR 20 (A) 02/24/2016 1100   PROTEINUR 100 (A) 02/24/2016 1100   NITRITE NEGATIVE 02/24/2016 1100   LEUKOCYTESUR NEGATIVE 02/24/2016 1100     RADIOLOGY: Dg Chest 1 View  Result Date: 02/24/2016 CLINICAL DATA:  Hemoptysis with nausea and vomiting since yesterday. EXAM: CHEST 1 VIEW COMPARISON:  01/24/2016 FINDINGS: Lungs are adequately inflated without consolidation or effusion. Cardiomediastinal silhouette is within normal. There is calcified plaque over the thoracic aorta. There are mild degenerative changes of the spine. IMPRESSION: No acute cardiopulmonary disease. Electronically Signed   By: Marin Olp M.D.   On: 02/24/2016 10:32   Ct Abdomen Pelvis W Contrast  Result Date: 02/24/2016 CLINICAL DATA:  Flank pain, nausea, vomiting and arm opt assist since yesterday. EXAM: CT ABDOMEN AND PELVIS WITH CONTRAST TECHNIQUE: Multidetector CT imaging of the abdomen and pelvis was performed using the standard protocol following bolus administration of intravenous contrast. CONTRAST:  75 ml ISOVUE-300 IOPAMIDOL (ISOVUE-300) INJECTION 61% COMPARISON:  MRI abdomen 01/27/2016. CT chest, abdomen and pelvis 01/24/2016. FINDINGS: Lower chest: Emphysematous disease is seen in  the lung bases. Right lower lobe pulmonary nodule on image 7 measuring 0.9 cm is unchanged since the prior CT. Lung bases are otherwise clear. Small hiatal hernia noted. No pleural or pericardial effusion. Hepatobiliary: Status post cholecystectomy. The liver appears normal. Biliary tree is unremarkable Pancreas: A new fluid collection is seen in the uncinate process the pancreas measuring 0.9 x 1.1 cm on image 31. Additionally, 2 other fluid collections are new since the prior exams. Collection on the right abuts the the undersurface of the right hepatic lobe and first portion of the duodenum measuring 3.5 x 2.7 x 4.2 cm craniocaudal. More central collection measures 4.5 x 2.2 x 3.9 cm craniocaudal and abuts the  posterior aspect of body of the stomach. The remainder of the pancreas is unremarkable. There is no pancreatic ductal dilatation. Spleen: Negative. Adrenals/Urinary Tract: Negative. Stomach/Bowel: Extensive stranding is present about the first and second portions of the duodenum and there is wall thickening about these structures. Small bowel is otherwise unremarkable. Extensive sigmoid diverticulosis is noted. Wall thickening about the sigmoid is unchanged. The colon is otherwise unremarkable. The appendix appears normal. Vascular/Lymphatic: Extensive aortoiliac atherosclerosis without aneurysm. Reproductive: Status post hysterectomy. Other: No fracture or worrisome lesion. Musculoskeletal: None. IMPRESSION: Findings most consistent with duodenitis about the first and second portions of the duodenum. Three new fluid collections are identified as described above which could be abscesses or pseudocysts. Note is made that the patient's lipase is normal. Endoscopy could be used for further evaluation. Sigmoid diverticulosis with chronic wall thickening compatible with diverticulitis. Extensive atherosclerosis. No change in a 0.9 cm right lower lobe pulmonary nodule. Emphysema. Electronically Signed   By: Inge Rise M.D.   On: 02/24/2016 12:18    EKG: Orders placed or performed during the hospital encounter of 02/24/16  . ED EKG  . ED EKG    IMPRESSION AND PLAN:  * Duodenitis   IV fluids and PRN nausea meds.   No fever Or elevated WBCs- so may hold on Abx at this time.  * pancreatitis   On CT there is also concern about mass Vs pseudocyst.   She is scheduled for EGD as out pt, suggest to get GI consult inpatient.  * hypertension   Stable, monitor with meds.  * COPD   No exacerbation, Cont inhalers.  * hyponatremia and hypokalemia   D5 NS with KCl IV fluid and monitor.     All the records are reviewed and case discussed with ED provider. Management plans discussed with the patient,  family and they are in agreement.  CODE STATUS: full    Code Status Orders        Start     Ordered   02/24/16 1352  Full code  Continuous     02/24/16 1401    Code Status History    Date Active Date Inactive Code Status Order ID Comments User Context   01/24/2016  5:09 PM 01/31/2016  4:29 PM Full Code [id number]  Florene Glen, MD Inpatient     Margorie John and sister present in room.  TOTAL TIME TAKING CARE OF THIS PATIENT: 45 minutes.    Vaughan Basta M.D on 02/24/2016   Between 7am to 6pm - Pager - [phone]  After 6pm go to [email or web address] - password [email or web address] [facility]  Office  [phone]  CC: Primary care physician; Dicky Doe, MD   Note: This dictation was prepared with Dragon dictation along with smaller phrase technology. Any transcriptional errors  that result from this process are unintentional.

## 2016-02-24 NOTE — ED Notes (Signed)
Three RN's attempted NG tube a total of 5 times. Both nares were attempted with more success on the right side than the left but tube continued to have difficulty progressing past pts mouth. In all five attempts NG tube coiled in pts mouth. PTs nose is currently bleeding. Dr. Burt Knack called by this nurse and verbalized to take a break with NG tube insertion.

## 2016-02-25 LAB — COMPREHENSIVE METABOLIC PANEL
ALT: 11 U/L — AB (ref 14–54)
AST: 21 U/L (ref 15–41)
Albumin: 2.5 g/dL — ABNORMAL LOW (ref 3.5–5.0)
Alkaline Phosphatase: 59 U/L (ref 38–126)
Anion gap: 6 (ref 5–15)
BUN: 13 mg/dL (ref 6–20)
CHLORIDE: 100 mmol/L — AB (ref 101–111)
CO2: 28 mmol/L (ref 22–32)
CREATININE: 0.95 mg/dL (ref 0.44–1.00)
Calcium: 8.4 mg/dL — ABNORMAL LOW (ref 8.9–10.3)
GFR calc Af Amer: >60 mL/min (ref >60)
GFR, EST NON AFRICAN AMERICAN: 58 mL/min — AB (ref >60)
Glucose, Bld: 121 mg/dL — ABNORMAL HIGH (ref 65–99)
Potassium: 3.1 mmol/L — ABNORMAL LOW (ref 3.5–5.1)
SODIUM: 134 mmol/L — AB (ref 135–145)
Total Bilirubin: 0.9 mg/dL (ref 0.3–1.2)
Total Protein: 6.7 g/dL (ref 6.5–8.1)

## 2016-02-25 LAB — CBC
HCT: 27.3 % — ABNORMAL LOW (ref 35.0–47.0)
Hemoglobin: 9.3 g/dL — ABNORMAL LOW (ref 12.0–16.0)
MCH: 31.7 pg (ref 26.0–34.0)
MCHC: 34.3 g/dL (ref 32.0–36.0)
MCV: 92.5 fL (ref 80.0–100.0)
PLATELETS: 430 10*3/uL (ref 150–440)
RBC: 2.95 MIL/uL — ABNORMAL LOW (ref 3.80–5.20)
RDW: 12.9 % (ref 11.5–14.5)
WBC: 11.4 10*3/uL — ABNORMAL HIGH (ref 3.6–11.0)

## 2016-02-25 NOTE — Progress Notes (Signed)
[place] at [place] NAME: Elizabeth Bailey    MR#:  [id number]  DATE OF BIRTH:  [birth year]  SUBJECTIVE:  CHIEF COMPLAINT:   Chief Complaint  Patient presents with  . Emesis  . Hemoptysis  feels nauseous, not having much pain. REVIEW OF SYSTEMS:  Review of Systems  Constitutional: Negative for chills, fever and weight loss.  HENT: Negative for nosebleeds and sore throat.   Eyes: Negative for blurred vision.  Respiratory: Negative for cough, shortness of breath and wheezing.   Cardiovascular: Negative for chest pain, orthopnea, leg swelling and PND.  Gastrointestinal: Positive for abdominal pain and nausea. Negative for constipation, diarrhea and heartburn.  Genitourinary: Negative for dysuria and urgency.  Musculoskeletal: Negative for back pain.  Skin: Negative for rash.  Neurological: Negative for dizziness, speech change, focal weakness and headaches.  Endo/Heme/Allergies: Does not bruise/bleed easily.  Psychiatric/Behavioral: Negative for depression.   DRUG ALLERGIES:   Allergies  Allergen Reactions  . Cyclobenzaprine     Other reaction(s): Other (See Comments) Frequent urination  . Flucytosine Other (See Comments)    Sore mouth  . Fluticasone-Salmeterol Other (See Comments)    Other reaction(s): Other (See Comments) Sore mouth  . Naproxen Nausea Only    GI upset  . Other Other (See Comments)    Other reaction(s): Other (See Comments) Frequent urination Other reaction(s): Other (See Comments) Frequent urination Frequent urination   VITALS:  Blood pressure (!) 120/59, pulse 79, temperature 99.3 F (37.4 C), temperature source Oral, resp. rate 16, height 5\' 2"  (1.575 m), weight 59.2 kg (130 lb 9.6 oz), SpO2 97 %. PHYSICAL EXAMINATION:  Physical Exam  Constitutional: She is oriented to person, place, and time and well-developed, well-nourished, and in no distress.  HENT:  Head: Normocephalic and atraumatic.  Eyes:  Conjunctivae and EOM are normal. Pupils are equal, round, and reactive to light.  Neck: Normal range of motion. Neck supple. No tracheal deviation present. No thyromegaly present.  Cardiovascular: Normal rate, regular rhythm and normal heart sounds.   Pulmonary/Chest: Effort normal and breath sounds normal. No respiratory distress. She has no wheezes. She exhibits no tenderness.  Abdominal: Soft. Bowel sounds are normal. She exhibits no distension. There is no tenderness.  Musculoskeletal: Normal range of motion.  Neurological: She is alert and oriented to person, place, and time. No cranial nerve deficit.  Skin: Skin is warm and dry. No rash noted.  Psychiatric: Mood and affect normal.   LABORATORY PANEL:   CBC  Recent Labs Lab 02/25/16 0440  WBC 11.4*  HGB 9.3*  HCT 27.3*  PLT 430   ------------------------------------------------------------------------------------------------------------------ Chemistries   Recent Labs Lab 02/25/16 0440  NA 134*  K 3.1*  CL 100*  CO2 28  GLUCOSE 121*  BUN 13  CREATININE 0.95  CALCIUM 8.4*  AST 21  ALT 11*  ALKPHOS 59  BILITOT 0.9   RADIOLOGY:  No results found. ASSESSMENT AND PLAN:  * Duodenitis   IV fluids and PRN nausea meds.   No fever Or elevated WBCs- so may hold on Abx at this time. - c/s GI - no coverage today  * Pancreatitis   On CT there is also concern about mass Vs pseudocyst.   May need EGD - pending GI c/s  * Hypertension   Stable, monitor with meds.  * COPD   No exacerbation, Cont inhalers.  * hyponatremia and hypokalemia   D5 NS with KCl IV fluid and monitor. Stop HCTZ which  is likely making his K worse     All the records are reviewed and case discussed with Care Management/Social Worker. Management plans discussed with the patient, family and they are in agreement.  CODE STATUS: FULL CODE  TOTAL TIME TAKING CARE OF THIS PATIENT: 35 minutes.   More than 50% of the time was spent in  counseling/coordination of care: YES   Max Sane M.D on 02/25/2016 at 2:27 PM  Between 7am to 6pm - Pager - [phone]  After 6pm go to [email or web address] - Proofreader  [facility]  Office  [phone]  CC: Primary care physician; Dicky Doe, MD  Note: This dictation was prepared with Dragon dictation along with smaller phrase technology. Any transcriptional errors that result from this process are unintentional.

## 2016-02-25 NOTE — Progress Notes (Signed)
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 The etiology of them are not clear however and the suspicion for malignancy is still quite high in spite of benign prior biopsies.  Her pain is better but still present she's had no further nausea or vomiting and is passing gas  Objective: Vital signs in last 24 hours: Temp:  [98.1 F (36.7 C)-98.8 F (37.1 C)] 98.8 F (37.1 C) (12/26 0512) Pulse Rate:  [63-73] 63 (12/26 1007) Resp:  [20-26] 20 (12/26 0512) BP: (105-156)/(51-73) 105/58 (12/26 1007) SpO2:  [91 %-99 %] 99 % (12/26 1007) Weight:  [130 lb 9.6 oz (59.2 kg)] 130 lb 9.6 oz (59.2 kg) (12/25 1534) Last BM Date: 02/23/16  Intake/Output from previous day: 12/25 0701 - 12/26 0700 In: 1028.3 [I.V.:1028.3] Out: -  Intake/Output this shift: Total I/O In: 592 [I.V.:592] Out: -   Physical exam:  Awake alert oriented CTA RR abdomen is soft nondistended nontympanitic and nontender midline scar is healing well no erythema or drainage calves are nontender minimal edema.  Lab Results: CBC   Recent Labs  02/24/16 1025 02/25/16 0440  WBC 12.5* 11.4*  HGB 11.5* 9.3*  HCT 32.9* 27.3*  PLT 504* 430   BMET  Recent Labs  02/24/16 1025 02/25/16 0440  NA 130* 134*  K 3.1* 3.1*  CL 89* 100*  CO2 25 28  GLUCOSE 139* 121*  BUN 16 13  CREATININE 1.30* 0.95  CALCIUM 9.7 8.4*   PT/INR No results for input(s): LABPROT, INR in the last 72 hours. ABG No results for input(s): PHART, HCO3 in the last 72 hours.  Invalid input(s): PCO2, PO2  Studies/Results: Dg Chest 1 View  Result Date: 02/24/2016 CLINICAL DATA:  Hemoptysis with nausea and vomiting since yesterday. EXAM: CHEST 1 VIEW COMPARISON:  01/24/2016 FINDINGS: Lungs are adequately inflated without consolidation or effusion. Cardiomediastinal silhouette is within normal. There is calcified plaque over the thoracic aorta. There are mild degenerative  changes of the spine. IMPRESSION: No acute cardiopulmonary disease. Electronically Signed   By: Marin Olp M.D.   On: 02/24/2016 10:32   Ct Abdomen Pelvis W Contrast  Result Date: 02/24/2016 CLINICAL DATA:  Flank pain, nausea, vomiting and arm opt assist since yesterday. EXAM: CT ABDOMEN AND PELVIS WITH CONTRAST TECHNIQUE: Multidetector CT imaging of the abdomen and pelvis was performed using the standard protocol following bolus administration of intravenous contrast. CONTRAST:  75 ml ISOVUE-300 IOPAMIDOL (ISOVUE-300) INJECTION 61% COMPARISON:  MRI abdomen 01/27/2016. CT chest, abdomen and pelvis 01/24/2016. FINDINGS: Lower chest: Emphysematous disease is seen in the lung bases. Right lower lobe pulmonary nodule on image 7 measuring 0.9 cm is unchanged since the prior CT. Lung bases are otherwise clear. Small hiatal hernia noted. No pleural or pericardial effusion. Hepatobiliary: Status post cholecystectomy. The liver appears normal. Biliary tree is unremarkable Pancreas: A new fluid collection is seen in the uncinate process the pancreas measuring 0.9 x 1.1 cm on image 31. Additionally, 2 other fluid collections are new since the prior exams. Collection on the right abuts the the undersurface of the right hepatic lobe and first portion of the duodenum measuring 3.5 x 2.7 x 4.2 cm craniocaudal. More central collection measures 4.5 x 2.2 x 3.9 cm craniocaudal and abuts the posterior aspect of body of the stomach. The remainder of the pancreas is unremarkable. There is no pancreatic ductal dilatation. Spleen: Negative. Adrenals/Urinary Tract: Negative. Stomach/Bowel: Extensive stranding is present about the first and second portions  of the duodenum and there is wall thickening about these structures. Small bowel is otherwise unremarkable. Extensive sigmoid diverticulosis is noted. Wall thickening about the sigmoid is unchanged. The colon is otherwise unremarkable. The appendix appears normal.  Vascular/Lymphatic: Extensive aortoiliac atherosclerosis without aneurysm. Reproductive: Status post hysterectomy. Other: No fracture or worrisome lesion. Musculoskeletal: None. IMPRESSION: Findings most consistent with duodenitis about the first and second portions of the duodenum. Three new fluid collections are identified as described above which could be abscesses or pseudocysts. Note is made that the patient's lipase is normal. Endoscopy could be used for further evaluation. Sigmoid diverticulosis with chronic wall thickening compatible with diverticulitis. Extensive atherosclerosis. No change in a 0.9 cm right lower lobe pulmonary nodule. Emphysema. Electronically Signed   By: Inge Rise M.D.   On: 02/24/2016 12:18    Anti-infectives: Anti-infectives    None      Assessment/Plan:  White blood cell count remains slightly elevated abdomen is soft and minimally tender if at all.  Suspect pseudocyst secondary to pancreatitis possibly secondary to a malignancy. I would like to repeat an MRI at this point to see if we can delineate an etiology for this. I will also ask GI to see the patient.  Florene Glen, MD, FACS  02/25/2016

## 2016-02-25 NOTE — Plan of Care (Signed)
Problem: Pain Managment: Goal: General experience of comfort will improve Outcome: Progressing Patient educated to call when she is experiencing any discomfort

## 2016-02-25 NOTE — Progress Notes (Signed)
Pt alert and sitting up with son in room. Pt is hard of hearing. Son lives in [place]. 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 [facility] offered prayer.   02/25/16 1030  Clinical Encounter Type  Visited With Patient and family together  Visit Type Initial  Referral From Nurse  Spiritual Encounters  Spiritual Needs Prayer;Emotional  Stress Factors  Patient Stress Factors None identified

## 2016-02-26 ENCOUNTER — Inpatient Hospital Stay: Payer: Medicare Other

## 2016-02-26 DIAGNOSIS — R188 Other ascites: Secondary | ICD-10-CM

## 2016-02-26 DIAGNOSIS — K863 Pseudocyst of pancreas: Secondary | ICD-10-CM

## 2016-02-26 DIAGNOSIS — E440 Moderate protein-calorie malnutrition: Secondary | ICD-10-CM | POA: Insufficient documentation

## 2016-02-26 LAB — MAGNESIUM: MAGNESIUM: 1.3 mg/dL — AB (ref 1.7–2.4)

## 2016-02-26 MED ORDER — GADOBENATE DIMEGLUMINE 529 MG/ML IV SOLN
15.0000 mL | Freq: Once | INTRAVENOUS | Status: AC | PRN
  Administered 2016-02-26: 12 mL via INTRAVENOUS

## 2016-02-26 MED ORDER — PRO-STAT SUGAR FREE PO LIQD
30.0000 mL | Freq: Three times a day (TID) | ORAL | Status: DC
  Administered 2016-02-26 – 2016-02-27 (×2): 30 mL via ORAL

## 2016-02-26 MED ORDER — POTASSIUM CHLORIDE CRYS ER 20 MEQ PO TBCR
40.0000 meq | EXTENDED_RELEASE_TABLET | Freq: Once | ORAL | Status: AC
  Administered 2016-02-26: 40 meq via ORAL
  Filled 2016-02-26: qty 2

## 2016-02-26 NOTE — Progress Notes (Signed)
[facility] [place] NAME: Elizabeth Bailey    MR#:  [id number]  DATE OF BIRTH:  Jul 18, 1940  SUBJECTIVE:  CHIEF COMPLAINT:   Chief Complaint  Patient presents with  . Emesis  . Hemoptysis  feels Tired, otherwise feeling same REVIEW OF SYSTEMS:  Review of Systems  Constitutional: Negative for chills, fever and weight loss.  HENT: Negative for nosebleeds and sore throat.   Eyes: Negative for blurred vision.  Respiratory: Negative for cough, shortness of breath and wheezing.   Cardiovascular: Negative for chest pain, orthopnea, leg swelling and PND.  Gastrointestinal: Positive for abdominal pain and nausea. Negative for constipation, diarrhea and heartburn.  Genitourinary: Negative for dysuria and urgency.  Musculoskeletal: Negative for back pain.  Skin: Negative for rash.  Neurological: Negative for dizziness, speech change, focal weakness and headaches.  Endo/Heme/Allergies: Does not bruise/bleed easily.  Psychiatric/Behavioral: Negative for depression.   DRUG ALLERGIES:   Allergies  Allergen Reactions  . Cyclobenzaprine     Other reaction(s): Other (See Comments) Frequent urination  . Flucytosine Other (See Comments)    Sore mouth  . Fluticasone-Salmeterol Other (See Comments)    Other reaction(s): Other (See Comments) Sore mouth  . Naproxen Nausea Only    GI upset  . Other Other (See Comments)    Other reaction(s): Other (See Comments) Frequent urination Other reaction(s): Other (See Comments) Frequent urination Frequent urination   VITALS:  Blood pressure (!) 142/72, pulse 72, temperature 98.9 F (37.2 C), temperature source Oral, resp. rate 18, height 5\' 2"  (1.575 m), weight 59.2 kg (130 lb 9.6 oz), SpO2 97 %. PHYSICAL EXAMINATION:  Physical Exam  Constitutional: She is oriented to person, place, and time and well-developed, well-nourished, and in no distress.  HENT:  Head: Normocephalic and atraumatic.  Eyes:  Conjunctivae and EOM are normal. Pupils are equal, round, and reactive to light.  Neck: Normal range of motion. Neck supple. No tracheal deviation present. No thyromegaly present.  Cardiovascular: Normal rate, regular rhythm and normal heart sounds.   Pulmonary/Chest: Effort normal and breath sounds normal. No respiratory distress. She has no wheezes. She exhibits no tenderness.  Abdominal: Soft. Bowel sounds are normal. She exhibits no distension. There is no tenderness.  Musculoskeletal: Normal range of motion.  Neurological: She is alert and oriented to person, place, and time. No cranial nerve deficit.  Skin: Skin is warm and dry. No rash noted.  Psychiatric: Mood and affect normal.   LABORATORY PANEL:   CBC  Recent Labs Lab 02/25/16 0440  WBC 11.4*  HGB 9.3*  HCT 27.3*  PLT 430   ------------------------------------------------------------------------------------------------------------------ Chemistries   Recent Labs Lab 02/25/16 0440  NA 134*  K 3.1*  CL 100*  CO2 28  GLUCOSE 121*  BUN 13  CREATININE 0.95  CALCIUM 8.4*  AST 21  ALT 11*  ALKPHOS 59  BILITOT 0.9   RADIOLOGY:  Mr 3d Recon At Scanner  Result Date: 02/26/2016 CLINICAL DATA:  75 year old female with history of possible pancreatic head mass. Nausea, vomiting and mild abdominal pain. EXAM: MRI ABDOMEN WITHOUT AND WITH CONTRAST (INCLUDING MRCP) TECHNIQUE: Multiplanar multisequence MR imaging of the abdomen was performed both before and after the administration of intravenous contrast. Heavily T2-weighted images of the biliary and pancreatic ducts were obtained, and three-dimensional MRCP images were rendered by post processing. CONTRAST:  49mL MULTIHANCE GADOBENATE DIMEGLUMINE 529 MG/ML IV SOLN COMPARISON:  MRI of the abdomen 01/27/2016. FINDINGS: Lower chest: Small bilateral pleural effusions lying  dependently. Hepatobiliary: No discrete cystic or solid hepatic lesions are noted. MRCP demonstrates no intra  or extrahepatic biliary ductal dilatation. Common bile duct measures 4 mm in the porta hepatis. No filling defect in the common bile duct to suggest choledocholithiasis. Status post cholecystectomy. Pancreas: In the inferior aspect of the pancreatic head there is a 10 x 8 mm lesion (image 37 of series 17) which is isointense on T1 weighted images, slightly T2 hyperintense, and does not appear to enhance, new compared to prior examination, favored to represent a small pancreatic pseudocyst. No other definite pancreatic mass identified. MRCP images demonstrate no pancreatic ductal dilatation. The main pancreatic duct is partially obliterated as it traverses through the pancreatic head on MRCP and additional T2 weighted sequences, however, given the lack of main pancreatic ductal dilatation, this is presumably related to some mild mass effect from adjacent pancreatic head fluid collection and other adjacent inflammatory changes, rather than an obstructing lesion. There are inflammatory changes adjacent to the pancreatic head and duodenum, where there is increased T2 signal intensity throughout the associated retroperitoneal fat, as well as multiple abnormal fluid collections (discussed below). Spleen:  Unremarkable. Adrenals/Urinary Tract: Bilateral kidneys and bilateral adrenal glands are normal in appearance. Mild bilateral perinephric stranding (nonspecific). No hydroureteronephrosis in the visualized portions of the abdomen. Stomach/Bowel: The appearance of the proximal stomach is normal. In the region of the antrum there are multiple T1 hypointense, T2 hyperintense lesions associated with the distal stomach and proximal duodenum which demonstrate some progressive enhancement of their walls, presumably multiple pancreatic pseudocysts. The largest of these is adjacent to the pylorus measuring up to 2.5 x 4.2 cm (image 14 of series 22), with an additional lesion inferior and medial to this measuring up to 1.7 x 2.7  cm. Several other smaller lesions are also noted in the region of the gastrohepatic ligament, also likely to represent multiple tiny pancreatic pseudocysts. All of these are surrounded areas of increased T2 signal intensity indicative of inflammation. Vascular/Lymphatic: Atherosclerosis in the abdominal aorta, without evidence of aneurysm in the visualized abdominal vasculature. No lymphadenopathy noted in the abdomen. Other: Trace volume of free fluid adjacent to the pancreatic head and proximal duodenum. Musculoskeletal: No aggressive osseous lesions are noted in the visualized portions of the skeleton. IMPRESSION: 1. Today's study again demonstrates inflammatory changes adjacent to the pancreatic head, proximal duodenum (first and second portions of the duodenum), and the gastric antrum, presumably sequela of pancreatitis with multiple pancreatic pseudocysts in the head of the pancreas, gastrohepatic ligament, and in the superior aspect of the wall of the antrum of the stomach, as detailed above. 2. No definite pancreatic mass noted. No signs of pancreatic ductal dilatation or common bile duct dilatation. 3. No evidence of choledocholithiasis. 4. Aortic atherosclerosis. Electronically Signed   By: Vinnie Langton M.D.   On: 02/26/2016 11:31   Mr Abdomen Mrcp Moise Boring Contast  Result Date: 02/26/2016 CLINICAL DATA:  75 year old female with history of possible pancreatic head mass. Nausea, vomiting and mild abdominal pain. EXAM: MRI ABDOMEN WITHOUT AND WITH CONTRAST (INCLUDING MRCP) TECHNIQUE: Multiplanar multisequence MR imaging of the abdomen was performed both before and after the administration of intravenous contrast. Heavily T2-weighted images of the biliary and pancreatic ducts were obtained, and three-dimensional MRCP images were rendered by post processing. CONTRAST:  39mL MULTIHANCE GADOBENATE DIMEGLUMINE 529 MG/ML IV SOLN COMPARISON:  MRI of the abdomen 01/27/2016. FINDINGS: Lower chest: Small  bilateral pleural effusions lying dependently. Hepatobiliary: No discrete cystic or solid hepatic  lesions are noted. MRCP demonstrates no intra or extrahepatic biliary ductal dilatation. Common bile duct measures 4 mm in the porta hepatis. No filling defect in the common bile duct to suggest choledocholithiasis. Status post cholecystectomy. Pancreas: In the inferior aspect of the pancreatic head there is a 10 x 8 mm lesion (image 37 of series 17) which is isointense on T1 weighted images, slightly T2 hyperintense, and does not appear to enhance, new compared to prior examination, favored to represent a small pancreatic pseudocyst. No other definite pancreatic mass identified. MRCP images demonstrate no pancreatic ductal dilatation. The main pancreatic duct is partially obliterated as it traverses through the pancreatic head on MRCP and additional T2 weighted sequences, however, given the lack of main pancreatic ductal dilatation, this is presumably related to some mild mass effect from adjacent pancreatic head fluid collection and other adjacent inflammatory changes, rather than an obstructing lesion. There are inflammatory changes adjacent to the pancreatic head and duodenum, where there is increased T2 signal intensity throughout the associated retroperitoneal fat, as well as multiple abnormal fluid collections (discussed below). Spleen:  Unremarkable. Adrenals/Urinary Tract: Bilateral kidneys and bilateral adrenal glands are normal in appearance. Mild bilateral perinephric stranding (nonspecific). No hydroureteronephrosis in the visualized portions of the abdomen. Stomach/Bowel: The appearance of the proximal stomach is normal. In the region of the antrum there are multiple T1 hypointense, T2 hyperintense lesions associated with the distal stomach and proximal duodenum which demonstrate some progressive enhancement of their walls, presumably multiple pancreatic pseudocysts. The largest of these is adjacent to the  pylorus measuring up to 2.5 x 4.2 cm (image 14 of series 22), with an additional lesion inferior and medial to this measuring up to 1.7 x 2.7 cm. Several other smaller lesions are also noted in the region of the gastrohepatic ligament, also likely to represent multiple tiny pancreatic pseudocysts. All of these are surrounded areas of increased T2 signal intensity indicative of inflammation. Vascular/Lymphatic: Atherosclerosis in the abdominal aorta, without evidence of aneurysm in the visualized abdominal vasculature. No lymphadenopathy noted in the abdomen. Other: Trace volume of free fluid adjacent to the pancreatic head and proximal duodenum. Musculoskeletal: No aggressive osseous lesions are noted in the visualized portions of the skeleton. IMPRESSION: 1. Today's study again demonstrates inflammatory changes adjacent to the pancreatic head, proximal duodenum (first and second portions of the duodenum), and the gastric antrum, presumably sequela of pancreatitis with multiple pancreatic pseudocysts in the head of the pancreas, gastrohepatic ligament, and in the superior aspect of the wall of the antrum of the stomach, as detailed above. 2. No definite pancreatic mass noted. No signs of pancreatic ductal dilatation or common bile duct dilatation. 3. No evidence of choledocholithiasis. 4. Aortic atherosclerosis. Electronically Signed   By: Vinnie Langton M.D.   On: 02/26/2016 11:31   ASSESSMENT AND PLAN:  * Duodenitis  - continue IV fluids and PRN nausea meds. 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  * COPD   No exacerbation, Cont inhalers.  * hyponatremia and hypokalemia   D5 NS with KCl IV fluid and monitor. Stop HCTZ which is likely making his K worse - Replete potassium today, check magnesium    All the records are reviewed and case discussed with Care  Management/Social Worker. Management plans discussed with the patient, family and they are in agreement.  CODE STATUS: FULL CODE  TOTAL TIME TAKING CARE  OF THIS PATIENT: 15 minutes.   More than 50% of the time was spent in counseling/coordination of care: YES   Max Sane M.D on 02/26/2016 at 2:50 PM  Between 7am to 6pm - Pager - [phone]  After 6pm go to [email or web address] - Proofreader  [facility]  Office  [phone]  CC: Primary care physician; Dicky Doe, MD  Note: This dictation was prepared with Dragon dictation along with smaller phrase technology. Any transcriptional errors that result from this process are unintentional.

## 2016-02-26 NOTE — Progress Notes (Signed)
CC: pancreatitis/ sympt pseudocysts  Subjective: Feeling better, taking clears, no Emesis. AVSS  Objective: Vital signs in last 24 hours: Temp:  [98.3 F (36.8 C)-99.3 F (37.4 C)] 99 F (37.2 C) (12/27 0842) Pulse Rate:  [74-83] 74 (12/27 0842) Resp:  [16-20] 20 (12/27 0442) BP: (120-156)/(55-70) 150/70 (12/27 0842) SpO2:  [97 %] 97 % (12/27 0842) Last BM Date: 02/24/16  Intake/Output from previous day: 12/26 0701 - 12/27 0700 In: 2443 [P.O.:180; I.V.:2263] Out: 200 [Urine:200] Intake/Output this shift: Total I/O In: 280 [P.O.:120; I.V.:160] Out: -   Physical exam: NAD , non toxic, aler and awake  Abd: soft, NT, previous scar well healed Ext: no edema , well perfused  Lab Results: CBC   Recent Labs  02/24/16 1025 02/25/16 0440  WBC 12.5* 11.4*  HGB 11.5* 9.3*  HCT 32.9* 27.3*  PLT 504* 430   BMET  Recent Labs  02/24/16 1025 02/25/16 0440  NA 130* 134*  K 3.1* 3.1*  CL 89* 100*  CO2 25 28  GLUCOSE 139* 121*  BUN 16 13  CREATININE 1.30* 0.95  CALCIUM 9.7 8.4*   PT/INR No results for input(s): LABPROT, INR in the last 72 hours. ABG No results for input(s): PHART, HCO3 in the last 72 hours.  Invalid input(s): PCO2, PO2  Studies/Results: Mr 3d Recon At Scanner  Result Date: 02/26/2016 CLINICAL DATA:  75 year old female with history of possible pancreatic head mass. Nausea, vomiting and mild abdominal pain. EXAM: MRI ABDOMEN WITHOUT AND WITH CONTRAST (INCLUDING MRCP) TECHNIQUE: Multiplanar multisequence MR imaging of the abdomen was performed both before and after the administration of intravenous contrast. Heavily T2-weighted images of the biliary and pancreatic ducts were obtained, and three-dimensional MRCP images were rendered by post processing. CONTRAST:  54mL MULTIHANCE GADOBENATE DIMEGLUMINE 529 MG/ML IV SOLN COMPARISON:  MRI of the abdomen 01/27/2016. FINDINGS: Lower chest: Small bilateral pleural effusions lying dependently. Hepatobiliary: No  discrete cystic or solid hepatic lesions are noted. MRCP demonstrates no intra or extrahepatic biliary ductal dilatation. Common bile duct measures 4 mm in the porta hepatis. No filling defect in the common bile duct to suggest choledocholithiasis. Status post cholecystectomy. Pancreas: In the inferior aspect of the pancreatic head there is a 10 x 8 mm lesion (image 37 of series 17) which is isointense on T1 weighted images, slightly T2 hyperintense, and does not appear to enhance, new compared to prior examination, favored to represent a small pancreatic pseudocyst. No other definite pancreatic mass identified. MRCP images demonstrate no pancreatic ductal dilatation. The main pancreatic duct is partially obliterated as it traverses through the pancreatic head on MRCP and additional T2 weighted sequences, however, given the lack of main pancreatic ductal dilatation, this is presumably related to some mild mass effect from adjacent pancreatic head fluid collection and other adjacent inflammatory changes, rather than an obstructing lesion. There are inflammatory changes adjacent to the pancreatic head and duodenum, where there is increased T2 signal intensity throughout the associated retroperitoneal fat, as well as multiple abnormal fluid collections (discussed below). Spleen:  Unremarkable. Adrenals/Urinary Tract: Bilateral kidneys and bilateral adrenal glands are normal in appearance. Mild bilateral perinephric stranding (nonspecific). No hydroureteronephrosis in the visualized portions of the abdomen. Stomach/Bowel: The appearance of the proximal stomach is normal. In the region of the antrum there are multiple T1 hypointense, T2 hyperintense lesions associated with the distal stomach and proximal duodenum which demonstrate some progressive enhancement of their walls, presumably multiple pancreatic pseudocysts. The largest of these is adjacent to the pylorus  measuring up to 2.5 x 4.2 cm (image 14 of series 22),  with an additional lesion inferior and medial to this measuring up to 1.7 x 2.7 cm. Several other smaller lesions are also noted in the region of the gastrohepatic ligament, also likely to represent multiple tiny pancreatic pseudocysts. All of these are surrounded areas of increased T2 signal intensity indicative of inflammation. Vascular/Lymphatic: Atherosclerosis in the abdominal aorta, without evidence of aneurysm in the visualized abdominal vasculature. No lymphadenopathy noted in the abdomen. Other: Trace volume of free fluid adjacent to the pancreatic head and proximal duodenum. Musculoskeletal: No aggressive osseous lesions are noted in the visualized portions of the skeleton. IMPRESSION: 1. Today's study again demonstrates inflammatory changes adjacent to the pancreatic head, proximal duodenum (first and second portions of the duodenum), and the gastric antrum, presumably sequela of pancreatitis with multiple pancreatic pseudocysts in the head of the pancreas, gastrohepatic ligament, and in the superior aspect of the wall of the antrum of the stomach, as detailed above. 2. No definite pancreatic mass noted. No signs of pancreatic ductal dilatation or common bile duct dilatation. 3. No evidence of choledocholithiasis. 4. Aortic atherosclerosis. Electronically Signed   By: Vinnie Langton M.D.   On: 02/26/2016 11:31   Mr Abdomen Mrcp Moise Boring Contast  Result Date: 02/26/2016 CLINICAL DATA:  75 year old female with history of possible pancreatic head mass. Nausea, vomiting and mild abdominal pain. EXAM: MRI ABDOMEN WITHOUT AND WITH CONTRAST (INCLUDING MRCP) TECHNIQUE: Multiplanar multisequence MR imaging of the abdomen was performed both before and after the administration of intravenous contrast. Heavily T2-weighted images of the biliary and pancreatic ducts were obtained, and three-dimensional MRCP images were rendered by post processing. CONTRAST:  103mL MULTIHANCE GADOBENATE DIMEGLUMINE 529 MG/ML IV SOLN  COMPARISON:  MRI of the abdomen 01/27/2016. FINDINGS: Lower chest: Small bilateral pleural effusions lying dependently. Hepatobiliary: No discrete cystic or solid hepatic lesions are noted. MRCP demonstrates no intra or extrahepatic biliary ductal dilatation. Common bile duct measures 4 mm in the porta hepatis. No filling defect in the common bile duct to suggest choledocholithiasis. Status post cholecystectomy. Pancreas: In the inferior aspect of the pancreatic head there is a 10 x 8 mm lesion (image 37 of series 17) which is isointense on T1 weighted images, slightly T2 hyperintense, and does not appear to enhance, new compared to prior examination, favored to represent a small pancreatic pseudocyst. No other definite pancreatic mass identified. MRCP images demonstrate no pancreatic ductal dilatation. The main pancreatic duct is partially obliterated as it traverses through the pancreatic head on MRCP and additional T2 weighted sequences, however, given the lack of main pancreatic ductal dilatation, this is presumably related to some mild mass effect from adjacent pancreatic head fluid collection and other adjacent inflammatory changes, rather than an obstructing lesion. There are inflammatory changes adjacent to the pancreatic head and duodenum, where there is increased T2 signal intensity throughout the associated retroperitoneal fat, as well as multiple abnormal fluid collections (discussed below). Spleen:  Unremarkable. Adrenals/Urinary Tract: Bilateral kidneys and bilateral adrenal glands are normal in appearance. Mild bilateral perinephric stranding (nonspecific). No hydroureteronephrosis in the visualized portions of the abdomen. Stomach/Bowel: The appearance of the proximal stomach is normal. In the region of the antrum there are multiple T1 hypointense, T2 hyperintense lesions associated with the distal stomach and proximal duodenum which demonstrate some progressive enhancement of their walls, presumably  multiple pancreatic pseudocysts. The largest of these is adjacent to the pylorus measuring up to 2.5 x 4.2 cm (image  14 of series 22), with an additional lesion inferior and medial to this measuring up to 1.7 x 2.7 cm. Several other smaller lesions are also noted in the region of the gastrohepatic ligament, also likely to represent multiple tiny pancreatic pseudocysts. All of these are surrounded areas of increased T2 signal intensity indicative of inflammation. Vascular/Lymphatic: Atherosclerosis in the abdominal aorta, without evidence of aneurysm in the visualized abdominal vasculature. No lymphadenopathy noted in the abdomen. Other: Trace volume of free fluid adjacent to the pancreatic head and proximal duodenum. Musculoskeletal: No aggressive osseous lesions are noted in the visualized portions of the skeleton. IMPRESSION: 1. Today's study again demonstrates inflammatory changes adjacent to the pancreatic head, proximal duodenum (first and second portions of the duodenum), and the gastric antrum, presumably sequela of pancreatitis with multiple pancreatic pseudocysts in the head of the pancreas, gastrohepatic ligament, and in the superior aspect of the wall of the antrum of the stomach, as detailed above. 2. No definite pancreatic mass noted. No signs of pancreatic ductal dilatation or common bile duct dilatation. 3. No evidence of choledocholithiasis. 4. Aortic atherosclerosis. Electronically Signed   By: Vinnie Langton M.D.   On: 02/26/2016 11:31    Anti-infectives: Anti-infectives    None      Assessment/Plan: Pancreatitis/ pancreatic pseudocyst. MRI personally reviewed , three Pseudo cyst, inflammatory changes head of pancreas, No evidence of necrotizing pancreatitis or malignancy. Recommend continuation of symptomatic Rx No need for emergent surgical intervention at this time continue to follow  Caroleen Hamman, MD, [facility]  02/26/2016

## 2016-02-26 NOTE — Consult Note (Signed)
Elizabeth Bellows Elizabeth Bailey  [address] [place], [place] [address] Phone: [phone] Fax : [phone]  Consultation  Referring Provider:     No ref. provider found Primary Care Physician:  Elizabeth Doe, Elizabeth Bailey Primary Gastroenterologist:  Dr. Vicente Males         Reason for Consultation:     Pancreatic pseudocyst  Date of Admission:  02/24/2016 Date of Consultation:  02/26/2016         HPI:   Elizabeth Bailey is a 75 y.o. female was initially admitted on 01/24/16 and a CT scan showed concern for possible duodenal perforation . She underwent exploratory laparotomy , no perforation was found but there was concern for possible pancreatic head mass which was biopsied . The biopsy was negative for malignancy but revealed a cyst which could have been a possible pancreatic cyst vs a duodenal diverticulum . She was discharged on 01/31/16 . A JP drain was placed. When evaluated as an outpatient was set up for an EGD and EUS with Dr Theora Master on 03/12/16 .  She was subsequently readmitted on 02/24/16 with nausea/vomiting and abdominal pain . She underwent a non contrasted CT scan as she could not tolerate the contrast. 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 No definite pancreatic mass seen.   Lipase on admission normal. AST/ALT normal , creatinine on admission 1.3  She denies any prior history of pancreatitis, no family history of pancreatitis, no new medications or herbal supplements. Since coming into hospital she feels much better and has ben tolerating ginger ale.   Past Medical History:  Diagnosis Date  . Arthritis   . Asthma   . Chronic bronchitis ([place])   . Collagen vascular disease ([place])   . Depression   . Diverticulosis   . GERD (gastroesophageal reflux disease)   .  High cholesterol   . Hypertension   . Seasonal allergies     Past Surgical History:  Procedure Laterality Date  . ABDOMINAL HYSTERECTOMY    . BREAST BIOPSY  1970's  . COLONOSCOPY  2005  . DG  BONE DENSITY ([place] [facility])  2010  . DIAGNOSTIC MAMMOGRAM  2015  . GALLBLADDER SURGERY    . PANCREAS SURGERY    . pap smear  2014  . REPAIR OF PERFORATED ULCER N/A 01/24/2016   Procedure: Exploratory Laparotomy with biopsy of Pancreas;  Surgeon: Elizabeth Glen, Elizabeth Bailey;  Location: [facility] ORS;  Service: General;  Laterality: N/A;    Prior to Admission medications   Medication Sig Start Date End Date Taking? Authorizing Provider  Adalimumab (HUMIRA PEN) 40 MG/0.8ML PNKT Inject into the skin. 12/06/14  Yes Historical Provider, Elizabeth Bailey  amLODipine (NORVASC) 5 MG tablet TAKE 1 TABLET BY MOUTH ONCE DAILY. 11/29/15  Yes Elizabeth Porta., Elizabeth Bailey  bisacodyl (DULCOLAX) 10 MG suppository Place 1 suppository (10 mg total) rectally daily as needed for moderate constipation. 01/31/16  Yes Elizabeth Ree, Elizabeth Bailey  budesonide-formoterol (SYMBICORT) 160-4.5 MCG/ACT inhaler Inhale 1 puff into the lungs 2 (two) times daily. 12/05/15  Yes Elizabeth Porta., Elizabeth Bailey  calcium-vitamin D (CALCIUM 500/D) 500-200 MG-UNIT tablet Take 1 tablet by mouth daily.   Yes Historical Provider, Elizabeth Bailey  escitalopram (LEXAPRO) 20 MG tablet Take 1 tablet daily 09/10/15  Yes Elizabeth Porta.,  Elizabeth Bailey  feeding supplement (BOOST / RESOURCE BREEZE) LIQD Take 1 Container by mouth 3 (three) times daily between meals. 01/31/16  Yes Elizabeth Ree, Elizabeth Bailey  fluticasone (FLONASE) 50 MCG/ACT nasal spray Place 2 sprays into the nose daily as needed.   Yes Historical Provider, Elizabeth Bailey  folic acid (FOLVITE) 1 MG tablet Take 1 tablet by mouth daily.   Yes Historical Provider, Elizabeth Bailey  HYDROcodone-acetaminophen (NORCO/VICODIN) 5-325 MG tablet Take 1 tablet by mouth daily as needed. 11/28/15  Yes Historical Provider, Elizabeth Bailey  losartan-hydrochlorothiazide (HYZAAR) 100-25 MG tablet TAKE 1 TABLET BY MOUTH ONCE  DAILY. 11/29/15  Yes Elizabeth Porta., Elizabeth Bailey  megestrol (MEGACE) 20 MG tablet Take 1 tablet (20 mg total) by mouth 2 (two) times daily. 02/20/16 03/21/16 Yes Elizabeth Ree, Elizabeth Bailey  Melatonin 10 MG TABS Take 2 tablets by mouth at bedtime. Reported on 06/03/2015   Yes Historical Provider, Elizabeth Bailey  montelukast (SINGULAIR) 10 MG tablet TAKE 1 TABLET BY MOUTH ONCE DAILY. 11/29/15  Yes Elizabeth Porta., Elizabeth Bailey  Multiple Vitamin (MULTI-VITAMINS) TABS Take 1 tablet by mouth daily.   Yes Historical Provider, Elizabeth Bailey  Naproxen Sod-Diphenhydramine (ALEVE PM) 220-25 MG TABS Take by mouth.   Yes Historical Provider, Elizabeth Bailey  Omega 3 1000 MG CAPS Take 1,000 mg by mouth. omega-3 fatty acids-vitamin E (FISH OIL) 1,000 mg   Yes Historical Provider, Elizabeth Bailey  pantoprazole (PROTONIX) 40 MG tablet Take 1 tablet (40 mg total) by mouth daily. 01/31/16 03/01/16 Yes Elizabeth Piscoya, Elizabeth Bailey  polyethylene glycol (MIRALAX / GLYCOLAX) packet Take 17 g by mouth daily. 01/31/16  Yes Elizabeth Ree, Elizabeth Bailey  simvastatin (ZOCOR) 20 MG tablet Take 1 tablet every other night. Patient taking differently: Take 20 mg by mouth every other day. Take 1 tablet every night. 01/09/15  Yes Elizabeth Porta., Elizabeth Bailey    Family History  Problem Relation Age of Onset  . Pneumonia Mother   . Depression Mother   . Depression Sister   . Breast cancer Sister 16  . Diabetes Sister   . Stroke Brother   . Heart disease Brother      Social History  Substance Use Topics  . Smoking status: Former Smoker    Packs/day: 0.50    Years: 10.00    Types: Cigarettes  . Smokeless tobacco: Never Used     Comment: quit 1988  . Alcohol use 0.0 oz/week     Comment: wine twice a month     Allergies as of 02/24/2016 - Review Complete 02/24/2016  Allergen Reaction Noted  . Cyclobenzaprine  03/28/2015  . Flucytosine Other (See Comments) 11/28/2014  . Fluticasone-salmeterol Other (See Comments) 03/28/2015  . Naproxen Nausea Only 11/28/2014  . Other Other (See Comments) 11/28/2014    Review of  Systems:    All systems reviewed and negative except where noted in HPI.   Physical Exam:  Vital signs in last 24 hours: Temp:  [98.3 F (36.8 C)-99.3 F (37.4 C)] 98.3 F (36.8 C) (12/27 0442) Pulse Rate:  [63-83] 83 (12/27 0442) Resp:  [16-20] 20 (12/27 0442) BP: (105-156)/(55-70) 156/70 (12/27 0442) SpO2:  [97 %-99 %] 97 % (12/27 0442) Last BM Date: 02/24/16 General:   Pleasant, cooperative in NAD Head:  Normocephalic and atraumatic. Eyes:   No icterus.   Conjunctiva pink. PERRLA. Ears:  Normal auditory acuity. Neck:  Supple; no masses or thyroidomegaly Lungs: Respirations even and unlabored. Lungs clear to auscultation bilaterally.   No wheezes, crackles, or rhonchi.  Heart:  Regular rate and rhythm;  Without murmur, clicks, rubs or gallops Abdomen:  Soft, nondistended, nontender. Normal bowel sounds. No appreciable masses or hepatomegaly.  No rebound or guarding.  Rectal:  Not performed. Neurologic:  Alert and oriented x3;  grossly normal neurologically. Psych:  Alert and cooperative. Normal affect.  LAB RESULTS:  Recent Labs  02/24/16 1025 02/25/16 0440  WBC 12.5* 11.4*  HGB 11.5* 9.3*  HCT 32.9* 27.3*  PLT 504* 430   BMET  Recent Labs  02/24/16 1025 02/25/16 0440  NA 130* 134*  K 3.1* 3.1*  CL 89* 100*  CO2 25 28  GLUCOSE 139* 121*  BUN 16 13  CREATININE 1.30* 0.95  CALCIUM 9.7 8.4*   LFT  Recent Labs  02/25/16 0440  PROT 6.7  ALBUMIN 2.5*  AST 21  ALT 11*  ALKPHOS 59  BILITOT 0.9   PT/INR No results for input(s): LABPROT, INR in the last 72 hours.  STUDIES: Dg Chest 1 View  Result Date: 02/24/2016 CLINICAL DATA:  Hemoptysis with nausea and vomiting since yesterday. EXAM: CHEST 1 VIEW COMPARISON:  01/24/2016 FINDINGS: Lungs are adequately inflated without consolidation or effusion. Cardiomediastinal silhouette is within normal. There is calcified plaque over the thoracic aorta. There are mild degenerative changes of the spine. IMPRESSION:  No acute cardiopulmonary disease. Electronically Signed   By: Marin Olp M.D.   On: 02/24/2016 10:32   Ct Abdomen Pelvis W Contrast  Result Date: 02/24/2016 CLINICAL DATA:  Flank pain, nausea, vomiting and arm opt assist since yesterday. EXAM: CT ABDOMEN AND PELVIS WITH CONTRAST TECHNIQUE: Multidetector CT imaging of the abdomen and pelvis was performed using the standard protocol following bolus administration of intravenous contrast. CONTRAST:  75 ml ISOVUE-300 IOPAMIDOL (ISOVUE-300) INJECTION 61% COMPARISON:  MRI abdomen 01/27/2016. CT chest, abdomen and pelvis 01/24/2016. FINDINGS: Lower chest: Emphysematous disease is seen in the lung bases. Right lower lobe pulmonary nodule on image 7 measuring 0.9 cm is unchanged since the prior CT. Lung bases are otherwise clear. Small hiatal hernia noted. No pleural or pericardial effusion. Hepatobiliary: Status post cholecystectomy. The liver appears normal. Biliary tree is unremarkable Pancreas: A new fluid collection is seen in the uncinate process the pancreas measuring 0.9 x 1.1 cm on image 31. Additionally, 2 other fluid collections are new since the prior exams. Collection on the right abuts the the undersurface of the right hepatic lobe and first portion of the duodenum measuring 3.5 x 2.7 x 4.2 cm craniocaudal. More central collection measures 4.5 x 2.2 x 3.9 cm craniocaudal and abuts the posterior aspect of body of the stomach. The remainder of the pancreas is unremarkable. There is no pancreatic ductal dilatation. Spleen: Negative. Adrenals/Urinary Tract: Negative. Stomach/Bowel: Extensive stranding is present about the first and second portions of the duodenum and there is wall thickening about these structures. Small bowel is otherwise unremarkable. Extensive sigmoid diverticulosis is noted. Wall thickening about the sigmoid is unchanged. The colon is otherwise unremarkable. The appendix appears normal. Vascular/Lymphatic: Extensive aortoiliac  atherosclerosis without aneurysm. Reproductive: Status post hysterectomy. Other: No fracture or worrisome lesion. Musculoskeletal: None. IMPRESSION: Findings most consistent with duodenitis about the first and second portions of the duodenum. Three new fluid collections are identified as described above which could be abscesses or pseudocysts. Note is made that the patient's lipase is normal. Endoscopy could be used for further evaluation. Sigmoid diverticulosis with chronic wall thickening compatible with diverticulitis. Extensive atherosclerosis. No change in a 0.9 cm right lower lobe pulmonary nodule. Emphysema. Electronically Signed   By: Marcello Moores  Dalessio M.D.   On: 02/24/2016 12:18      Impression / Plan:   MAIDIE GERK is a 75 y.o. y/o female with a recent admission for abdominal pain when there was suspicion for a perforated duodenum.Ex lap showed no perforation but there was concern for a pancreatic head mass, biopsy showed no maliganncy but possibly a ruptured cyst vs diverticulum. She has been readmitted with abdominal pain . MRI shows no pancreatic head mass but multiple pseudocysts which are usually a sequelae of pancreatitis. No other biliary abnormalities are seen .  Plan   1. Usually pancreatic pseuodcysts resolve in a few weeks which can be followed with repeated imaging in 4-6 weeks time .  2. Once the pseudocysts have resolved , an EUS may be beneficial to evaluate the pancreatic head , rule out pancreatic divisum.  3. No clear cause for pancreatitis at this point . Unclear the events which led to these changes. Humira has very rarely been associated with pancreatitis. 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  4. Cant see a strong reason for an EGD which would alter management at this point. I will touch base with the surgeons tomorrow  , if required can be performed once her electrolytes have been replenished.   5. Since she is feeling better can consider to advance her diet .   Thank you for involving me in the care of this patient.      LOS: 2 days   Elizabeth Bellows, Elizabeth Bailey  02/26/2016, 8:34 AM

## 2016-02-26 NOTE — Progress Notes (Signed)
Initial Nutrition Assessment  DOCUMENTATION CODES:   Non-severe (moderate) malnutrition in context of chronic illness  INTERVENTION:   Prostat SF TID- Each serving provides 100kcal and 15g protein  Order Ensure Enlive po BID once diet advanced, each supplement provides 350 kcal and 20 grams of protein  NUTRITION DIAGNOSIS:   Malnutrition related to chronic illness, vomiting, nausea, poor appetite as evidenced by moderate depletions of muscle mass, moderate depletion of body fat, percent weight loss.  GOAL:   Patient will meet greater than or equal to 90% of their needs  MONITOR:   PO intake, Supplement acceptance  REASON FOR ASSESSMENT:   Malnutrition Screening Tool    ASSESSMENT:   75 y.o. female with a known history of Asthma, chronic bronchitis, collagen vascular disease, depression, diverticulosis, high cholesterol, hypertension who has pancreatic mass that was biopsied and returned as inflammatory. Admitted with likely pancreatitis and pancreatic pseudocyst causing her pain. The etiology of them are not clear however and the suspicion for malignancy is still quite high in spite of benign prior biopsies.   Met with pt in room today. Pt reports poor appetite and weight loss since Thanksgiving. Pt reports intermittent N/V pta but denies any N/V today. Pt currently eating 25% CL diet. Pt has lost 16lbs(11%) in three weeks. This is considered severe. Pt with low electrolytes; has been drinking Gatorade per MD request. Pt likes Ensure (SB and choc). Pt currently on CL diet. Will order Prostat for now and change to Ensure once diet advanced.    Medications reviewed and include: heparin, megace, protonix  Labs reviewed: Na 134(L), K 3.1(L), Cl 100(L), Ca 8.4(L) adj. 9.6 wnl, Alb 2.5(L) Wbc- 11.4(H), Hgb 9.3(L), Hct 27.3   Nutrition-Focused physical exam completed. Findings are moderate fat depletion, moderate muscle depletion, and no edema.   Diet Order:  Diet clear liquid Room  service appropriate? Yes; Fluid consistency: Thin  Skin:  Reviewed, no issues  Last BM:  12/25  Height:   Ht Readings from Last 1 Encounters:  02/24/16 _0  (1.575 m)    Weight:   Wt Readings from Last 1 Encounters:  02/24/16 130 lb 9.6 oz (59.2 kg)    Ideal Body Weight:  50 kg  BMI:  Body mass index is 23.89 kg/m.  Estimated Nutritional Needs:   Kcal:  1300-1600kcal/day   Protein:  65-77g/day   Fluid:  >1.3L/day   EDUCATION NEEDS:   No education needs identified at this time  Elizabeth Bailey, RD, LDN Pager #[phone]

## 2016-02-27 ENCOUNTER — Telehealth: Payer: Self-pay

## 2016-02-27 LAB — BASIC METABOLIC PANEL
Anion gap: 6 (ref 5–15)
BUN: <5 mg/dL — ABNORMAL LOW (ref 6–20)
CHLORIDE: 105 mmol/L (ref 101–111)
CO2: 24 mmol/L (ref 22–32)
Calcium: 8.3 mg/dL — ABNORMAL LOW (ref 8.9–10.3)
Creatinine, Ser: 0.64 mg/dL (ref 0.44–1.00)
GFR calc non Af Amer: >60 mL/min (ref >60)
Glucose, Bld: 111 mg/dL — ABNORMAL HIGH (ref 65–99)
POTASSIUM: 3.1 mmol/L — AB (ref 3.5–5.1)
SODIUM: 135 mmol/L (ref 135–145)

## 2016-02-27 LAB — CBC
HEMATOCRIT: 27.6 % — AB (ref 35.0–47.0)
HEMOGLOBIN: 9.5 g/dL — AB (ref 12.0–16.0)
MCH: 32.6 pg (ref 26.0–34.0)
MCHC: 34.4 g/dL (ref 32.0–36.0)
MCV: 94.7 fL (ref 80.0–100.0)
Platelets: 384 10*3/uL (ref 150–440)
RBC: 2.91 MIL/uL — AB (ref 3.80–5.20)
RDW: 13 % (ref 11.5–14.5)
WBC: 7.2 10*3/uL (ref 3.6–11.0)

## 2016-02-27 LAB — MAGNESIUM: Magnesium: 1.3 mg/dL — ABNORMAL LOW (ref 1.7–2.4)

## 2016-02-27 MED ORDER — HYDROMORPHONE HCL 1 MG/ML IJ SOLN: 0.5000 mg | INTRAMUSCULAR | Status: DC | PRN

## 2016-02-27 MED ORDER — ENSURE ENLIVE PO LIQD
237.0000 mL | Freq: Two times a day (BID) | ORAL | Status: DC
  Administered 2016-02-29 (×2): 237 mL via ORAL

## 2016-02-27 MED ORDER — POTASSIUM CHLORIDE CRYS ER 20 MEQ PO TBCR
40.0000 meq | EXTENDED_RELEASE_TABLET | Freq: Once | ORAL | Status: AC
  Administered 2016-02-27: 40 meq via ORAL
  Filled 2016-02-27: qty 2

## 2016-02-27 MED ORDER — ACETAMINOPHEN 325 MG PO TABS: 650.0000 mg | ORAL_TABLET | Freq: Four times a day (QID) | ORAL | Status: DC | PRN

## 2016-02-27 MED ORDER — KETOROLAC TROMETHAMINE 15 MG/ML IJ SOLN
15.0000 mg | Freq: Four times a day (QID) | INTRAMUSCULAR | Status: DC | PRN
  Administered 2016-02-28 (×2): 15 mg via INTRAVENOUS
  Filled 2016-02-27 (×2): qty 1

## 2016-02-27 NOTE — Progress Notes (Signed)
75 yr old female with pancreatitis and pseudocysts.  Patient states that she has tolerated some clear liquids and has not had pain in her epigastrium today. Patient states some crampy pain in her left lower quadrant. Patient did have a bowel movement earlier today but does not associate the left lower quadrant pain with this.  Vitals:   02/27/16 0800 02/27/16 1350  BP: (!) 146/64 (!) 125/50  Pulse: 63 69  Resp: 20 19  Temp: 97.7 F (36.5 C) 98 F (36.7 C)   I/O last 3 completed shifts: In: 3267 [P.O.:120; I.V.:3147] Out: 350 [Urine:350] Total I/O In: 440 [P.O.:120; I.V.:320] Out: 0    PE:  Gen: NAD Abd: soft, nontender in epigastrium, minimal tenderness in LLQ, well healed scar Ext: no edema  CBC Latest Ref Rng & Units 02/27/2016 02/25/2016 02/24/2016  WBC 3.6 - 11.0 K/uL 7.2 11.4(H) 12.5(H)  Hemoglobin 12.0 - 16.0 g/dL 9.5(L) 9.3(L) 11.5(L)  Hematocrit 35.0 - 47.0 % 27.6(L) 27.3(L) 32.9(L)  Platelets 150 - 440 K/uL 384 430 504(H)   CMP Latest Ref Rng & Units 02/27/2016 02/25/2016 02/24/2016  Glucose 65 - 99 mg/dL 111(H) 121(H) 139(H)  BUN 6 - 20 mg/dL <5(L) 13 16  Creatinine 0.44 - 1.00 mg/dL 0.64 0.95 1.30(H)  Sodium 135 - 145 mmol/L 135 134(L) 130(L)  Potassium 3.5 - 5.1 mmol/L 3.1(L) 3.1(L) 3.1(L)  Chloride 101 - 111 mmol/L 105 100(L) 89(L)  CO2 22 - 32 mmol/L 24 28 25   Calcium 8.9 - 10.3 mg/dL 8.3(L) 8.4(L) 9.7  Total Protein 6.5 - 8.1 g/dL - 6.7 8.6(H)  Total Bilirubin 0.3 - 1.2 mg/dL - 0.9 1.5(H)  Alkaline Phos 38 - 126 U/L - 59 78  AST 15 - 41 U/L - 21 36  ALT 14 - 54 U/L - 11(L) 15   A/P:  75 yr old female with pancreatitis and pseudocysts, MRI without any masses seen although there was some concern for malignancy with potential palpable mass in OR with my partner Dr. Burt Knack. Appreciate input from GI, Dr. Wilhemena Durie who recommends EUS in about 6weeks when psuedopcysts have resolved.   Advance diet to full liquids with ensure since no longer having epigastric pain.

## 2016-02-27 NOTE — Progress Notes (Signed)
Moody Bruins RN reported at shift change, that staff had attempted multiple times to insert NGT without success; was told to not attempt to reinsert. Barbaraann Faster, RN  02/27/2016 1:10 AM

## 2016-02-27 NOTE — Progress Notes (Signed)
[place] [facility] [place] NAME: Shellia Kubina    MR#:  [id number]  DATE OF BIRTH:  [birth year]  SUBJECTIVE:  CHIEF COMPLAINT:   Chief Complaint  Patient presents with  . Emesis  . Hemoptysis  Feeling better, taking clears, no Emesis.  REVIEW OF SYSTEMS:  Review of Systems  Constitutional: Negative for chills, fever and weight loss.  HENT: Negative for nosebleeds and sore throat.   Eyes: Negative for blurred vision.  Respiratory: Negative for cough, shortness of breath and wheezing.   Cardiovascular: Negative for chest pain, orthopnea, leg swelling and PND.  Gastrointestinal: Positive for nausea. Negative for constipation, diarrhea and heartburn.  Genitourinary: Negative for dysuria and urgency.  Musculoskeletal: Negative for back pain.  Skin: Negative for rash.  Neurological: Negative for dizziness, speech change, focal weakness and headaches.  Endo/Heme/Allergies: Does not bruise/bleed easily.  Psychiatric/Behavioral: Negative for depression.   DRUG ALLERGIES:   Allergies  Allergen Reactions  . Cyclobenzaprine     Other reaction(s): Other (See Comments) Frequent urination  . Flucytosine Other (See Comments)    Sore mouth  . Fluticasone-Salmeterol Other (See Comments)    Other reaction(s): Other (See Comments) Sore mouth  . Naproxen Nausea Only    GI upset  . Other Other (See Comments)    Other reaction(s): Other (See Comments) Frequent urination Other reaction(s): Other (See Comments) Frequent urination Frequent urination   VITALS:  Blood pressure (!) 125/50, pulse 69, temperature 98 F (36.7 C), temperature source Oral, resp. rate 19, height 5\' 2"  (1.575 m), weight 59.2 kg (130 lb 9.6 oz), SpO2 97 %. PHYSICAL EXAMINATION:  Physical Exam  Constitutional: She is oriented to person, place, and time and well-developed, well-nourished, and in no distress.  HENT:  Head: Normocephalic and atraumatic.  Eyes: Conjunctivae and EOM  are normal. Pupils are equal, round, and reactive to light.  Neck: Normal range of motion. Neck supple. No tracheal deviation present. No thyromegaly present.  Cardiovascular: Normal rate, regular rhythm and normal heart sounds.   Pulmonary/Chest: Effort normal and breath sounds normal. No respiratory distress. She has no wheezes. She exhibits no tenderness.  Abdominal: Soft. Bowel sounds are normal. She exhibits no distension. There is no tenderness.  Musculoskeletal: Normal range of motion.  Neurological: She is alert and oriented to person, place, and time. No cranial nerve deficit.  Skin: Skin is warm and dry. No rash noted.  Psychiatric: Mood and affect normal.   LABORATORY PANEL:   CBC  Recent Labs Lab 02/27/16 0442  WBC 7.2  HGB 9.5*  HCT 27.6*  PLT 384   ------------------------------------------------------------------------------------------------------------------ Chemistries   Recent Labs Lab 02/25/16 0440 02/26/16 0440 02/27/16 0442  NA 134*  --  135  K 3.1*  --  3.1*  CL 100*  --  105  CO2 28  --  24  GLUCOSE 121*  --  111*  BUN 13  --  <5*  CREATININE 0.95  --  0.64  CALCIUM 8.4*  --  8.3*  MG  --  1.3*  --   AST 21  --   --   ALT 11*  --   --   ALKPHOS 59  --   --   BILITOT 0.9  --   --    RADIOLOGY:  No results found. ASSESSMENT AND PLAN:  * hypokalemia  - replete and recheck - Check Magnesium  * Duodenitis  - continue IV fluids and PRN nausea meds. - Appreciate GI  input - Conservative management for now.  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  * COPD   No exacerbation, Cont inhalers.   Discussed with surgicalist   All the records are reviewed and case discussed with Care Management/Social Worker. Management  plans discussed with the patient, nursing and they are in agreement.  CODE STATUS: FULL CODE  TOTAL TIME TAKING CARE OF THIS PATIENT: 15 minutes.   More than 50% of the time was spent in counseling/coordination of care: YES   Max Sane M.D on 02/27/2016 at 5:26 PM  Between 7am to 6pm - Pager - [phone]  After 6pm go to [email or web address] - Proofreader  [facility]  Office  [phone]  CC: Primary care physician; Dicky Doe, MD  Note: This dictation was prepared with Dragon dictation along with smaller phrase technology. Any transcriptional errors that result from this process are unintentional.

## 2016-02-27 NOTE — Telephone Encounter (Signed)
  Oncology Nurse Navigator Documentation Atempted to try to educate Elizabeth Bailey further on upcoming EUS. Noted that she is inpatient. Navigator Location: [facility] (02/27/16 1400)   )Navigator Encounter Type: Education (02/27/16 1400)                                 Coordination of Care: EUS (02/27/16 1400)                  Time Spent with Patient: 15 (02/27/16 1400)

## 2016-02-28 LAB — BASIC METABOLIC PANEL
Anion gap: 6 (ref 5–15)
CHLORIDE: 109 mmol/L (ref 101–111)
CO2: 23 mmol/L (ref 22–32)
CREATININE: 0.7 mg/dL (ref 0.44–1.00)
Calcium: 8.4 mg/dL — ABNORMAL LOW (ref 8.9–10.3)
GFR calc Af Amer: >60 mL/min (ref >60)
GFR calc non Af Amer: >60 mL/min (ref >60)
GLUCOSE: 107 mg/dL — AB (ref 65–99)
Potassium: 3.7 mmol/L (ref 3.5–5.1)
SODIUM: 138 mmol/L (ref 135–145)

## 2016-02-28 LAB — CBC
HCT: 30.7 % — ABNORMAL LOW (ref 35.0–47.0)
HEMOGLOBIN: 10.7 g/dL — AB (ref 12.0–16.0)
MCH: 32.5 pg (ref 26.0–34.0)
MCHC: 34.8 g/dL (ref 32.0–36.0)
MCV: 93.3 fL (ref 80.0–100.0)
Platelets: 438 10*3/uL (ref 150–440)
RBC: 3.29 MIL/uL — ABNORMAL LOW (ref 3.80–5.20)
RDW: 12.9 % (ref 11.5–14.5)
WBC: 6.5 10*3/uL (ref 3.6–11.0)

## 2016-02-28 LAB — MAGNESIUM: MAGNESIUM: 1.2 mg/dL — AB (ref 1.7–2.4)

## 2016-02-28 MED ORDER — MAGNESIUM SULFATE 4 GM/100ML IV SOLN
4.0000 g | Freq: Once | INTRAVENOUS | Status: AC
  Administered 2016-02-28: 4 g via INTRAVENOUS
  Filled 2016-02-28: qty 100

## 2016-02-28 NOTE — Progress Notes (Addendum)
[facility] [place] NAME: Elizabeth Bailey    MR#:  [id number]  DATE OF BIRTH:  [birth year]  SUBJECTIVE:  CHIEF COMPLAINT:   Chief Complaint  Patient presents with  . Emesis  . Hemoptysis  slowly improving. Tolerated FLD REVIEW OF SYSTEMS:  Review of Systems  Constitutional: Negative for chills, fever and weight loss.  HENT: Negative for nosebleeds and sore throat.   Eyes: Negative for blurred vision.  Respiratory: Negative for cough, shortness of breath and wheezing.   Cardiovascular: Negative for chest pain, orthopnea, leg swelling and PND.  Gastrointestinal: Negative for constipation, diarrhea and heartburn.  Genitourinary: Negative for dysuria and urgency.  Musculoskeletal: Negative for back pain.  Skin: Negative for rash.  Neurological: Negative for dizziness, speech change, focal weakness and headaches.  Endo/Heme/Allergies: Does not bruise/bleed easily.  Psychiatric/Behavioral: Negative for depression.   DRUG ALLERGIES:   Allergies  Allergen Reactions  . Cyclobenzaprine     Other reaction(s): Other (See Comments) Frequent urination  . Flucytosine Other (See Comments)    Sore mouth  . Fluticasone-Salmeterol Other (See Comments)    Other reaction(s): Other (See Comments) Sore mouth  . Naproxen Nausea Only    GI upset  . Other Other (See Comments)    Other reaction(s): Other (See Comments) Frequent urination Other reaction(s): Other (See Comments) Frequent urination Frequent urination   VITALS:  Blood pressure (!) 149/69, pulse 87, temperature 98.3 F (36.8 C), temperature source Oral, resp. rate 18, height 5\' 2"  (1.575 m), weight 59.2 kg (130 lb 9.6 oz), SpO2 97 %. PHYSICAL EXAMINATION:  Physical Exam  Constitutional: She is oriented to person, place, and time and well-developed, well-nourished, and in no distress.  HENT:  Head: Normocephalic and atraumatic.  Eyes: Conjunctivae and EOM are normal. Pupils are equal,  round, and reactive to light.  Neck: Normal range of motion. Neck supple. No tracheal deviation present. No thyromegaly present.  Cardiovascular: Normal rate, regular rhythm and normal heart sounds.   Pulmonary/Chest: Effort normal and breath sounds normal. No respiratory distress. She has no wheezes. She exhibits no tenderness.  Abdominal: Soft. Bowel sounds are normal. She exhibits no distension. There is no tenderness.  Musculoskeletal: Normal range of motion.  Neurological: She is alert and oriented to person, place, and time. No cranial nerve deficit.  Skin: Skin is warm and dry. No rash noted.  Psychiatric: Mood and affect normal.   LABORATORY PANEL:   CBC  Recent Labs Lab 02/28/16 0439  WBC 6.5  HGB 10.7*  HCT 30.7*  PLT 438   ------------------------------------------------------------------------------------------------------------------ Chemistries   Recent Labs Lab 02/25/16 0440  02/28/16 0439  NA 134*  < > 138  K 3.1*  < > 3.7  CL 100*  < > 109  CO2 28  < > 23  GLUCOSE 121*  < > 107*  BUN 13  < > <5*  CREATININE 0.95  < > 0.70  CALCIUM 8.4*  < > 8.4*  MG  --   < > 1.2*  AST 21  --   --   ALT 11*  --   --   ALKPHOS 59  --   --   BILITOT 0.9  --   --   < > = values in this interval not displayed. RADIOLOGY:  No results found. ASSESSMENT AND PLAN:  * hypokalemia  - repleted and resolved  * Hypomagnesemia - Replete and recheck  * Duodenitis - can D/C IVFs as she starts  taking PO (+ 6 Liters) - Appreciate GI input - Conservative management for now.  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  * COPD   No exacerbation, Cont inhalers.   Discussed with surgicalist Dr Azalee Course - will sign off, please call us if any questions.  D/C  TELE   All the records are reviewed and case discussed with Care Management/Social Worker. Management plans discussed with the patient, nursing and they are in agreement.  CODE STATUS: FULL CODE  TOTAL TIME TAKING CARE OF THIS PATIENT: 15 minutes.   More than 50% of the time was spent in counseling/coordination of care: YES   Max Sane M.D on 02/28/2016 at 9:11 AM  Between 7am to 6pm - Pager - [phone]  After 6pm go to [email or web address] - Proofreader  [facility]  Office  [phone]  CC: Primary care physician; Elizabeth Doe, MD  Note: This dictation was prepared with Dragon dictation along with smaller phrase technology. Any transcriptional errors that result from this process are unintentional.

## 2016-02-28 NOTE — Progress Notes (Signed)
75 yr old female with pancreatitis and pseudocysts.  Patient states that she has tolerated some full liquids, she drank some ensure.  She has not had any epigastric pain.  She would like to try some grits today.  She does still have some intermittent LLQ pain.  She is having some Bms.    Vitals:   02/28/16 0636 02/28/16 1321  BP:  128/61  Pulse: 87 (!) 111  Resp:  16  Temp:  98.3 F (36.8 C)   I/O last 3 completed shifts: In: 2576.1 [P.O.:120; I.V.:2456.1] Out: 800 [Urine:800] No intake/output data recorded.   PE:  Gen: NAD Abd: soft, nontender in epigastrium, minimal tenderness in LLQ, well healed scar Ext: no edema  CBC Latest Ref Rng & Units 02/28/2016 02/27/2016 02/25/2016  WBC 3.6 - 11.0 K/uL 6.5 7.2 11.4(H)  Hemoglobin 12.0 - 16.0 g/dL 10.7(L) 9.5(L) 9.3(L)  Hematocrit 35.0 - 47.0 % 30.7(L) 27.6(L) 27.3(L)  Platelets 150 - 440 K/uL 438 384 430   CMP Latest Ref Rng & Units 02/28/2016 02/27/2016 02/25/2016  Glucose 65 - 99 mg/dL 107(H) 111(H) 121(H)  BUN 6 - 20 mg/dL <5(L) <5(L) 13  Creatinine 0.44 - 1.00 mg/dL 0.70 0.64 0.95  Sodium 135 - 145 mmol/L 138 135 134(L)  Potassium 3.5 - 5.1 mmol/L 3.7 3.1(L) 3.1(L)  Chloride 101 - 111 mmol/L 109 105 100(L)  CO2 22 - 32 mmol/L 23 24 28   Calcium 8.9 - 10.3 mg/dL 8.4(L) 8.3(L) 8.4(L)  Total Protein 6.5 - 8.1 g/dL - - 6.7  Total Bilirubin 0.3 - 1.2 mg/dL - - 0.9  Alkaline Phos 38 - 126 U/L - - 59  AST 15 - 41 U/L - - 21  ALT 14 - 54 U/L - - 11(L)   A/P:  75 yr old female with pancreatitis and pseudocysts, MRI without any masses seen although there was some concern for malignancy with potential palpable mass in OR with my partner Dr. Burt Knack. Appreciate input from GI, Dr. Wilhemena Durie who recommends EUS in about 6weeks when psuedopcysts have resolved.   If does well with full liquids will give a regular diet this PM.  Likely home tomorrow

## 2016-02-28 NOTE — Care Management Important Message (Signed)
Important Message  Patient Details  Name: TMIA STRZELECKI MRN: [id number] Date of Birth: 01-19-1941   Medicare Important Message Given:  Yes    Beverly Sessions, RN 02/28/2016, 3:10 PM

## 2016-02-29 LAB — BASIC METABOLIC PANEL
ANION GAP: 8 (ref 5–15)
BUN: 8 mg/dL (ref 6–20)
CALCIUM: 8.6 mg/dL — AB (ref 8.9–10.3)
CO2: 21 mmol/L — ABNORMAL LOW (ref 22–32)
Chloride: 105 mmol/L (ref 101–111)
Creatinine, Ser: 0.67 mg/dL (ref 0.44–1.00)
Glucose, Bld: 103 mg/dL — ABNORMAL HIGH (ref 65–99)
POTASSIUM: 3.6 mmol/L (ref 3.5–5.1)
Sodium: 134 mmol/L — ABNORMAL LOW (ref 135–145)

## 2016-02-29 LAB — MAGNESIUM: MAGNESIUM: 1.7 mg/dL (ref 1.7–2.4)

## 2016-02-29 LAB — MISC LABCORP TEST (SEND OUT)
LABCORP TEST CODE: 1776
LABCORP TEST NAME: 1776

## 2016-02-29 MED ORDER — ONDANSETRON HCL 4 MG PO TABS: 4.0000 mg | ORAL_TABLET | Freq: Four times a day (QID) | ORAL | 0 refills | Status: DC | PRN

## 2016-02-29 MED ORDER — PRO-STAT SUGAR FREE PO LIQD: 30.0000 mL | Freq: Three times a day (TID) | ORAL | 0 refills | Status: DC

## 2016-02-29 NOTE — Progress Notes (Signed)
Patient feels ok and excited to be going home. Tolerating full liquids. Being discharged home and nurse reviewing her D/C instructions with family in room. Outpt EUS planned per Dr. Georgeann Oppenheim recs.

## 2016-02-29 NOTE — Care Management Note (Signed)
Case Management Note  Patient Details  Name: Elizabeth Bailey MRN: [id number] Date of Birth: 10-22-1940  Subjective/Objective:      Per Verne RN, Mrs Stagliano's son will pick her up from [facility] at approximately 2pm today and transport her home via private vehicle. Per Ms Hillmann, her son will be staying at her home with her.               Action/Plan:   Expected Discharge Date:                  Expected Discharge Plan:     In-House Referral:     Discharge planning Services     Post Acute Care Choice:    Choice offered to:     DME Arranged:    DME Agency:     HH Arranged:    HH Agency:     Status of Service:     If discussed at [employer] of Stay Meetings, dates discussed:    Additional Comments:  Ly Wass A, RN 02/29/2016, 12:13 PM

## 2016-02-29 NOTE — Discharge Summary (Signed)
Physician Discharge Summary  Patient ID: Elizabeth Bailey MRN: [id number] DOB/AGE: 04-06-1940 75 y.o.  Admit date: 02/24/2016 Discharge date: 02/29/2016   Discharge Diagnoses:  Principal Problem:   Duodenitis Active Problems:   Malnutrition of moderate degree   Procedures:none  Hospital Course: This patient with a history of probable pancreatitis who presented again to the hospital with signs of nausea and abdominal pain a workup suggested probable pancreatic pseudocyst 1 emanating from the uncinate process and the other paraduodenal GI consultation was obtained and no further workup was deemed necessary at this time. An MRI failed to identify any sign of malignancy and the etiology of her pancreatitis is not clear. She is tolerating a diet at this point with no further nausea or abdominal pain and will be discharged in stable condition to follow-up in our office at the end of the week. I have discussed this with the patient's son who lives in [place] but I will be available with the patient over the next 3 days. They want to forego transfer to a rehabilitation facility as the patient had just been discharged from a rehabilitation facility previously. They understand to return to the emergency room or to call our office should any problems occur.  Consults: IM, GI  Disposition: 03-Skilled Nursing Facility   Allergies as of 02/29/2016      Reactions   Cyclobenzaprine    Other reaction(s): Other (See Comments) Frequent urination   Flucytosine Other (See Comments)   Sore mouth   Fluticasone-salmeterol Other (See Comments)   Other reaction(s): Other (See Comments) Sore mouth   Naproxen Nausea Only   GI upset   Other Other (See Comments)   Other reaction(s): Other (See Comments) Frequent urination Other reaction(s): Other (See Comments) Frequent urination Frequent urination      Medication List    TAKE these medications   ALEVE PM 220-25 MG Tabs Generic drug:  Naproxen  Sod-Diphenhydramine Take by mouth.   amLODipine 5 MG tablet Commonly known as:  NORVASC TAKE 1 TABLET BY MOUTH ONCE DAILY.   bisacodyl 10 MG suppository Commonly known as:  DULCOLAX Place 1 suppository (10 mg total) rectally daily as needed for moderate constipation.   budesonide-formoterol 160-4.5 MCG/ACT inhaler Commonly known as:  SYMBICORT Inhale 1 puff into the lungs 2 (two) times daily.   CALCIUM 500/D 500-200 MG-UNIT tablet Generic drug:  calcium-vitamin D Take 1 tablet by mouth daily.   escitalopram 20 MG tablet Commonly known as:  LEXAPRO Take 1 tablet daily   feeding supplement (PRO-STAT SUGAR FREE 64) Liqd Take 30 mLs by mouth 3 (three) times daily.   feeding supplement Liqd Take 1 Container by mouth 3 (three) times daily between meals.   fluticasone 50 MCG/ACT nasal spray Commonly known as:  FLONASE Place 2 sprays into the nose daily as needed.   folic acid 1 MG tablet Commonly known as:  FOLVITE Take 1 tablet by mouth daily.   HUMIRA PEN 40 MG/0.8ML Pnkt Generic drug:  Adalimumab Inject into the skin.   HYDROcodone-acetaminophen 5-325 MG tablet Commonly known as:  NORCO/VICODIN Take 1 tablet by mouth daily as needed.   losartan-hydrochlorothiazide 100-25 MG tablet Commonly known as:  HYZAAR TAKE 1 TABLET BY MOUTH ONCE DAILY.   megestrol 20 MG tablet Commonly known as:  MEGACE Take 1 tablet (20 mg total) by mouth 2 (two) times daily.   Melatonin 10 MG Tabs Take 2 tablets by mouth at bedtime. Reported on 06/03/2015   montelukast 10 MG tablet Commonly known  as:  SINGULAIR TAKE 1 TABLET BY MOUTH ONCE DAILY.   MULTI-VITAMINS Tabs Take 1 tablet by mouth daily.   Omega 3 1000 MG Caps Take 1,000 mg by mouth. omega-3 fatty acids-vitamin E (FISH OIL) 1,000 mg   ondansetron 4 MG tablet Commonly known as:  ZOFRAN Take 1 tablet (4 mg total) by mouth every 6 (six) hours as needed for nausea.   pantoprazole 40 MG tablet Commonly known as:   PROTONIX Take 1 tablet (40 mg total) by mouth daily.   polyethylene glycol packet Commonly known as:  MIRALAX / GLYCOLAX Take 17 g by mouth daily.   simvastatin 20 MG tablet Commonly known as:  ZOCOR Take 1 tablet every other night. What changed:  how much to take  how to take this  when to take this  additional instructions      Follow-up Information    Phoebe Perch, MD Follow up in 6 day(s).   Specialty:  Surgery Contact information: [address] [place] [address] [phone]           Florene Glen, MD, FACS

## 2016-02-29 NOTE — Discharge Instructions (Signed)
Resume all home medications Advance diet as tolerated Follow-up in Dr. Antionette Char office in 6 days

## 2016-03-02 DIAGNOSIS — C50919 Malignant neoplasm of unspecified site of unspecified female breast: Secondary | ICD-10-CM

## 2016-03-02 HISTORY — DX: Malignant neoplasm of unspecified site of unspecified female breast: C50.919

## 2016-03-03 ENCOUNTER — Telehealth: Payer: Self-pay | Admitting: Surgery

## 2016-03-03 NOTE — Telephone Encounter (Signed)
Call made to patient's son, Larene Beach at this time. No answer. Left voicemail for return phone call.   Call made to patient's home phone. Explained that we would need to see her this week in office with Dr. Burt Knack per Discharge instructions. Appointment was made for 03/05/16 and we will address date of EUS at this appointment. Pt read back all appointment information and confirmed appointment.

## 2016-03-03 NOTE — Telephone Encounter (Signed)
Patient has a procedure on 1/11 with Dr. Mont Dutton per Dr. Burt Knack. Patients son,Shannon Ysidro Evert wants to know if this appointment should be pushed out further since she was just discharged 12/30. Please call to let them know.

## 2016-03-04 ENCOUNTER — Other Ambulatory Visit: Payer: Self-pay

## 2016-03-05 ENCOUNTER — Inpatient Hospital Stay: Payer: Self-pay | Admitting: Surgery

## 2016-03-06 ENCOUNTER — Inpatient Hospital Stay: Payer: Self-pay | Admitting: Surgery

## 2016-03-06 ENCOUNTER — Other Ambulatory Visit: Payer: Self-pay

## 2016-03-06 ENCOUNTER — Telehealth: Payer: Self-pay

## 2016-03-06 NOTE — Telephone Encounter (Signed)
error 

## 2016-03-06 NOTE — Telephone Encounter (Signed)
  Oncology Nurse Navigator Documentation Was able to get in contact with Ms. Havner. Went over education and instructions for EUS scheduled for 1/11. She denies anticoagulant use or diabetes. States she never received instructions in the mail. New copy sent out today. Provided her with my name and number to call with any further questions. INSTRUCTIONS FOR ENDOSCOPIC ULTRASOUND -Your procedure has been scheduled for January 11th with Dr. Mont Dutton at [facility]. -The hospital will contact you to pre-register over the phone.  -To get your scheduled arrival time, please call the Endoscopy unit at  [phone] between 1-3 p.m. on:  January 10th   -ON THE DAY OF YOU PROCEDURE:   1. If you are scheduled for a morning procedure, nothing to drink after midnight  -If you are scheduled for an afternoon procedure, you may have clear liquids until 5 hours prior  to the procedure but no carbonated drinks or broth  2. NO FOOD THE DAY OF YOUR PROCEDURE  3. You may take your heart, seizure, blood pressure, Parkinson's or breathing medications at  6am with just enough water to get your pills down  4. Do not take any oral Diabetic medications the morning of your procedure.  5. If you are a diabetic and are using insulin, please notify your prescribing physician of this  procedure as your dose may need to be altered related to not being able to eat or drink.   5. Do not take vitamins or fish oil for 5 days before your procedure     -On the day of your procedure, come to the [facility] Admitting/Registration desk (First desk on the right) at the scheduled arrival time. You MUST have someone drive you home from your procedure. You must have a responsible adult with a valid driver's license who is on site throughout your entire procedure and who can stay with you for several hours after your procedure. You may not go home alone in a taxi, shuttle [place] or bus, as the drivers will not be  responsible for you.  --If you have any questions please call me at the above contact        Navigator Location: [facility] (03/06/16 1100)   )Navigator Encounter Type: Education (03/06/16 1100)                                 Coordination of Care: EUS (03/06/16 1100)                  Time Spent with Patient: 30 (03/06/16 1100)

## 2016-03-10 ENCOUNTER — Telehealth: Payer: Self-pay | Admitting: Surgery

## 2016-03-10 NOTE — Telephone Encounter (Signed)
The patient will need to have her Upper EUS completed as scheduled and then follow-up with Dr. Burt Knack and we will discuss results at that  Appointment.   Please call patient and advise.

## 2016-03-10 NOTE — Telephone Encounter (Signed)
Patients son, Larene Beach, would like for you to call him regarding his mother @ [phone]. Does she need to keep the endo appointment at the hospital or does she need to see Dr. Burt Knack first. Her appt was canceled due to weather.  [phone] She was in the hospital over the holidays Hospital Follow-up: Duodenitis

## 2016-03-11 ENCOUNTER — Encounter: Payer: Self-pay | Admitting: *Deleted

## 2016-03-11 NOTE — Telephone Encounter (Signed)
Spoke with patients son at this time. He will call me back to make an appointment with Dr. Burt Knack tomorrow to set up an appointment

## 2016-03-12 ENCOUNTER — Encounter: Payer: Self-pay | Admitting: *Deleted

## 2016-03-12 ENCOUNTER — Ambulatory Visit: Payer: Medicare Other | Admitting: Anesthesiology

## 2016-03-12 ENCOUNTER — Ambulatory Visit
Admission: RE | Admit: 2016-03-12 | Discharge: 2016-03-12 | Disposition: A | Discharge status: Home / Self Care | Payer: Medicare Other | Source: Ambulatory Visit | Attending: Internal Medicine | Admitting: Internal Medicine

## 2016-03-12 ENCOUNTER — Encounter
Admission: RE | Disposition: A | Discharge status: Home / Self Care | Payer: Self-pay | Source: Ambulatory Visit | Attending: Internal Medicine

## 2016-03-12 DIAGNOSIS — K222 Esophageal obstruction: Secondary | ICD-10-CM | POA: Diagnosis not present

## 2016-03-12 DIAGNOSIS — Z87891 Personal history of nicotine dependence: Secondary | ICD-10-CM | POA: Insufficient documentation

## 2016-03-12 DIAGNOSIS — K269 Duodenal ulcer, unspecified as acute or chronic, without hemorrhage or perforation: Secondary | ICD-10-CM | POA: Diagnosis not present

## 2016-03-12 DIAGNOSIS — K8689 Other specified diseases of pancreas: Secondary | ICD-10-CM | POA: Insufficient documentation

## 2016-03-12 DIAGNOSIS — R935 Abnormal findings on diagnostic imaging of other abdominal regions, including retroperitoneum: Secondary | ICD-10-CM | POA: Insufficient documentation

## 2016-03-12 DIAGNOSIS — K2980 Duodenitis without bleeding: Secondary | ICD-10-CM | POA: Insufficient documentation

## 2016-03-12 DIAGNOSIS — K449 Diaphragmatic hernia without obstruction or gangrene: Secondary | ICD-10-CM | POA: Diagnosis not present

## 2016-03-12 DIAGNOSIS — R112 Nausea with vomiting, unspecified: Secondary | ICD-10-CM | POA: Diagnosis present

## 2016-03-12 DIAGNOSIS — I739 Peripheral vascular disease, unspecified: Secondary | ICD-10-CM | POA: Diagnosis not present

## 2016-03-12 DIAGNOSIS — I10 Essential (primary) hypertension: Secondary | ICD-10-CM | POA: Insufficient documentation

## 2016-03-12 DIAGNOSIS — J449 Chronic obstructive pulmonary disease, unspecified: Secondary | ICD-10-CM | POA: Diagnosis not present

## 2016-03-12 DIAGNOSIS — K219 Gastro-esophageal reflux disease without esophagitis: Secondary | ICD-10-CM | POA: Insufficient documentation

## 2016-03-12 DIAGNOSIS — K2970 Gastritis, unspecified, without bleeding: Secondary | ICD-10-CM | POA: Diagnosis not present

## 2016-03-12 HISTORY — PX: EUS: SHX5427

## 2016-03-12 HISTORY — PX: UPPER GI ENDOSCOPY: SHX6162

## 2016-03-12 SURGERY — ULTRASOUND, UPPER GI TRACT, ENDOSCOPIC
Anesthesia: General

## 2016-03-12 MED ORDER — FENTANYL CITRATE (PF) 100 MCG/2ML IJ SOLN
INTRAMUSCULAR | Status: AC
  Filled 2016-03-12: qty 2

## 2016-03-12 MED ORDER — PROPOFOL 500 MG/50ML IV EMUL
INTRAVENOUS | Status: DC | PRN
  Administered 2016-03-12: 75 ug/kg/min via INTRAVENOUS

## 2016-03-12 MED ORDER — SODIUM CHLORIDE 0.9 % IV SOLN
INTRAVENOUS | Status: DC
  Administered 2016-03-12: 11:00:00 via INTRAVENOUS

## 2016-03-12 MED ORDER — GLYCOPYRROLATE 0.2 MG/ML IJ SOLN
INTRAMUSCULAR | Status: AC
  Filled 2016-03-12: qty 1

## 2016-03-12 MED ORDER — LIDOCAINE 2% (20 MG/ML) 5 ML SYRINGE
INTRAMUSCULAR | Status: AC
  Filled 2016-03-12: qty 5

## 2016-03-12 MED ORDER — FENTANYL CITRATE (PF) 100 MCG/2ML IJ SOLN
INTRAMUSCULAR | Status: DC | PRN
  Administered 2016-03-12 (×2): 25 ug via INTRAVENOUS

## 2016-03-12 MED ORDER — PROPOFOL 10 MG/ML IV BOLUS
INTRAVENOUS | Status: DC | PRN
  Administered 2016-03-12: 40 mg via INTRAVENOUS
  Administered 2016-03-12 (×2): 20 mg via INTRAVENOUS

## 2016-03-12 MED ORDER — PROPOFOL 10 MG/ML IV BOLUS
INTRAVENOUS | Status: AC
  Filled 2016-03-12: qty 20

## 2016-03-12 MED ORDER — PROPOFOL 500 MG/50ML IV EMUL
INTRAVENOUS | Status: AC
  Filled 2016-03-12: qty 50

## 2016-03-12 MED ORDER — GLYCOPYRROLATE 0.2 MG/ML IJ SOLN
INTRAMUSCULAR | Status: DC | PRN
  Administered 2016-03-12: 0.2 mg via INTRAVENOUS

## 2016-03-12 MED ORDER — LIDOCAINE HCL (PF) 2 % IJ SOLN
INTRAMUSCULAR | Status: DC | PRN
  Administered 2016-03-12: 60 mg via INTRADERMAL

## 2016-03-12 NOTE — Anesthesia Preprocedure Evaluation (Signed)
Anesthesia Evaluation  Patient identified by MRN, date of birth, ID band Patient awake    Reviewed: Allergy & Precautions, NPO status , Patient's Chart, lab work & pertinent test results  History of Anesthesia Complications Negative for: history of anesthetic complications  Airway Mallampati: II       Dental   Pulmonary asthma , sleep apnea , COPD,  COPD inhaler, former smoker,           Cardiovascular hypertension, Pt. on medications + Peripheral Vascular Disease       Neuro/Psych Depression    GI/Hepatic PUD, GERD  Medicated and Controlled,  Endo/Other    Renal/GU      Musculoskeletal   Abdominal   Peds  Hematology   Anesthesia Other Findings   Reproductive/Obstetrics                             Anesthesia Physical Anesthesia Plan  ASA: III  Anesthesia Plan: General   Post-op Pain Management:    Induction: Intravenous  Airway Management Planned: Nasal Cannula  Additional Equipment:   Intra-op Plan:   Post-operative Plan:   Informed Consent: I have reviewed the patients History and Physical, chart, labs and discussed the procedure including the risks, benefits and alternatives for the proposed anesthesia with the patient or authorized representative who has indicated his/her understanding and acceptance.     Plan Discussed with:   Anesthesia Plan Comments:         Anesthesia Quick Evaluation

## 2016-03-12 NOTE — Transfer of Care (Signed)
Immediate Anesthesia Transfer of Care Note  Patient: Elizabeth Bailey  Procedure(s) Performed: Procedure(s): FULL UPPER ENDOSCOPIC ULTRASOUND (EUS) RADIAL (N/A)  Patient Location: PACU  Anesthesia Type:General  Level of Consciousness: sedated  Airway & Oxygen Therapy: Patient Spontanous Breathing and Patient connected to nasal cannula oxygen  Post-op Assessment: Report given to RN and Post -op Vital signs reviewed and stable  Post vital signs: Reviewed and stable  Last Vitals:  Vitals:   03/12/16 1147 03/12/16 1149  BP: (!) 85/62 92/62  Pulse: 91 89  Resp: 10 11  Temp: 36.6 C     Last Pain:  Vitals:   03/12/16 1147  TempSrc: Tympanic         Complications: No apparent anesthesia complications

## 2016-03-12 NOTE — Anesthesia Postprocedure Evaluation (Signed)
Anesthesia Post Note  Patient: Elizabeth Bailey  Procedure(s) Performed: Procedure(s) (LRB): FULL UPPER ENDOSCOPIC ULTRASOUND (EUS) RADIAL (N/A)  Patient location during evaluation: Endoscopy Anesthesia Type: General Level of consciousness: awake and alert Pain management: pain level controlled Vital Signs Assessment: post-procedure vital signs reviewed and stable Respiratory status: spontaneous breathing and respiratory function stable Cardiovascular status: stable Anesthetic complications: no     Last Vitals:  Vitals:   03/12/16 1147 03/12/16 1149  BP: (!) 85/62 92/62  Pulse: 91 89  Resp: 10 11  Temp: 36.6 C     Last Pain:  Vitals:   03/12/16 1147  TempSrc: Tympanic                 Georgios Kina K

## 2016-03-12 NOTE — Op Note (Signed)
[name] Patient Name: Margarette Vannatter Procedure Date: 03/12/2016 11:12 AM MRN: [id number] Account #: [id number] Date of Birth: [birth year] Admit Type: Outpatient Age: 76 Room: [facility] ENDO ROOM 3 Gender: Female Note Status: Finalized Procedure:            Upper EUS Indications:          Abnormal abdominal/pelvic CT scan: inflammation around                        the duodenum/pancreas (normal lipase) Patient Profile:      Refer to note in patient chart for documentation of                        history and physical. Providers:            Murray Hodgkins. Alorah Mcree Referring MD:         Arlis Porta, MD (Referring MD), Jerrol Banana.                        Burt Knack (Referring MD) Medicines:            Propofol per Anesthesia Complications:        No immediate complications. Procedure:            Pre-Anesthesia Assessment:                       Prior to the procedure, a History and Physical was                        performed, and patient medications and allergies were                        reviewed. The patient is competent. The risks and                        benefits of the procedure and the sedation options and                        risks were discussed with the patient. All questions                        were answered and informed consent was obtained.                        Patient identification and proposed procedure were                        verified by the physician, the nurse and the                        anesthetist in the pre-procedure area. Mental Status                        Examination: alert and oriented. Airway Examination:                        normal oropharyngeal airway and neck mobility.                        Respiratory Examination: clear to auscultation. CV  Examination: normal. Prophylactic Antibiotics: The                        patient does not require prophylactic antibiotics.       Prior Anticoagulants: The patient has taken no previous                        anticoagulant or antiplatelet agents. ASA Grade                        Assessment: III - A patient with severe systemic                        disease. After reviewing the risks and benefits, the                        patient was deemed in satisfactory condition to undergo                        the procedure. The anesthesia plan was to use monitored                        anesthesia care (MAC). Immediately prior to                        administration of medications, the patient was                        re-assessed for adequacy to receive sedatives. The                        heart rate, respiratory rate, oxygen saturations, blood                        pressure, adequacy of pulmonary ventilation, and                        response to care were monitored throughout the                        procedure. The physical status of the patient was                        re-assessed after the procedure.                       After obtaining informed consent, the endoscope was                        passed under direct vision. Throughout the procedure,                        the patient's blood pressure, pulse, and oxygen                        saturations were monitored continuously. The EUS GI                        Linear Array [id number] was introduced through the mouth,  and advanced to the duodenum for ultrasound examination                        from the esophagus, stomach and duodenum. The Endoscope                        was introduced through the mouth, and advanced to the                        second part of duodenum. The upper EUS was accomplished                        without difficulty. The patient tolerated the procedure                        well. Findings:      Endoscopic Finding :      A mild Schatzki ring (acquired) was found at the gastroesophageal       junction.       The exam of the esophagus was otherwise normal.      A medium-sized hiatal hernia was present.      The entire examined stomach was endoscopically normal. Biopsies were       taken with a cold forceps for Helicobacter pylori testing.      One cratered duodenal ulcer was found in the distal duodenal       bulb/duodenal sweep. The lesion was 15 mm in largest dimension. Biopsies       were taken from the edges of the ulcer with a cold forceps for histology.      Endosonographic Finding :      Pancreatic parenchymal abnormalities were noted in the pancreatic head.       These consisted of hyperechoic strands and heterogeneous appearance. No       mass lesion seen.      There was otherwise no sign of significant endosonographic parenchymal       or ductal abnormality in the entire pancreas. The PD measured 2.5 mm in       the head, 1.1 mm in the neck, 0.9 mm in the body, 1.2 mm in the tail.      There was no sign of significant endosonographic abnormality in the       common bile duct (5.3 mm) and in the common hepatic duct.      Endosonographic imaging in the left lobe of the liver showed no       abnormalities.      The celiac region was visualized and showed no sign of significant       endosonographic abnormality. Impression:           EGD Impressions:                       - Mild Schatzki ring.                       - Medium-sized hiatal hernia.                       - Normal stomach. Biopsied to exclude H. pylori.                       - One duodenal ulcer. Biopsied.  EUS Impressions:                       - Pancreatic parenchymal abnormalities consisting of                        hyperechoic strands and a heterogeneous appearance were                        noted in the pancreatic head. No obvious mass lesion                        seen. Suspect mild inflammatory changes.                       - There was otherwise no sign of significant pathology                         in the entire pancreas.                       - There was no sign of significant pathology in the                        common bile duct and in the common hepatic duct.                       - Normal visualized portions of the liver.                       - Normal celiac region. Recommendation:       - Discharge patient to home (ambulatory).                       - Await path results.                       - Consider a repeat cross-sectional imaging scan in 3-6                        months to assure resolution of findings on previous                        scan.                       - Consider repeat EGD in 3 months to assess for healing                        of duodenal ulcer.                       - Twice daily PPI X 8 weeks recommended.                       - The findings and recommendations were discussed with                        the patient and her family.                       - Return to referring physician as previously scheduled.  Procedure Code(s):    --- Professional ---                       [phone], Esophagogastroduodenoscopy, flexible, transoral;                        with endoscopic ultrasound examination limited to the                        esophagus, stomach or duodenum, and adjacent structures                       43239, 59,51, Esophagogastroduodenoscopy, flexible,                        transoral; with biopsy, single or multiple Diagnosis Code(s):    --- Professional ---                       R93.5, Abnormal findings on diagnostic imaging of other                        abdominal regions, including retroperitoneum                       K86.9, Disease of pancreas, unspecified                       K26.9, Duodenal ulcer, unspecified as acute or chronic,                        without hemorrhage or perforation                       K44.9, Diaphragmatic hernia without obstruction or                        gangrene                       K22.2, Esophageal  obstruction CPT copyright 2016 American Medical Association. All rights reserved. The codes documented in this report are preliminary and upon coder review may  be revised to meet current compliance requirements. Attending Participation:      I personally performed the entire procedure without the assistance of a       fellow, resident or surgical assistant. [place],  03/12/2016 12:07:46 PM This report has been signed electronically. Number of Addenda: 0 Note Initiated On: 03/12/2016 11:12 AM      [facility]

## 2016-03-12 NOTE — Anesthesia Procedure Notes (Signed)
Performed by: Kieryn Burtis Pre-anesthesia Checklist: Patient identified, Emergency Drugs available, Suction available, Patient being monitored and Timeout performed Patient Re-evaluated:Patient Re-evaluated prior to inductionOxygen Delivery Method: Nasal cannula Preoxygenation: Pre-oxygenation with 100% oxygen Intubation Type: IV induction       

## 2016-03-12 NOTE — Discharge Instructions (Signed)
Discharge to home °

## 2016-03-12 NOTE — H&P (View-Only) (Signed)
Elizabeth Bailey is an 76 y.o. female.    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 She had been in rehabilitation until Wednesday and went home and then comes back today with nausea vomiting and some mild abdominal pain. The back pain that she was having prior to admission in November is gone. The true etiology of her pancreatitis versus pancreatic head mass is not clear as biopsies done intraoperatively were showing inflammatory process only and no malignancy.  Patient has multiple significant past medical history is as listed below. Patient states that she is acting confused or feels as if she is confused but answers questions appropriately both to me and to her son.  A CT scan has been personally reviewed showing fluid collections. These are around the duodenum and pancreas however no oral contrast was given as the patient cannot tolerate it and a repeat contrasted CT scan may be indicated.  Past Medical History:  Diagnosis Date  . Arthritis   . Asthma   . Chronic bronchitis ([place])   . Collagen vascular disease ([place])   . Depression   . Diverticulosis   . GERD (gastroesophageal reflux disease)   . High cholesterol   . Hypertension   . Seasonal allergies     Past Surgical History:  Procedure Laterality Date  . ABDOMINAL HYSTERECTOMY    . BREAST BIOPSY  1970's  . COLONOSCOPY  2005  . DG  BONE DENSITY ([place] HX)  2010  . DIAGNOSTIC MAMMOGRAM  2015  . GALLBLADDER SURGERY    . PANCREAS SURGERY    . pap smear  2014  . REPAIR OF PERFORATED ULCER N/A 01/24/2016   Procedure: Exploratory Laparotomy with biopsy of Pancreas;  Surgeon: Florene Glen, MD;  Location: [facility] ORS;  Service: General;  Laterality: N/A;    Family History  Problem Relation Age of Onset  . Pneumonia Mother   . Depression Mother   . Depression Sister   . Breast cancer Sister 67  .  Diabetes Sister   . Stroke Brother   . Heart disease Brother    Social History:  reports that she has quit smoking. Her smoking use included Cigarettes. She has a 5.00 pack-year smoking history. She has never used smokeless tobacco. She reports that she drinks alcohol. She reports that she does not use drugs.  Allergies:  Allergies  Allergen Reactions  . Cyclobenzaprine     Other reaction(s): Other (See Comments) Frequent urination  . Flucytosine Other (See Comments)    Sore mouth  . Fluticasone-Salmeterol Other (See Comments)    Other reaction(s): Other (See Comments) Sore mouth  . Naproxen Nausea Only    GI upset  . Other Other (See Comments)    Other reaction(s): Other (See Comments) Frequent urination Other reaction(s): Other (See Comments) Frequent urination Frequent urination     (Not in a hospital admission)   Review of Systems  Constitutional: Positive for malaise/fatigue. Negative for chills and fever.  HENT: Negative.   Eyes: Negative.   Respiratory: Negative.   Cardiovascular: Negative.   Gastrointestinal: Positive for abdominal pain, nausea and vomiting. Negative for blood in stool, constipation, diarrhea, heartburn and melena.  Genitourinary: Negative.   Musculoskeletal: Negative.   Skin: Negative.   Neurological: Negative.   Endo/Heme/Allergies: Negative.   Psychiatric/Behavioral: Negative.      Physical Exam:  BP (!) 166/79   Pulse 71  Temp 97.9 F (36.6 C) (Oral)   Resp 19   Ht 5' 2"  (1.575 m)   Wt 130 lb (59 kg)   SpO2 100%   BMI 23.78 kg/m   Physical Exam  Constitutional: She is oriented to person, place, and time and well-developed, well-nourished, and in no distress. No distress.  HENT:  Head: Normocephalic and atraumatic.  Eyes: Pupils are equal, round, and reactive to light. Right eye exhibits no discharge. Left eye exhibits no discharge. No scleral icterus.  Neck: Normal range of motion.  Cardiovascular: Normal rate, regular  rhythm and normal heart sounds.   Pulmonary/Chest: Effort normal and breath sounds normal. No respiratory distress. She has no wheezes. She has no rales.  Abdominal: Soft. She exhibits no distension. There is no tenderness. There is no rebound and no guarding.  Midline scar healing well no erythema no drainage abdominal exam is benign with no tenderness and no tympany.  Musculoskeletal: Normal range of motion. She exhibits edema. She exhibits no tenderness or deformity.  Lymphadenopathy:    She has no cervical adenopathy.  Neurological: She is alert and oriented to person, place, and time.  Skin: Skin is warm and dry. No rash noted. She is not diaphoretic. No erythema.  Psychiatric: Mood and affect normal.  Vitals reviewed.       Results for orders placed or performed during the hospital encounter of 02/24/16 (from the past 48 hour(s))  Comprehensive metabolic panel     Status: Abnormal   Collection Time: 02/24/16 10:25 AM  Result Value Ref Range   Sodium 130 (L) 135 - 145 mmol/L   Potassium 3.1 (L) 3.5 - 5.1 mmol/L   Chloride 89 (L) 101 - 111 mmol/L   CO2 25 22 - 32 mmol/L   Glucose, Bld 139 (H) 65 - 99 mg/dL   BUN 16 6 - 20 mg/dL   Creatinine, Ser 1.30 (H) 0.44 - 1.00 mg/dL   Calcium 9.7 8.9 - 10.3 mg/dL   Total Protein 8.6 (H) 6.5 - 8.1 g/dL   Albumin 3.4 (L) 3.5 - 5.0 g/dL   AST 36 15 - 41 U/L   ALT 15 14 - 54 U/L   Alkaline Phosphatase 78 38 - 126 U/L   Total Bilirubin 1.5 (H) 0.3 - 1.2 mg/dL   GFR calc non Af Amer 39 (L) >60 mL/min   GFR calc Af Amer 46 (L) >60 mL/min    Comment: (NOTE) The eGFR has been calculated using the CKD EPI equation. This calculation has not been validated in all clinical situations. eGFR's persistently <60 mL/min signify possible Chronic Kidney Disease.    Anion gap 16 (H) 5 - 15  CBC     Status: Abnormal   Collection Time: 02/24/16 10:25 AM  Result Value Ref Range   WBC 12.5 (H) 3.6 - 11.0 K/uL   RBC 3.59 (L) 3.80 - 5.20 MIL/uL    Hemoglobin 11.5 (L) 12.0 - 16.0 g/dL   HCT 32.9 (L) 35.0 - 47.0 %   MCV 91.7 80.0 - 100.0 fL   MCH 32.1 26.0 - 34.0 pg   MCHC 35.0 32.0 - 36.0 g/dL   RDW 13.1 11.5 - 14.5 %   Platelets 504 (H) 150 - 440 K/uL  Lipase, blood     Status: None   Collection Time: 02/24/16 10:25 AM  Result Value Ref Range   Lipase 49 11 - 51 U/L  Troponin I     Status: None   Collection Time: 02/24/16 10:25 AM  Result Value Ref Range   Troponin I <0.03 <0.03 ng/mL  Urinalysis, Complete w Microscopic     Status: Abnormal   Collection Time: 02/24/16 11:00 AM  Result Value Ref Range   Color, Urine AMBER (A) YELLOW    Comment: BIOCHEMICALS MAY BE AFFECTED BY COLOR   APPearance CLEAR (A) CLEAR   Specific Gravity, Urine 1.020 1.005 - 1.030   pH 5.0 5.0 - 8.0   Glucose, UA NEGATIVE NEGATIVE mg/dL   Hgb urine dipstick NEGATIVE NEGATIVE   Bilirubin Urine NEGATIVE NEGATIVE   Ketones, ur 20 (A) NEGATIVE mg/dL   Protein, ur 100 (A) NEGATIVE mg/dL   Nitrite NEGATIVE NEGATIVE   Leukocytes, UA NEGATIVE NEGATIVE   RBC / HPF 0-5 0 - 5 RBC/hpf   WBC, UA 0-5 0 - 5 WBC/hpf   Bacteria, UA NONE SEEN NONE SEEN   Squamous Epithelial / LPF 0-5 (A) NONE SEEN   Mucous PRESENT    Hyaline Casts, UA PRESENT    Dg Chest 1 View  Result Date: 02/24/2016 CLINICAL DATA:  Hemoptysis with nausea and vomiting since yesterday. EXAM: CHEST 1 VIEW COMPARISON:  01/24/2016 FINDINGS: Lungs are adequately inflated without consolidation or effusion. Cardiomediastinal silhouette is within normal. There is calcified plaque over the thoracic aorta. There are mild degenerative changes of the spine. IMPRESSION: No acute cardiopulmonary disease. Electronically Signed   By: Marin Olp M.D.   On: 02/24/2016 10:32   Ct Abdomen Pelvis W Contrast  Result Date: 02/24/2016 CLINICAL DATA:  Flank pain, nausea, vomiting and arm opt assist since yesterday. EXAM: CT ABDOMEN AND PELVIS WITH CONTRAST TECHNIQUE: Multidetector CT imaging of the abdomen and  pelvis was performed using the standard protocol following bolus administration of intravenous contrast. CONTRAST:  75 ml ISOVUE-300 IOPAMIDOL (ISOVUE-300) INJECTION 61% COMPARISON:  MRI abdomen 01/27/2016. CT chest, abdomen and pelvis 01/24/2016. FINDINGS: Lower chest: Emphysematous disease is seen in the lung bases. Right lower lobe pulmonary nodule on image 7 measuring 0.9 cm is unchanged since the prior CT. Lung bases are otherwise clear. Small hiatal hernia noted. No pleural or pericardial effusion. Hepatobiliary: Status post cholecystectomy. The liver appears normal. Biliary tree is unremarkable Pancreas: A new fluid collection is seen in the uncinate process the pancreas measuring 0.9 x 1.1 cm on image 31. Additionally, 2 other fluid collections are new since the prior exams. Collection on the right abuts the the undersurface of the right hepatic lobe and first portion of the duodenum measuring 3.5 x 2.7 x 4.2 cm craniocaudal. More central collection measures 4.5 x 2.2 x 3.9 cm craniocaudal and abuts the posterior aspect of body of the stomach. The remainder of the pancreas is unremarkable. There is no pancreatic ductal dilatation. Spleen: Negative. Adrenals/Urinary Tract: Negative. Stomach/Bowel: Extensive stranding is present about the first and second portions of the duodenum and there is wall thickening about these structures. Small bowel is otherwise unremarkable. Extensive sigmoid diverticulosis is noted. Wall thickening about the sigmoid is unchanged. The colon is otherwise unremarkable. The appendix appears normal. Vascular/Lymphatic: Extensive aortoiliac atherosclerosis without aneurysm. Reproductive: Status post hysterectomy. Other: No fracture or worrisome lesion. Musculoskeletal: None. IMPRESSION: Findings most consistent with duodenitis about the first and second portions of the duodenum. Three new fluid collections are identified as described above which could be abscesses or pseudocysts. Note  is made that the patient's lipase is normal. Endoscopy could be used for further evaluation. Sigmoid diverticulosis with chronic wall thickening compatible with diverticulitis. Extensive atherosclerosis. No change in a 0.9 cm  right lower lobe pulmonary nodule. Emphysema. Electronically Signed   By: Inge Rise M.D.   On: 02/24/2016 12:18     Assessment/Plan  Noncontrasted CT scan was performed as patient cannot tolerate oral contrast. There are fluid collections in the area of the duodenum and uncinate process but it is not quite clear if some of these are actually intraluminal as no contrast is present.  The etiology of this patient's process is unclear. Her biopsies been negative with the exception of findings of inflammatory change in the pancreatic head but there was a mass present and palpable at the time of her last surgery a month ago.  Because of the patient's nausea vomiting of recommended admission to the hospital. She has an EGD scheduled for 2 weeks from now but I would like to obtain GI consultation and asked them to become more involved and probably or possibly move up the EGD timing. I do not now believe that this is a perforated viscus as was her preoperative diagnosis back in November. This plan was discussed with she and her son they understood and agreed to proceed also discussed this with the emergency room physician  Florene Glen, MD, FACS

## 2016-03-12 NOTE — Interval H&P Note (Signed)
History and Physical Interval Note:  03/12/2016 11:18 AM  Elizabeth Bailey  has presented today for surgery, with the diagnosis of pathology with reactive and atrophic pancreatic parenchymal change  The various methods of treatment have been discussed with the patient and family. After consideration of risks, benefits and other options for treatment, the patient has consented to  Procedure(s): FULL UPPER ENDOSCOPIC ULTRASOUND (EUS) RADIAL (N/A) as a surgical intervention .  The patient's history has been reviewed, patient examined, no change in status, stable for surgery.  I have reviewed the patient's chart and labs.  Questions were answered to the patient's satisfaction.     Tillie Rung

## 2016-03-13 ENCOUNTER — Encounter: Payer: Self-pay | Admitting: Internal Medicine

## 2016-03-16 LAB — SURGICAL PATHOLOGY

## 2016-03-17 ENCOUNTER — Encounter: Payer: Self-pay | Admitting: Surgery

## 2016-03-17 ENCOUNTER — Ambulatory Visit (INDEPENDENT_AMBULATORY_CARE_PROVIDER_SITE_OTHER): Payer: Medicare Other | Admitting: Surgery

## 2016-03-17 ENCOUNTER — Other Ambulatory Visit: Payer: Self-pay | Admitting: Family Medicine

## 2016-03-17 VITALS — BP 99/68 | HR 109 | Temp 97.9°F | Ht 62.0 in | Wt 109.0 lb

## 2016-03-17 DIAGNOSIS — Z9889 Other specified postprocedural states: Secondary | ICD-10-CM

## 2016-03-17 DIAGNOSIS — K265 Chronic or unspecified duodenal ulcer with perforation: Secondary | ICD-10-CM

## 2016-03-17 DIAGNOSIS — K251 Acute gastric ulcer with perforation: Secondary | ICD-10-CM

## 2016-03-17 MED ORDER — ESOMEPRAZOLE MAGNESIUM 20 MG PO CPDR: 20.0000 mg | DELAYED_RELEASE_CAPSULE | Freq: Two times a day (BID) | ORAL | 0 refills | Status: DC

## 2016-03-17 MED ORDER — OMEPRAZOLE 20 MG PO CPDR: 20.0000 mg | DELAYED_RELEASE_CAPSULE | Freq: Two times a day (BID) | ORAL | 0 refills | Status: DC

## 2016-03-17 MED ORDER — ESOMEPRAZOLE MAGNESIUM 40 MG PO CPDR: 40.0000 mg | DELAYED_RELEASE_CAPSULE | Freq: Two times a day (BID) | ORAL | 6 refills | Status: DC

## 2016-03-17 NOTE — Patient Instructions (Addendum)
We would like for you to see Dr.[employer] account planner).We will call you later today with that appointment.Please pick up your medicine at the Pharmacy. Please call our office if you have questions or concerns.

## 2016-03-17 NOTE — Progress Notes (Signed)
Outpatient Surgical Follow Up  03/17/2016  Elizabeth Bailey is an 76 y.o. female.   CC: Pancreatitis  HPI: This patient with a complex history who presented with signs of a perforated ulcer who was taken the operating room. No ulcer was identified by pancreatitis and a pancreatic head mass was identified. Biopsy showed pancreatitis only without malignancy. Subsequent to this she has had multiple evaluations utilizing multimodal techniques including MRI and recently a intraluminal ultrasound. Consistently no mass has been identified other than inflammatory process.  Past Medical History:  Diagnosis Date  . Arthritis   . Asthma   . Chronic bronchitis ([place])   . Collagen vascular disease ([place])   . Depression   . Diverticulosis   . GERD (gastroesophageal reflux disease)   . High cholesterol   . Hypertension   . Seasonal allergies     Past Surgical History:  Procedure Laterality Date  . ABDOMINAL HYSTERECTOMY    . BREAST BIOPSY  1970's  . COLONOSCOPY  2005  . DG  BONE DENSITY ([place] HX)  2010  . DIAGNOSTIC MAMMOGRAM  2015  . EUS N/A 03/12/2016   Procedure: FULL UPPER ENDOSCOPIC ULTRASOUND (EUS) RADIAL;  Surgeon: Holly Bodily, MD;  Location: [facility] ENDOSCOPY;  Service: Endoscopy;  Laterality: N/A;  . GALLBLADDER SURGERY    . PANCREAS SURGERY    . pap smear  2014  . REPAIR OF PERFORATED ULCER N/A 01/24/2016   Procedure: Exploratory Laparotomy with biopsy of Pancreas;  Surgeon: Florene Glen, MD;  Location: [facility] ORS;  Service: General;  Laterality: N/A;    Family History  Problem Relation Age of Onset  . Pneumonia Mother   . Depression Mother   . Depression Sister   . Breast cancer Sister 65  . Diabetes Sister   . Stroke Brother   . Heart disease Brother     Social History:  reports that she has quit smoking. Her smoking use included Cigarettes. She has a 5.00 pack-year smoking history. She has never used smokeless tobacco. She reports that she does not drink alcohol or  use drugs.  Allergies:  Allergies  Allergen Reactions  . Cyclobenzaprine     Other reaction(s): Other (See Comments) Frequent urination  . Flucytosine Other (See Comments)    Sore mouth  . Fluticasone-Salmeterol Other (See Comments)    Other reaction(s): Other (See Comments) Sore mouth  . Naproxen Nausea Only    GI upset  . Other Other (See Comments)    Other reaction(s): Other (See Comments) Frequent urination Other reaction(s): Other (See Comments) Frequent urination Frequent urination    Medications reviewed.   Review of Systems:   Review of Systems  Constitutional: Positive for malaise/fatigue. Negative for chills and fever.  HENT: Negative.   Eyes: Negative.   Respiratory: Negative.   Cardiovascular: Negative.   Gastrointestinal: Positive for abdominal pain. Negative for blood in stool, constipation, diarrhea, heartburn, melena, nausea and vomiting.  Genitourinary: Negative.   Musculoskeletal: Negative.   Skin: Negative.   Neurological: Positive for weakness.  Endo/Heme/Allergies: Negative.   Psychiatric/Behavioral: Negative.      Physical Exam:  There were no vitals taken for this visit.  Physical Exam  Constitutional: She is oriented to person, place, and time. No distress.  Elderly frail female patient  HENT:  Head: Normocephalic and atraumatic.  Eyes: Pupils are equal, round, and reactive to light. Right eye exhibits no discharge. Left eye exhibits no discharge. No scleral icterus.  Neck: Normal range of motion.  Cardiovascular: Normal  rate, regular rhythm and normal heart sounds.   Pulmonary/Chest: Effort normal and breath sounds normal. No respiratory distress. She has no wheezes. She has no rales.  Abdominal: Soft. She exhibits no distension. There is no tenderness. There is no rebound and no guarding.  Musculoskeletal: Normal range of motion. She exhibits no edema.  Lymphadenopathy:    She has no cervical adenopathy.  Neurological: She is  alert and oriented to person, place, and time.  Skin: Skin is warm and dry. She is not diaphoretic.  Vitals reviewed.     No results found for this or any previous visit (from the past 48 hour(s)). No results found.  Assessment/Plan:  Results of endoluminal ultrasound was reviewed. No sign of pancreatic head mass. EGD demonstrated ulcers. She is on Nexium at this point. She has been on a proton pump inhibitor since her original surgery back the day after Thanksgiving. She is improving gradually and showing no signs of malignancy at this point. The recommendations were to continue the Nexium and 2 have a repeat EGD in 6 weeks. We will arrange for that either through the [facility] service or through Dr. Allen Norris. Patient will follow up with [place] on an as-needed basis  Florene Glen, MD, FACS

## 2016-03-18 ENCOUNTER — Ambulatory Visit: Payer: Medicare Other

## 2016-03-20 ENCOUNTER — Other Ambulatory Visit: Payer: Self-pay

## 2016-03-23 ENCOUNTER — Ambulatory Visit: Payer: Self-pay | Admitting: Surgery

## 2016-03-27 ENCOUNTER — Ambulatory Visit
Admission: RE | Admit: 2016-03-27 | Discharge: 2016-03-27 | Disposition: A | Discharge status: Home / Self Care | Payer: Medicare Other | Source: Ambulatory Visit | Attending: Family Medicine | Admitting: Family Medicine

## 2016-03-27 DIAGNOSIS — C50211 Malignant neoplasm of upper-inner quadrant of right female breast: Secondary | ICD-10-CM | POA: Insufficient documentation

## 2016-03-27 DIAGNOSIS — N6310 Unspecified lump in the right breast, unspecified quadrant: Secondary | ICD-10-CM

## 2016-03-27 HISTORY — PX: BREAST BIOPSY: SHX20

## 2016-03-30 ENCOUNTER — Telehealth: Payer: Self-pay | Admitting: Family Medicine

## 2016-03-30 ENCOUNTER — Encounter: Payer: Self-pay | Admitting: *Deleted

## 2016-03-30 ENCOUNTER — Other Ambulatory Visit: Payer: Self-pay | Admitting: *Deleted

## 2016-03-30 ENCOUNTER — Telehealth: Payer: Self-pay | Admitting: *Deleted

## 2016-03-30 NOTE — Telephone Encounter (Signed)
R/T call to [facility] Dr. Luan Pulling has reviewed biopsy results and appointment has been scheduled with Dr. Jamal Collin 04/01/16.

## 2016-03-30 NOTE — Telephone Encounter (Signed)
Joann at [facility] asked to speak to nurse about pt's breast biopsy.  Her call back number is [phone]

## 2016-03-30 NOTE — Telephone Encounter (Signed)
Patient has appointment scheduled to see Dr. Saunders Revel 04/01/2016 at 4 arrival 4 pm.

## 2016-03-31 ENCOUNTER — Telehealth: Payer: Self-pay | Admitting: *Deleted

## 2016-03-31 ENCOUNTER — Encounter: Payer: Self-pay | Admitting: *Deleted

## 2016-03-31 NOTE — Telephone Encounter (Signed)
Patient aware of appt 04/01/2016 @ 4:15 told to arrive at 4 pm.

## 2016-04-01 ENCOUNTER — Ambulatory Visit (INDEPENDENT_AMBULATORY_CARE_PROVIDER_SITE_OTHER): Payer: Medicare Other | Admitting: General Surgery

## 2016-04-01 ENCOUNTER — Encounter: Payer: Self-pay | Admitting: *Deleted

## 2016-04-01 VITALS — BP 110/70 | HR 78 | Resp 18 | Ht 62.0 in | Wt 126.0 lb

## 2016-04-01 DIAGNOSIS — Z170 Estrogen receptor positive status [ER+]: Secondary | ICD-10-CM | POA: Diagnosis not present

## 2016-04-01 DIAGNOSIS — C50211 Malignant neoplasm of upper-inner quadrant of right female breast: Secondary | ICD-10-CM

## 2016-04-01 NOTE — Progress Notes (Signed)
Patient ID: Elizabeth Bailey, female   DOB: Jan 22, 1941, 76 y.o.   MRN: [id number]  Chief Complaint  Patient presents with  . Breast Problem    diagnosis of breast cancer    HPI Elizabeth Bailey is a 76 y.o. female.  who presents for a breast evaluation. The most recent mammogram and biopsy was done on 03-27-16 showing invasive carcinoma in the right breast.  Patient does perform regular self breast checks and gets regular mammograms done.   She denies any breast symptoms. Her initial mammogram was 2 mos ago but she developed abdominal pain with subsequent laparotomy for suspected perforated duodenal ulcer. At surgery no perforation was noted. Subsequent endoscopy showed a duodenal ulcer. I have reviewed the history of present illness with the patient.    HPI  Past Medical History:  Diagnosis Date  . Arthritis   . Asthma   . Chronic bronchitis ([place])   . Collagen vascular disease ([place])   . Depression   . Diverticulosis   . GERD (gastroesophageal reflux disease)   . High cholesterol   . Hypertension   . Seasonal allergies     Past Surgical History:  Procedure Laterality Date  . ABDOMINAL HYSTERECTOMY    . BREAST BIOPSY  1970's  . BREAST BIOPSY Right 03/27/2016   INVASIVE MAMMARY CARCINOMA  . COLONOSCOPY  2005  . DG  BONE DENSITY ([place] HX)  2010  . DIAGNOSTIC MAMMOGRAM  2015  . EUS N/A 03/12/2016   Procedure: FULL UPPER ENDOSCOPIC ULTRASOUND (EUS) RADIAL;  Surgeon: Holly Bodily, MD;  Location: [facility] ENDOSCOPY;  Service: Endoscopy;  Laterality: N/A;  . GALLBLADDER SURGERY    . PANCREAS SURGERY  01/24/2016   Dr Burt Knack  . pap smear  2014  . REPAIR OF PERFORATED ULCER N/A 01/24/2016   Procedure: Exploratory Laparotomy with biopsy of Pancreas;  Surgeon: Florene Glen, MD;  Location: [facility] ORS;  Service: General;  Laterality: N/A;  . UPPER GI ENDOSCOPY  03/12/2016   Dr Mont Dutton    Family History  Problem Relation Age of Onset  . Pneumonia Mother   . Depression Mother   .  Depression Sister   . Breast cancer Sister 78  . Diabetes Sister   . Stroke Brother   . Heart disease Brother     Social History Social History  Substance Use Topics  . Smoking status: Former Smoker    Packs/day: 0.50    Years: 10.00    Types: Cigarettes  . Smokeless tobacco: Never Used     Comment: quit 1988  . Alcohol use No     Comment: wine twice a month     Allergies  Allergen Reactions  . Cyclobenzaprine     Other reaction(s): Other (See Comments) Frequent urination  . Flucytosine Other (See Comments)    Sore mouth  . Fluticasone-Salmeterol Other (See Comments)    Other reaction(s): Other (See Comments) Sore mouth  . Naproxen Nausea Only    GI upset  . Other Other (See Comments)    Other reaction(s): Other (See Comments) Frequent urination Other reaction(s): Other (See Comments) Frequent urination Frequent urination    Current Outpatient Prescriptions  Medication Sig Dispense Refill  . Adalimumab (HUMIRA PEN) 40 MG/0.8ML PNKT Inject 40 mg into the skin every 21 ( twenty-one) days. TAKES EVERY OTHER WEEK  LAST DOSE WAS IN 01-2016    . Amino Acids-Protein Hydrolys (FEEDING SUPPLEMENT, PRO-STAT SUGAR FREE 64,) LIQD Take 30 mLs by mouth 3 (three) times daily. (Patient  not taking: Reported on 04/03/2016) 900 mL 0  . amLODipine (NORVASC) 5 MG tablet TAKE 1 TABLET BY MOUTH ONCE DAILY. 30 tablet 12  . bisacodyl (DULCOLAX) 10 MG suppository Place 1 suppository (10 mg total) rectally daily as needed for moderate constipation. (Patient not taking: Reported on 04/03/2016) 12 suppository 0  . budesonide-formoterol (SYMBICORT) 160-4.5 MCG/ACT inhaler Inhale 1 puff into the lungs 2 (two) times daily. 1 Inhaler 12  . calcium-vitamin D (CALCIUM 500/D) 500-200 MG-UNIT tablet Take 1 tablet by mouth daily.    Marland Kitchen escitalopram (LEXAPRO) 20 MG tablet Take 1 tablet daily (Patient not taking: Reported on 04/03/2016) 30 tablet 12  . esomeprazole (NEXIUM) 20 MG capsule Take 1 capsule (20 mg  total) by mouth 2 (two) times daily before a meal. (Patient not taking: Reported on 04/03/2016) 60 capsule 0  . esomeprazole (NEXIUM) 40 MG capsule Take 1 capsule (40 mg total) by mouth 2 (two) times daily before a meal. 60 capsule 6  . feeding supplement (BOOST / RESOURCE BREEZE) LIQD Take 1 Container by mouth 3 (three) times daily between meals.  0  . fluticasone (FLONASE) 50 MCG/ACT nasal spray Place 2 sprays into the nose daily as needed for allergies or rhinitis.     Marland Kitchen HYDROcodone-acetaminophen (NORCO/VICODIN) 5-325 MG tablet Take 1 tablet by mouth at bedtime as needed for moderate pain.     Marland Kitchen losartan-hydrochlorothiazide (HYZAAR) 100-25 MG tablet TAKE 1 TABLET BY MOUTH ONCE DAILY. 30 tablet 12  . Melatonin 10 MG TABS Take 2 tablets by mouth at bedtime. Reported on 06/03/2015    . montelukast (SINGULAIR) 10 MG tablet TAKE 1 TABLET BY MOUTH ONCE DAILY. 30 tablet 12  . Multiple Vitamin (MULTI-VITAMINS) TABS Take 1 tablet by mouth daily.    . Omega 3 1000 MG CAPS Take 1,000 mg by mouth daily. omega-3 fatty acids-vitamin E (FISH OIL) 1,000 mg    . ondansetron (ZOFRAN) 4 MG tablet Take 1 tablet (4 mg total) by mouth every 6 (six) hours as needed for nausea. 20 tablet 0  . polyethylene glycol (MIRALAX / GLYCOLAX) packet Take 17 g by mouth daily. (Patient not taking: Reported on 04/03/2016) 14 each 0  . simvastatin (ZOCOR) 20 MG tablet Take 1 tablet every other night. (Patient taking differently: Take 20 mg by mouth every other day. Take 1 tablet every night.) 30 tablet 12  . acetaminophen (TYLENOL) 325 MG tablet Take 650 mg by mouth every 6 (six) hours as needed for mild pain.    Marland Kitchen albuterol (PROVENTIL HFA;VENTOLIN HFA) 108 (90 Base) MCG/ACT inhaler Inhale 2 puffs into the lungs every 6 (six) hours as needed for wheezing or shortness of breath.    . Biotin w/ Vitamins C & E (HAIR/SKIN/NAILS PO) Take 1 tablet by mouth 2 (two) times daily.    . diphenhydramine-acetaminophen (TYLENOL PM) 25-500 MG TABS tablet  Take 1-2 tablets by mouth at bedtime as needed (SLEEP). 1-2 TABLETS DEPENDING ON DIFFICULTLY SLEEPING    . pantoprazole (PROTONIX) 40 MG tablet Take 1 tablet (40 mg total) by mouth daily. 30 tablet 3   No current facility-administered medications for this visit.     Review of Systems Review of Systems  Constitutional: Negative.   Respiratory: Negative.   Cardiovascular: Negative.     Blood pressure 110/70, pulse 78, resp. rate 18, height _0  (1.575 m), weight 126 lb (57.2 kg).  Physical Exam Physical Exam  Constitutional: She is oriented to person, place, and time. She appears well-developed and well-nourished.  Eyes: Conjunctivae are normal. No scleral icterus.  Neck: Neck supple.  Cardiovascular: Normal rate, regular rhythm and normal heart sounds.   Pulmonary/Chest: Effort normal and breath sounds normal. Right breast exhibits mass. Right breast exhibits no inverted nipple, no nipple discharge, no skin change and no tenderness. Left breast exhibits no inverted nipple, no mass, no nipple discharge, no skin change and no tenderness.    Lymphadenopathy:    She has no cervical adenopathy.  Neurological: She is alert and oriented to person, place, and time.  Skin: Skin is warm and dry.  Psychiatric: Her behavior is normal.    Data Reviewed  Mammogram, [place]  and pathology reviewed 1.6cm mass UIQ right breast. [place] of axilla was negative Clinical T1c, N0 Assessment    Invasive mammary carcinoma, right breast ER/PR positive, HER2 2+, FISH pending    Plan   Discussed treatment options with pt- surgery, radiation and antihormonal therapy. Possible need for chemo based on final Her 2 value and lymph node involvement. She is agreeable to wide excision and SN biopsy     Educated patient on procedure, benefits and risks. Patient vocalized understanding.  CBC, CMP, and CA 27-29 ordered.    Patient's surgery has been scheduled for 04-09-16 at [facility].   This information has been  scribed by Karie Fetch RN, BSN,BC.   Tenesha Garza G 04/03/2016, 8:51 AM

## 2016-04-01 NOTE — Patient Instructions (Addendum)
The patient is aware to call back for any questions or concerns.   Lumpectomy A lumpectomy, sometimes called a partial mastectomy, is surgery to remove a cancerous tumor or mass (the lump) from a breast. It is a form of "breast conserving" or "breast preservation" surgery. This means that the cancerous tissue is removed but the breast remains intact. During a lumpectomy, the portion of the breast that contains the tumor is removed. Some normal tissue around the lump may be taken out to make sure that all of the tumor has been removed. Lymph nodes under your arm may also be removed and tested to find out if the cancer has spread. Tell a health care provider about:  Any allergies you have.  All medicines you are taking, including vitamins, herbs, eye drops, creams, and over-the-counter medicines.  Any problems you or family members have had with anesthetic medicines.  Any blood disorders you have.  Any surgeries you have had.  Any medical conditions you have.  Whether you are pregnant or may be pregnant. What are the risks? Generally, this is a safe procedure. However, problems may occur, including:  Bleeding.  Infection.  Pain, swelling, weakness, or numbness in the arm on the side of your surgery.  Temporary swelling.  Change in the shape of the breast, particularly if a large portion is removed.  Scar tissue that forms at the surgical site and feels hard to the touch. What happens before the procedure? Staying hydrated  Follow instructions from your health care provider about hydration, which may include:  Up to 2 hours before the procedure - you may continue to drink clear liquids, such as water, clear fruit juice, black coffee, and plain tea. Eating and drinking restrictions  Follow instructions from your health care provider about eating and drinking, which may include:  8 hours before the procedure - stop eating heavy meals or foods such as meat, fried foods, or fatty  foods.  6 hours before the procedure - stop eating light meals or foods, such as toast or cereal.  6 hours before the procedure - stop drinking milk or drinks that contain milk.  2 hours before the procedure - stop drinking clear liquids. Medicines  Ask your health care provider about:  Changing or stopping your regular medicines. This is especially important if you are taking diabetes medicines or blood thinners.  Taking medicines such as aspirin and ibuprofen. These medicines can thin your blood. Do not take these medicines before your procedure if your health care provider instructs you not to.  You may be given antibiotic medicine to help prevent infection. General instructions  Ask your health care provider how your surgical site will be marked or identified.  You may be screened for extra fluid around the lymph nodes (lymphedema).  Plan to have someone take you home from the hospital or clinic.  On the day of surgery, your health care provider will use a mammogram or ultrasound to locate and mark the tumor in your breast. These markings on your breast will show where the incision will be made. What happens during the procedure?  To lower your risk of infection:  Your health care team will wash or sanitize their hands.  Your skin will be washed with soap.  An IV tube will be put into one of your veins.  You will be given one or more of the following:  A medicine to help you relax (sedative).  A medicine to numb the area (local anesthetic).  A medicine to make you fall asleep (general anesthetic).  Your health care provider will use a kind of electric scalpel that uses heat to minimize bleeding (electrocautery knife). A curved incision that follows the natural curve of your breast will be made. This type of incision will allow for minimal scarring and better healing.  The tumor will be removed along with some of the surrounding tissue. This will be sent to the lab for  testing. Your health care provider may also remove lymph nodes at this time if needed.  A small drain tube may be inserted into your breast area or armpit to collect fluid that may build up after surgery. This tube will be connected to a suction bulb on the outside of your body to remove the fluid.  The incision will be closed with stitches (sutures).  A bandage (dressing) may be placed over the incision. The procedure may vary among health care providers and hospitals. What happens after the procedure?  Your blood pressure, heart rate, breathing rate, and blood oxygen level will be monitored until the medicines you were given have worn off.  You will be given medicine for pain.  You may have a drain tube in place for 2-3 days to prevent a collection of blood (hematoma) from developing in the breast. You will be given instructions about caring for the drain before you go home.  A pressure bandage may be applied for 1-2 days to prevent bleeding or swelling. Your pressure bandage may look like a thick piece of fabric or an elastic wrap. Ask your health care provider how to care for your bandage at home.  You may be given a tight sleeve to wear over your arm on the side of your surgery. You should wear this sleeve as told by your health care provider.  Do not drive for 24 hours if you were given a sedative. This information is not intended to replace advice given to you by your health care provider. Make sure you discuss any questions you have with your health care provider. Document Released: 03/30/2006 Document Revised: 10/31/2015 Document Reviewed: 10/30/2015 Elsevier Interactive Patient Education  2017 [employer].

## 2016-04-02 ENCOUNTER — Telehealth: Payer: Self-pay | Admitting: *Deleted

## 2016-04-02 ENCOUNTER — Encounter: Payer: Self-pay | Admitting: *Deleted

## 2016-04-02 LAB — COMPREHENSIVE METABOLIC PANEL
ALK PHOS: 74 IU/L (ref 39–117)
ALT: 10 IU/L (ref 0–32)
AST: 19 IU/L (ref 0–40)
Albumin/Globulin Ratio: 1.2 (ref 1.2–2.2)
Albumin: 4.2 g/dL (ref 3.5–4.8)
BUN/Creatinine Ratio: 10 — ABNORMAL LOW (ref 12–28)
BUN: 13 mg/dL (ref 8–27)
Bilirubin Total: 0.4 mg/dL (ref 0.0–1.2)
CO2: 21 mmol/L (ref 18–29)
Calcium: 9.7 mg/dL (ref 8.7–10.3)
Chloride: 95 mmol/L — ABNORMAL LOW (ref 96–106)
Creatinine, Ser: 1.27 mg/dL — ABNORMAL HIGH (ref 0.57–1.00)
GFR calc Af Amer: 48 mL/min/{1.73_m2} — ABNORMAL LOW (ref >59)
GFR calc non Af Amer: 42 mL/min/{1.73_m2} — ABNORMAL LOW (ref >59)
GLUCOSE: 90 mg/dL (ref 65–99)
Globulin, Total: 3.6 g/dL (ref 1.5–4.5)
Potassium: 3.5 mmol/L (ref 3.5–5.2)
Sodium: 137 mmol/L (ref 134–144)
Total Protein: 7.8 g/dL (ref 6.0–8.5)

## 2016-04-02 LAB — CBC WITH DIFFERENTIAL/PLATELET
BASOS ABS: 0 10*3/uL (ref 0.0–0.2)
Basos: 0 %
EOS (ABSOLUTE): 0.2 10*3/uL (ref 0.0–0.4)
Eos: 2 %
HEMOGLOBIN: 10.6 g/dL — AB (ref 11.1–15.9)
Hematocrit: 31.9 % — ABNORMAL LOW (ref 34.0–46.6)
Immature Grans (Abs): 0 10*3/uL (ref 0.0–0.1)
Immature Granulocytes: 0 %
LYMPHS ABS: 4 10*3/uL — AB (ref 0.7–3.1)
Lymphs: 38 %
MCH: 30.7 pg (ref 26.6–33.0)
MCHC: 33.2 g/dL (ref 31.5–35.7)
MCV: 93 fL (ref 79–97)
MONOCYTES: 10 %
Monocytes Absolute: 1.1 10*3/uL — ABNORMAL HIGH (ref 0.1–0.9)
NEUTROS PCT: 50 %
Neutrophils Absolute: 5.1 10*3/uL (ref 1.4–7.0)
Platelets: 514 10*3/uL — ABNORMAL HIGH (ref 150–379)
RBC: 3.45 x10E6/uL — AB (ref 3.77–5.28)
RDW: 14.6 % (ref 12.3–15.4)
WBC: 10.4 10*3/uL (ref 3.4–10.8)

## 2016-04-02 LAB — CANCER ANTIGEN 27.29: CA 27.29: 33.2 U/mL (ref 0.0–38.6)

## 2016-04-02 NOTE — Telephone Encounter (Signed)
Patient notified as instructed and verbalizes understanding.  

## 2016-04-02 NOTE — Telephone Encounter (Signed)
No answer on home number and not able to leave a message.   Message left on cell phone for patient to call the office.   We need to inform patient of Pre-admit appointment scheduled for Monday, 04-06-16 at 11:30 am check in at the [facility].  Also, need to inform patient that she needs to arrive at the radiology desk in the [facility] on 04-09-16 at 10:15 am.

## 2016-04-02 NOTE — Progress Notes (Signed)
Called patient today to establish navigation services.  Patient is newly diagnosed with breast cancer.  Initially had an abnormal mammogram in November 2017, but due to several hospitalizations for pancreatitis she has just been able to have her breast biopsy.  She is scheduled for surgery next week with Dr. Jamal Collin.  Will take educational literature to her on the 5th while she is in pre-op.  She is to call with any questions or needs.    Oncology Nurse Navigator Documentation  Navigator Location: [facility] (04/02/16 1600)   )Navigator Encounter Type: Introductory phone call (04/02/16 1600)   Abnormal Finding Date: 01/16/16 (04/02/16 1600) Confirmed Diagnosis Date: 03/27/16 (04/02/16 1600) Surgery Date: 04/09/16 (04/02/16 1600)    Acuity: Level 2 (04/02/16 1600)         Time Spent with Patient: 30 (04/02/16 1600)

## 2016-04-06 ENCOUNTER — Encounter: Payer: Self-pay | Admitting: Family Medicine

## 2016-04-06 ENCOUNTER — Encounter (INDEPENDENT_AMBULATORY_CARE_PROVIDER_SITE_OTHER): Payer: Self-pay

## 2016-04-06 ENCOUNTER — Encounter
Admission: RE | Admit: 2016-04-06 | Discharge: 2016-04-06 | Disposition: A | Discharge status: Home / Self Care | Payer: Medicare Other | Source: Ambulatory Visit | Attending: General Surgery | Admitting: General Surgery

## 2016-04-06 ENCOUNTER — Ambulatory Visit (INDEPENDENT_AMBULATORY_CARE_PROVIDER_SITE_OTHER): Payer: Medicare Other | Admitting: Family Medicine

## 2016-04-06 VITALS — BP 100/60 | HR 60 | Temp 98.0°F | Resp 16 | Ht 62.0 in | Wt 125.0 lb

## 2016-04-06 DIAGNOSIS — J431 Panlobular emphysema: Secondary | ICD-10-CM

## 2016-04-06 DIAGNOSIS — J449 Chronic obstructive pulmonary disease, unspecified: Secondary | ICD-10-CM | POA: Diagnosis not present

## 2016-04-06 DIAGNOSIS — Z170 Estrogen receptor positive status [ER+]: Secondary | ICD-10-CM | POA: Diagnosis not present

## 2016-04-06 DIAGNOSIS — I10 Essential (primary) hypertension: Secondary | ICD-10-CM | POA: Diagnosis not present

## 2016-04-06 DIAGNOSIS — Z87891 Personal history of nicotine dependence: Secondary | ICD-10-CM | POA: Diagnosis not present

## 2016-04-06 DIAGNOSIS — K265 Chronic or unspecified duodenal ulcer with perforation: Secondary | ICD-10-CM | POA: Diagnosis not present

## 2016-04-06 DIAGNOSIS — G4730 Sleep apnea, unspecified: Secondary | ICD-10-CM | POA: Diagnosis not present

## 2016-04-06 DIAGNOSIS — F329 Major depressive disorder, single episode, unspecified: Secondary | ICD-10-CM

## 2016-04-06 DIAGNOSIS — Z79899 Other long term (current) drug therapy: Secondary | ICD-10-CM | POA: Diagnosis not present

## 2016-04-06 DIAGNOSIS — I6529 Occlusion and stenosis of unspecified carotid artery: Secondary | ICD-10-CM

## 2016-04-06 DIAGNOSIS — E784 Other hyperlipidemia: Secondary | ICD-10-CM

## 2016-04-06 DIAGNOSIS — E7800 Pure hypercholesterolemia, unspecified: Secondary | ICD-10-CM | POA: Diagnosis not present

## 2016-04-06 DIAGNOSIS — K219 Gastro-esophageal reflux disease without esophagitis: Secondary | ICD-10-CM | POA: Diagnosis not present

## 2016-04-06 DIAGNOSIS — M069 Rheumatoid arthritis, unspecified: Secondary | ICD-10-CM

## 2016-04-06 DIAGNOSIS — C50211 Malignant neoplasm of upper-inner quadrant of right female breast: Secondary | ICD-10-CM | POA: Diagnosis not present

## 2016-04-06 DIAGNOSIS — F32A Depression, unspecified: Secondary | ICD-10-CM

## 2016-04-06 DIAGNOSIS — E7849 Other hyperlipidemia: Secondary | ICD-10-CM

## 2016-04-06 DIAGNOSIS — I739 Peripheral vascular disease, unspecified: Secondary | ICD-10-CM | POA: Diagnosis not present

## 2016-04-06 MED ORDER — LOSARTAN POTASSIUM 50 MG PO TABS: 50.0000 mg | ORAL_TABLET | Freq: Every day | ORAL | 3 refills | Status: DC

## 2016-04-06 MED ORDER — TRAMADOL HCL 50 MG PO TABS: 50.0000 mg | ORAL_TABLET | Freq: Four times a day (QID) | ORAL | 3 refills | Status: DC | PRN

## 2016-04-06 NOTE — Progress Notes (Signed)
Name: Elizabeth Bailey   MRN: [id number]    DOB: May 31, 1940   Date:04/06/2016       Progress Note  Subjective  Chief Complaint  Chief Complaint  Patient presents with  . Hypertension  . Hyperlipidemia  . Breast Cancer  . Peptic Ulcer Disease    HPI Here for f/u of HBP and elevated lipids.  She has recently been dx'd with PUD and then recently dx'd  With R breast cancer.  Has seen Dr. Jamal Collin re: the cancer.  She has stopped her Lexapro because of side effects.  She states that she is not very depressed at this time.   She is also off opoids for her chronic shoulder pain.  Can take Tylenol only.  Shoulder pain is still keeping her awake at night.  No problem-specific Assessment & Plan notes found for this encounter.   Past Medical History:  Diagnosis Date  . Arthritis   . Asthma   . Chronic bronchitis ([place])   . Collagen vascular disease ([place])   . Depression   . Diverticulosis   . GERD (gastroesophageal reflux disease)   . High cholesterol   . Hypertension   . Seasonal allergies     Past Surgical History:  Procedure Laterality Date  . ABDOMINAL HYSTERECTOMY    . BREAST BIOPSY  1970's  . BREAST BIOPSY Right 03/27/2016   INVASIVE MAMMARY CARCINOMA  . COLONOSCOPY  2005  . DG  BONE DENSITY ([place] [facility])  2010  . DIAGNOSTIC MAMMOGRAM  2015  . EUS N/A 03/12/2016   Procedure: FULL UPPER ENDOSCOPIC ULTRASOUND (EUS) RADIAL;  Surgeon: Holly Bodily, MD;  Location: [facility] ENDOSCOPY;  Service: Endoscopy;  Laterality: N/A;  . GALLBLADDER SURGERY    . PANCREAS SURGERY  01/24/2016   Dr Burt Knack  . pap smear  2014  . REPAIR OF PERFORATED ULCER N/A 01/24/2016   Procedure: Exploratory Laparotomy with biopsy of Pancreas;  Surgeon: Florene Glen, MD;  Location: [facility] ORS;  Service: General;  Laterality: N/A;  . UPPER GI ENDOSCOPY  03/12/2016   Dr Mont Dutton    Family History  Problem Relation Age of Onset  . Pneumonia Mother   . Depression Mother   . Depression Sister   . Breast  cancer Sister 32  . Diabetes Sister   . Stroke Brother   . Heart disease Brother     Social History   Social History  . Marital status: Widowed    Spouse name: N/A  . Number of children: N/A  . Years of education: N/A   Occupational History  . Not on file.   Social History Main Topics  . Smoking status: Former Smoker    Packs/day: 0.50    Years: 10.00    Types: Cigarettes  . Smokeless tobacco: Never Used     Comment: quit 1988  . Alcohol use No     Comment: wine twice a month   . Drug use: No  . Sexual activity: No   Other Topics Concern  . Not on file   Social History Narrative  . No narrative on file     Current Outpatient Prescriptions:  .  acetaminophen (TYLENOL) 325 MG tablet, Take 650 mg by mouth every 6 (six) hours as needed for mild pain., Disp: , Rfl:  .  albuterol (PROVENTIL HFA;VENTOLIN HFA) 108 (90 Base) MCG/ACT inhaler, Inhale 2 puffs into the lungs every 6 (six) hours as needed for wheezing or shortness of breath., Disp: , Rfl:  .  Amino Acids-Protein Hydrolys (FEEDING SUPPLEMENT, PRO-STAT SUGAR FREE 64,) LIQD, Take 30 mLs by mouth 3 (three) times daily., Disp: 900 mL, Rfl: 0 .  Biotin w/ Vitamins C & E (HAIR/SKIN/NAILS PO), Take 1 tablet by mouth 2 (two) times daily., Disp: , Rfl:  .  bisacodyl (DULCOLAX) 10 MG suppository, Place 1 suppository (10 mg total) rectally daily as needed for moderate constipation., Disp: 12 suppository, Rfl: 0 .  budesonide-formoterol (SYMBICORT) 160-4.5 MCG/ACT inhaler, Inhale 1 puff into the lungs 2 (two) times daily., Disp: 1 Inhaler, Rfl: 12 .  diphenhydramine-acetaminophen (TYLENOL PM) 25-500 MG TABS tablet, Take 1-2 tablets by mouth at bedtime as needed (SLEEP). 1-2 TABLETS DEPENDING ON DIFFICULTLY SLEEPING, Disp: , Rfl:  .  esomeprazole (NEXIUM) 40 MG capsule, Take 1 capsule (40 mg total) by mouth 2 (two) times daily before a meal., Disp: 60 capsule, Rfl: 6 .  feeding supplement (BOOST / RESOURCE BREEZE) LIQD, Take 1  Container by mouth 3 (three) times daily between meals., Disp: , Rfl: 0 .  fluticasone (FLONASE) 50 MCG/ACT nasal spray, Place 2 sprays into the nose daily as needed for allergies or rhinitis. , Disp: , Rfl:  .  HYDROcodone-acetaminophen (NORCO/VICODIN) 5-325 MG tablet, Take 1 tablet by mouth at bedtime as needed for moderate pain. , Disp: , Rfl:  .  Melatonin 10 MG TABS, Take 2 tablets by mouth at bedtime. Reported on 06/03/2015, Disp: , Rfl:  .  montelukast (SINGULAIR) 10 MG tablet, TAKE 1 TABLET BY MOUTH ONCE DAILY., Disp: 30 tablet, Rfl: 12 .  Omega 3 1000 MG CAPS, Take 1,000 mg by mouth daily. omega-3 fatty acids-vitamin E (FISH OIL) 1,000 mg, Disp: , Rfl:  .  ondansetron (ZOFRAN) 4 MG tablet, Take 1 tablet (4 mg total) by mouth every 6 (six) hours as needed for nausea., Disp: 20 tablet, Rfl: 0 .  polyethylene glycol (MIRALAX / GLYCOLAX) packet, Take 17 g by mouth daily. (Patient taking differently: Take 17 g by mouth daily as needed. ), Disp: 14 each, Rfl: 0 .  simvastatin (ZOCOR) 20 MG tablet, Take 1 tablet every other night. (Patient taking differently: Take 20 mg by mouth every other day. Take 1 tablet every night.), Disp: 30 tablet, Rfl: 12 .  Adalimumab (HUMIRA PEN) 40 MG/0.8ML PNKT, Inject 40 mg into the skin every 21 ( twenty-one) days. TAKES EVERY OTHER WEEK  LAST DOSE Bailey IN 01-2016, Disp: , Rfl:  .  losartan (COZAAR) 50 MG tablet, Take 1 tablet (50 mg total) by mouth daily., Disp: 90 tablet, Rfl: 3 .  traMADol (ULTRAM) 50 MG tablet, Take 1 tablet (50 mg total) by mouth every 6 (six) hours as needed., Disp: 90 tablet, Rfl: 3  Allergies  Allergen Reactions  . Cyclobenzaprine     Other reaction(s): Other (See Comments) Frequent urination  . Flucytosine Other (See Comments)    Sore mouth  . Fluticasone-Salmeterol Other (See Comments)    Other reaction(s): Other (See Comments) Sore mouth  . Naproxen Nausea Only    GI upset  . Other Other (See Comments)    Other reaction(s): Other  (See Comments) Frequent urination Other reaction(s): Other (See Comments) Frequent urination Frequent urination     Review of Systems  Constitutional: Negative for chills, fever, malaise/fatigue and weight loss.  HENT: Negative for hearing loss and tinnitus.   Eyes: Negative for blurred vision and double vision.  Respiratory: Negative for cough, shortness of breath and wheezing.   Cardiovascular: Negative for chest pain, palpitations and leg swelling.  Gastrointestinal: Negative for abdominal pain, blood in stool and heartburn.  Genitourinary: Negative for dysuria, frequency and urgency.  Musculoskeletal: Negative for back pain and myalgias.  Skin: Negative for rash.  Neurological: Positive for tingling. Negative for dizziness, tremors, weakness and headaches.      Objective  Vitals:   04/06/16 0834 04/06/16 0948  BP: (!) 102/54 100/60  Pulse: (!) 45 60  Resp: 16   Temp: 98 F (36.7 C)   TempSrc: Oral   Weight: 125 lb (56.7 kg)   Height: 5\' 2"  (1.575 m)     Physical Exam  Constitutional: She is oriented to person, place, and time and well-developed, well-nourished, and in no distress. No distress.  HENT:  Head: Normocephalic and atraumatic.  Eyes: Conjunctivae and EOM are normal. Pupils are equal, round, and reactive to light.  Neck: Normal range of motion. Neck supple. Carotid bruit is not present. No thyromegaly present.  Cardiovascular: Normal rate, regular rhythm and normal heart sounds.  Exam reveals no gallop and no friction rub.   No murmur heard. Pulmonary/Chest: Effort normal and breath sounds normal. No respiratory distress. She has no wheezes. She has no rales.  Abdominal: Soft. Bowel sounds are normal. She exhibits no distension, no abdominal bruit and no mass. There is no tenderness.  Musculoskeletal: She exhibits no edema.  Lymphadenopathy:    She has no cervical adenopathy.  Neurological: She is alert and oriented to person, place, and time.  Vitals  reviewed.         Assessment & Plan  Problem List Items Addressed This Visit      Cardiovascular and Mediastinum   Hypertension - Primary   Relevant Medications   losartan (COZAAR) 50 MG tablet   Carotid artery stenosis   Relevant Medications   losartan (COZAAR) 50 MG tablet     Respiratory   Pulmonary emphysema (HCC)     Digestive   Perforated duodenal ulcer (HCC)     Musculoskeletal and Integument   Rheumatoid arthritis (HCC)   Relevant Medications   traMADol (ULTRAM) 50 MG tablet     Other   Depression   Hyperlipidemia   Relevant Medications   losartan (COZAAR) 50 MG tablet      Meds ordered this encounter  Medications  . losartan (COZAAR) 50 MG tablet    Sig: Take 1 tablet (50 mg total) by mouth daily.    Dispense:  90 tablet    Refill:  3  . traMADol (ULTRAM) 50 MG tablet    Sig: Take 1 tablet (50 mg total) by mouth every 6 (six) hours as needed.    Dispense:  90 tablet    Refill:  3   1. Essential hypertension Discontinue Amlodipine and Losartan/HCTZ. - losartan (COZAAR) 50 MG tablet; Take 1 tablet (50 mg total) by mouth daily.  Dispense: 90 tablet; Refill: 3  2. Stenosis of carotid artery, unspecified laterality   3. 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 Rheumatoid arthritis involving multiple sites, unspecified rheumatoid factor presence (HCC) Off Opoids at present - traMADol (ULTRAM) 50 MG tablet; Take 1 tablet (50 mg total) by mouth every 6 (six) hours as needed.  Dispense: 90 tablet; Refill: 3  6. Other hyperlipidemia Cont  Every other night.  7. Depression, unspecified depression type

## 2016-04-06 NOTE — Patient Instructions (Signed)
  Your procedure is scheduled on: Thurs. 04/09/16 Report to Day Surgery. To find out your arrival time please call [phone] between [place] on Wed. 04/08/16.  Remember: Instructions that are not followed completely may result in serious medical risk, up to and including death, or upon the discretion of your surgeon and anesthesiologist your surgery may need to be rescheduled.    __x__ 1. Do not eat food or drink liquids after midnight. No gum chewing or hard candies.     __x__ 2. No Alcohol for 24 hours before or after surgery.   ____ 3. Do Not Smoke For 24 Hours Prior to Your Surgery.   ____ 4. Bring all medications with you on the day of surgery if instructed.    __x__ 5. Notify your doctor if there is any change in your medical condition     (cold, fever, infections).       Do not wear jewelry, make-up, hairpins, clips or nail polish.  Do not wear lotions, powders, or perfumes. You may wear deodorant.  Do not shave 48 hours prior to surgery. Men may shave face and neck.  Do not bring valuables to the hospital.    [facility] is not responsible for any belongings or valuables.               Contacts, dentures or bridgework may not be worn into surgery.  Leave your suitcase in the car. After surgery it may be brought to your room.  For patients admitted to the hospital, discharge time is determined by your                treatment team.   Patients discharged the day of surgery will not be allowed to drive home.   Please read over the following fact sheets that you were given:      _x___ Take these medicines the morning of surgery with A SIP OF WATER:    1. Pain medication if needed  2. albuterol (PROVENTIL HFA;VENTOLIN HFA) 108 (90 Base) MCG/ACT inhaler bring with you  3. budesonide-formoterol (SYMBICORT) 160-4.5 MCG/ACT inhaler  4.esomeprazole (NEXIUM) 40 MG capsule  5.fluticasone (FLONASE) 50 MCG/ACT nasal spray  6.losartan (COZAAR) 50 MG tablet  ____ Fleet Enema (as  directed)   _x___ Use CHG Soap as directed  ____ Use inhalers on the day of surgery  ____ Stop metformin 2 days prior to surgery    ____ Take 1/2 of usual insulin dose the night before surgery and none on the morning of surgery.   ____ Stop Coumadin/Plavix/aspirin on   ____ Stop Anti-inflammatories on    __x__ Stop supplements until after surgery.  Omega 3 1000 MG CAPS, Melatonin 10 MG TABS today  ____ Bring C-Pap to the hospital.

## 2016-04-08 LAB — SURGICAL PATHOLOGY

## 2016-04-08 MED ORDER — CEFAZOLIN SODIUM-DEXTROSE 2-4 GM/100ML-% IV SOLN
2.0000 g | INTRAVENOUS | Status: AC
  Administered 2016-04-09: 2 g via INTRAVENOUS

## 2016-04-09 ENCOUNTER — Encounter
Admission: RE | Disposition: A | Discharge status: Home / Self Care | Payer: Self-pay | Source: Ambulatory Visit | Attending: General Surgery

## 2016-04-09 ENCOUNTER — Ambulatory Visit
Admission: RE | Admit: 2016-04-09 | Discharge: 2016-04-09 | Disposition: A | Discharge status: Home / Self Care | Payer: Medicare Other | Source: Ambulatory Visit | Attending: General Surgery | Admitting: General Surgery

## 2016-04-09 ENCOUNTER — Encounter: Payer: Self-pay | Admitting: *Deleted

## 2016-04-09 ENCOUNTER — Ambulatory Visit: Payer: Medicare Other | Admitting: Registered Nurse

## 2016-04-09 ENCOUNTER — Encounter
Admission: RE | Admit: 2016-04-09 | Discharge: 2016-04-09 | Disposition: A | Discharge status: Home / Self Care | Payer: Medicare Other | Source: Ambulatory Visit | Attending: General Surgery | Admitting: General Surgery

## 2016-04-09 DIAGNOSIS — F329 Major depressive disorder, single episode, unspecified: Secondary | ICD-10-CM | POA: Insufficient documentation

## 2016-04-09 DIAGNOSIS — C50211 Malignant neoplasm of upper-inner quadrant of right female breast: Secondary | ICD-10-CM | POA: Diagnosis not present

## 2016-04-09 DIAGNOSIS — Z170 Estrogen receptor positive status [ER+]: Principal | ICD-10-CM

## 2016-04-09 DIAGNOSIS — I10 Essential (primary) hypertension: Secondary | ICD-10-CM | POA: Insufficient documentation

## 2016-04-09 DIAGNOSIS — Z87891 Personal history of nicotine dependence: Secondary | ICD-10-CM | POA: Insufficient documentation

## 2016-04-09 DIAGNOSIS — K219 Gastro-esophageal reflux disease without esophagitis: Secondary | ICD-10-CM | POA: Insufficient documentation

## 2016-04-09 DIAGNOSIS — G4730 Sleep apnea, unspecified: Secondary | ICD-10-CM | POA: Insufficient documentation

## 2016-04-09 DIAGNOSIS — I739 Peripheral vascular disease, unspecified: Secondary | ICD-10-CM | POA: Insufficient documentation

## 2016-04-09 DIAGNOSIS — J449 Chronic obstructive pulmonary disease, unspecified: Secondary | ICD-10-CM | POA: Insufficient documentation

## 2016-04-09 DIAGNOSIS — E7800 Pure hypercholesterolemia, unspecified: Secondary | ICD-10-CM | POA: Insufficient documentation

## 2016-04-09 DIAGNOSIS — Z79899 Other long term (current) drug therapy: Secondary | ICD-10-CM | POA: Insufficient documentation

## 2016-04-09 HISTORY — PX: BREAST LUMPECTOMY: SHX2

## 2016-04-09 HISTORY — PX: BREAST LUMPECTOMY WITH SENTINEL LYMPH NODE BIOPSY: SHX5597

## 2016-04-09 SURGERY — BREAST LUMPECTOMY WITH SENTINEL LYMPH NODE BX
Anesthesia: General | Site: Breast | Laterality: Right | Wound class: Clean

## 2016-04-09 MED ORDER — CHLORHEXIDINE GLUCONATE CLOTH 2 % EX PADS: 6.0000 | MEDICATED_PAD | Freq: Once | CUTANEOUS | Status: DC

## 2016-04-09 MED ORDER — FENTANYL CITRATE (PF) 100 MCG/2ML IJ SOLN
INTRAMUSCULAR | Status: AC
  Filled 2016-04-09: qty 2

## 2016-04-09 MED ORDER — PROPOFOL 10 MG/ML IV BOLUS
INTRAVENOUS | Status: AC
  Filled 2016-04-09: qty 20

## 2016-04-09 MED ORDER — METHYLENE BLUE 0.5 % INJ SOLN
INTRAVENOUS | Status: AC
  Filled 2016-04-09: qty 10

## 2016-04-09 MED ORDER — TRAMADOL HCL 50 MG PO TABS: 50.0000 mg | ORAL_TABLET | Freq: Four times a day (QID) | ORAL | 0 refills | Status: DC | PRN

## 2016-04-09 MED ORDER — LIDOCAINE HCL (PF) 2 % IJ SOLN
INTRAMUSCULAR | Status: AC
  Filled 2016-04-09: qty 2

## 2016-04-09 MED ORDER — BUPIVACAINE HCL 0.5 % IJ SOLN
INTRAMUSCULAR | Status: DC | PRN
  Administered 2016-04-09: 20 mL

## 2016-04-09 MED ORDER — DEXAMETHASONE SODIUM PHOSPHATE 10 MG/ML IJ SOLN
INTRAMUSCULAR | Status: DC | PRN
  Administered 2016-04-09: 5 mg via INTRAVENOUS

## 2016-04-09 MED ORDER — ONDANSETRON HCL 4 MG/2ML IJ SOLN
INTRAMUSCULAR | Status: AC
  Filled 2016-04-09: qty 2

## 2016-04-09 MED ORDER — LIDOCAINE HCL (CARDIAC) 20 MG/ML IV SOLN
INTRAVENOUS | Status: DC | PRN
  Administered 2016-04-09: 80 mg via INTRAVENOUS

## 2016-04-09 MED ORDER — FENTANYL CITRATE (PF) 100 MCG/2ML IJ SOLN
INTRAMUSCULAR | Status: AC
  Administered 2016-04-09: 25 ug via INTRAVENOUS
  Filled 2016-04-09: qty 2

## 2016-04-09 MED ORDER — PHENYLEPHRINE HCL 10 MG/ML IJ SOLN
INTRAMUSCULAR | Status: DC | PRN
Start: 2016-04-09 — End: 2016-04-09
  Administered 2016-04-09: 100 ug via INTRAVENOUS
  Administered 2016-04-09: 200 ug via INTRAVENOUS
  Administered 2016-04-09: 100 ug via INTRAVENOUS
  Administered 2016-04-09: 200 ug via INTRAVENOUS
  Administered 2016-04-09 (×3): 100 ug via INTRAVENOUS

## 2016-04-09 MED ORDER — FENTANYL CITRATE (PF) 100 MCG/2ML IJ SOLN
25.0000 ug | INTRAMUSCULAR | Status: DC | PRN
  Administered 2016-04-09 (×4): 25 ug via INTRAVENOUS

## 2016-04-09 MED ORDER — GLYCOPYRROLATE 0.2 MG/ML IJ SOLN
INTRAMUSCULAR | Status: AC
  Filled 2016-04-09: qty 1

## 2016-04-09 MED ORDER — MIDAZOLAM HCL 2 MG/2ML IJ SOLN
INTRAMUSCULAR | Status: DC | PRN
  Administered 2016-04-09: 1 mg via INTRAVENOUS

## 2016-04-09 MED ORDER — ONDANSETRON HCL 4 MG/2ML IJ SOLN
INTRAMUSCULAR | Status: DC | PRN
  Administered 2016-04-09: 4 mg via INTRAVENOUS

## 2016-04-09 MED ORDER — SODIUM CHLORIDE 0.9 % IJ SOLN
INTRAMUSCULAR | Status: AC
  Filled 2016-04-09: qty 10

## 2016-04-09 MED ORDER — CEFAZOLIN SODIUM-DEXTROSE 2-4 GM/100ML-% IV SOLN
INTRAVENOUS | Status: AC
  Administered 2016-04-09: 2 g via INTRAVENOUS
  Filled 2016-04-09: qty 100

## 2016-04-09 MED ORDER — BUPIVACAINE HCL (PF) 0.5 % IJ SOLN
INTRAMUSCULAR | Status: AC
Start: 2016-04-09 — End: 2016-04-09
  Filled 2016-04-09: qty 30

## 2016-04-09 MED ORDER — PROPOFOL 10 MG/ML IV BOLUS
INTRAVENOUS | Status: DC | PRN
Start: 2016-04-09 — End: 2016-04-09
  Administered 2016-04-09: 30 mg via INTRAVENOUS
  Administered 2016-04-09: 20 mg via INTRAVENOUS
  Administered 2016-04-09: 100 mg via INTRAVENOUS

## 2016-04-09 MED ORDER — DEXAMETHASONE SODIUM PHOSPHATE 10 MG/ML IJ SOLN
INTRAMUSCULAR | Status: AC
  Filled 2016-04-09: qty 1

## 2016-04-09 MED ORDER — MIDAZOLAM HCL 2 MG/2ML IJ SOLN
INTRAMUSCULAR | Status: AC
  Filled 2016-04-09: qty 2

## 2016-04-09 MED ORDER — ONDANSETRON HCL 4 MG/2ML IJ SOLN: 4.0000 mg | Freq: Once | INTRAMUSCULAR | Status: DC | PRN

## 2016-04-09 MED ORDER — KETOROLAC TROMETHAMINE 30 MG/ML IJ SOLN
INTRAMUSCULAR | Status: AC
  Filled 2016-04-09: qty 1

## 2016-04-09 MED ORDER — LACTATED RINGERS IV SOLN
INTRAVENOUS | Status: DC | PRN
  Administered 2016-04-09: 13:00:00 via INTRAVENOUS

## 2016-04-09 MED ORDER — GLYCOPYRROLATE 0.2 MG/ML IJ SOLN
INTRAMUSCULAR | Status: DC | PRN
  Administered 2016-04-09: 0.2 mg via INTRAVENOUS

## 2016-04-09 MED ORDER — FENTANYL CITRATE (PF) 100 MCG/2ML IJ SOLN
INTRAMUSCULAR | Status: DC | PRN
  Administered 2016-04-09 (×4): 25 ug via INTRAVENOUS

## 2016-04-09 MED ORDER — TECHNETIUM TC 99M SULFUR COLLOID FILTERED
1.0000 | Freq: Once | INTRAVENOUS | Status: AC | PRN
  Administered 2016-04-09: 0.84 via INTRADERMAL

## 2016-04-09 SURGICAL SUPPLY — 38 items
BLADE SURG 15 STRL SS SAFETY (BLADE) ×4 IMPLANT
BULB RESERV EVAC DRAIN JP 100C (MISCELLANEOUS) IMPLANT
CANISTER SUCT 1200ML W/VALVE (MISCELLANEOUS) ×2 IMPLANT
CHLORAPREP W/TINT 26ML (MISCELLANEOUS) ×2 IMPLANT
CNTNR SPEC 2.5X3XGRAD LEK (MISCELLANEOUS) ×4
CONT SPEC 4OZ STER OR WHT (MISCELLANEOUS) ×4
CONTAINER SPEC 2.5X3XGRAD LEK (MISCELLANEOUS) ×4 IMPLANT
COVER PROBE FLX POLY STRL (MISCELLANEOUS) ×2 IMPLANT
DECANTER SPIKE VIAL GLASS SM (MISCELLANEOUS) ×2 IMPLANT
DERMABOND ADVANCED (GAUZE/BANDAGES/DRESSINGS) ×1
DERMABOND ADVANCED .7 DNX12 (GAUZE/BANDAGES/DRESSINGS) ×1 IMPLANT
DEVICE DUBIN SPECIMEN MAMMOGRA (MISCELLANEOUS) ×2 IMPLANT
DEVICE LOCALIZATION ULTRAWIRE (WIRE) ×1 IMPLANT
DRAIN CHANNEL JP 15F RND 16 (MISCELLANEOUS) IMPLANT
DRAPE LAPAROTOMY TRNSV 106X77 (MISCELLANEOUS) ×2 IMPLANT
ELECT CAUTERY BLADE TIP 2.5 (TIP) ×2
ELECT REM PT RETURN 9FT ADLT (ELECTROSURGICAL) ×2
ELECTRODE CAUTERY BLDE TIP 2.5 (TIP) ×1 IMPLANT
ELECTRODE REM PT RTRN 9FT ADLT (ELECTROSURGICAL) ×1 IMPLANT
GLOVE BIO SURGEON STRL SZ7 (GLOVE) ×2 IMPLANT
GOWN STRL REUS W/ TWL LRG LVL3 (GOWN DISPOSABLE) ×3 IMPLANT
GOWN STRL REUS W/TWL LRG LVL3 (GOWN DISPOSABLE) ×3
HARMONIC SCALPEL FOCUS (MISCELLANEOUS) IMPLANT
KIT RM TURNOVER STRD PROC AR (KITS) ×2 IMPLANT
LABEL OR SOLS (LABEL) ×2 IMPLANT
MARGIN MAP 10MM (MISCELLANEOUS) ×2 IMPLANT
NDL SAFETY 22GX1.5 (NEEDLE) ×2 IMPLANT
NEEDLE HYPO 25X1 1.5 SAFETY (NEEDLE) ×2 IMPLANT
PACK BASIN MINOR ARMC (MISCELLANEOUS) ×2 IMPLANT
SLEVE PROBE SENORX GAMMA FIND (MISCELLANEOUS) ×2 IMPLANT
STRIP CLOSURE SKIN 1/2X4 (GAUZE/BANDAGES/DRESSINGS) ×2 IMPLANT
SUT ETH BLK MONO 3 0 FS 1 12/B (SUTURE) IMPLANT
SUT MNCRL AB 3-0 PS2 27 (SUTURE) ×2 IMPLANT
SUT VIC AB 2-0 BRD 54 (SUTURE) ×2 IMPLANT
SUT VIC AB 2-0 CT2 27 (SUTURE) ×2 IMPLANT
SYRINGE 10CC LL (SYRINGE) ×2 IMPLANT
ULTRAWIRE LOCALIZATION DEVICE (WIRE) ×2
WATER STERILE IRR 1000ML POUR (IV SOLUTION) ×2 IMPLANT

## 2016-04-09 NOTE — Op Note (Signed)
Preop diagnosis: Carcinoma right breast upper inner quadrant  Post op diagnosis: Same  Operation: Right breast lumpectomy with sentinel node biopsy  Surgeon: Mckinley Jewel  Assistant:     Anesthesia: Gen.  Complications: None  EBL: Less than 10 mL  Drains: None  Description: Patient underwent nuclear contrast injection preoperatively. She was then brought to the operating room and put to sleep with endotracheal tube. The right breast and axilla were prepped and draped as sterile field. Since the patient had very strong signals on the Gamma finder in the axilla methylene blue was not utilized. Timeout was performed. Initially the sentinel node dissection was performed. Skin incision was made along the inferior aspect of the axilla in a transverse orientation about 2-1/2 cm in length and this was deepened through to the axillary fat pad and bleeding controlled cautery. The Gamma finder was then utilized and 4 nodes were identified 1 which is about 5 mm to 6 mm in size that had intense signal activity. 3 others were about 3 mm in size and that all has signal activity that was fairly strong. After removal of these 4 nodes labeled as 123 and 4 dictated signal activity completely died in the axilla. And visibly there were no nodes nor palpable nodes. Ultrasound probe was brought up to the field and the mass in question located just outside the areola at the 2:00 location on the right was identified along with the adjacent clip. With a small stab incision and Bard Ultrawire was placed going through this lesion. Following this a circumareolar incision along the upper inner quadrant was made extending from the wire entrance side on the medial aspect of the areola. The skin was then elevated along with the fat in the medial superior location direction and also little bit towards the nipple area. With the wire as a guide palpation was then utilized to identify the small mass in question about a centimeter in  size in this area was circumferentially freed and removed. The excised tissue was tagged for margins and sent to pathology.  Both wounds irrigated. Totaling 20 mL of 0.5% Marcaine was instilled around  both incisions for analgesia. The deep tissue in the axilla closed with 2-0 Vicryl and the skin closed with subcuticular 4-0 Monocryl. Similarly the deeper layers including the gland the subcutaneous tissue were closed with 2-0 Vicryl of the lumpectomy site. Skin closed with subcuticular 4-0 Monocryl. Dermabond was applied to both incisions. Patient subsequently extubated and returned recovery room stable condition.

## 2016-04-09 NOTE — Interval H&P Note (Signed)
History and Physical Interval Note:  04/09/2016 12:21 PM  Elizabeth Bailey  has presented today for surgery, with the diagnosis of cancer right breast  The various methods of treatment have been discussed with the patient and family. After consideration of risks, benefits and other options for treatment, the patient has consented to  Procedure(s): BREAST LUMPECTOMY WITH SENTINEL LYMPH NODE BX (Right) as a surgical intervention .  The patient's history has been reviewed, patient examined, no change in status, stable for surgery.  I have reviewed the patient's chart and labs.  Questions were answered to the patient's satisfaction.     SANKAR,SEEPLAPUTHUR G

## 2016-04-09 NOTE — Transfer of Care (Signed)
Immediate Anesthesia Transfer of Care Note  Patient: Elizabeth Bailey  Procedure(s) Performed: Procedure(s): BREAST LUMPECTOMY WITH SENTINEL LYMPH NODE BX (Right)  Patient Location: PACU  Anesthesia Type:General  Level of Consciousness: sedated  Airway & Oxygen Therapy: Patient Spontanous Breathing and Patient connected to face mask oxygen  Post-op Assessment: Report given to RN and Post -op Vital signs reviewed and stable  Post vital signs: Reviewed and stable  Last Vitals:  Vitals:   04/09/16 1132 04/09/16 1457  BP: (!) 133/58 135/69  Pulse: 74 90  Resp: 16 16  Temp: [id number] C 37 C    Complications: No apparent anesthesia complications

## 2016-04-09 NOTE — Anesthesia Procedure Notes (Signed)
Procedure Name: LMA Insertion Date/Time: 04/09/2016 1:20 PM Performed by: Doreen Salvage Pre-anesthesia Checklist: Patient identified, Patient being monitored, Timeout performed, Emergency Drugs available and Suction available Patient Re-evaluated:Patient Re-evaluated prior to inductionOxygen Delivery Method: Circle system utilized Preoxygenation: Pre-oxygenation with 100% oxygen Intubation Type: IV induction Ventilation: Mask ventilation without difficulty LMA: LMA inserted LMA Size: 3.5 Tube type: Oral Number of attempts: 1 Placement Confirmation: positive ETCO2 and breath sounds checked- equal and bilateral Tube secured with: Tape Dental Injury: Teeth and Oropharynx as per pre-operative assessment

## 2016-04-09 NOTE — Anesthesia Post-op Follow-up Note (Cosign Needed)
Anesthesia QCDR form completed.        

## 2016-04-09 NOTE — Discharge Instructions (Signed)

## 2016-04-09 NOTE — H&P (View-Only) (Signed)
Patient ID: Elizabeth Bailey, female   DOB: 10/06/1940, 76 y.o.   MRN: [id number]  Chief Complaint  Patient presents with  . Breast Problem    diagnosis of breast cancer    HPI Elizabeth Bailey is a 76 y.o. female.  who presents for a breast evaluation. The most recent mammogram and biopsy was done on 03-27-16 showing invasive carcinoma in the right breast.  Patient does perform regular self breast checks and gets regular mammograms done.   She denies any breast symptoms. Her initial mammogram was 2 mos ago but she developed abdominal pain with subsequent laparotomy for suspected perforated duodenal ulcer. At surgery no perforation was noted. Subsequent endoscopy showed a duodenal ulcer. I have reviewed the history of present illness with the patient.    HPI  Past Medical History:  Diagnosis Date  . Arthritis   . Asthma   . Chronic bronchitis (HCC)   . Collagen vascular disease (HCC)   . Depression   . Diverticulosis   . GERD (gastroesophageal reflux disease)   . High cholesterol   . Hypertension   . Seasonal allergies     Past Surgical History:  Procedure Laterality Date  . ABDOMINAL HYSTERECTOMY    . BREAST BIOPSY  1970's  . BREAST BIOPSY Right 03/27/2016   INVASIVE MAMMARY CARCINOMA  . COLONOSCOPY  2005  . DG  BONE DENSITY ([facility] HX)  2010  . DIAGNOSTIC MAMMOGRAM  2015  . EUS N/A 03/12/2016   Procedure: FULL UPPER ENDOSCOPIC ULTRASOUND (EUS) RADIAL;  Surgeon: Rebecca A Burbridge, MD;  Location: [facility] ENDOSCOPY;  Service: Endoscopy;  Laterality: N/A;  . GALLBLADDER SURGERY    . PANCREAS SURGERY  01/24/2016   Dr Cooper  . pap smear  2014  . REPAIR OF PERFORATED ULCER N/A 01/24/2016   Procedure: Exploratory Laparotomy with biopsy of Pancreas;  Surgeon: Richard E Cooper, MD;  Location: [facility] ORS;  Service: General;  Laterality: N/A;  . UPPER GI ENDOSCOPY  03/12/2016   Dr Burbridge    Family History  Problem Relation Age of Onset  . Pneumonia Mother   . Depression Mother   .  Depression Sister   . Breast cancer Sister 62  . Diabetes Sister   . Stroke Brother   . Heart disease Brother     Social History Social History  Substance Use Topics  . Smoking status: Former Smoker    Packs/day: 0.50    Years: 10.00    Types: Cigarettes  . Smokeless tobacco: Never Used     Comment: quit 1988  . Alcohol use No     Comment: wine twice a month     Allergies  Allergen Reactions  . Cyclobenzaprine     Other reaction(s): Other (See Comments) Frequent urination  . Flucytosine Other (See Comments)    Sore mouth  . Fluticasone-Salmeterol Other (See Comments)    Other reaction(s): Other (See Comments) Sore mouth  . Naproxen Nausea Only    GI upset  . Other Other (See Comments)    Other reaction(s): Other (See Comments) Frequent urination Other reaction(s): Other (See Comments) Frequent urination Frequent urination    Current Outpatient Prescriptions  Medication Sig Dispense Refill  . Adalimumab (HUMIRA PEN) 40 MG/0.8ML PNKT Inject 40 mg into the skin every 21 ( twenty-one) days. TAKES EVERY OTHER WEEK  LAST DOSE WAS IN 01-2016    . Amino Acids-Protein Hydrolys (FEEDING SUPPLEMENT, PRO-STAT SUGAR FREE 64,) LIQD Take 30 mLs by mouth 3 (three) times daily. (Patient   not taking: Reported on 04/03/2016) 900 mL 0  . amLODipine (NORVASC) 5 MG tablet TAKE 1 TABLET BY MOUTH ONCE DAILY. 30 tablet 12  . bisacodyl (DULCOLAX) 10 MG suppository Place 1 suppository (10 mg total) rectally daily as needed for moderate constipation. (Patient not taking: Reported on 04/03/2016) 12 suppository 0  . budesonide-formoterol (SYMBICORT) 160-4.5 MCG/ACT inhaler Inhale 1 puff into the lungs 2 (two) times daily. 1 Inhaler 12  . calcium-vitamin D (CALCIUM 500/D) 500-200 MG-UNIT tablet Take 1 tablet by mouth daily.    Marland Kitchen escitalopram (LEXAPRO) 20 MG tablet Take 1 tablet daily (Patient not taking: Reported on 04/03/2016) 30 tablet 12  . esomeprazole (NEXIUM) 20 MG capsule Take 1 capsule (20 mg  total) by mouth 2 (two) times daily before a meal. (Patient not taking: Reported on 04/03/2016) 60 capsule 0  . esomeprazole (NEXIUM) 40 MG capsule Take 1 capsule (40 mg total) by mouth 2 (two) times daily before a meal. 60 capsule 6  . feeding supplement (BOOST / RESOURCE BREEZE) LIQD Take 1 Container by mouth 3 (three) times daily between meals.  0  . fluticasone (FLONASE) 50 MCG/ACT nasal spray Place 2 sprays into the nose daily as needed for allergies or rhinitis.     Marland Kitchen HYDROcodone-acetaminophen (NORCO/VICODIN) 5-325 MG tablet Take 1 tablet by mouth at bedtime as needed for moderate pain.     Marland Kitchen losartan-hydrochlorothiazide (HYZAAR) 100-25 MG tablet TAKE 1 TABLET BY MOUTH ONCE DAILY. 30 tablet 12  . Melatonin 10 MG TABS Take 2 tablets by mouth at bedtime. Reported on 06/03/2015    . montelukast (SINGULAIR) 10 MG tablet TAKE 1 TABLET BY MOUTH ONCE DAILY. 30 tablet 12  . Multiple Vitamin (MULTI-VITAMINS) TABS Take 1 tablet by mouth daily.    . Omega 3 1000 MG CAPS Take 1,000 mg by mouth daily. omega-3 fatty acids-vitamin E (FISH OIL) 1,000 mg    . ondansetron (ZOFRAN) 4 MG tablet Take 1 tablet (4 mg total) by mouth every 6 (six) hours as needed for nausea. 20 tablet 0  . polyethylene glycol (MIRALAX / GLYCOLAX) packet Take 17 g by mouth daily. (Patient not taking: Reported on 04/03/2016) 14 each 0  . simvastatin (ZOCOR) 20 MG tablet Take 1 tablet every other night. (Patient taking differently: Take 20 mg by mouth every other day. Take 1 tablet every night.) 30 tablet 12  . acetaminophen (TYLENOL) 325 MG tablet Take 650 mg by mouth every 6 (six) hours as needed for mild pain.    Marland Kitchen albuterol (PROVENTIL HFA;VENTOLIN HFA) 108 (90 Base) MCG/ACT inhaler Inhale 2 puffs into the lungs every 6 (six) hours as needed for wheezing or shortness of breath.    . Biotin w/ Vitamins C & E (HAIR/SKIN/NAILS PO) Take 1 tablet by mouth 2 (two) times daily.    . diphenhydramine-acetaminophen (TYLENOL PM) 25-500 MG TABS tablet  Take 1-2 tablets by mouth at bedtime as needed (SLEEP). 1-2 TABLETS DEPENDING ON DIFFICULTLY SLEEPING    . pantoprazole (PROTONIX) 40 MG tablet Take 1 tablet (40 mg total) by mouth daily. 30 tablet 3   No current facility-administered medications for this visit.     Review of Systems Review of Systems  Constitutional: Negative.   Respiratory: Negative.   Cardiovascular: Negative.     Blood pressure 110/70, pulse 78, resp. rate 18, height _0  (1.575 m), weight 126 lb (57.2 kg).  Physical Exam Physical Exam  Constitutional: She is oriented to person, place, and time. She appears well-developed and well-nourished.  Eyes: Conjunctivae are normal. No scleral icterus.  Neck: Neck supple.  Cardiovascular: Normal rate, regular rhythm and normal heart sounds.   Pulmonary/Chest: Effort normal and breath sounds normal. Right breast exhibits mass. Right breast exhibits no inverted nipple, no nipple discharge, no skin change and no tenderness. Left breast exhibits no inverted nipple, no mass, no nipple discharge, no skin change and no tenderness.    Lymphadenopathy:    She has no cervical adenopathy.  Neurological: She is alert and oriented to person, place, and time.  Skin: Skin is warm and dry.  Psychiatric: Her behavior is normal.    Data Reviewed  Mammogram, [place]  and pathology reviewed 1.6cm mass UIQ right breast. [place] of axilla was negative Clinical T1c, N0 Assessment    Invasive mammary carcinoma, right breast ER/PR positive, HER2 2+, FISH pending    Plan   Discussed treatment options with pt- surgery, radiation and antihormonal therapy. Possible need for chemo based on final Her 2 value and lymph node involvement. She is agreeable to wide excision and SN biopsy     Educated patient on procedure, benefits and risks. Patient vocalized understanding.  CBC, CMP, and CA 27-29 ordered.    Patient's surgery has been scheduled for 04-09-16 at [facility].   This information has been  scribed by Karie Fetch RN, BSN,BC.   Ayeisha Lindenberger G 04/03/2016, 8:51 AM

## 2016-04-09 NOTE — Anesthesia Preprocedure Evaluation (Signed)
Anesthesia Evaluation  Patient identified by MRN, date of birth, ID band Patient awake    Reviewed: Allergy & Precautions, H&P , NPO status , Patient's Chart, lab work & pertinent test results, reviewed documented beta blocker date and time   Airway Mallampati: II   Neck ROM: full    Dental  (+) Poor Dentition   Pulmonary neg pulmonary ROS, neg shortness of breath, asthma , sleep apnea , COPD, former smoker,    Pulmonary exam normal        Cardiovascular Exercise Tolerance: Poor hypertension, + Peripheral Vascular Disease  negative cardio ROS Normal cardiovascular exam Rhythm:regular Rate:Normal  Recently withdrawn antihypertensive support secondary to hypotension. JA   Neuro/Psych  Neuromuscular disease negative neurological ROS  negative psych ROS   GI/Hepatic negative GI ROS, Neg liver ROS, PUD, GERD  Medicated,  Endo/Other  negative endocrine ROS  Renal/GU negative Renal ROS  negative genitourinary   Musculoskeletal   Abdominal   Peds  Hematology negative hematology ROS (+)   Anesthesia Other Findings Past Medical History: No date: Arthritis No date: Asthma No date: Chronic bronchitis (HCC) No date: Collagen vascular disease ([place]) No date: Depression No date: Diverticulosis No date: GERD (gastroesophageal reflux disease) No date: High cholesterol No date: Hypertension No date: Seasonal allergies Past Surgical History: No date: ABDOMINAL HYSTERECTOMY 1970's: BREAST BIOPSY 03/27/2016: BREAST BIOPSY Right     Comment: INVASIVE MAMMARY CARCINOMA No date: CAROTID ENDARTERECTOMY Left No date: CHOLECYSTECTOMY 2005: COLONOSCOPY 2010: DG  BONE DENSITY ([place] HX) 2015: DIAGNOSTIC MAMMOGRAM 03/12/2016: EUS N/A     Comment: Procedure: FULL UPPER ENDOSCOPIC ULTRASOUND               (EUS) RADIAL;  Surgeon: Holly Bodily,               MD;  Location: [facility] ENDOSCOPY;  Service:               Endoscopy;   Laterality: N/A; No date: GALLBLADDER SURGERY 01/24/2016: PANCREAS SURGERY     Comment: Dr Burt Knack 2014: pap smear 01/24/2016: REPAIR OF PERFORATED ULCER N/A     Comment: Procedure: Exploratory Laparotomy with biopsy               of Pancreas;  Surgeon: Florene Glen, MD;                Location: [facility] ORS;  Service: General;                Laterality: N/A; 03/12/2016: UPPER GI ENDOSCOPY     Comment: Dr Mont Dutton   Reproductive/Obstetrics negative OB ROS                             Anesthesia Physical Anesthesia Plan  ASA: III  Anesthesia Plan: General   Post-op Pain Management:    Induction:   Airway Management Planned:   Additional Equipment:   Intra-op Plan:   Post-operative Plan:   Informed Consent: I have reviewed the patients History and Physical, chart, labs and discussed the procedure including the risks, benefits and alternatives for the proposed anesthesia with the patient or authorized representative who has indicated his/her understanding and acceptance.   Dental Advisory Given  Plan Discussed with: CRNA  Anesthesia Plan Comments:         Anesthesia Quick Evaluation

## 2016-04-10 ENCOUNTER — Encounter: Payer: Self-pay | Admitting: General Surgery

## 2016-04-10 NOTE — Progress Notes (Signed)
Met patient prior to her surgery today.  Gave patient breast cancer educational literature, "My Breast Cancer Treatment Handbook" by Josephine Igo, RN.  Offered support.  She is to call with any questions or needs.

## 2016-04-10 NOTE — Anesthesia Postprocedure Evaluation (Signed)
Anesthesia Post Note  Patient: Elizabeth Bailey  Procedure(s) Performed: Procedure(s) (LRB): BREAST LUMPECTOMY WITH SENTINEL LYMPH NODE BX (Right)  Patient location during evaluation: PACU Anesthesia Type: General Level of consciousness: awake Pain management: pain level controlled Vital Signs Assessment: post-procedure vital signs reviewed and stable Respiratory status: spontaneous breathing Cardiovascular status: stable Anesthetic complications: no     Last Vitals:  Vitals:   04/09/16 1557 04/09/16 1655  BP: 118/68 116/63  Pulse:  (!) 55  Resp:  18  Temp:  36.7 C    Last Pain:  Vitals:   04/10/16 0849  TempSrc:   PainSc: 0-No pain                 VAN STAVEREN,Lanah Steines

## 2016-04-13 ENCOUNTER — Encounter: Payer: Self-pay | Admitting: Body Imaging

## 2016-04-16 ENCOUNTER — Ambulatory Visit (INDEPENDENT_AMBULATORY_CARE_PROVIDER_SITE_OTHER): Payer: Medicare Other | Admitting: General Surgery

## 2016-04-16 ENCOUNTER — Encounter: Payer: Self-pay | Admitting: General Surgery

## 2016-04-16 VITALS — BP 130/90 | HR 52 | Resp 12 | Ht 62.0 in | Wt 127.0 lb

## 2016-04-16 DIAGNOSIS — Z170 Estrogen receptor positive status [ER+]: Secondary | ICD-10-CM

## 2016-04-16 DIAGNOSIS — C50211 Malignant neoplasm of upper-inner quadrant of right female breast: Secondary | ICD-10-CM

## 2016-04-16 NOTE — Patient Instructions (Addendum)
The patient is aware to call back for any questions or concerns. Appointment with Dr Baruch Gouty for radiation therapy options

## 2016-04-16 NOTE — Progress Notes (Signed)
Patient ID: Elizabeth Bailey, female   DOB: Jul 13, 1940, 76 y.o.   MRN: [id number]  Chief Complaint  Patient presents with  . Routine Post Op    right lumpectomy    HPI Elizabeth Bailey is a 76 y.o. female here today for her post op right lumpectomy done on 04/09/2016. Patient states she is doing well.  I have reviewed the history of present illness with the patient.  HPI  Past Medical History:  Diagnosis Date  . Arthritis   . Asthma   . Cancer ([place]) 03/27/2016   Invasive mammary carcinoma right breast Stage 1 ER/PR positive  . Chronic bronchitis ([place])   . Collagen vascular disease ([place])   . Depression   . Diverticulosis   . GERD (gastroesophageal reflux disease)   . High cholesterol   . Hypertension   . Seasonal allergies     Past Surgical History:  Procedure Laterality Date  . ABDOMINAL HYSTERECTOMY    . BREAST BIOPSY  1970's  . BREAST BIOPSY Right 03/27/2016   INVASIVE MAMMARY CARCINOMA  . BREAST LUMPECTOMY WITH SENTINEL LYMPH NODE BIOPSY Right 04/09/2016   Procedure: BREAST LUMPECTOMY WITH SENTINEL LYMPH NODE BX;  Surgeon: Christene Lye, MD;  Location: [facility] ORS;  Service: General;  Laterality: Right;  . CAROTID ENDARTERECTOMY Left   . CHOLECYSTECTOMY    . COLONOSCOPY  2005  . DG  BONE DENSITY ([place] HX)  2010  . DIAGNOSTIC MAMMOGRAM  2015  . EUS N/A 03/12/2016   Procedure: FULL UPPER ENDOSCOPIC ULTRASOUND (EUS) RADIAL;  Surgeon: Holly Bodily, MD;  Location: [facility] ENDOSCOPY;  Service: Endoscopy;  Laterality: N/A;  . GALLBLADDER SURGERY    . PANCREAS SURGERY  01/24/2016   Dr Burt Knack  . pap smear  2014  . REPAIR OF PERFORATED ULCER N/A 01/24/2016   Procedure: Exploratory Laparotomy with biopsy of Pancreas;  Surgeon: Florene Glen, MD;  Location: [facility] ORS;  Service: General;  Laterality: N/A;  . UPPER GI ENDOSCOPY  03/12/2016   Dr Mont Dutton    Family History  Problem Relation Age of Onset  . Pneumonia Mother   . Depression Mother   . Depression Sister   .  Breast cancer Sister 31  . Diabetes Sister   . Stroke Brother   . Heart disease Brother     Social History Social History  Substance Use Topics  . Smoking status: Former Smoker    Packs/day: 0.50    Years: 10.00    Types: Cigarettes    Quit date: 1988  . Smokeless tobacco: Never Used     Comment: quit 1988  . Alcohol use No     Comment: wine twice a month     Allergies  Allergen Reactions  . Cyclobenzaprine     Other reaction(s): Other (See Comments) Frequent urination  . Flucytosine Other (See Comments)    Sore mouth  . Fluticasone-Salmeterol Other (See Comments)    Other reaction(s): Other (See Comments) Sore mouth  . Naproxen Nausea Only    GI upset    Current Outpatient Prescriptions  Medication Sig Dispense Refill  . acetaminophen (TYLENOL) 325 MG tablet Take 650 mg by mouth every 6 (six) hours as needed for mild pain.    . Adalimumab (HUMIRA PEN) 40 MG/0.8ML PNKT Inject 40 mg into the skin every 21 ( twenty-one) days. TAKES EVERY OTHER WEEK  LAST DOSE WAS IN 01-2016    . albuterol (PROVENTIL HFA;VENTOLIN HFA) 108 (90 Base) MCG/ACT inhaler Inhale 2 puffs into  the lungs every 6 (six) hours as needed for wheezing or shortness of breath.    . Amino Acids-Protein Hydrolys (FEEDING SUPPLEMENT, PRO-STAT SUGAR FREE 64,) LIQD Take 30 mLs by mouth 3 (three) times daily. 900 mL 0  . Biotin w/ Vitamins C & E (HAIR/SKIN/NAILS PO) Take 1 tablet by mouth 2 (two) times daily.    . bisacodyl (DULCOLAX) 10 MG suppository Place 1 suppository (10 mg total) rectally daily as needed for moderate constipation. 12 suppository 0  . budesonide-formoterol (SYMBICORT) 160-4.5 MCG/ACT inhaler Inhale 1 puff into the lungs 2 (two) times daily. 1 Inhaler 12  . diphenhydramine-acetaminophen (TYLENOL PM) 25-500 MG TABS tablet Take 1-2 tablets by mouth at bedtime as needed (SLEEP). 1-2 TABLETS DEPENDING ON DIFFICULTLY SLEEPING    . esomeprazole (NEXIUM) 40 MG capsule Take 1 capsule (40 mg total) by  mouth 2 (two) times daily before a meal. 60 capsule 6  . feeding supplement (BOOST / RESOURCE BREEZE) LIQD Take 1 Container by mouth 3 (three) times daily between meals.  0  . fluticasone (FLONASE) 50 MCG/ACT nasal spray Place 2 sprays into the nose daily as needed for allergies or rhinitis.     Marland Kitchen HYDROcodone-acetaminophen (NORCO/VICODIN) 5-325 MG tablet Take 1 tablet by mouth at bedtime as needed for moderate pain.     Marland Kitchen losartan (COZAAR) 50 MG tablet Take 1 tablet (50 mg total) by mouth daily. 90 tablet 3  . Melatonin 10 MG TABS Take 2 tablets by mouth at bedtime. Reported on 06/03/2015    . montelukast (SINGULAIR) 10 MG tablet TAKE 1 TABLET BY MOUTH ONCE DAILY. 30 tablet 12  . Omega 3 1000 MG CAPS Take 1,000 mg by mouth daily. omega-3 fatty acids-vitamin E (FISH OIL) 1,000 mg    . ondansetron (ZOFRAN) 4 MG tablet Take 1 tablet (4 mg total) by mouth every 6 (six) hours as needed for nausea. 20 tablet 0  . polyethylene glycol (MIRALAX / GLYCOLAX) packet Take 17 g by mouth daily. (Patient taking differently: Take 17 g by mouth daily as needed. ) 14 each 0  . simvastatin (ZOCOR) 20 MG tablet Take 1 tablet every other night. (Patient taking differently: Take 20 mg by mouth every other day. Take 1 tablet every night.) 30 tablet 12  . traMADol (ULTRAM) 50 MG tablet Take 1 tablet (50 mg total) by mouth every 6 (six) hours as needed. 90 tablet 3   No current facility-administered medications for this visit.     Review of Systems Review of Systems  Constitutional: Negative.   Respiratory: Negative.   Cardiovascular: Negative.     Blood pressure 130/90, pulse (!) 52, resp. rate 12, height 5' 2"  (1.575 m), weight 127 lb (57.6 kg).  Physical Exam Physical Exam  Constitutional: She is oriented to person, place, and time. She appears well-developed and well-nourished.  Cardiovascular: Normal rate, regular rhythm and normal heart sounds.   Pulmonary/Chest: Effort normal and breath sounds normal.   Right breast incision healing well, dermabond intact.  Neurological: She is alert and oriented to person, place, and time.  Skin: Skin is warm and dry.  Psychiatric: Her behavior is normal.    Data Reviewed Path reviewed -margins clear. SN all negative.   Assessment    Stable postoperative exam. Invasive mammary carcinoma Stage 1 ER/PR positive Her2 equivocal. Have ordered a Her mark test. If that is negative will consider Mammoprint. Discussed fully with pt and she is agreeable.  [place] arrange for radiation therapy in the interim  Plan        Appointment with Dr. Baruch Gouty at the [facility] for radiation therapy options. Patient has been scheduled for an appointment on 04-23-16 at 10 am. She is aware of date and time.   This information has been scribed by Gaspar Cola CMA.   Hevin Jeffcoat G 04/16/2016, 3:23 PM

## 2016-04-23 ENCOUNTER — Ambulatory Visit
Admission: RE | Admit: 2016-04-23 | Discharge: 2016-04-23 | Disposition: A | Discharge status: Home / Self Care | Payer: Medicare Other | Source: Ambulatory Visit | Attending: Radiation Oncology | Admitting: Radiation Oncology

## 2016-04-23 ENCOUNTER — Encounter: Payer: Self-pay | Admitting: Radiation Oncology

## 2016-04-23 VITALS — BP 144/75 | HR 68 | Temp 98.0°F | Wt 129.1 lb

## 2016-04-23 DIAGNOSIS — E7800 Pure hypercholesterolemia, unspecified: Secondary | ICD-10-CM | POA: Insufficient documentation

## 2016-04-23 DIAGNOSIS — F329 Major depressive disorder, single episode, unspecified: Secondary | ICD-10-CM | POA: Diagnosis not present

## 2016-04-23 DIAGNOSIS — Z87891 Personal history of nicotine dependence: Secondary | ICD-10-CM | POA: Diagnosis not present

## 2016-04-23 DIAGNOSIS — M129 Arthropathy, unspecified: Secondary | ICD-10-CM | POA: Insufficient documentation

## 2016-04-23 DIAGNOSIS — C50211 Malignant neoplasm of upper-inner quadrant of right female breast: Secondary | ICD-10-CM | POA: Insufficient documentation

## 2016-04-23 DIAGNOSIS — Z8719 Personal history of other diseases of the digestive system: Secondary | ICD-10-CM | POA: Insufficient documentation

## 2016-04-23 DIAGNOSIS — J45909 Unspecified asthma, uncomplicated: Secondary | ICD-10-CM | POA: Diagnosis not present

## 2016-04-23 DIAGNOSIS — J42 Unspecified chronic bronchitis: Secondary | ICD-10-CM | POA: Insufficient documentation

## 2016-04-23 DIAGNOSIS — Z170 Estrogen receptor positive status [ER+]: Secondary | ICD-10-CM | POA: Diagnosis not present

## 2016-04-23 DIAGNOSIS — I10 Essential (primary) hypertension: Secondary | ICD-10-CM | POA: Insufficient documentation

## 2016-04-23 DIAGNOSIS — Z510 Encounter for antineoplastic radiation therapy: Secondary | ICD-10-CM | POA: Diagnosis not present

## 2016-04-23 DIAGNOSIS — Z803 Family history of malignant neoplasm of breast: Secondary | ICD-10-CM | POA: Insufficient documentation

## 2016-04-23 DIAGNOSIS — I998 Other disorder of circulatory system: Secondary | ICD-10-CM | POA: Insufficient documentation

## 2016-04-23 DIAGNOSIS — K219 Gastro-esophageal reflux disease without esophagitis: Secondary | ICD-10-CM | POA: Diagnosis not present

## 2016-04-23 NOTE — Progress Notes (Signed)
  Oncology Nurse Navigator Documentation  Navigator Location: [facility] Onc (04/23/16 1700)   )Navigator Encounter Type: Initial RadOnc (04/23/16 1700)                     Patient Visit Type: HKUVJD;Initial (04/23/16 1700) Treatment Phase: Pre-Tx/Tx Discussion (04/23/16 1700) Barriers/Navigation Needs: Education;Coordination of Care (04/23/16 1700)                Acuity: Level 2 (04/23/16 1700)         Time Spent with Patient: 45 (04/23/16 1700)  Met patient , son at initial Rad/Onc appointment.  Phoned Dr. Jamal Collin, and pathology to determine [facility] results. Test sent 2/19, and results not back.  Patient to return 05/07/16 to determine treatment plan based on these  Results. Per Dr. Angie Fava note, if Her2 negative, he may order mammoprint.

## 2016-04-23 NOTE — Consult Note (Signed)
NEW PATIENT EVALUATION  Name: Elizabeth Bailey  MRN: [id number]  Date:   04/23/2016     DOB: 1940-08-25   This 76 y.o. female patient presents to the clinic for initial evaluation of stage I invasive mammary carcinoma. Stage I (T1 CN 0 M0) ER/PR positive invasive mammary carcinoma HER-2/neu equivocal  REFERRING PHYSICIAN: Arlis Porta., MD  CHIEF COMPLAINT:  Chief Complaint  Patient presents with  . Breast Cancer    Initial evaluation    DIAGNOSIS: The encounter diagnosis was Malignant neoplasm of upper-inner quadrant of right breast in female, estrogen receptor positive ([place]).   PREVIOUS INVESTIGATIONS:  Pathology reports reviewed Mammograms and ultrasound reviewed Clinical notes reviewed  HPI: Patient is a 76 year old female whose husband was a former patient of mine years ago. She presented with an abnormal mammogram of the right breast showing suspicious microcalcifications in the upper inner quadrant suggestive of malignancy. She Ultrasound-guided biopsy which was positive for ER/PR positive invasive mammary carcinoma. HER-2/neu was equivocal. She has underwent wide local excision and sentinel node biopsy showing 4 sentinel nodes negative. Tumor measured 1.2 cm with margins clear at 2 mm. 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 Tumor was overall grade 2. Again HER-2/neu was equivocal her Elta Gu[place]oupe testing is being performed and pending. She is done well postoperatively. She seen today accompanied by her son. She specifically denies breast tenderness cough or bone pain. Tumor is very close to the nipple areolar complex making MammoSite balloon catheter placement contraindicated.  PLANNED TREATMENT REGIMEN: Whole breast radiation  PAST MEDICAL HISTORY:  has a past medical history of Arthritis; Asthma; Cancer ([place]) (03/27/2016); Chronic bronchitis ([place]); Collagen vascular disease ([place]); Depression; Diverticulosis; GERD (gastroesophageal reflux  disease); High cholesterol; Hypertension; and Seasonal allergies.    PAST SURGICAL HISTORY:  Past Surgical History:  Procedure Laterality Date  . ABDOMINAL HYSTERECTOMY    . BREAST BIOPSY  1970's  . BREAST BIOPSY Right 03/27/2016   INVASIVE MAMMARY CARCINOMA  . BREAST LUMPECTOMY WITH SENTINEL LYMPH NODE BIOPSY Right 04/09/2016   Procedure: BREAST LUMPECTOMY WITH SENTINEL LYMPH NODE BX;  Surgeon: Christene Lye, MD;  Location: [facility] ORS;  Service: General;  Laterality: Right;  . CAROTID ENDARTERECTOMY Left   . CHOLECYSTECTOMY    . COLONOSCOPY  2005  . DG  BONE DENSITY ([place] HX)  2010  . DIAGNOSTIC MAMMOGRAM  2015  . EUS N/A 03/12/2016   Procedure: FULL UPPER ENDOSCOPIC ULTRASOUND (EUS) RADIAL;  Surgeon: Holly Bodily, MD;  Location: [facility] ENDOSCOPY;  Service: Endoscopy;  Laterality: N/A;  . GALLBLADDER SURGERY    . PANCREAS SURGERY  01/24/2016   Dr Burt Knack  . pap smear  2014  . REPAIR OF PERFORATED ULCER N/A 01/24/2016   Procedure: Exploratory Laparotomy with biopsy of Pancreas;  Surgeon: Florene Glen, MD;  Location: [facility] ORS;  Service: General;  Laterality: N/A;  . UPPER GI ENDOSCOPY  03/12/2016   Dr Mont Dutton    FAMILY HISTORY: family history includes Breast cancer (age of onset: 54) in her sister; Depression in her mother and sister; Diabetes in her sister; Heart disease in her brother; Pneumonia in her mother; Stroke in her brother.  SOCIAL HISTORY:  reports that she quit smoking about 30 years ago. Her smoking use included Cigarettes. She has a 5.00 pack-year smoking history. She has never used smokeless tobacco. She reports that she does not drink alcohol or use drugs.  ALLERGIES: Cyclobenzaprine; Flucytosine; Fluticasone-salmeterol; and Naproxen  MEDICATIONS:  Current Outpatient Prescriptions  Medication Sig Dispense Refill  . acetaminophen (TYLENOL) 325 MG tablet Take 650 mg by mouth every 6 (six) hours as needed for mild pain.    . Adalimumab (HUMIRA PEN) 40  MG/0.8ML PNKT Inject 40 mg into the skin every 21 ( twenty-one) days. TAKES EVERY OTHER WEEK  LAST DOSE WAS IN 01-2016    . albuterol (PROVENTIL HFA;VENTOLIN HFA) 108 (90 Base) MCG/ACT inhaler Inhale 2 puffs into the lungs every 6 (six) hours as needed for wheezing or shortness of breath.    . Biotin w/ Vitamins C & E (HAIR/SKIN/NAILS PO) Take 1 tablet by mouth 2 (two) times daily.    . budesonide-formoterol (SYMBICORT) 160-4.5 MCG/ACT inhaler Inhale 1 puff into the lungs 2 (two) times daily. 1 Inhaler 12  . diphenhydramine-acetaminophen (TYLENOL PM) 25-500 MG TABS tablet Take 1-2 tablets by mouth at bedtime as needed (SLEEP). 1-2 TABLETS DEPENDING ON DIFFICULTLY SLEEPING    . esomeprazole (NEXIUM) 40 MG capsule Take 1 capsule (40 mg total) by mouth 2 (two) times daily before a meal. 60 capsule 6  . fluticasone (FLONASE) 50 MCG/ACT nasal spray Place 2 sprays into the nose daily as needed for allergies or rhinitis.     Marland Kitchen HYDROcodone-acetaminophen (NORCO/VICODIN) 5-325 MG tablet Take 1 tablet by mouth at bedtime as needed for moderate pain.     Marland Kitchen losartan (COZAAR) 50 MG tablet Take 1 tablet (50 mg total) by mouth daily. 90 tablet 3  . Melatonin 10 MG TABS Take 2 tablets by mouth at bedtime. Reported on 06/03/2015    . montelukast (SINGULAIR) 10 MG tablet TAKE 1 TABLET BY MOUTH ONCE DAILY. 30 tablet 12  . Omega 3 1000 MG CAPS Take 1,000 mg by mouth daily. omega-3 fatty acids-vitamin E (FISH OIL) 1,000 mg    . ondansetron (ZOFRAN) 4 MG tablet Take 1 tablet (4 mg total) by mouth every 6 (six) hours as needed for nausea. 20 tablet 0  . polyethylene glycol (MIRALAX / GLYCOLAX) packet Take 17 g by mouth daily. (Patient taking differently: Take 17 g by mouth daily as needed. ) 14 each 0  . simvastatin (ZOCOR) 20 MG tablet Take 1 tablet every other night. (Patient taking differently: Take 20 mg by mouth every other day. Take 1 tablet every night.) 30 tablet 12  . traMADol (ULTRAM) 50 MG tablet Take 1 tablet (50  mg total) by mouth every 6 (six) hours as needed. 90 tablet 3  . Amino Acids-Protein Hydrolys (FEEDING SUPPLEMENT, PRO-STAT SUGAR FREE 64,) LIQD Take 30 mLs by mouth 3 (three) times daily. (Patient not taking: Reported on 04/23/2016) 900 mL 0  . bisacodyl (DULCOLAX) 10 MG suppository Place 1 suppository (10 mg total) rectally daily as needed for moderate constipation. 12 suppository 0  . feeding supplement (BOOST / RESOURCE BREEZE) LIQD Take 1 Container by mouth 3 (three) times daily between meals. (Patient not taking: Reported on 04/23/2016)  0   No current facility-administered medications for this encounter.     ECOG PERFORMANCE STATUS:  0 - Asymptomatic  REVIEW OF SYSTEMS:  Patient denies any weight loss, fatigue, weakness, fever, chills or night sweats. Patient denies any loss of vision, blurred vision. Patient denies any ringing  of the ears or hearing loss. No irregular heartbeat. Patient denies heart murmur or history of fainting. Patient denies any chest pain or pain radiating to her upper extremities. Patient denies any shortness of breath, difficulty breathing at night, cough or hemoptysis. Patient denies any swelling in  the lower legs. Patient denies any nausea vomiting, vomiting of blood, or coffee ground material in the vomitus. Patient denies any stomach pain. Patient states has had normal bowel movements no significant constipation or diarrhea. Patient denies any dysuria, hematuria or significant nocturia. Patient denies any problems walking, swelling in the joints or loss of balance. Patient denies any skin changes, loss of hair or loss of weight. Patient denies any excessive worrying or anxiety or significant depression. Patient denies any problems with insomnia. Patient denies excessive thirst, polyuria, polydipsia. Patient denies any swollen glands, patient denies easy bruising or easy bleeding. Patient denies any recent infections, allergies or URI. Patient "s visual fields have not  changed significantly in recent time.    PHYSICAL EXAM: BP (!) 144/75   Pulse 68   Temp 98 F (36.7 C)   Wt 129 lb 1.3 oz (58.6 kg)   BMI 23.61 kg/m  Well-developed elderly female in NAD. Right breast is wide local excision and sentinel node biopsy sites which are healing well. The biopsy site is adjacent to the nipple areolar complex. No dominant mass or nodularity is noted in either breast in 2 positions examined. Breasts are rather large and pendulous. Well-developed well-nourished patient in NAD. HEENT reveals PERLA, EOMI, discs not visualized.  Oral cavity is clear. No oral mucosal lesions are identified. Neck is clear without evidence of cervical or supraclavicular adenopathy. Lungs are clear to A&P. Cardiac examination is essentially unremarkable with regular rate and rhythm without murmur rub or thrill. Abdomen is benign with no organomegaly or masses noted. Motor sensory and DTR levels are equal and symmetric in the upper and lower extremities. Cranial nerves II through XII are grossly intact. Proprioception is intact. No peripheral adenopathy or edema is identified. No motor or sensory levels are noted. Crude visual fields are within normal range.   LABORATORY DATA: Pathology reports reviewed    RADIOLOGY RESULTS: Mammograms and ultrasound reviewed   IMPRESSION: Stage I invasive mammary carcinoma the right breast in 76 year old female ER/PR positive HER-2/neu equivocal status post wide local excision and sentinel node biopsy.  PLAN: At this time we're awaiting the her Elta Guadeloupe results and if they are positive which show potential for treating with Herceptin and chemotherapy. If it is negative she will undergo a MammaPrint to determine suitability of chemotherapy. I am to wait another 2 weeks for these tests to be complete before seeing the patient again for treatment planning purposes. Otherwise I believe her breasts are large for hypofractionated course of treatment and would recommend  5040 cGy in 28 fractions. I also boost her scar another 1400 cGy using electron beam. Risks and benefits of treatment including skin reaction fatigue alteration of blood counts possible occlusion of superficial lung were all discussed in detail. Should she need chemotherapy we'll sequence radiation after completion of that treatment. Patient and son both compress my treatment plan well.  I would like to take this opportunity to thank you for allowing me to participate in the care of your patient.Armstead Peaks., MD

## 2016-04-27 ENCOUNTER — Encounter: Payer: Self-pay | Admitting: Family Medicine

## 2016-04-27 ENCOUNTER — Ambulatory Visit: Payer: Medicare Other | Admitting: Family Medicine

## 2016-04-27 ENCOUNTER — Ambulatory Visit (INDEPENDENT_AMBULATORY_CARE_PROVIDER_SITE_OTHER): Payer: Medicare Other | Admitting: Family Medicine

## 2016-04-27 VITALS — BP 135/80 | HR 66 | Temp 97.8°F | Resp 16 | Ht 62.0 in | Wt 128.0 lb

## 2016-04-27 DIAGNOSIS — E7849 Other hyperlipidemia: Secondary | ICD-10-CM

## 2016-04-27 DIAGNOSIS — E784 Other hyperlipidemia: Secondary | ICD-10-CM | POA: Diagnosis not present

## 2016-04-27 DIAGNOSIS — J431 Panlobular emphysema: Secondary | ICD-10-CM

## 2016-04-27 DIAGNOSIS — I10 Essential (primary) hypertension: Secondary | ICD-10-CM

## 2016-04-27 DIAGNOSIS — M069 Rheumatoid arthritis, unspecified: Secondary | ICD-10-CM | POA: Diagnosis not present

## 2016-04-27 NOTE — Progress Notes (Signed)
Name: Elizabeth Bailey   MRN: [id number]    DOB: 1940/07/11   Date:04/27/2016       Progress Note  Subjective  Chief Complaint  Chief Complaint  Patient presents with  . Hypertension    HPI Here for f/u of HBP.  She has had multiple medical problems including cancer.  Doing ok with all these at this time.  BP doing better on lower dose of meds.  No problem-specific Assessment & Plan notes found for this encounter.   Past Medical History:  Diagnosis Date  . Arthritis   . Asthma   . Cancer ([place]) 03/27/2016   Invasive mammary carcinoma right breast Stage 1 ER/PR positive  . Chronic bronchitis ([place])   . Collagen vascular disease ([place])   . Depression   . Diverticulosis   . GERD (gastroesophageal reflux disease)   . High cholesterol   . Hypertension   . Seasonal allergies     Past Surgical History:  Procedure Laterality Date  . ABDOMINAL HYSTERECTOMY    . BREAST BIOPSY  1970's  . BREAST BIOPSY Right 03/27/2016   INVASIVE MAMMARY CARCINOMA  . BREAST LUMPECTOMY WITH SENTINEL LYMPH NODE BIOPSY Right 04/09/2016   Procedure: BREAST LUMPECTOMY WITH SENTINEL LYMPH NODE BX;  Surgeon: Christene Lye, MD;  Location: [facility] ORS;  Service: General;  Laterality: Right;  . CAROTID ENDARTERECTOMY Left   . CHOLECYSTECTOMY    . COLONOSCOPY  2005  . DG  BONE DENSITY ([place] HX)  2010  . DIAGNOSTIC MAMMOGRAM  2015  . EUS N/A 03/12/2016   Procedure: FULL UPPER ENDOSCOPIC ULTRASOUND (EUS) RADIAL;  Surgeon: Holly Bodily, MD;  Location: [facility] END[facility];  Service: Endoscopy;  Laterality: N/A;  . GALLBLADDER SURGERY    . PANCREAS SURGERY  01/24/2016   Dr Burt Knack  . pap smear  2014  . REPAIR OF PERFORATED ULCER N/A 01/24/2016   Procedure: Exploratory Laparotomy with biopsy of Pancreas;  Surgeon: Florene Glen, MD;  Location: [facility] ORS;  Service: General;  Laterality: N/A;  . UPPER GI ENDOSCOPY  03/12/2016   Dr Mont Dutton    Family History  Problem Relation Age of Onset  . Pneumonia  Mother   . Depression Mother   . Depression Sister   . Breast cancer Sister 98  . Diabetes Sister   . Stroke Brother   . Heart disease Brother     Social History   Social History  . Marital status: Widowed    Spouse name: N/A  . Number of children: N/A  . Years of education: N/A   Occupational History  . Not on file.   Social History Main Topics  . Smoking status: Former Smoker    Packs/day: 0.50    Years: 10.00    Types: Cigarettes    Quit date: 1988  . Smokeless tobacco: Never Used     Comment: quit 1988  . Alcohol use No     Comment: wine twice a month   . Drug use: No  . Sexual activity: No   Other Topics Concern  . Not on file   Social History Narrative  . No narrative on file     Current Outpatient Prescriptions:  .  acetaminophen (TYLENOL) 325 MG tablet, Take 650 mg by mouth every 6 (six) hours as needed for mild pain., Disp: , Rfl:  .  Adalimumab (HUMIRA PEN) 40 MG/0.8ML PNKT, Inject 40 mg into the skin every 21 ( twenty-one) days. TAKES EVERY OTHER WEEK  LAST DOSE WAS  IN 01-2016, Disp: , Rfl:  .  albuterol (PROVENTIL HFA;VENTOLIN HFA) 108 (90 Base) MCG/ACT inhaler, Inhale 2 puffs into the lungs every 6 (six) hours as needed for wheezing or shortness of breath., Disp: , Rfl:  .  bisacodyl (DULCOLAX) 10 MG suppository, Place 1 suppository (10 mg total) rectally daily as needed for moderate constipation., Disp: 12 suppository, Rfl: 0 .  budesonide-formoterol (SYMBICORT) 160-4.5 MCG/ACT inhaler, Inhale 1 puff into the lungs 2 (two) times daily., Disp: 1 Inhaler, Rfl: 12 .  diphenhydramine-acetaminophen (TYLENOL PM) 25-500 MG TABS tablet, Take 1-2 tablets by mouth at bedtime as needed (SLEEP). 1-2 TABLETS DEPENDING ON DIFFICULTLY SLEEPING, Disp: , Rfl:  .  esomeprazole (NEXIUM) 40 MG capsule, Take 1 capsule (40 mg total) by mouth 2 (two) times daily before a meal., Disp: 60 capsule, Rfl: 6 .  feeding supplement (BOOST / RESOURCE BREEZE) LIQD, Take 1 Container by  mouth 3 (three) times daily between meals., Disp: , Rfl: 0 .  fluticasone (FLONASE) 50 MCG/ACT nasal spray, Place 2 sprays into the nose daily as needed for allergies or rhinitis. , Disp: , Rfl:  .  HYDROcodone-acetaminophen (NORCO/VICODIN) 5-325 MG tablet, Take 1 tablet by mouth at bedtime as needed for moderate pain. , Disp: , Rfl:  .  losartan (COZAAR) 50 MG tablet, Take 1 tablet (50 mg total) by mouth daily., Disp: 90 tablet, Rfl: 3 .  Melatonin 10 MG TABS, Take 2 tablets by mouth at bedtime. Reported on 06/03/2015, Disp: , Rfl:  .  montelukast (SINGULAIR) 10 MG tablet, TAKE 1 TABLET BY MOUTH ONCE DAILY., Disp: 30 tablet, Rfl: 12 .  Omega 3 1000 MG CAPS, Take 1,000 mg by mouth daily. omega-3 fatty acids-vitamin E (FISH OIL) 1,000 mg, Disp: , Rfl:  .  ondansetron (ZOFRAN) 4 MG tablet, Take 1 tablet (4 mg total) by mouth every 6 (six) hours as needed for nausea., Disp: 20 tablet, Rfl: 0 .  simvastatin (ZOCOR) 20 MG tablet, Take 1 tablet every other night. (Patient taking differently: Take 20 mg by mouth every other day. Take 1 tablet every night.), Disp: 30 tablet, Rfl: 12 .  traMADol (ULTRAM) 50 MG tablet, Take 1 tablet (50 mg total) by mouth every 6 (six) hours as needed., Disp: 90 tablet, Rfl: 3  Allergies  Allergen Reactions  . Cyclobenzaprine     Other reaction(s): Other (See Comments) Frequent urination  . Flucytosine Other (See Comments)    Sore mouth  . Fluticasone-Salmeterol Other (See Comments)    Other reaction(s): Other (See Comments) Sore mouth  . Naproxen Nausea Only    GI upset     Review of Systems  Constitutional: Negative for chills, fever, malaise/fatigue and weight loss.  HENT: Negative for hearing loss and tinnitus.   Eyes: Negative for blurred vision and double vision.  Respiratory: Negative for cough, shortness of breath and wheezing.   Cardiovascular: Negative for chest pain, palpitations and leg swelling.  Gastrointestinal: Negative for abdominal pain, blood  in stool and heartburn.  Genitourinary: Negative for dysuria, frequency and urgency.  Musculoskeletal: Negative for joint pain and myalgias.  Skin: Negative for rash.  Neurological: Negative for dizziness, tingling, tremors, weakness and headaches.      Objective  Vitals:   04/27/16 0914 04/27/16 1005  BP: 130/65 135/80  Pulse: 66   Resp: 16   Temp: 97.8 F (36.6 C)   TempSrc: Oral   Weight: 128 lb (58.1 kg)   Height: 5\' 2"  (1.575 m)     Physical  Exam  Constitutional: She is oriented to person, place, and time and well-developed, well-nourished, and in no distress. No distress.  HENT:  Head: Normocephalic and atraumatic.  Eyes: Conjunctivae and EOM are normal. Pupils are equal, round, and reactive to light. No scleral icterus.  Neck: Normal range of motion. Neck supple. Carotid bruit is not present. No thyromegaly present.  Cardiovascular: Normal rate, regular rhythm and normal heart sounds.  Exam reveals no gallop and no friction rub.   No murmur heard. Pulmonary/Chest: Effort normal and breath sounds normal. No respiratory distress. She has no wheezes. She has no rales.  Abdominal: Soft. Bowel sounds are normal. She exhibits no distension and no mass. There is no tenderness.  Musculoskeletal: She exhibits no edema.  Lymphadenopathy:    She has no cervical adenopathy.  Neurological: She is alert and oriented to person, place, and time.  Vitals reviewed.         Assessment & Plan  Problem List Items Addressed This Visit      Cardiovascular and Mediastinum   Hypertension - Primary     Respiratory   COPD (chronic obstructive pulmonary disease) ([place])     Musculoskeletal and Integument   Rheumatoid arthritis ([place])     Other   Hyperlipidemia      No orders of the defined types were placed in this encounter.  1. 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 Rheumatoid arthritis involving multiple sites, unspecified  rheumatoid factor presence ([place])   4. Other hyperlipidemia Cont med.

## 2016-05-01 ENCOUNTER — Encounter: Payer: Self-pay | Admitting: General Surgery

## 2016-05-01 LAB — SURGICAL PATHOLOGY

## 2016-05-04 ENCOUNTER — Telehealth: Payer: Self-pay

## 2016-05-04 NOTE — Telephone Encounter (Signed)
-----   Message from Gerhard Munch sent at 05/04/2016  1:43 PM EST ----- Regarding: time for procedure tomorrow Contact: [phone] Left message on VM wants to know what time her procedure is for tomorrow.  [place] patient

## 2016-05-04 NOTE — Telephone Encounter (Signed)
LVM answering patient's question. Procedure starts at 915. Arrival by 845am.

## 2016-05-05 ENCOUNTER — Telehealth: Payer: Self-pay | Admitting: *Deleted

## 2016-05-05 ENCOUNTER — Encounter
Admission: RE | Disposition: A | Discharge status: Home / Self Care | Payer: Self-pay | Source: Ambulatory Visit | Attending: Gastroenterology

## 2016-05-05 ENCOUNTER — Ambulatory Visit
Admission: RE | Admit: 2016-05-05 | Discharge: 2016-05-05 | Disposition: A | Discharge status: Home / Self Care | Payer: Medicare Other | Source: Ambulatory Visit | Attending: Gastroenterology | Admitting: Gastroenterology

## 2016-05-05 ENCOUNTER — Ambulatory Visit: Payer: Medicare Other | Admitting: Registered Nurse

## 2016-05-05 ENCOUNTER — Encounter: Payer: Self-pay | Admitting: *Deleted

## 2016-05-05 DIAGNOSIS — K449 Diaphragmatic hernia without obstruction or gangrene: Secondary | ICD-10-CM | POA: Diagnosis not present

## 2016-05-05 DIAGNOSIS — Z09 Encounter for follow-up examination after completed treatment for conditions other than malignant neoplasm: Secondary | ICD-10-CM | POA: Insufficient documentation

## 2016-05-05 DIAGNOSIS — Z79899 Other long term (current) drug therapy: Secondary | ICD-10-CM | POA: Insufficient documentation

## 2016-05-05 DIAGNOSIS — I10 Essential (primary) hypertension: Secondary | ICD-10-CM | POA: Insufficient documentation

## 2016-05-05 DIAGNOSIS — Z7951 Long term (current) use of inhaled steroids: Secondary | ICD-10-CM | POA: Diagnosis not present

## 2016-05-05 DIAGNOSIS — Z87891 Personal history of nicotine dependence: Secondary | ICD-10-CM | POA: Diagnosis not present

## 2016-05-05 DIAGNOSIS — J449 Chronic obstructive pulmonary disease, unspecified: Secondary | ICD-10-CM | POA: Insufficient documentation

## 2016-05-05 DIAGNOSIS — K263 Acute duodenal ulcer without hemorrhage or perforation: Secondary | ICD-10-CM

## 2016-05-05 DIAGNOSIS — Z8711 Personal history of peptic ulcer disease: Secondary | ICD-10-CM | POA: Insufficient documentation

## 2016-05-05 DIAGNOSIS — K219 Gastro-esophageal reflux disease without esophagitis: Secondary | ICD-10-CM | POA: Diagnosis not present

## 2016-05-05 DIAGNOSIS — K2970 Gastritis, unspecified, without bleeding: Secondary | ICD-10-CM | POA: Diagnosis not present

## 2016-05-05 DIAGNOSIS — K2950 Unspecified chronic gastritis without bleeding: Secondary | ICD-10-CM | POA: Insufficient documentation

## 2016-05-05 DIAGNOSIS — E7800 Pure hypercholesterolemia, unspecified: Secondary | ICD-10-CM | POA: Insufficient documentation

## 2016-05-05 HISTORY — PX: ESOPHAGOGASTRODUODENOSCOPY (EGD) WITH PROPOFOL: SHX5813

## 2016-05-05 SURGERY — ESOPHAGOGASTRODUODENOSCOPY (EGD) WITH PROPOFOL
Anesthesia: General

## 2016-05-05 MED ORDER — PROPOFOL 500 MG/50ML IV EMUL
INTRAVENOUS | Status: AC
  Filled 2016-05-05: qty 50

## 2016-05-05 MED ORDER — MIDAZOLAM HCL 2 MG/2ML IJ SOLN
INTRAMUSCULAR | Status: AC
  Filled 2016-05-05: qty 2

## 2016-05-05 MED ORDER — FENTANYL CITRATE (PF) 100 MCG/2ML IJ SOLN
INTRAMUSCULAR | Status: AC
  Filled 2016-05-05: qty 2

## 2016-05-05 MED ORDER — PROPOFOL 500 MG/50ML IV EMUL
INTRAVENOUS | Status: DC | PRN
Start: 2016-05-05 — End: 2016-05-05
  Administered 2016-05-05: 150 ug/kg/min via INTRAVENOUS

## 2016-05-05 MED ORDER — PROPOFOL 10 MG/ML IV BOLUS
INTRAVENOUS | Status: DC | PRN
  Administered 2016-05-05: 50 mg via INTRAVENOUS

## 2016-05-05 MED ORDER — SODIUM CHLORIDE 0.9 % IV SOLN
INTRAVENOUS | Status: DC
  Administered 2016-05-05: 09:00:00 via INTRAVENOUS

## 2016-05-05 MED ORDER — LIDOCAINE HCL (PF) 2 % IJ SOLN
INTRAMUSCULAR | Status: AC
  Filled 2016-05-05: qty 2

## 2016-05-05 MED ORDER — GLYCOPYRROLATE 0.2 MG/ML IJ SOLN
INTRAMUSCULAR | Status: AC
  Filled 2016-05-05: qty 1

## 2016-05-05 MED ORDER — MIDAZOLAM HCL 2 MG/2ML IJ SOLN
INTRAMUSCULAR | Status: DC | PRN
  Administered 2016-05-05: 1 mg via INTRAVENOUS

## 2016-05-05 MED ORDER — FENTANYL CITRATE (PF) 100 MCG/2ML IJ SOLN
INTRAMUSCULAR | Status: DC | PRN
  Administered 2016-05-05 (×2): 25 ug via INTRAVENOUS

## 2016-05-05 MED ORDER — LIDOCAINE HCL (PF) 2 % IJ SOLN
INTRAMUSCULAR | Status: DC | PRN
  Administered 2016-05-05: 40 mg via INTRADERMAL

## 2016-05-05 NOTE — H&P (Signed)
Elizabeth Lame, MD [address] [place], [place] [address] Phone: [phone] Fax : [phone]  Primary Care Physician:  Dicky Doe, MD Primary Gastroenterologist:  Dr. Allen Norris  Pre-Procedure History & Physical: HPI:  Elizabeth Bailey is a 76 y.o. female is here for an endoscopy.   Past Medical History:  Diagnosis Date  . Arthritis   . Asthma   . 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 Chronic bronchitis ([place])   . Collagen vascular disease ([place])   . Depression   . Diverticulosis   . GERD (gastroesophageal reflux disease)   . High cholesterol   . Hypertension   . Seasonal allergies     Past Surgical History:  Procedure Laterality Date  . ABDOMINAL HYSTERECTOMY    . BREAST BIOPSY  1970's  . BREAST BIOPSY Right 03/27/2016   INVASIVE MAMMARY CARCINOMA  . BREAST LUMPECTOMY WITH SENTINEL LYMPH NODE BIOPSY Right 04/09/2016   Procedure: BREAST LUMPECTOMY WITH SENTINEL LYMPH NODE BX;  Surgeon: Christene Lye, MD;  Location: [facility] ORS;  Service: General;  Laterality: Right;  . CAROTID ENDARTERECTOMY Left   . CHOLECYSTECTOMY    . COLONOSCOPY  2005  . DG  BONE DENSITY ([place] HX)  2010  . DIAGNOSTIC MAMMOGRAM  2015  . EUS N/A 03/12/2016   Procedure: FULL UPPER ENDOSCOPIC ULTRASOUND (EUS) RADIAL;  Surgeon: Holly Bodily, MD;  Location: [facility] ENDOSCOPY;  Service: Endoscopy;  Laterality: N/A;  . GALLBLADDER SURGERY    . PANCREAS SURGERY  01/24/2016   Dr Burt Knack  . pap smear  2014  . REPAIR OF PERFORATED ULCER N/A 01/24/2016   Procedure: Exploratory Laparotomy with biopsy of Pancreas;  Surgeon: Florene Glen, MD;  Location: [facility] ORS;  Service: General;  Laterality: N/A;  . UPPER GI ENDOSCOPY  03/12/2016   Dr Mont Dutton    Prior to Admission medications   Medication Sig Start Date End Date Taking? Authorizing Provider  budesonide-formoterol (SYMBICORT) 160-4.5 MCG/ACT inhaler Inhale 1 puff into the lungs  2 (two) times daily. 12/05/15  Yes Arlis Porta., MD  diphenhydramine-acetaminophen (TYLENOL PM) 25-500 MG TABS tablet Take 1-2 tablets by mouth at bedtime as needed (SLEEP). 1-2 TABLETS DEPENDING ON DIFFICULTLY SLEEPING   Yes Historical Provider, MD  esomeprazole (NEXIUM) 40 MG capsule Take 1 capsule (40 mg total) by mouth 2 (two) times daily before a meal. 03/17/16  Yes Arlis Porta., MD  feeding supplement (BOOST / RESOURCE BREEZE) LIQD Take 1 Container by mouth 3 (three) times daily between meals. 01/31/16  Yes Olean Ree, MD  losartan (COZAAR) 50 MG tablet Take 1 tablet (50 mg total) by mouth daily. 04/06/16  Yes Arlis Porta., MD  Melatonin 10 MG TABS Take 2 tablets by mouth at bedtime. Reported on 06/03/2015   Yes Historical Provider, MD  montelukast (SINGULAIR) 10 MG tablet TAKE 1 TABLET BY MOUTH ONCE DAILY. 11/29/15  Yes Arlis Porta., MD  Omega 3 1000 MG CAPS Take 1,000 mg by mouth daily. omega-3 fatty acids-vitamin E (FISH OIL) 1,000 mg   Yes Historical Provider, MD  traMADol (ULTRAM) 50 MG tablet Take 1 tablet (50 mg total) by mouth every 6 (six) hours as needed. 04/06/16  Yes Arlis Porta., MD  acetaminophen (TYLENOL) 325 MG tablet Take 650 mg by mouth every 6 (six) hours as needed for mild pain.    Historical Provider, MD  Adalimumab (HUMIRA PEN) 40  MG/0.8ML PNKT Inject 40 mg into the skin every 21 ( twenty-one) days. TAKES EVERY OTHER WEEK  LAST DOSE WAS IN 01-2016 12/06/14   Historical Provider, MD  albuterol (PROVENTIL HFA;VENTOLIN HFA) 108 (90 Base) MCG/ACT inhaler Inhale 2 puffs into the lungs every 6 (six) hours as needed for wheezing or shortness of breath.    Historical Provider, MD  bisacodyl (DULCOLAX) 10 MG suppository Place 1 suppository (10 mg total) rectally daily as needed for moderate constipation. 01/31/16   Olean Ree, MD  fluticasone (FLONASE) 50 MCG/ACT nasal spray Place 2 sprays into the nose daily as needed for allergies or rhinitis.      Historical Provider, MD  HYDROcodone-acetaminophen (NORCO/VICODIN) 5-325 MG tablet Take 1 tablet by mouth at bedtime as needed for moderate pain.  11/28/15   Historical Provider, MD  ondansetron (ZOFRAN) 4 MG tablet Take 1 tablet (4 mg total) by mouth every 6 (six) hours as needed for nausea. 02/29/16   Florene Glen, MD  simvastatin (ZOCOR) 20 MG tablet Take 1 tablet every other night. Patient taking differently: Take 20 mg by mouth every other day. Take 1 tablet every night. 01/09/15   Arlis Porta., MD    Allergies as of 03/20/2016 - Review Complete 03/17/2016  Allergen Reaction Noted  . Cyclobenzaprine  03/28/2015  . Flucytosine Other (See Comments) 11/28/2014  . Fluticasone-salmeterol Other (See Comments) 03/28/2015  . Naproxen Nausea Only 11/28/2014  . Other Other (See Comments) 11/28/2014    Family History  Problem Relation Age of Onset  . Pneumonia Mother   . Depression Mother   . Depression Sister   . Breast cancer Sister 25  . Diabetes Sister   . Stroke Brother   . Heart disease Brother     Social History   Social History  . Marital status: Widowed    Spouse name: N/A  . Number of children: N/A  . Years of education: N/A   Occupational History  . Not on file.   Social History Main Topics  . Smoking status: Former Smoker    Packs/day: 0.50    Years: 10.00    Types: Cigarettes    Quit date: 1988  . Smokeless tobacco: Never Used     Comment: quit 1988  . Alcohol use No     Comment: wine twice a month   . Drug use: No  . Sexual activity: No   Other Topics Concern  . Not on file   Social History Narrative  . No narrative on file    Review of Systems: See HPI, otherwise negative ROS  Physical Exam: BP (!) 151/82   Pulse 92   Temp (!) 96.7 F (35.9 C) (Tympanic)   Resp 18   Ht 5\' 2"  (1.575 m)   Wt 128 lb (58.1 kg)   SpO2 97%   BMI 23.41 kg/m  General:   Alert,  pleasant and cooperative in NAD Head:  Normocephalic and  atraumatic. Neck:  Supple; no masses or thyromegaly. Lungs:  Clear throughout to auscultation.    Heart:  Regular rate and rhythm. Abdomen:  Soft, nontender and nondistended. Normal bowel sounds, without guarding, and without rebound.   Neurologic:  Alert and  oriented x4;  grossly normal neurologically.  Impression/Plan: Elizabeth Bailey is here for an endoscopy to be performed for follow up of peptic ulcer  Risks, benefits, limitations, and alternatives regarding  endoscopy have been reviewed with the patient.  Questions have been answered.  All parties agreeable.  Elizabeth Lame, MD  05/05/2016, 9:15 AM

## 2016-05-05 NOTE — Anesthesia Post-op Follow-up Note (Cosign Needed)
Anesthesia QCDR form completed.        

## 2016-05-05 NOTE — Anesthesia Postprocedure Evaluation (Signed)
Anesthesia Post Note  Patient: Elizabeth Bailey  Procedure(s) Performed: Procedure(s) (LRB): ESOPHAGOGASTRODUODENOSCOPY (EGD) WITH PROPOFOL (N/A)  Patient location during evaluation: Endoscopy Anesthesia Type: General Level of consciousness: awake and alert and oriented Pain management: pain level controlled Vital Signs Assessment: post-procedure vital signs reviewed and stable Respiratory status: spontaneous breathing, nonlabored ventilation and respiratory function stable Cardiovascular status: blood pressure returned to baseline and stable Postop Assessment: no signs of nausea or vomiting Anesthetic complications: no     Last Vitals:  Vitals:   05/05/16 0938 05/05/16 0948  BP: 110/68 137/83  Pulse: (!) 109 99  Resp: 19 15  Temp: 36.6 C     Last Pain:  Vitals:   05/05/16 0938  TempSrc: Oral                 Khamil Lamica

## 2016-05-05 NOTE — Telephone Encounter (Signed)
-----   Message from Dominga Ferry, [place] sent at 05/04/2016  8:21 AM EST ----- Rosann Auerbach, Can you please take care of ordering this on Tuesday? Thanks ----- Message ----- From: Christene Lye, MD Sent: 05/04/2016   8:12 AM To: Dominga Ferry, CMA  Pleqase order Mammoprint on this pt. Thanks

## 2016-05-05 NOTE — Transfer of Care (Signed)
Immediate Anesthesia Transfer of Care Note  Patient: Elizabeth Bailey  Procedure(s) Performed: Procedure(s): ESOPHAGOGASTRODUODENOSCOPY (EGD) WITH PROPOFOL (N/A)  Patient Location: PACU  Anesthesia Type:General  Level of Consciousness: awake, alert  and oriented  Airway & Oxygen Therapy: Patient Spontanous Breathing and Patient connected to nasal cannula oxygen  Post-op Assessment: Report given to RN and Post -op Vital signs reviewed and stable  Post vital signs: Reviewed and stable  Last Vitals:  Vitals:   05/05/16 0849 05/05/16 0936  BP: (!) 151/82 110/68  Pulse: 92 (!) 109  Resp: 18 14  Temp: (!) 35.9 C 36.2 C    Last Pain:  Vitals:   05/05/16 0936  TempSrc: Oral         Complications: No apparent anesthesia complications

## 2016-05-05 NOTE — Anesthesia Procedure Notes (Signed)
Date/Time: 05/05/2016 9:18 AM Performed by: Hedda Slade Pre-anesthesia Checklist: Patient identified, Emergency Drugs available, Suction available and Patient being monitored Oxygen Delivery Method: Nasal cannula

## 2016-05-05 NOTE — Telephone Encounter (Signed)
Order faxed to [facility] for mammoprint testing

## 2016-05-05 NOTE — Anesthesia Preprocedure Evaluation (Signed)
Anesthesia Evaluation  Patient identified by MRN, date of birth, ID band Patient awake    Reviewed: Allergy & Precautions, NPO status , Patient's Chart, lab work & pertinent test results  History of Anesthesia Complications Negative for: history of anesthetic complications  Airway Mallampati: II  TM Distance: >3 FB Neck ROM: Full    Dental no notable dental hx.    Pulmonary asthma , neg sleep apnea, COPD,  COPD inhaler, former smoker,    breath sounds clear to auscultation- rhonchi (-) wheezing      Cardiovascular hypertension, Pt. on medications (-) CAD and (-) Past MI  Rhythm:Regular Rate:Normal - Systolic murmurs and - Diastolic murmurs    Neuro/Psych PSYCHIATRIC DISORDERS Depression negative neurological ROS     GI/Hepatic Neg liver ROS, PUD, GERD  ,  Endo/Other  negative endocrine ROSneg diabetes  Renal/GU negative Renal ROS     Musculoskeletal  (+) Arthritis ,   Abdominal (+) - obese,   Peds  Hematology negative hematology ROS (+)   Anesthesia Other Findings Past Medical History: No date: Arthritis No date: Asthma 03/27/2016: Cancer ([place])     Comment: Invasive mammary carcinoma right breast Stage               1 ER/PR positive No date: Chronic bronchitis ([facility]) No date: Collagen vascular disease ([place]) No date: Depression No date: Diverticulosis No date: GERD (gastroesophageal reflux disease) No date: High cholesterol No date: Hypertension No date: Seasonal allergies   Reproductive/Obstetrics                             Anesthesia Physical Anesthesia Plan  ASA: III  Anesthesia Plan: General   Post-op Pain Management:    Induction: Intravenous  Airway Management Planned: Natural Airway  Additional Equipment:   Intra-op Plan:   Post-operative Plan:   Informed Consent: I have reviewed the patients History and Physical, chart, labs and discussed the procedure  including the risks, benefits and alternatives for the proposed anesthesia with the patient or authorized representative who has indicated his/her understanding and acceptance.   Dental advisory given  Plan Discussed with: CRNA and Anesthesiologist  Anesthesia Plan Comments:         Anesthesia Quick Evaluation

## 2016-05-05 NOTE — Op Note (Signed)
[facility] Gastroenterology Patient Name: Elizabeth Bailey Procedure Date: 05/05/2016 9:06 AM MRN: [id number] Account #: [id number] Date of Birth: 07-23-1940 Admit Type: Outpatient Age: 76 Room: [facility] ENDO ROOM 4 Gender: Female Note Status: Finalized Procedure:            Upper GI endoscopy Indications:          Follow-up of acute duodenal ulcer Providers:            Lucilla Lame MD, MD Referring MD:         Arlis Porta, MD (Referring MD) Medicines:            Propofol per Anesthesia Complications:        No immediate complications. Procedure:            Pre-Anesthesia Assessment:                       - Prior to the procedure, a History and Physical was                        performed, and patient medications and allergies were                        reviewed. The patient's tolerance of previous                        anesthesia was also reviewed. The risks and benefits of                        the procedure and the sedation options and risks were                        discussed with the patient. All questions were                        answered, and informed consent was obtained. Prior                        Anticoagulants: The patient has taken no previous                        anticoagulant or antiplatelet agents. ASA Grade                        Assessment: II - A patient with mild systemic disease.                        After reviewing the risks and benefits, the patient was                        deemed in satisfactory condition to undergo the                        procedure.                       After obtaining informed consent, the endoscope was                        passed under direct vision. Throughout the procedure,  the patient's blood pressure, pulse, and oxygen                        saturations were monitored continuously. The Endoscope                        was introduced through the mouth, and advanced to the                       second part of duodenum. The upper GI endoscopy was                        accomplished without difficulty. The patient tolerated                        the procedure well. Findings:      A small hiatal hernia was present.      Localized mild inflammation characterized by erythema was found in the       gastric antrum. Biopsies were taken with a cold forceps for histology.      No gross lesions were noted in the entire examined duodenum. Impression:           - Small hiatal hernia.                       - Gastritis. Biopsied.                       - No gross lesions in the entire examined duodenum. Recommendation:       - Await pathology results.                       - Discharge patient to home.                       - Resume previous diet.                       - Continue present medications. Procedure Code(s):    --- Professional ---                       [phone], Esophagogastroduodenoscopy, flexible, transoral;                        with biopsy, single or multiple Diagnosis Code(s):    --- Professional ---                       K29.70, Gastritis, unspecified, without bleeding                       K26.3, Acute duodenal ulcer without hemorrhage or                        perforation CPT copyright 2016 American Medical Association. All rights reserved. The codes documented in this report are preliminary and upon coder review may  be revised to meet current compliance requirements. Lucilla Lame MD, MD 05/05/2016 9:32:51 AM This report has been signed electronically. Number of Addenda: 0 Note Initiated On: 05/05/2016 9:06 AM      [facility]

## 2016-05-06 ENCOUNTER — Encounter: Payer: Self-pay | Admitting: Gastroenterology

## 2016-05-06 NOTE — Progress Notes (Signed)
  Oncology Nurse Navigator Documentation  Navigator Location: [facility] (05/06/16 0900)   )Navigator Encounter Type: Clinic/MDC;Follow-up Appt (05/06/16 0900)                     Patient Visit Type: RadOnc (05/06/16 0900)                              Time Spent with Patient: 15 (05/06/16 0900)   HerMARK Result negative.  Tissue sent for Mammoprint. Dr. Baruch Gouty aware, and patient's simulation changed to 05/13/16 at 10:00.

## 2016-05-07 ENCOUNTER — Encounter: Payer: Self-pay | Admitting: Gastroenterology

## 2016-05-07 ENCOUNTER — Ambulatory Visit: Payer: Medicare Other | Admitting: Radiation Oncology

## 2016-05-07 LAB — SURGICAL PATHOLOGY

## 2016-05-13 ENCOUNTER — Ambulatory Visit
Admission: RE | Admit: 2016-05-13 | Discharge: 2016-05-13 | Disposition: A | Discharge status: Home / Self Care | Payer: Medicare Other | Source: Ambulatory Visit | Attending: Radiation Oncology | Admitting: Radiation Oncology

## 2016-05-13 DIAGNOSIS — C50211 Malignant neoplasm of upper-inner quadrant of right female breast: Secondary | ICD-10-CM | POA: Diagnosis not present

## 2016-05-14 ENCOUNTER — Encounter: Payer: Self-pay | Admitting: General Surgery

## 2016-05-15 ENCOUNTER — Other Ambulatory Visit: Payer: Self-pay | Admitting: *Deleted

## 2016-05-15 DIAGNOSIS — C50211 Malignant neoplasm of upper-inner quadrant of right female breast: Secondary | ICD-10-CM

## 2016-05-18 ENCOUNTER — Encounter: Payer: Self-pay | Admitting: General Surgery

## 2016-05-20 DIAGNOSIS — C50211 Malignant neoplasm of upper-inner quadrant of right female breast: Secondary | ICD-10-CM | POA: Diagnosis not present

## 2016-05-21 ENCOUNTER — Ambulatory Visit
Admission: RE | Admit: 2016-05-21 | Discharge: 2016-05-21 | Disposition: A | Discharge status: Home / Self Care | Payer: Medicare Other | Source: Ambulatory Visit | Attending: Radiation Oncology | Admitting: Radiation Oncology

## 2016-05-21 DIAGNOSIS — C50211 Malignant neoplasm of upper-inner quadrant of right female breast: Secondary | ICD-10-CM | POA: Diagnosis not present

## 2016-05-25 ENCOUNTER — Ambulatory Visit
Admission: RE | Admit: 2016-05-25 | Discharge: 2016-05-25 | Disposition: A | Discharge status: Home / Self Care | Payer: Medicare Other | Source: Ambulatory Visit | Attending: Radiation Oncology | Admitting: Radiation Oncology

## 2016-05-25 ENCOUNTER — Ambulatory Visit: Payer: Medicare Other

## 2016-05-25 DIAGNOSIS — C50211 Malignant neoplasm of upper-inner quadrant of right female breast: Secondary | ICD-10-CM | POA: Diagnosis not present

## 2016-05-26 ENCOUNTER — Ambulatory Visit
Admission: RE | Admit: 2016-05-26 | Discharge: 2016-05-26 | Disposition: A | Discharge status: Home / Self Care | Payer: Medicare Other | Source: Ambulatory Visit | Attending: Radiation Oncology | Admitting: Radiation Oncology

## 2016-05-26 ENCOUNTER — Encounter: Payer: Self-pay | Admitting: General Surgery

## 2016-05-26 DIAGNOSIS — C50211 Malignant neoplasm of upper-inner quadrant of right female breast: Secondary | ICD-10-CM | POA: Diagnosis not present

## 2016-05-27 ENCOUNTER — Ambulatory Visit
Admission: RE | Admit: 2016-05-27 | Discharge: 2016-05-27 | Disposition: A | Discharge status: Home / Self Care | Payer: Medicare Other | Source: Ambulatory Visit | Attending: Radiation Oncology | Admitting: Radiation Oncology

## 2016-05-27 DIAGNOSIS — C50211 Malignant neoplasm of upper-inner quadrant of right female breast: Secondary | ICD-10-CM | POA: Diagnosis not present

## 2016-05-28 ENCOUNTER — Ambulatory Visit
Admission: RE | Admit: 2016-05-28 | Discharge: 2016-05-28 | Disposition: A | Discharge status: Home / Self Care | Payer: Medicare Other | Source: Ambulatory Visit | Attending: Radiation Oncology | Admitting: Radiation Oncology

## 2016-05-28 DIAGNOSIS — C50211 Malignant neoplasm of upper-inner quadrant of right female breast: Secondary | ICD-10-CM | POA: Diagnosis not present

## 2016-05-29 ENCOUNTER — Ambulatory Visit
Admission: RE | Admit: 2016-05-29 | Discharge: 2016-05-29 | Disposition: A | Discharge status: Home / Self Care | Payer: Medicare Other | Source: Ambulatory Visit | Attending: Radiation Oncology | Admitting: Radiation Oncology

## 2016-05-29 DIAGNOSIS — C50211 Malignant neoplasm of upper-inner quadrant of right female breast: Secondary | ICD-10-CM | POA: Diagnosis not present

## 2016-06-01 ENCOUNTER — Ambulatory Visit: Payer: Medicare Other

## 2016-06-02 ENCOUNTER — Ambulatory Visit
Admission: RE | Admit: 2016-06-02 | Discharge: 2016-06-02 | Disposition: A | Discharge status: Home / Self Care | Payer: Medicare Other | Source: Ambulatory Visit | Attending: Radiation Oncology | Admitting: Radiation Oncology

## 2016-06-02 DIAGNOSIS — C50211 Malignant neoplasm of upper-inner quadrant of right female breast: Secondary | ICD-10-CM | POA: Diagnosis not present

## 2016-06-03 ENCOUNTER — Ambulatory Visit
Admission: RE | Admit: 2016-06-03 | Discharge: 2016-06-03 | Disposition: A | Discharge status: Home / Self Care | Payer: Medicare Other | Source: Ambulatory Visit | Attending: Radiation Oncology | Admitting: Radiation Oncology

## 2016-06-03 DIAGNOSIS — C50211 Malignant neoplasm of upper-inner quadrant of right female breast: Secondary | ICD-10-CM | POA: Diagnosis not present

## 2016-06-04 ENCOUNTER — Inpatient Hospital Stay: Payer: Medicare Other | Attending: Radiation Oncology

## 2016-06-04 ENCOUNTER — Ambulatory Visit
Admission: RE | Admit: 2016-06-04 | Discharge: 2016-06-04 | Disposition: A | Discharge status: Home / Self Care | Payer: Medicare Other | Source: Ambulatory Visit | Attending: Radiation Oncology | Admitting: Radiation Oncology

## 2016-06-04 DIAGNOSIS — Z170 Estrogen receptor positive status [ER+]: Secondary | ICD-10-CM | POA: Insufficient documentation

## 2016-06-04 DIAGNOSIS — C50211 Malignant neoplasm of upper-inner quadrant of right female breast: Secondary | ICD-10-CM | POA: Diagnosis not present

## 2016-06-04 LAB — CBC
HCT: 35.8 % (ref 35.0–47.0)
HEMOGLOBIN: 12.3 g/dL (ref 12.0–16.0)
MCH: 32.5 pg (ref 26.0–34.0)
MCHC: 34.5 g/dL (ref 32.0–36.0)
MCV: 94.3 fL (ref 80.0–100.0)
Platelets: 453 10*3/uL — ABNORMAL HIGH (ref 150–440)
RBC: 3.79 MIL/uL — AB (ref 3.80–5.20)
RDW: 13.2 % (ref 11.5–14.5)
WBC: 7.7 10*3/uL (ref 3.6–11.0)

## 2016-06-05 ENCOUNTER — Ambulatory Visit
Admission: RE | Admit: 2016-06-05 | Discharge: 2016-06-05 | Disposition: A | Discharge status: Home / Self Care | Payer: Medicare Other | Source: Ambulatory Visit | Attending: Radiation Oncology | Admitting: Radiation Oncology

## 2016-06-05 DIAGNOSIS — C50211 Malignant neoplasm of upper-inner quadrant of right female breast: Secondary | ICD-10-CM | POA: Diagnosis not present

## 2016-06-08 ENCOUNTER — Ambulatory Visit
Admission: RE | Admit: 2016-06-08 | Discharge: 2016-06-08 | Disposition: A | Discharge status: Home / Self Care | Payer: Medicare Other | Source: Ambulatory Visit | Attending: Radiation Oncology | Admitting: Radiation Oncology

## 2016-06-08 DIAGNOSIS — C50211 Malignant neoplasm of upper-inner quadrant of right female breast: Secondary | ICD-10-CM | POA: Diagnosis not present

## 2016-06-09 ENCOUNTER — Ambulatory Visit
Admission: RE | Admit: 2016-06-09 | Discharge: 2016-06-09 | Disposition: A | Discharge status: Home / Self Care | Payer: Medicare Other | Source: Ambulatory Visit | Attending: Radiation Oncology | Admitting: Radiation Oncology

## 2016-06-09 DIAGNOSIS — C50211 Malignant neoplasm of upper-inner quadrant of right female breast: Secondary | ICD-10-CM | POA: Diagnosis not present

## 2016-06-10 ENCOUNTER — Ambulatory Visit
Admission: RE | Admit: 2016-06-10 | Discharge: 2016-06-10 | Disposition: A | Discharge status: Home / Self Care | Payer: Medicare Other | Source: Ambulatory Visit | Attending: Radiation Oncology | Admitting: Radiation Oncology

## 2016-06-10 DIAGNOSIS — C50211 Malignant neoplasm of upper-inner quadrant of right female breast: Secondary | ICD-10-CM | POA: Diagnosis not present

## 2016-06-11 ENCOUNTER — Ambulatory Visit
Admission: RE | Admit: 2016-06-11 | Discharge: 2016-06-11 | Disposition: A | Discharge status: Home / Self Care | Payer: Medicare Other | Source: Ambulatory Visit | Attending: Radiation Oncology | Admitting: Radiation Oncology

## 2016-06-11 DIAGNOSIS — C50211 Malignant neoplasm of upper-inner quadrant of right female breast: Secondary | ICD-10-CM | POA: Diagnosis not present

## 2016-06-12 ENCOUNTER — Ambulatory Visit
Admission: RE | Admit: 2016-06-12 | Discharge: 2016-06-12 | Disposition: A | Discharge status: Home / Self Care | Payer: Medicare Other | Source: Ambulatory Visit | Attending: Radiation Oncology | Admitting: Radiation Oncology

## 2016-06-12 DIAGNOSIS — C50211 Malignant neoplasm of upper-inner quadrant of right female breast: Secondary | ICD-10-CM | POA: Diagnosis not present

## 2016-06-15 ENCOUNTER — Ambulatory Visit
Admission: RE | Admit: 2016-06-15 | Discharge: 2016-06-15 | Disposition: A | Discharge status: Home / Self Care | Payer: Medicare Other | Source: Ambulatory Visit | Attending: Radiation Oncology | Admitting: Radiation Oncology

## 2016-06-15 DIAGNOSIS — C50211 Malignant neoplasm of upper-inner quadrant of right female breast: Secondary | ICD-10-CM | POA: Diagnosis not present

## 2016-06-16 ENCOUNTER — Ambulatory Visit: Payer: Medicare Other

## 2016-06-16 ENCOUNTER — Ambulatory Visit
Admission: RE | Admit: 2016-06-16 | Discharge: 2016-06-16 | Disposition: A | Discharge status: Home / Self Care | Payer: Medicare Other | Source: Ambulatory Visit | Attending: Radiation Oncology | Admitting: Radiation Oncology

## 2016-06-16 DIAGNOSIS — C50211 Malignant neoplasm of upper-inner quadrant of right female breast: Secondary | ICD-10-CM | POA: Diagnosis not present

## 2016-06-17 ENCOUNTER — Ambulatory Visit
Admission: RE | Admit: 2016-06-17 | Discharge: 2016-06-17 | Disposition: A | Discharge status: Home / Self Care | Payer: Medicare Other | Source: Ambulatory Visit | Attending: Radiation Oncology | Admitting: Radiation Oncology

## 2016-06-17 DIAGNOSIS — C50211 Malignant neoplasm of upper-inner quadrant of right female breast: Secondary | ICD-10-CM | POA: Diagnosis not present

## 2016-06-18 ENCOUNTER — Inpatient Hospital Stay: Payer: Medicare Other

## 2016-06-18 ENCOUNTER — Ambulatory Visit
Admission: RE | Admit: 2016-06-18 | Discharge: 2016-06-18 | Disposition: A | Discharge status: Home / Self Care | Payer: Medicare Other | Source: Ambulatory Visit | Attending: Radiation Oncology | Admitting: Radiation Oncology

## 2016-06-18 DIAGNOSIS — C50211 Malignant neoplasm of upper-inner quadrant of right female breast: Secondary | ICD-10-CM | POA: Diagnosis not present

## 2016-06-19 ENCOUNTER — Ambulatory Visit
Admission: RE | Admit: 2016-06-19 | Discharge: 2016-06-19 | Disposition: A | Discharge status: Home / Self Care | Payer: Medicare Other | Source: Ambulatory Visit | Attending: Radiation Oncology | Admitting: Radiation Oncology

## 2016-06-19 DIAGNOSIS — C50211 Malignant neoplasm of upper-inner quadrant of right female breast: Secondary | ICD-10-CM | POA: Diagnosis not present

## 2016-06-22 ENCOUNTER — Ambulatory Visit
Admission: RE | Admit: 2016-06-22 | Discharge: 2016-06-22 | Disposition: A | Discharge status: Home / Self Care | Payer: Medicare Other | Source: Ambulatory Visit | Attending: Radiation Oncology | Admitting: Radiation Oncology

## 2016-06-22 DIAGNOSIS — C50211 Malignant neoplasm of upper-inner quadrant of right female breast: Secondary | ICD-10-CM | POA: Diagnosis not present

## 2016-06-23 ENCOUNTER — Ambulatory Visit (INDEPENDENT_AMBULATORY_CARE_PROVIDER_SITE_OTHER): Payer: Medicare Other | Admitting: General Surgery

## 2016-06-23 ENCOUNTER — Ambulatory Visit
Admission: RE | Admit: 2016-06-23 | Discharge: 2016-06-23 | Disposition: A | Discharge status: Home / Self Care | Payer: Medicare Other | Source: Ambulatory Visit | Attending: Radiation Oncology | Admitting: Radiation Oncology

## 2016-06-23 ENCOUNTER — Encounter: Payer: Self-pay | Admitting: General Surgery

## 2016-06-23 VITALS — BP 112/66 | HR 72 | Resp 12 | Ht 62.0 in | Wt 130.0 lb

## 2016-06-23 DIAGNOSIS — C50211 Malignant neoplasm of upper-inner quadrant of right female breast: Secondary | ICD-10-CM | POA: Diagnosis not present

## 2016-06-23 DIAGNOSIS — Z170 Estrogen receptor positive status [ER+]: Secondary | ICD-10-CM

## 2016-06-23 MED ORDER — LETROZOLE 2.5 MG PO TABS: 2.5000 mg | ORAL_TABLET | Freq: Every day | ORAL | 3 refills | Status: DC

## 2016-06-23 NOTE — Patient Instructions (Signed)
Patient to return in September, 2018 for right breast diagnotic mammogram.

## 2016-06-23 NOTE — Progress Notes (Signed)
Patient ID: Elizabeth Bailey, female   DOB: August 16, 1940, 76 y.o.   MRN: [id number]  Chief Complaint  Patient presents with  . Breast Cancer    HPI TEIGAN MANNER is a 76 y.o. female here today for her follow up right breast wide excision done on 04/09/2016. Patient states she is doing well.  The right breast is still sore. Friday is her last radiation treatment.  HPI  Past Medical History:  Diagnosis Date  . Arthritis   . Asthma   . 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 Chronic bronchitis ([place])   . Collagen vascular disease ([place])   . Depression   . Diverticulosis   . GERD (gastroesophageal reflux disease)   . High cholesterol   . Hypertension   . Seasonal allergies     Past Surgical History:  Procedure Laterality Date  . ABDOMINAL HYSTERECTOMY    . BREAST BIOPSY  1970's  . BREAST BIOPSY Right 03/27/2016   INVASIVE MAMMARY CARCINOMA  . BREAST LUMPECTOMY WITH SENTINEL LYMPH NODE BIOPSY Right 04/09/2016   Procedure: BREAST LUMPECTOMY WITH SENTINEL LYMPH NODE BX;  Surgeon: Christene Lye, MD;  Location: [facility] ORS;  Service: General;  Laterality: Right;  . CAROTID ENDARTERECTOMY Left   . CHOLECYSTECTOMY    . COLONOSCOPY  2005  . DG  BONE DENSITY ([place] HX)  2010  . DIAGNOSTIC MAMMOGRAM  2015  . ESOPHAGOGASTRODUODENOSCOPY (EGD) WITH PROPOFOL N/A 05/05/2016   Procedure: ESOPHAGOGASTRODUODENOSCOPY (EGD) WITH PROPOFOL;  Surgeon: Lucilla Lame, MD;  Location: [facility] ENDOSCOPY;  Service: Endoscopy;  Laterality: N/A;  . EUS N/A 03/12/2016   Procedure: FULL UPPER ENDOSCOPIC ULTRASOUND (EUS) RADIAL;  Surgeon: Holly Bodily, MD;  Location: [facility] ENDOSCOPY;  Service: Endoscopy;  Laterality: N/A;  . GALLBLADDER SURGERY    . PANCREAS SURGERY  01/24/2016   Dr Burt Knack  . pap smear  2014  . REPAIR OF PERFORATED ULCER N/A 01/24/2016   Procedure: Exploratory Laparotomy with biopsy of Pancreas;  Surgeon: Florene Glen, MD;  Location: [facility] ORS;   Service: General;  Laterality: N/A;  . UPPER GI ENDOSCOPY  03/12/2016   Dr Mont Dutton    Family History  Problem Relation Age of Onset  . Pneumonia Mother   . Depression Mother   . Depression Sister   . Breast cancer Sister 13  . Diabetes Sister   . Stroke Brother   . Heart disease Brother     Social History Social History  Substance Use Topics  . Smoking status: Former Smoker    Packs/day: 0.50    Years: 10.00    Types: Cigarettes    Quit date: 1988  . Smokeless tobacco: Never Used     Comment: quit 1988  . Alcohol use No     Comment: wine twice a month     Allergies  Allergen Reactions  . Cyclobenzaprine     Other reaction(s): Other (See Comments) Frequent urination  . Flucytosine Other (See Comments)    Sore mouth  . Fluticasone-Salmeterol Other (See Comments)    Other reaction(s): Other (See Comments) Sore mouth  . Naproxen Nausea Only    GI upset    Current Outpatient Prescriptions  Medication Sig Dispense Refill  . acetaminophen (TYLENOL) 325 MG tablet Take 650 mg by mouth every 6 (six) hours as needed for mild pain.    . Adalimumab (HUMIRA PEN) 40 MG/0.8ML PNKT Inject 40 mg into the skin every 21 ( twenty-one) days.  TAKES EVERY OTHER WEEK  LAST DOSE WAS IN 01-2016    . albuterol (PROVENTIL HFA;VENTOLIN HFA) 108 (90 Base) MCG/ACT inhaler Inhale 2 puffs into the lungs every 6 (six) hours as needed for wheezing or shortness of breath.    . bisacodyl (DULCOLAX) 10 MG suppository Place 1 suppository (10 mg total) rectally daily as needed for moderate constipation. 12 suppository 0  . budesonide-formoterol (SYMBICORT) 160-4.5 MCG/ACT inhaler Inhale 1 puff into the lungs 2 (two) times daily. 1 Inhaler 12  . diphenhydramine-acetaminophen (TYLENOL PM) 25-500 MG TABS tablet Take 1-2 tablets by mouth at bedtime as needed (SLEEP). 1-2 TABLETS DEPENDING ON DIFFICULTLY SLEEPING    . esomeprazole (NEXIUM) 40 MG capsule Take 1 capsule (40 mg total) by mouth 2 (two) times  daily before a meal. 60 capsule 6  . feeding supplement (BOOST / RESOURCE BREEZE) LIQD Take 1 Container by mouth 3 (three) times daily between meals.  0  . fluticasone (FLONASE) 50 MCG/ACT nasal spray Place 2 sprays into the nose daily as needed for allergies or rhinitis.     Marland Kitchen HYDROcodone-acetaminophen (NORCO/VICODIN) 5-325 MG tablet Take 1 tablet by mouth at bedtime as needed for moderate pain.     Marland Kitchen losartan (COZAAR) 50 MG tablet Take 1 tablet (50 mg total) by mouth daily. 90 tablet 3  . Melatonin 10 MG TABS Take 2 tablets by mouth at bedtime. Reported on 06/03/2015    . montelukast (SINGULAIR) 10 MG tablet TAKE 1 TABLET BY MOUTH ONCE DAILY. 30 tablet 12  . Omega 3 1000 MG CAPS Take 1,000 mg by mouth daily. omega-3 fatty acids-vitamin E (FISH OIL) 1,000 mg    . ondansetron (ZOFRAN) 4 MG tablet Take 1 tablet (4 mg total) by mouth every 6 (six) hours as needed for nausea. 20 tablet 0  . simvastatin (ZOCOR) 20 MG tablet Take 1 tablet every other night. (Patient taking differently: Take 20 mg by mouth every other day. Take 1 tablet every night.) 30 tablet 12  . traMADol (ULTRAM) 50 MG tablet Take 1 tablet (50 mg total) by mouth every 6 (six) hours as needed. 90 tablet 3  . letrozole (FEMARA) 2.5 MG tablet Take 1 tablet (2.5 mg total) by mouth daily. 90 tablet 3   No current facility-administered medications for this visit.     Review of Systems Review of Systems  Constitutional: Negative.   Respiratory: Negative.   Cardiovascular: Negative.     Blood pressure 112/66, pulse 72, resp. rate 12, height 5' 2"  (1.575 m), weight 130 lb (59 kg).  Physical Exam Physical Exam  Constitutional: She is oriented to person, place, and time. She appears well-developed and well-nourished.  Eyes: Conjunctivae are normal. No scleral icterus.  Neck: Neck supple.  Cardiovascular: Normal rate, regular rhythm and normal heart sounds.   Pulmonary/Chest: Effort normal and breath sounds normal. Right breast  exhibits tenderness. Right breast exhibits no inverted nipple, no mass, no nipple discharge and no skin change. Left breast exhibits no inverted nipple, no mass, no nipple discharge, no skin change and no tenderness.    Right breast incision is clean and healing well.   Abdominal: Soft. Bowel sounds are normal.  Lymphadenopathy:    She has no cervical adenopathy.  Neurological: She is alert and oriented to person, place, and time.  Skin: Skin is warm and dry.  Psychiatric: Her behavior is normal.    Data Reviewed Prior notes reviewed  Her mark test reviewed - negative Mammoprint reviewed - low risk Assessment  Invasive mammary carcinoma T1c,N0. Stage 1 ER/PR positive Her2 equivocal. Her mark test negative. Mammoprint results showed low risk. Status post wide excision. Radiation treatment will be completed this week. Patient to start Femara therapy in about a week to 10 days after completing radiation. Discussed details with patient and she is agreeable.     Plan   Femara sent to pharmacy.  Patient to return in September, 2018 with right breast diagnotic mammogram.     HPI, Physical Exam, Assessment and Plan have been scribed under the direction and in the presence of Mckinley Jewel, MD  Gaspar Cola, CMA  I have completed the exam and reviewed the above documentation for accuracy and completeness.  I agree with the above.  Haematologist has been used and any errors in dictation or transcription are unintentional.  Seeplaputhur G. Jamal Collin, M.D., F.A.C.S.  Junie Panning G 06/23/2016, 12:13 PM

## 2016-06-24 ENCOUNTER — Ambulatory Visit
Admission: RE | Admit: 2016-06-24 | Discharge: 2016-06-24 | Disposition: A | Discharge status: Home / Self Care | Payer: Medicare Other | Source: Ambulatory Visit | Attending: Radiation Oncology | Admitting: Radiation Oncology

## 2016-06-24 DIAGNOSIS — C50211 Malignant neoplasm of upper-inner quadrant of right female breast: Secondary | ICD-10-CM | POA: Diagnosis not present

## 2016-06-25 ENCOUNTER — Ambulatory Visit: Payer: Medicare Other

## 2016-06-25 ENCOUNTER — Ambulatory Visit
Admission: RE | Admit: 2016-06-25 | Discharge: 2016-06-25 | Disposition: A | Discharge status: Home / Self Care | Payer: Medicare Other | Source: Ambulatory Visit | Attending: Radiation Oncology | Admitting: Radiation Oncology

## 2016-06-25 DIAGNOSIS — C50211 Malignant neoplasm of upper-inner quadrant of right female breast: Secondary | ICD-10-CM | POA: Diagnosis not present

## 2016-06-26 ENCOUNTER — Ambulatory Visit
Admission: RE | Admit: 2016-06-26 | Discharge: 2016-06-26 | Disposition: A | Discharge status: Home / Self Care | Payer: Medicare Other | Source: Ambulatory Visit | Attending: Radiation Oncology | Admitting: Radiation Oncology

## 2016-06-26 ENCOUNTER — Ambulatory Visit: Payer: Medicare Other

## 2016-06-26 DIAGNOSIS — C50211 Malignant neoplasm of upper-inner quadrant of right female breast: Secondary | ICD-10-CM | POA: Diagnosis not present

## 2016-06-29 ENCOUNTER — Ambulatory Visit: Payer: Medicare Other

## 2016-06-30 ENCOUNTER — Ambulatory Visit: Payer: Medicare Other

## 2016-07-01 ENCOUNTER — Ambulatory Visit: Payer: Medicare Other

## 2016-07-02 ENCOUNTER — Ambulatory Visit: Payer: Medicare Other

## 2016-07-03 ENCOUNTER — Ambulatory Visit: Payer: Medicare Other

## 2016-07-06 ENCOUNTER — Ambulatory Visit: Payer: Medicare Other

## 2016-07-07 ENCOUNTER — Ambulatory Visit: Payer: Medicare Other

## 2016-07-08 ENCOUNTER — Ambulatory Visit: Payer: Medicare Other

## 2016-07-09 ENCOUNTER — Ambulatory Visit: Payer: Medicare Other

## 2016-07-10 ENCOUNTER — Ambulatory Visit: Payer: Medicare Other

## 2016-08-06 ENCOUNTER — Ambulatory Visit
Admission: RE | Admit: 2016-08-06 | Discharge: 2016-08-06 | Disposition: A | Discharge status: Home / Self Care | Payer: Medicare Other | Source: Ambulatory Visit | Attending: Radiation Oncology | Admitting: Radiation Oncology

## 2016-08-06 ENCOUNTER — Encounter: Payer: Self-pay | Admitting: Radiation Oncology

## 2016-08-06 VITALS — BP 126/74 | HR 77 | Temp 95.8°F | Resp 20 | Wt 132.5 lb

## 2016-08-06 DIAGNOSIS — Z170 Estrogen receptor positive status [ER+]: Secondary | ICD-10-CM | POA: Diagnosis not present

## 2016-08-06 DIAGNOSIS — Z923 Personal history of irradiation: Secondary | ICD-10-CM | POA: Insufficient documentation

## 2016-08-06 DIAGNOSIS — Z79811 Long term (current) use of aromatase inhibitors: Secondary | ICD-10-CM | POA: Insufficient documentation

## 2016-08-06 DIAGNOSIS — C50211 Malignant neoplasm of upper-inner quadrant of right female breast: Secondary | ICD-10-CM | POA: Diagnosis not present

## 2016-08-06 NOTE — Progress Notes (Signed)
Radiation Oncology Follow up Note  Name: Elizabeth Bailey   Date:   08/06/2016 MRN:  [id number] DOB: [birth year]    This 76 y.o. female presents to the clinic today for one-month follow-up status post whole breast radiation to her right breast for stage I ER/PR positive invasive mammary carcinoma.  REFERRING PROVIDER: Arlis Porta., MD  HPI: patient is a 76 year old female now out 1 month having completed whole breast radiation to her right breast for ER/PR positive HER-2/neu not overexpressed stage 1 invasive mammary carcinoma. She is seen today in routine follow-up and is doing well. She specifically denies breast tenderness cough or bone pain. She has a left thumb trigger finger which is giving her some problems she may see orthopedic surgery for that.she is currently on Femara tolerating that well without side effect.  COMPLICATIONS OF TREATMENT: none  FOLLOW UP COMPLIANCE: keeps appointments   PHYSICAL EXAM:  BP 126/74   Pulse 77   Temp (!) 95.8 F (35.4 C)   Resp 20   Wt 132 lb 7.9 oz (60.1 kg)   BMI 24.23 kg/m  Lungs are clear to A&P cardiac examination essentially unremarkable with regular rate and rhythm. No dominant mass or nodularity is noted in either breast in 2 positions examined. Incision is well-healed. No axillary or supraclavicular adenopathy is appreciated. Cosmetic result is excellent.she does have some hyperpigmentation of the right breast although that is resolving. Well-developed well-nourished patient in NAD. HEENT reveals PERLA, EOMI, discs not visualized.  Oral cavity is clear. No oral mucosal lesions are identified. Neck is clear without evidence of cervical or supraclavicular adenopathy. Lungs are clear to A&P. Cardiac examination is essentially unremarkable with regular rate and rhythm without murmur rub or thrill. Abdomen is benign with no organomegaly or masses noted. Motor sensory and DTR levels are equal and symmetric in the upper and lower extremities.  Cranial nerves II through XII are grossly intact. Proprioception is intact. No peripheral adenopathy or edema is identified. No motor or sensory levels are noted. Crude visual fields are within normal range.  RADIOLOGY RESULTS: no current films for review  PLAN: present time she is doing well recovering nicely from her radiation therapy treatments. I am please were overall progress. I have asked to see her back in 4-5 months for follow-up. She continues on Femara without side effect. Patient knows to call at anytime with any concerns.  I would like to take this opportunity to thank you for allowing me to participate in the care of your patient.Armstead Peaks., MD

## 2016-09-01 ENCOUNTER — Encounter: Payer: Self-pay | Admitting: Family Medicine

## 2016-09-01 ENCOUNTER — Ambulatory Visit (INDEPENDENT_AMBULATORY_CARE_PROVIDER_SITE_OTHER): Payer: Medicare Other | Admitting: Family Medicine

## 2016-09-01 VITALS — BP 114/80 | HR 68 | Temp 98.5°F | Resp 16 | Ht 62.0 in | Wt 124.0 lb

## 2016-09-01 DIAGNOSIS — K2950 Unspecified chronic gastritis without bleeding: Secondary | ICD-10-CM | POA: Diagnosis not present

## 2016-09-01 DIAGNOSIS — K263 Acute duodenal ulcer without hemorrhage or perforation: Secondary | ICD-10-CM | POA: Diagnosis not present

## 2016-09-01 MED ORDER — ONDANSETRON HCL 4 MG PO TABS: 4.0000 mg | ORAL_TABLET | Freq: Four times a day (QID) | ORAL | 1 refills | Status: DC | PRN

## 2016-09-01 MED ORDER — SUCRALFATE 1 G PO TABS: 1.0000 g | ORAL_TABLET | Freq: Three times a day (TID) | ORAL | 1 refills | Status: DC

## 2016-09-01 NOTE — Patient Instructions (Addendum)
Thank you for coming to the clinic today.  1.  Continue Nexium 40mg  twice daily, let me know if need refill  Start new medicine Carafate (sucralfate) coat lining of stomach with food (3 times daily) and bed time), may take Nausea medicine zofran as needed, plenty fluids.  [facility]) [place], [place] [address] Phone: [phone]  [facility]) [address] [place], [place] [address] Main: [place], [place] [facility])  Please schedule a Follow-up Appointment to: Return in about 3 months (around 12/02/2016) for GERD vs PUD, HTN.  If you have any other questions or concerns, please feel free to call the clinic or send a message through [place]. You may also schedule an earlier appointment if necessary.  Additionally, you may be receiving a survey about your experience at our clinic within a few days to 1 week by e-mail or mail. We value your feedback.  [name], DO [place]

## 2016-09-01 NOTE — Progress Notes (Signed)
Subjective:    Patient ID: Elizabeth Bailey, female    DOB: Jul 28, 1940, 76 y.o.   MRN: [id number]  Elizabeth Bailey is a 76 y.o. 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 She had EGD done 04/2016 with gastritis, has not followed up regularly with GI outpatient since. Continued on PPI - Reports new concern with abdominal pain again last week 4-5 days ago, describes acute abdominal pain and felt ill, but did not present to hospital. Improved with rest, hydration, pain control, thought pancreatitis. Continued to take Nexium 40mg  BID. Taking opiate pain medicine per Dr Sharlet Salina for L shoulder. However this flare has resolved. - Currently without any abdominal pain, asking about what to do next - Denies any nausea, vomiting, fevers/chills  History of Breast Cancer - Followed by General Surgery, invasive mammary carcinoma, R Breast, s/p lumpectomy and radiation therapy with resolution   Social History  Substance Use Topics  . Smoking status: Former Smoker    Packs/day: 0.50    Years: 10.00    Types: Cigarettes    Quit date: 1988  . Smokeless tobacco: Never Used     Comment: quit 1988  . Alcohol use No     Comment: wine twice a month     Review of Systems Per HPI unless specifically indicated above     Objective:    BP 114/80   Pulse 68   Temp 98.5 F (36.9 C) (Oral)   Resp 16   Ht 5\' 2"  (1.575 m)   Wt 124 lb (56.2 kg)   BMI 22.68 kg/m   Wt Readings from Last 3 Encounters:  09/01/16 124 lb (56.2 kg)  08/06/16 132 lb 7.9 oz (60.1 kg)  06/23/16 130 lb (59 kg)    Physical Exam  Constitutional: She is oriented to person, place, and time. She appears  well-developed and well-nourished. No distress.  Well-appearing, comfortable, cooperative  HENT:  Head: Normocephalic and atraumatic.  Mouth/Throat: Oropharynx is clear and moist.  Eyes: Conjunctivae are normal. Right eye exhibits no discharge. Left eye exhibits no discharge.  Cardiovascular: Normal rate, regular rhythm, normal heart sounds and intact distal pulses.   No murmur heard. Pulmonary/Chest: Effort normal and breath sounds normal. No respiratory distress. She has no rales.  Abdominal: Soft. Bowel sounds are normal. She exhibits no distension. There is no tenderness. There is no rebound.  Musculoskeletal: She exhibits no edema.  Neurological: She is alert and oriented to person, place, and time.  Skin: Skin is warm and dry. No rash noted. She is not diaphoretic. No erythema.  Psychiatric: She has a normal mood and affect. Her behavior is normal.  Well groomed, good eye contact, normal speech and thoughts  Nursing note and vitals reviewed.  Results for orders placed or performed in visit on 06/04/16  CBC  Result Value Ref Range   WBC 7.7 3.6 - 11.0 K/uL   RBC 3.79 (L) 3.80 - 5.20 MIL/uL   Hemoglobin 12.3 12.0 - 16.0 g/dL   HCT 35.8 35.0 - 47.0 %   MCV 94.3 80.0 - 100.0 fL   MCH 32.5 26.0 - 34.0 pg   MCHC 34.5 32.0 - 36.0 g/dL   RDW 13.2  11.5 - 14.5 %   Platelets 453 (H) 150 - 440 K/uL      Assessment & Plan:   Problem List Items Addressed This Visit    Gastritis without bleeding   Relevant Medications   sucralfate (CARAFATE) 1 g tablet   ondansetron (ZOFRAN) 4 MG tablet   Other Relevant Orders   Ambulatory referral to Gastroenterology   Duodenum, peptic ulcer - Primary   Relevant Medications   sucralfate (CARAFATE) 1 g tablet   ondansetron (ZOFRAN) 4 MG tablet   Other Relevant Orders   Ambulatory referral to Gastroenterology  Recent recurrence of possible pancreatitis vs PUD symptoms, seems more likely pancreatitis, do not have clear etiology given patient did  not present to hospital, self managed symptoms at home, now since resolved. Prior extensive work-up and imaging did not identify pancreatic malignancy, has had multiple surgeries recently. Last EGD 04/2016 with gastritis without ulceration  Plan: 1. Referral back to [facility] for outpatient follow-up and further guidance on GI etiology of patients symptoms 2. Continue Nexium 40mg  BID for now, consider taper down in future 3. Rx Carafate given PRN for potential PUD flare 4. Counseling on concerns with pancreatitis when to present to hospital 5. Also continue to follow-up with General Surgery as needed       Meds ordered this encounter  Medications  . sucralfate (CARAFATE) 1 g tablet    Sig: Take 1 tablet (1 g total) by mouth 4 (four) times daily -  with meals and at bedtime.    Dispense:  40 tablet    Refill:  1  . ondansetron (ZOFRAN) 4 MG tablet    Sig: Take 1 tablet (4 mg total) by mouth every 6 (six) hours as needed for nausea.    Dispense:  20 tablet    Refill:  1    Follow up plan: Return in about 3 months (around 12/02/2016) for GERD vs PUD, HTN.  [name], [facility] 09/02/2016, 1:35 AM

## 2016-09-15 ENCOUNTER — Telehealth: Payer: Self-pay | Admitting: Family Medicine

## 2016-09-15 NOTE — Telephone Encounter (Signed)
Called pt to schedule Annual Wellness Visit with [facility]  - knb  °

## 2016-09-21 ENCOUNTER — Other Ambulatory Visit: Payer: Self-pay

## 2016-09-21 DIAGNOSIS — C50211 Malignant neoplasm of upper-inner quadrant of right female breast: Secondary | ICD-10-CM

## 2016-09-21 DIAGNOSIS — Z170 Estrogen receptor positive status [ER+]: Principal | ICD-10-CM

## 2016-09-23 ENCOUNTER — Encounter: Payer: Self-pay | Admitting: Gastroenterology

## 2016-10-14 ENCOUNTER — Encounter: Payer: Self-pay | Admitting: Nurse Practitioner

## 2016-10-14 ENCOUNTER — Ambulatory Visit (INDEPENDENT_AMBULATORY_CARE_PROVIDER_SITE_OTHER): Payer: Medicare Other | Admitting: Nurse Practitioner

## 2016-10-14 VITALS — BP 156/71 | HR 72 | Temp 97.9°F | Ht 62.0 in | Wt 128.0 lb

## 2016-10-14 DIAGNOSIS — I10 Essential (primary) hypertension: Secondary | ICD-10-CM

## 2016-10-14 MED ORDER — HYDROCHLOROTHIAZIDE 25 MG PO TABS: 25.0000 mg | ORAL_TABLET | Freq: Every day | ORAL | 5 refills | Status: DC

## 2016-10-14 NOTE — Assessment & Plan Note (Signed)
Uncontrolled despite recently being controlled.  Pt notes no new changes in lifestyle or otherwise that would cause elevations in BP.  Notes has had BP meds reduced at last visit.  Plan: 1. Continue losartan 50 mg once daily. 2. START hydrochlorothiazide 25 mg once daily. 3. Reviewed s/sx of stroke. Pt verbalizes understanding. 4. Reviewed BP goals. Pt verbalizes understanding.  Call clinic if BP remains high or drops too low w/ new BP med. 5. Follow up 4 weeks w/ Dr. Raliegh Ip

## 2016-10-14 NOTE — Progress Notes (Signed)
I have reviewed this encounter including the documentation in this note and/or discussed this patient with the provider, Cassell Smiles, AGPCNP-BC. I am certifying that I agree with the content of this note as supervising physician.  [name], [facility] 10/14/2016, 10:32 AM

## 2016-10-14 NOTE — Patient Instructions (Addendum)
[place], Thank you for coming in to clinic today.  1. For your blood pressure: - Continue losartan 50 mg once daily. - START hydrochlorothiazide 25 mg once daily. - GOAL: less than 130/80 and higher than 110/70.  If you are consistently higher than 150/90, please call clinic for medication changes.   Review the signs of a stroke: Symptoms of a stroke usually develop suddenly, or you may notice them after waking up from sleep. Symptoms may include sudden:  Weakness or numbness in your face, arm, or leg, especially on one side of your body.  Trouble walking or difficulty moving your arms or legs.  Loss of balance or coordination.  Confusion.  Slurred speech (dysarthria).  Trouble speaking, understanding speech, or both (aphasia).  Vision changes-such as double vision, blurred vision, or loss of vision-inone or both eyes.  Dizziness.  Nausea and vomiting.  Severe headache with no known cause. The headache is often described as the worst headache ever experienced.   Please schedule a follow-up appointment with Dr. Parks Ranger. Return in about 4 weeks (around 11/11/2016) for blood pressure check.   If you have any other questions or concerns, please feel free to call the clinic or send a message through [place]. You may also schedule an earlier appointment if necessary.  You will receive a survey after today's visit either digitally by e-mail or paper by [employer]. Your experiences and feedback matter to [place].  Please respond so we know how we are doing as we provide care for you.   Cassell Smiles, DNP, AGNP-BC Adult Gerontology Nurse Practitioner [place]

## 2016-10-14 NOTE — Progress Notes (Signed)
Subjective:    Patient ID: Elizabeth Bailey, female    DOB: 05/06/1940, 76 y.o.   MRN: [id number]  Elizabeth Bailey is a 76 y.o. female presenting on 10/14/2016 for Hypertension (blood pressure ranging from 155-183 / 93-105, headaches. Pt states her blood pressure medication was lower at her last visit.)   HPI Hypertension - She is checking BP at home or outside of clinic.  Readings 130/80s typically before Tuesday when she obtained readings 155-183 / 39-105.  Pt expresses concern for having a stroke w/ BP readings remaining high.  - Current medications: losartan 50 mg, tolerating well without side effects Previously on losartan 100 mg w/ lower BP readings and was on losartan-hydrochlorothiazide 100-25 mg in 2017 which indicates downward trend for BP control over time.   - She is symptomatic with headache and high blood pressure reading. - Pt denies lightheadedness, dizziness, changes in vision, chest tightness/pressure, palpitations, leg swelling, sudden loss of speech or loss of consciousness. - She  reports an exercise routine that includes walking, 6 days per week. - Her diet is low in salt, moderate in fat, and moderate in carbohydrates.  Social History  Substance Use Topics  . Smoking status: Former Smoker    Packs/day: 0.50    Years: 10.00    Types: Cigarettes    Quit date: 1988  . Smokeless tobacco: Never Used     Comment: quit 1988  . Alcohol use No     Comment: wine twice a month     Review of Systems Per HPI unless specifically indicated above     Objective:    BP (!) 156/71 (BP Location: Left Arm, Patient Position: Sitting, Cuff Size: Normal)   Pulse 72   Temp 97.9 F (36.6 C) (Oral)   Ht 5\' 2"  (1.575 m)   Wt 128 lb (58.1 kg)   BMI 23.41 kg/m   Wt Readings from Last 3 Encounters:  10/14/16 128 lb (58.1 kg)  09/01/16 124 lb (56.2 kg)  08/06/16 132 lb 7.9 oz (60.1 kg)    Physical Exam  General - healthy, well-appearing, NAD HEENT - Normocephalic,  atraumatic Neck - supple, non-tender, no LAD, no JVD Heart - RRR w/ occasional extrasystole, no murmurs heard Lungs - Clear throughout all lobes, no wheezing, crackles, or rhonchi. Normal work of breathing. Extremeties - non-tender, no edema, cap refill < 2 seconds, peripheral pulses intact +2 bilaterally Skin - warm, dry Neuro - awake, alert, oriented x3, normal gait Psych - Normal mood and affect, normal behavior    Results for orders placed or performed in visit on 06/04/16  CBC  Result Value Ref Range   WBC 7.7 3.6 - 11.0 K/uL   RBC 3.79 (L) 3.80 - 5.20 MIL/uL   Hemoglobin 12.3 12.0 - 16.0 g/dL   HCT 35.8 35.0 - 47.0 %   MCV 94.3 80.0 - 100.0 fL   MCH 32.5 26.0 - 34.0 pg   MCHC 34.5 32.0 - 36.0 g/dL   RDW 13.2 11.5 - 14.5 %   Platelets 453 (H) 150 - 440 K/uL      Assessment & Plan:   Problem List Items Addressed This Visit      Cardiovascular and Mediastinum   Hypertension - Primary    Uncontrolled despite recently being controlled.  Pt notes no new changes in lifestyle or otherwise that would cause elevations in BP.  Notes has had BP meds reduced at last visit.  Plan: 1. Continue losartan 50 mg once daily.  2. START hydrochlorothiazide 25 mg once daily. 3. Reviewed s/sx of stroke. Pt verbalizes understanding. 4. Reviewed BP goals. Pt verbalizes understanding.  Call clinic if BP remains high or drops too low w/ new BP med. 5. Follow up 4 weeks w/ Dr. Raliegh Ip      Relevant Medications   hydrochlorothiazide (HYDRODIURIL) 25 MG tablet      Meds ordered this encounter  Medications  . hydrochlorothiazide (HYDRODIURIL) 25 MG tablet    Sig: Take 1 tablet (25 mg total) by mouth daily.    Dispense:  30 tablet    Refill:  5    Order Specific Question:   Supervising Provider    Answer:   Olin Hauser [[id number]]    Follow up plan: Return in about 4 weeks (around 11/11/2016) for blood pressure check.  Cassell Smiles, DNP, AGPCNP-BC Adult Gerontology Primary Care Nurse  Practitioner [facility] 10/14/2016, 10:00 AM

## 2016-10-19 ENCOUNTER — Other Ambulatory Visit: Payer: Self-pay

## 2016-10-19 ENCOUNTER — Telehealth: Payer: Self-pay | Admitting: Family Medicine

## 2016-10-19 DIAGNOSIS — M069 Rheumatoid arthritis, unspecified: Secondary | ICD-10-CM

## 2016-10-19 MED ORDER — TRAMADOL HCL 50 MG PO TABS: 50.0000 mg | ORAL_TABLET | Freq: Four times a day (QID) | ORAL | 0 refills | Status: DC | PRN

## 2016-10-19 NOTE — Telephone Encounter (Signed)
Pt needs a refill on tramadol sent to [facility].  Her call back number is [phone]

## 2016-10-19 NOTE — Telephone Encounter (Signed)
Send it to [facility] for approval.

## 2016-11-03 ENCOUNTER — Other Ambulatory Visit: Payer: Self-pay

## 2016-11-03 DIAGNOSIS — J302 Other seasonal allergic rhinitis: Secondary | ICD-10-CM

## 2016-11-03 MED ORDER — MONTELUKAST SODIUM 10 MG PO TABS: 10.0000 mg | ORAL_TABLET | Freq: Every day | ORAL | 5 refills | Status: DC

## 2016-11-24 ENCOUNTER — Other Ambulatory Visit: Payer: Self-pay

## 2016-11-24 DIAGNOSIS — C50211 Malignant neoplasm of upper-inner quadrant of right female breast: Secondary | ICD-10-CM

## 2016-11-24 DIAGNOSIS — Z170 Estrogen receptor positive status [ER+]: Principal | ICD-10-CM

## 2016-11-30 ENCOUNTER — Ambulatory Visit (INDEPENDENT_AMBULATORY_CARE_PROVIDER_SITE_OTHER): Payer: Medicare Other | Admitting: Gastroenterology

## 2016-11-30 ENCOUNTER — Telehealth: Payer: Self-pay

## 2016-11-30 VITALS — BP 121/64 | HR 80 | Temp 98.1°F | Ht 62.0 in | Wt 134.6 lb

## 2016-11-30 DIAGNOSIS — R197 Diarrhea, unspecified: Secondary | ICD-10-CM

## 2016-11-30 DIAGNOSIS — K279 Peptic ulcer, site unspecified, unspecified as acute or chronic, without hemorrhage or perforation: Secondary | ICD-10-CM | POA: Diagnosis not present

## 2016-11-30 NOTE — Progress Notes (Signed)
Cephas Darby, MD [address]  [place]  [place], [place] [address]  Main: [phone]  Fax: [phone]    Gastroenterology Consultation  Referring Provider:     [name] Primary Care Physician:  Olin Hauser, DO Primary Gastroenterologist:  Dr. Cephas Darby Reason for Consultation:     Spitting up mucus        HPI:   Elizabeth Bailey is a 76 y.o. y/o female referred by Dr. Parks Ranger, Devonne Doughty, DO  for consultation & management of spitting up mucus.  She was recently diagnosed with right breast cancer stage I ER/PR positive invasive mammary carcinoma, underwent wide excision and radiation treatment. She is doing well from that standpoint. She was previously seen by my GI colleagues, Dr. Vicente Males and Dr. Allen Norris. She had a complicated hospital course in December/2017 when she was admitted with abdominal pain, CT revealed possible perforated duodenal ulcer, at that time underwent ex-lap and was found to have questionable mass in the pancreas underwent biopsy which was benign tissue. She subsequently underwent EGD/EUS and was incidentally found to have 57mm duodenal ulcer. And, EUS revealed normal pancreas with minimal inflammation. IgG4 levels were also normal. She had repeat EGD in 04/2016 which confirmed healing of the duodenal ulcer. Interestingly gastric biopsies revealed acute gastritis. H. pylori was not detected. Reports that she was taking Aleve twice daily prior to this hospitalization.  Currently, she has been experiencing frequent spitting up of mucus coming from throat both day and night. This started when she was hospitalized in 01/2016 and persisted. She is on Nexium 2 times daily before breakfast and at bedtime. She denies heartburn, regurgitation, acid taste in mouth, epigastric pain, chest pain. She reports having sinus issues and has been taking Singulair and Symbicort. She is also on Humira for rheumatoid arthritis. She gained weight in last  6 months.  GI Procedures:  EGD 05/05/16 - Small hiatal hernia. - Gastritis. Biopsied. - No gross lesions in the entire examined duodenum.  DIAGNOSIS:  A. STOMACH; COLD BIOPSY:  - MILD CHRONIC ACTIVE GASTRITIS.  - NEGATIVE FOR DYSPLASIA AND MALIGNANCY.  ADDENDUM:  Immunostain for H. pylori is negative with a working control.    Biopsy of the pancreas head mass on ex-lap DIAGNOSIS:  A. PANCREATIC HEAD MASS; BIOPSY:  - FEATURES SUGGESTIVE OF RUPTURED INFLAMED CYSTIC STRUCTURE WITH  REACTIVE AND ATROPHIC PANCREATIC PARENCHYMAL CHANGE.  Comment:  A duodenal diverticulum was identified. The findings in this biopsy  could be compatible with a ruptured cyst or diverticulum.   EGD/EUS 03/12/2016 EGD Impressions: - Mild Schatzki ring. - Medium-sized hiatal hernia. - Normal stomach. Biopsied to exclude H. pylori. - One duodenal ulcer. Biopsied. EUS Impressions: - Pancreatic parenchymal abnormalities consisting of hyperechoic strands and a heterogeneous appearance were noted in the pancreatic head. No obvious mass lesion seen. Suspect mild inflammatory changes. - There was otherwise no sign of significant pathology in the entire pancreas. - There was no sign of significant pathology in the common bile duct and in the common hepatic duct. - Normal visualized portions of the liver. - Normal celiac region. DIAGNOSIS:  A. DUODENUM ULCER; COLD BIOPSY:  - MODERATE CHRONIC ACTIVE DUODENITIS WITH CHANGES COMPATIBLE WITH  PROVIDED INFORMATION OF DUODENAL ULCER.  - NEGATIVE FOR PARASITES, VIRAL CYTOPATHIC EFFECT, DYSPLASIA, AND  MALIGNANCY.   B. STOMACH; COLD BIOPSY:  - ANTRAL AND OXYNTIC MUCOSA WITH MODERATE CHRONIC NONSPECIFIC GASTRITIS.  - NEGATIVE FOR H. PYLORI, DYSPLASIA, AND MALIGNANCY.  Past Medical History:  Diagnosis Date  . Arthritis   . Asthma   . Cancer ([place]) 03/27/2016   Invasive mammary carcinoma right breast Stage 1 ER/PR positive  . Chronic bronchitis ([place])   . Collagen  vascular disease ([place])   . Depression   . Diverticulosis   . GERD (gastroesophageal reflux disease)   . High cholesterol   . Hypertension   . Seasonal allergies     Past Surgical History:  Procedure Laterality Date  . ABDOMINAL HYSTERECTOMY    . BREAST BIOPSY  1970's  . BREAST BIOPSY Right 03/27/2016   INVASIVE MAMMARY CARCINOMA  . BREAST LUMPECTOMY WITH SENTINEL LYMPH NODE BIOPSY Right 04/09/2016   Procedure: BREAST LUMPECTOMY WITH SENTINEL LYMPH NODE BX;  Surgeon: Christene Lye, MD;  Location: [facility] ORS;  Service: General;  Laterality: Right;  . CAROTID ENDARTERECTOMY Left   . CHOLECYSTECTOMY    . COLONOSCOPY  2005  . DG  BONE DENSITY ([facility] HX)  2010  . DIAGNOSTIC MAMMOGRAM  2015  . ESOPHAGOGASTRODUODENOSCOPY (EGD) WITH PROPOFOL N/A 05/05/2016   Procedure: ESOPHAGOGASTRODUODENOSCOPY (EGD) WITH PROPOFOL;  Surgeon: Lucilla Lame, MD;  Location: [facility] ENDOSCOPY;  Service: Endoscopy;  Laterality: N/A;  . EUS N/A 03/12/2016   Procedure: FULL UPPER ENDOSCOPIC ULTRASOUND (EUS) RADIAL;  Surgeon: Holly Bodily, MD;  Location: [facility] ENDOSCOPY;  Service: Endoscopy;  Laterality: N/A;  . GALLBLADDER SURGERY    . PANCREAS SURGERY  01/24/2016   Dr Burt Knack  . pap smear  2014  . REPAIR OF PERFORATED ULCER N/A 01/24/2016   Procedure: Exploratory Laparotomy with biopsy of Pancreas;  Surgeon: Florene Glen, MD;  Location: [facility] ORS;  Service: General;  Laterality: N/A;  . UPPER GI ENDOSCOPY  03/12/2016   Dr Mont Dutton    Prior to Admission medications   Medication Sig Start Date End Date Taking? Authorizing Provider  acetaminophen (TYLENOL) 325 MG tablet Take 650 mg by mouth every 6 (six) hours as needed for mild pain.    [provider]  Adalimumab (HUMIRA PEN) 40 MG/0.8ML PNKT Inject 40 mg into the skin every 21 ( twenty-one) days. TAKES EVERY OTHER WEEK  LAST DOSE WAS IN 01-2016 12/06/14   [provider]  albuterol (PROVENTIL HFA;VENTOLIN HFA) 108 (90 Base) MCG/ACT  inhaler Inhale 2 puffs into the lungs every 6 (six) hours as needed for wheezing or shortness of breath.    [provider]  bisacodyl (DULCOLAX) 10 MG suppository Place 1 suppository (10 mg total) rectally daily as needed for moderate constipation. Patient not taking: Reported on 10/14/2016 01/31/16   Olean Ree, MD  budesonide-formoterol [facility]) 160-4.5 MCG/ACT inhaler Inhale 1 puff into the lungs 2 (two) times daily. 12/05/15   Arlis Porta., MD  diphenhydramine-acetaminophen (TYLENOL PM) 25-500 MG TABS tablet Take 1-2 tablets by mouth at bedtime as needed (SLEEP). 1-2 TABLETS DEPENDING ON DIFFICULTLY SLEEPING    [provider]  esomeprazole (NEXIUM) 40 MG capsule Take 1 capsule (40 mg total) by mouth 2 (two) times daily before a meal. 03/17/16   Luan Pulling, Ronelle Nigh., MD  feeding supplement (BOOST / RESOURCE BREEZE) LIQD Take 1 Container by mouth 3 (three) times daily between meals. 01/31/16   Olean Ree, MD  fluticasone (FLONASE) 50 MCG/ACT nasal spray Place 2 sprays into the nose daily as needed for allergies or rhinitis.     [provider]  hydrochlorothiazide (HYDRODIURIL) 25 MG tablet Take 1 tablet (25 mg total) by mouth daily. 10/14/16   Mikey College, NP  HYDROcodone-acetaminophen (  NORCO/VICODIN) 5-325 MG tablet Take 1 tablet by mouth at bedtime as needed for moderate pain.  11/28/15   [provider]  letrozole (FEMARA) 2.5 MG tablet Take 1 tablet (2.5 mg total) by mouth daily. 06/23/16   Christene Lye, MD  losartan (COZAAR) 50 MG tablet Take 1 tablet (50 mg total) by mouth daily. 04/06/16   Arlis Porta., MD  Melatonin 10 MG TABS Take 2 tablets by mouth at bedtime. Reported on 06/03/2015    [provider]  montelukast (SINGULAIR) 10 MG tablet Take 1 tablet (10 mg total) by mouth daily. 11/03/16   Karamalegos, Alexander J, DO  Omega 3 1000 MG CAPS Take 1,000 mg by mouth daily. omega-3 fatty acids-vitamin E (FISH OIL)  1,000 mg    [provider]  ondansetron (ZOFRAN) 4 MG tablet Take 1 tablet (4 mg total) by mouth every 6 (six) hours as needed for nausea. Patient not taking: Reported on 10/14/2016 09/01/16   Olin Hauser, DO  simvastatin (ZOCOR) 20 MG tablet Take 1 tablet every other night. Patient taking differently: Take 20 mg by mouth every other day. Take 1 tablet every night. 01/09/15   Arlis Porta., MD  sucralfate (CARAFATE) 1 g tablet Take 1 tablet (1 g total) by mouth 4 (four) times daily -  with meals and at bedtime. 09/01/16   Karamalegos, Devonne Doughty, DO  traMADol (ULTRAM) 50 MG tablet Take 1 tablet (50 mg total) by mouth every 6 (six) hours as needed. 10/19/16   Mikey College, NP    Family History  Problem Relation Age of Onset  . Pneumonia Mother   . Depression Mother   . Depression Sister   . Breast cancer Sister 32  . Diabetes Sister   . Stroke Brother   . Heart disease Brother      Social History  Substance Use Topics  . Smoking status: Former Smoker    Packs/day: 0.50    Years: 10.00    Types: Cigarettes    Quit date: 1988  . Smokeless tobacco: Never Used     Comment: quit 1988  . Alcohol use No     Comment: wine twice a month     Allergies as of 11/30/2016 - Review Complete 11/30/2016  Allergen Reaction Noted  . Cyclobenzaprine  03/28/2015  . Flucytosine Other (See Comments) 11/28/2014  . Fluticasone-salmeterol Other (See Comments) 03/28/2015  . Naproxen Nausea Only 11/28/2014    Review of Systems:    All systems reviewed and negative except where noted in HPI.   Physical Exam:  BP 121/64   Pulse 80   Temp 98.1 F (36.7 C)   Ht 5\' 2"  (1.575 m)   Wt 134 lb 9.6 oz (61.1 kg)   BMI 24.62 kg/m  No LMP recorded. Patient has had a hysterectomy.  General:   Alert,  Well-developed, well-nourished, pleasant and cooperative in NAD Head:  Normocephalic and atraumatic. Eyes:  Sclera clear, no icterus.   Conjunctiva pink. Ears:  Normal  auditory acuity. Nose:  No deformity, discharge, or lesions. Mouth:  No deformity or lesions,oropharynx pink & moist. Neck:  Supple; no masses or thyromegaly. Lungs:  Respirations even and unlabored.  Clear throughout to auscultation.   No wheezes, crackles, or rhonchi. No acute distress. Heart:  Regular rate and rhythm; no murmurs, clicks, rubs, or gallops. Abdomen:  Normal bowel sounds.  No bruits.  Soft, non-tender and non-distended without masses, hepatosplenomegaly or hernias noted.  No guarding  or rebound tenderness.   Rectal: Nor performed Msk:  Symmetrical without gross deformities. Good, equal movement & strength bilaterally. Pulses:  Normal pulses noted. Extremities:  No clubbing or edema.  No cyanosis. Neurologic:  Alert and oriented x3;  grossly normal neurologically. Skin:  Intact without significant lesions or rashes. No jaundice. Psych:  Alert and cooperative. Normal mood and affect.  Imaging Studies: CT and MRCP were reviewed from 01/2016  Assessment and Plan:   Elizabeth Bailey is a 76 y.o. female with Recent diagnosis of right breast cancer that is post wide excision and radiation, history of rheumatoid arthritis on Humira, history of duodenal ulcer likely secondary to NSAID use. Now with about 9 months history of spitting up mucus from throat despite being on Nexium twice daily. Her symptoms do not suggest typical GERD. Probably secondary to chronic sinus issues leading to postnasal drip. Her duodenal ulcer is healed. Therefore, I recommend changing Nexium to just once a day. Given history of duodenal ulcer, and chronic active gastritis I will check H. pylori serology and treat if positive.  Follow up in 4 weeks   Cephas Darby, MD

## 2016-11-30 NOTE — Telephone Encounter (Signed)
Patient has been notified of CT of Abdomen for 12/04/16 at [place]  3:45pm.  Nothing to eat or drink 4 hours prior, asked patient to pick up kit before CT. Thanks [employer]

## 2016-12-07 ENCOUNTER — Encounter: Payer: Self-pay | Admitting: Family Medicine

## 2016-12-07 ENCOUNTER — Ambulatory Visit (INDEPENDENT_AMBULATORY_CARE_PROVIDER_SITE_OTHER): Payer: Medicare Other | Admitting: Family Medicine

## 2016-12-07 VITALS — BP 120/70 | HR 69 | Resp 16 | Ht 62.0 in | Wt 136.6 lb

## 2016-12-07 DIAGNOSIS — H6121 Impacted cerumen, right ear: Secondary | ICD-10-CM

## 2016-12-07 DIAGNOSIS — K219 Gastro-esophageal reflux disease without esophagitis: Secondary | ICD-10-CM | POA: Insufficient documentation

## 2016-12-07 DIAGNOSIS — G8929 Other chronic pain: Secondary | ICD-10-CM

## 2016-12-07 DIAGNOSIS — R0789 Other chest pain: Secondary | ICD-10-CM | POA: Diagnosis not present

## 2016-12-07 DIAGNOSIS — J302 Other seasonal allergic rhinitis: Secondary | ICD-10-CM

## 2016-12-07 DIAGNOSIS — F5101 Primary insomnia: Secondary | ICD-10-CM | POA: Diagnosis not present

## 2016-12-07 DIAGNOSIS — J3089 Other allergic rhinitis: Secondary | ICD-10-CM

## 2016-12-07 MED ORDER — FLUTICASONE PROPIONATE 50 MCG/ACT NA SUSP: 2.0000 | Freq: Every day | NASAL | 3 refills | Status: AC

## 2016-12-07 MED ORDER — PANTOPRAZOLE SODIUM 40 MG PO TBEC: 40.0000 mg | DELAYED_RELEASE_TABLET | Freq: Every day | ORAL | 1 refills | Status: DC

## 2016-12-07 MED ORDER — CYCLOBENZAPRINE HCL 10 MG PO TABS: 10.0000 mg | ORAL_TABLET | Freq: Every evening | ORAL | 2 refills | Status: DC | PRN

## 2016-12-07 NOTE — Patient Instructions (Addendum)
Thank you for coming to the clinic today.  1. Ear wax has been removed. Ear drum is healthy  Discontinued Nexium Start new medicine Protonix (Pantoprazole) 40mg  once daily 30 min before 1st meal of day in morning  Start nasal steroid Flonase 2 sprays in each nostril daily for 4-6 weeks, may repeat course seasonally or as needed  Follow-up with Dr Marius Ditch as planned ------------------ May ask Dr Sharlet Salina about Tramadol instead of hydrocodone  Start Cyclobenzapine (Flexeril) 10mg  tablets (muscle relaxant) - start with half (cut) to one whole pill at night for muscle relaxant to help you rest and help with the muscle and shoulder pain - may make you sedated or sleepy (be careful driving)  Sleep Hygiene Recommendations to promote healthy sleep in all patients, especially if symptoms of insomnia are worsening. Due to the nature of sleep rhythms, if your body gets "out of rhythm", it may take some time before your sleep cycle can be "reset".  Please try to follow as many of the following tips as you can, usually there are only a few of these are the primary cause of the problem.  ?To reset your sleep rhythm, go to bed and get up at the same time every day ?Sleep only long enough to feel rested and then get out of bed ?Do not try to force yourself to sleep. If you can't sleep, get out of bed and try again later. ?Avoid naps during the day, unless excessively tired. The more sleeping during the day, then the less sleep your body needs at night.  ?Have coffee, tea, and other foods that have caffeine only in the morning ?Exercise several days a week, but not right before bed ?If you drink alcohol, prefer to have appropriate drink with one meal, but prefer to avoid alcohol in the evening, and bedtime ?If you smoke, avoid smoking, especially in the evening  ?Avoid watching TV or looking at phones, computers, or reading devices ("e-books") that give off light at least 30 minutes before bed. This  artificial light sends "awake signals" to your brain and can make it harder to fall asleep. ?Make your bedroom a comfortable place where it is easy to fall asleep: ? Put up shades or special blackout curtains to block light from outside. ? Use a white noise machine to block noise. ? Keep the temperature cool. ?Try your best to solve or at least address your problems before you go to bed ?Use relaxation techniques to manage stress. Ask your health care provider to suggest some techniques that may work well for you. These may include: ? Breathing exercises. ? Routines to release muscle tension. ? Visualizing peaceful scenes.  Please schedule a Follow-up Appointment to: Return in about 6 weeks (around 01/18/2017) for GERD, Insomnia.  If you have any other questions or concerns, please feel free to call the clinic or send a message through [place]. You may also schedule an earlier appointment if necessary.  Additionally, you may be receiving a survey about your experience at our clinic within a few days to 1 week by e-mail or mail. We value your feedback.  [name], DO [place]

## 2016-12-07 NOTE — Assessment & Plan Note (Signed)
Secondary to chronic pain with L shoulder and R chest wall See note for background - Start new Flexeril 5-10mg  nightly PRN for help pain and improve sleep - Counseling on sleep hygiene as well - May continue Melatonin, but seems not helping - Follow-up 4-6 weeks may review insomnia again as this was not primary focus of visit today

## 2016-12-07 NOTE — Assessment & Plan Note (Signed)
Stable chronic GERD, with history of PUD / gastritis, previously now seems resolved and healed Still on PPI down to daily Followed by [facility] now, thought symptoms frequent throat irritation/mucus spit up were from chronic sinus drainage  Plan: 1. Per patient request agree to switch Nexium 40mg  daily to rx Protonix 40mg  daily instead - counseling given new rx 2. Refill Flonase for 2 sprays each nare daily for 4-6 weeks or indefinitely to help control chronic sinus drainage 3. Follow-up as planned with [facility] within 4 weeks
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
## 2016-12-08 ENCOUNTER — Other Ambulatory Visit
Admission: RE | Admit: 2016-12-08 | Discharge: 2016-12-08 | Disposition: A | Discharge status: Home / Self Care | Payer: Medicare Other | Source: Ambulatory Visit | Attending: Gastroenterology | Admitting: Gastroenterology

## 2016-12-08 DIAGNOSIS — K279 Peptic ulcer, site unspecified, unspecified as acute or chronic, without hemorrhage or perforation: Secondary | ICD-10-CM | POA: Insufficient documentation

## 2016-12-09 LAB — H. PYLORI ANTIBODY, IGG: H Pylori IgG: <0.8 Index Value (ref 0.00–0.79)

## 2016-12-28 ENCOUNTER — Ambulatory Visit (INDEPENDENT_AMBULATORY_CARE_PROVIDER_SITE_OTHER): Payer: Medicare Other | Admitting: Gastroenterology

## 2016-12-28 VITALS — BP 130/66 | HR 86 | Ht 62.0 in | Wt 141.4 lb

## 2016-12-28 DIAGNOSIS — Z8709 Personal history of other diseases of the respiratory system: Secondary | ICD-10-CM

## 2016-12-28 DIAGNOSIS — Z87898 Personal history of other specified conditions: Secondary | ICD-10-CM

## 2016-12-28 NOTE — Progress Notes (Signed)
Cephas Darby, MD [address]  [place]  [place], [place] [address]  Main: [phone]  Fax: [phone]    Gastroenterology Consultation  Referring Provider:     [name] Primary Care Physician:  Olin Hauser, DO Primary Gastroenterologist:  Dr. Cephas Darby Reason for Consultation:     Spitting up mucus        HPI:   Elizabeth Bailey is a 76 y.o. y/o female referred by Dr. Parks Ranger, Devonne Doughty, DO  for consultation & management of spitting up mucus.  She was recently diagnosed with right breast cancer stage I ER/PR positive invasive mammary carcinoma, underwent wide excision and radiation treatment. She is doing well from that standpoint. She was previously seen by my GI colleagues, Dr. Vicente Males and Dr. Allen Norris. She had a complicated hospital course in December/2017 when she was admitted with abdominal pain, CT revealed possible perforated duodenal ulcer, at that time underwent ex-lap and was found to have questionable mass in the pancreas underwent biopsy which was benign tissue. She subsequently underwent EGD/EUS and was incidentally found to have 44mm duodenal ulcer. And, EUS revealed normal pancreas with minimal inflammation. IgG4 levels were also normal. She had repeat EGD in 04/2016 which confirmed healing of the duodenal ulcer. Interestingly gastric biopsies revealed acute gastritis. H. pylori was not detected. Reports that she was taking Aleve twice daily prior to this hospitalization.  Currently, she has been experiencing frequent spitting up of mucus coming from throat both day and night. This started when she was hospitalized in 01/2016 and persisted. She is on Nexium 2 times daily before breakfast and at bedtime. She denies heartburn, regurgitation, acid taste in mouth, epigastric pain, chest pain. She reports having sinus issues and has been taking Singulair and Symbicort. She is also on Humira for rheumatoid arthritis. She gained weight in last  6 months.  Follow up visit 12/28/16: Her symptoms have resolved after treating her sinuses with nasal spray. She is currently on protonix 40mg  daily and denies any upper GI symptoms. Her H Pylori IgG came back negative  GI Procedures:  EGD 05/05/16 - Small hiatal hernia. - Gastritis. Biopsied. - No gross lesions in the entire examined duodenum.  DIAGNOSIS:  A. STOMACH; COLD BIOPSY:  - MILD CHRONIC ACTIVE GASTRITIS.  - NEGATIVE FOR DYSPLASIA AND MALIGNANCY.  ADDENDUM:  Immunostain for H. pylori is negative with a working control.    Biopsy of the pancreas head mass on ex-lap DIAGNOSIS:  A. PANCREATIC HEAD MASS; BIOPSY:  - FEATURES SUGGESTIVE OF RUPTURED INFLAMED CYSTIC STRUCTURE WITH  REACTIVE AND ATROPHIC PANCREATIC PARENCHYMAL CHANGE.  Comment:  A duodenal diverticulum was identified. The findings in this biopsy  could be compatible with a ruptured cyst or diverticulum.   EGD/EUS 03/12/2016 EGD Impressions: - Mild Schatzki ring. - Medium-sized hiatal hernia. - Normal stomach. Biopsied to exclude H. pylori. - One duodenal ulcer. Biopsied. EUS Impressions: - Pancreatic parenchymal abnormalities consisting of hyperechoic strands and a heterogeneous appearance were noted in the pancreatic head. No obvious mass lesion seen. Suspect mild inflammatory changes. - There was otherwise no sign of significant pathology in the entire pancreas. - There was no sign of significant pathology in the common bile duct and in the common hepatic duct. - Normal visualized portions of the liver. - Normal celiac region. DIAGNOSIS:  A. DUODENUM ULCER; COLD BIOPSY:  - MODERATE CHRONIC ACTIVE DUODENITIS WITH CHANGES COMPATIBLE WITH  PROVIDED INFORMATION OF DUODENAL ULCER.  - NEGATIVE FOR PARASITES, VIRAL  CYTOPATHIC EFFECT, DYSPLASIA, AND  MALIGNANCY.   B. STOMACH; COLD BIOPSY:  - ANTRAL AND OXYNTIC MUCOSA WITH MODERATE CHRONIC NONSPECIFIC GASTRITIS.  - NEGATIVE FOR H. PYLORI, DYSPLASIA, AND  MALIGNANCY.  Past Medical History:  Diagnosis Date  . Arthritis   . Asthma   . Cancer ([facility]) 03/27/2016   Invasive mammary carcinoma right breast Stage 1 ER/PR positive  . Chronic bronchitis ([place])   . Collagen vascular disease ([place])   . Depression   . Diverticulosis   . GERD (gastroesophageal reflux disease)   . High cholesterol   . Hypertension   . Seasonal allergies     Past Surgical History:  Procedure Laterality Date  . ABDOMINAL HYSTERECTOMY    . BREAST BIOPSY  1970's  . BREAST BIOPSY Right 03/27/2016   INVASIVE MAMMARY CARCINOMA  . BREAST LUMPECTOMY WITH SENTINEL LYMPH NODE BIOPSY Right 04/09/2016   Procedure: BREAST LUMPECTOMY WITH SENTINEL LYMPH NODE BX;  Surgeon: Christene Lye, MD;  Location: [facility] ORS;  Service: General;  Laterality: Right;  . CAROTID ENDARTERECTOMY Left   . CHOLECYSTECTOMY    . COLONOSCOPY  2005  . DG  BONE DENSITY ([place] [facility])  2010  . DIAGNOSTIC MAMMOGRAM  2015  . ESOPHAGOGASTRODUODENOSCOPY (EGD) WITH PROPOFOL N/A 05/05/2016   Procedure: ESOPHAGOGASTRODUODENOSCOPY (EGD) WITH PROPOFOL;  Surgeon: Lucilla Lame, MD;  Location: [facility] ENDOSCOPY;  Service: Endoscopy;  Laterality: N/A;  . EUS N/A 03/12/2016   Procedure: FULL UPPER ENDOSCOPIC ULTRASOUND (EUS) RADIAL;  Surgeon: Holly Bodily, MD;  Location: [facility] ENDOSCOPY;  Service: Endoscopy;  Laterality: N/A;  . GALLBLADDER SURGERY    . PANCREAS SURGERY  01/24/2016   Dr Burt Knack  . pap smear  2014  . REPAIR OF PERFORATED ULCER N/A 01/24/2016   Procedure: Exploratory Laparotomy with biopsy of Pancreas;  Surgeon: Florene Glen, MD;  Location: [facility] ORS;  Service: General;  Laterality: N/A;  . UPPER GI ENDOSCOPY  03/12/2016   Dr Mont Dutton    Prior to Admission medications   Medication Sig Start Date End Date Taking? Authorizing Provider  acetaminophen (TYLENOL) 325 MG tablet Take 650 mg by mouth every 6 (six) hours as needed for mild pain.    [provider]  Adalimumab (HUMIRA PEN) 40  MG/0.8ML PNKT Inject 40 mg into the skin every 21 ( twenty-one) days. TAKES EVERY OTHER WEEK  LAST DOSE WAS IN 01-2016 12/06/14   [provider]  albuterol (PROVENTIL HFA;VENTOLIN HFA) 108 (90 Base) MCG/ACT inhaler Inhale 2 puffs into the lungs every 6 (six) hours as needed for wheezing or shortness of breath.    [provider]  bisacodyl (DULCOLAX) 10 MG suppository Place 1 suppository (10 mg total) rectally daily as needed for moderate constipation. Patient not taking: Reported on 10/14/2016 01/31/16   Olean Ree, MD  budesonide-formoterol [facility]) 160-4.5 MCG/ACT inhaler Inhale 1 puff into the lungs 2 (two) times daily. 12/05/15   Arlis Porta., MD  diphenhydramine-acetaminophen (TYLENOL PM) 25-500 MG TABS tablet Take 1-2 tablets by mouth at bedtime as needed (SLEEP). 1-2 TABLETS DEPENDING ON DIFFICULTLY SLEEPING    [provider]  esomeprazole (NEXIUM) 40 MG capsule Take 1 capsule (40 mg total) by mouth 2 (two) times daily before a meal. 03/17/16   Luan Pulling, Ronelle Nigh., MD  feeding supplement (BOOST / RESOURCE BREEZE) LIQD Take 1 Container by mouth 3 (three) times daily between meals. 01/31/16   Olean Ree, MD  fluticasone (FLONASE) 50 MCG/ACT nasal spray Place 2 sprays into the nose daily  as needed for allergies or rhinitis.     [provider]  hydrochlorothiazide (HYDRODIURIL) 25 MG tablet Take 1 tablet (25 mg total) by mouth daily. 10/14/16   Mikey College, NP  HYDROcodone-acetaminophen (NORCO/VICODIN) 5-325 MG tablet Take 1 tablet by mouth at bedtime as needed for moderate pain.  11/28/15   [provider]  letrozole (FEMARA) 2.5 MG tablet Take 1 tablet (2.5 mg total) by mouth daily. 06/23/16   Christene Lye, MD  losartan (COZAAR) 50 MG tablet Take 1 tablet (50 mg total) by mouth daily. 04/06/16   Arlis Porta., MD  Melatonin 10 MG TABS Take 2 tablets by mouth at bedtime. Reported on 06/03/2015    [provider]  montelukast (SINGULAIR) 10 MG tablet Take 1 tablet (10 mg total) by mouth daily. 11/03/16   Karamalegos, Alexander J, DO  Omega 3 1000 MG CAPS Take 1,000 mg by mouth daily. omega-3 fatty acids-vitamin E (FISH OIL) 1,000 mg    [provider]  ondansetron (ZOFRAN) 4 MG tablet Take 1 tablet (4 mg total) by mouth every 6 (six) hours as needed for nausea. Patient not taking: Reported on 10/14/2016 09/01/16   Olin Hauser, DO  simvastatin (ZOCOR) 20 MG tablet Take 1 tablet every other night. Patient taking differently: Take 20 mg by mouth every other day. Take 1 tablet every night. 01/09/15   Arlis Porta., MD  sucralfate (CARAFATE) 1 g tablet Take 1 tablet (1 g total) by mouth 4 (four) times daily -  with meals and at bedtime. 09/01/16   Karamalegos, Devonne Doughty, DO  traMADol (ULTRAM) 50 MG tablet Take 1 tablet (50 mg total) by mouth every 6 (six) hours as needed. 10/19/16   Mikey College, NP    Family History  Problem Relation Age of Onset  . Pneumonia Mother   . Depression Mother   . Depression Sister   . Breast cancer Sister 37  . Diabetes Sister   . Stroke Brother   . Heart disease Brother      Social History  Substance Use Topics  . Smoking status: Former Smoker    Packs/day: 0.50    Years: 10.00    Types: Cigarettes    Quit date: 1988  . Smokeless tobacco: Never Used     Comment: quit 1988  . Alcohol use No     Comment: wine twice a month     Allergies as of 12/28/2016 - Review Complete 12/28/2016  Allergen Reaction Noted  . Flucytosine Other (See Comments) 11/28/2014  . Fluticasone-salmeterol Other (See Comments) 03/28/2015  . Naproxen Nausea Only 11/28/2014  . Other Other (See Comments) 11/28/2014    Review of Systems:    All systems reviewed and negative except where noted in HPI.   Physical Exam:  BP 130/66   Pulse 86   Ht 5\' 2"  (1.575 m)   Wt 141 lb 6.4 oz (64.1 kg)   BMI 25.86 kg/m  No LMP recorded. Patient has had a  hysterectomy.  General:   Alert,  Well-developed, well-nourished, pleasant and cooperative in NAD Head:  Normocephalic and atraumatic. Eyes:  Sclera clear, no icterus.   Conjunctiva pink. Ears:  Normal auditory acuity. Nose:  No deformity, discharge, or lesions. Mouth:  No deformity or lesions,oropharynx pink & moist. Neck:  Supple; no masses or thyromegaly. Lungs:  Respirations even and unlabored.  Clear throughout to auscultation.   No wheezes, crackles, or rhonchi. No acute distress. Heart:  Regular rate and rhythm; no murmurs, clicks, rubs, or gallops. Abdomen:  Normal bowel sounds.  No bruits.  Soft, non-tender and non-distended without masses, hepatosplenomegaly or hernias noted.  No guarding or rebound tenderness.   Rectal: Nor performed Msk:  Symmetrical without gross deformities. Good, equal movement & strength bilaterally. Pulses:  Normal pulses noted. Extremities:  No clubbing or edema.  No cyanosis. Neurologic:  Alert and oriented x3;  grossly normal neurologically. Skin:  Intact without significant lesions or rashes. No jaundice. Psych:  Alert and cooperative. Normal mood and affect.  Imaging Studies: CT and MRCP were reviewed from 01/2016  Assessment and Plan:   GLORIANNE PROCTOR is a 76 y.o. female with recent diagnosis of right breast cancer s/p wide excision and radiation, history of rheumatoid arthritis on Humira, history of duodenal ulcer likely secondary to NSAID use, confirmed healing on repeat EGD.  H. pylori IgG negative.  She was initially seen by me for about 9 months history of spitting up mucus from throat despite being on Nexium twice daily, which is currently resolved after treating her chronic sinus issues that lead to postnasal drip.  She does not have symptoms of GERD. Therefore, I recommend her to stop Protonix and switch to H2 blocker to prevent rebound reflux and gradually wean off.    Follow up as needed  Cephas Darby, MD

## 2017-01-07 LAB — FECAL OCCULT BLOOD, GUAIAC: Fecal Occult Blood: POSITIVE

## 2017-01-11 ENCOUNTER — Encounter: Payer: Self-pay | Admitting: Family Medicine

## 2017-01-11 ENCOUNTER — Other Ambulatory Visit: Payer: Self-pay | Admitting: Family Medicine

## 2017-01-11 ENCOUNTER — Ambulatory Visit: Payer: Medicare Other | Admitting: Family Medicine

## 2017-01-11 VITALS — BP 120/84 | HR 92 | Temp 98.3°F | Resp 16 | Ht 62.0 in | Wt 142.6 lb

## 2017-01-11 DIAGNOSIS — R7309 Other abnormal glucose: Secondary | ICD-10-CM

## 2017-01-11 DIAGNOSIS — M0579 Rheumatoid arthritis with rheumatoid factor of multiple sites without organ or systems involvement: Secondary | ICD-10-CM | POA: Diagnosis not present

## 2017-01-11 DIAGNOSIS — E871 Hypo-osmolality and hyponatremia: Secondary | ICD-10-CM

## 2017-01-11 DIAGNOSIS — I10 Essential (primary) hypertension: Secondary | ICD-10-CM

## 2017-01-11 DIAGNOSIS — F5101 Primary insomnia: Secondary | ICD-10-CM

## 2017-01-11 DIAGNOSIS — K219 Gastro-esophageal reflux disease without esophagitis: Secondary | ICD-10-CM

## 2017-01-11 DIAGNOSIS — R635 Abnormal weight gain: Secondary | ICD-10-CM

## 2017-01-11 DIAGNOSIS — R799 Abnormal finding of blood chemistry, unspecified: Secondary | ICD-10-CM

## 2017-01-11 DIAGNOSIS — E7849 Other hyperlipidemia: Secondary | ICD-10-CM

## 2017-01-11 MED ORDER — TRAZODONE HCL 50 MG PO TABS: 50.0000 mg | ORAL_TABLET | Freq: Every day | ORAL | 5 refills | Status: DC

## 2017-01-11 MED ORDER — RANITIDINE HCL 150 MG PO TABS: 150.0000 mg | ORAL_TABLET | Freq: Two times a day (BID) | ORAL | 3 refills | Status: DC

## 2017-01-11 NOTE — Assessment & Plan Note (Signed)
Stable, with some chronic pain Followed by Dr Jefm Bryant at [facility]

## 2017-01-11 NOTE — Assessment & Plan Note (Signed)
Stable chronic GERD, with history of PUD / gastritis, previously now remains resolved Followed by [facility] Dr Marius Ditch, last EGD 04/2016 Mimic symptoms were mostly caused by rhinitis and sinus drainage, now improved on flonase  Plan: 1. Still taking Protonix 40mg  daily - counseling today to follow [facility] instructions to STOP PPI and switch to H2 blocker, sent new rx Zantac 150mg  BID to pharmacy, take one twice daily with meals for several weeks to months then taper off, 2. Continue Flonse 3. Follow-up within 3 months annual + labs

## 2017-01-11 NOTE — Patient Instructions (Addendum)
Thank you for coming to the clinic today.  1.  As Dr Marius Ditch recommended, please go ahead and stop the Protonix 40mg  once daily, and START Zantac (Ranitidine) 150mg  twice daily, rx sent to pharmacy, take with meals breakfast and dinner, and take it EVERYday for 3 months then gradually taper off, may reduce to ONCE daily every other day and then off.  2. Please discuss Tramadol rx with Dr Sharlet Salina, if you are deciding to no longer take Hydrocodone  3. For sleep, try Trazodone 50mg  tablets once at bedtime 30 min before bed, for 2-3 weeks if still not effective, may increase dose to 75mg , take ONE and HALF tablets, then eventually we may try TWO pills for 100mg  nightly. - This med takes a few weeks to work, be patient with it  DUE for FASTING BLOOD WORK (no food or drink after midnight before the lab appointment, only water or coffee without cream/sugar on the morning of)  SCHEDULE "Lab Only" visit in the morning at the clinic for lab draw in 3 MONTHS  - Make sure Lab Only appointment is at about 1 week before your next appointment, so that results will be available  For Lab Results, once available within 2-3 days of blood draw, you can can log in to MyChart online to view your results and a brief explanation. Also, we can discuss results at next follow-up visit.  Please schedule a Follow-up Appointment to: Return in about 3 months (around 04/13/2017) for Annual Physical.  If you have any other questions or concerns, please feel free to call the clinic or send a message through [facility]. You may also schedule an earlier appointment if necessary.  Additionally, you may be receiving a survey about your experience at our clinic within a few days to 1 week by e-mail or mail. We value your feedback.  [name], DO [place]

## 2017-01-11 NOTE — Progress Notes (Signed)
Subjective:    Patient ID: Elizabeth Bailey, female    DOB: [birth year], 76 y.o.   MRN: [id number]  Elizabeth Bailey is a 76 y.o. female presenting on 01/11/2017 for Gastroesophageal Reflux and Insomnia (didn't improved)   HPI   FOLLOW-UP GERD / Chronic Sinus - Last visit with me 12/07/16, for same problem after she has been followed by [facility] Dr Marius Ditch, see prior notes for background information. - Interval update with reports GERD symptoms resolved and sinus congestion improved on Flonase, now on Protonix 40mg  daily, per GI recommended switch to H2 blocker and taper off. - Today patient reports that she has not tapered off PPI, still taking Protonix 40mg  daily, states that she was "scared" to come off of it, and asking for second opinion today. - Denies any new heartburn, epigastric or abdominal pain, nausea vomiting, early satiety, dark stools  FOLLOW-UP Insomnia: - Last visit with me 12/07/16, for initial visit for same problem with me but has seen prior PCP before for this, treated with trial on Flexeril QHS PRN to see if pain was contributing to her insomnia and frequent awakenings, see prior notes for background information. - Interval update with no improvement on Flexeril, she stopped taking this. Still waking up frequently 3-5x nightly, different reasons, mostly describes difficulty falling asleep. She is no longer on Hydrocodone either, she stopped due to request of her son who is a English as a second language teacher and said it was "unsafe", and she has not returned to Dr Sharlet Salina yet to ask for Tramadol rx, she has apt in December - Today patient reports still insomnia, asking about other med options, a friend of hers recommended "Trazodone", she has never been on this, previous rx from prior PCP with Escitalopram and she did not like this one, gave her side effects - Prior history of PSG in past, reportedly these were all negative, never dx with sleep apnea, despite this on her problem list - Denies any depression, had  history of depression on her problem list but denies this, and see score PHQ2 = 0  Rheumatoid Arthritis Followed by Dr Jefm Bryant at [facility] Currently on Humira, recently see October 2018. She is interested in other options due to cost Primary source of some of her chronic pain  Health Maintenance: UTD Flu Shot  Depression screen [facility] 2/9 01/11/2017 12/07/2016 08/06/2016  Decreased Interest 0 0 0  Down, Depressed, Hopeless 0 0 0  PHQ - 2 Score 0 0 0  Difficult doing work/chores Not difficult at all Not difficult at all -    Social History   Tobacco Use  . Smoking status: Former Smoker    Packs/day: 0.50    Years: 10.00    Pack years: 5.00    Types: Cigarettes    Last attempt to quit: 1988    Years since quitting: 30.8  . Smokeless tobacco: Never Used  . Tobacco comment: quit 1988  Substance Use Topics  . Alcohol use: No    Alcohol/week: 0.0 oz    Comment: wine twice a month   . Drug use: No    Review of Systems Per HPI unless specifically indicated above     Objective:    BP 120/84 (BP Location: Left Arm, Cuff Size: Normal)   Pulse 92   Temp 98.3 F (36.8 C) (Oral)   Resp 16   Ht 5\' 2"  (1.575 m)   Wt 142 lb 9.6 oz (64.7 kg)   BMI 26.08 kg/m   Wt Readings from  Last 3 Encounters:  01/11/17 142 lb 9.6 oz (64.7 kg)  12/28/16 141 lb 6.4 oz (64.1 kg)  12/07/16 136 lb 9.6 oz (62 kg)    Physical Exam  Constitutional: She is oriented to person, place, and time. She appears well-developed and well-nourished. No distress.  Well-appearing, comfortable, cooperative  HENT:  Head: Normocephalic and atraumatic.  Mouth/Throat: Oropharynx is clear and moist.  Hard of hearing without hearing aid today  Eyes: Conjunctivae are normal. Right eye exhibits no discharge. Left eye exhibits no discharge.  Neck: Normal range of motion. Neck supple.  Cardiovascular: Normal rate, regular rhythm, normal heart sounds and intact distal pulses.  No murmur heard. Pulmonary/Chest:  Effort normal.  Musculoskeletal: Normal range of motion. She exhibits no edema.  Neurological: She is alert and oriented to person, place, and time.  Skin: Skin is warm and dry. No rash noted. She is not diaphoretic. No erythema.  Psychiatric: She has a normal mood and affect. Her behavior is normal.  Well groomed, good eye contact, normal speech and thoughts  Nursing note and vitals reviewed.  Results for orders placed or performed during the hospital encounter of 12/08/16  H. pylori antibody, IgG  Result Value Ref Range   H Pylori IgG <0.80 0.00 - 0.79 Index Value      Assessment & Plan:   Problem List Items Addressed This Visit    GERD (gastroesophageal reflux disease)    Stable chronic GERD, with history of PUD / gastritis, previously now remains resolved Followed by [facility] Dr Marius Ditch, last EGD 04/2016 Mimic symptoms were mostly caused by rhinitis and sinus drainage, now improved on flonase  Plan: 1. Still taking Protonix 40mg  daily - counseling today to follow [facility] instructions to STOP PPI and switch to H2 blocker, sent new rx Zantac 150mg  BID to pharmacy, take one twice daily with meals for several weeks to months then taper off, 2. Continue Flonse 3. Follow-up within 3 months annual + labs      Relevant Medications   ranitidine (ZANTAC) 150 MG tablet   Insomnia - Primary    Persistent problem, seem primarily secondary to pain and stress/anxiety as well without dx of depression, seems multifactorial - Failed SSRI Lexapro in past, Flexeril  Plan: 1. Start new Trazodone 50mg  nightly for insomnia - may gradual titrate dose up to 1.5 tabs 75mg  in evening if not effective after 2-3 weeks, gradual dose inc to 100mg  if need 2. Reviewed sleep hygiene 3. Follow-up within 3 months for Annual and labs, sooner if sleep is not improved - future consider other med options caution mirtazapine d/t wt gain now, may consider hyponotic agent such as a PRN Ambien      Relevant Medications    traZODone (DESYREL) 50 MG tablet   Rheumatoid arthritis involving multiple sites with positive rheumatoid factor (HCC)    Stable, with some chronic pain Followed by Dr Jefm Bryant at [facility] ordered this encounter  Medications  . ranitidine (ZANTAC) 150 MG tablet    Sig: Take 1 tablet (150 mg total) 2 (two) times daily by mouth. For up to 2-3 months, then gradually taper off    Dispense:  60 tablet    Refill:  3  . traZODone (DESYREL) 50 MG tablet    Sig: Take 1 tablet (50 mg total) at bedtime by mouth. After 2-4 weeks, can increase to 1.5 tablet (75mg ) at bedtime    Dispense:  30 tablet  Refill:  5    Follow up plan: Return in about 3 months (around 04/13/2017) for Annual Physical.  Future labs ordered for 03/2017  [name], [facility] 01/11/2017, 9:04 AM

## 2017-01-11 NOTE — Assessment & Plan Note (Signed)
Persistent problem, seem primarily secondary to pain and stress/anxiety as well without dx of depression, seems multifactorial - Failed SSRI Lexapro in past, Flexeril  Plan: 1. Start new Trazodone 50mg  nightly for insomnia - may gradual titrate dose up to 1.5 tabs 75mg  in evening if not effective after 2-3 weeks, gradual dose inc to 100mg  if need 2. Reviewed sleep hygiene 3. Follow-up within 3 months for Annual and labs, sooner if sleep is not improved - future consider other med options caution mirtazapine d/t wt gain now, may consider hyponotic agent such as a PRN Ambien

## 2017-01-19 ENCOUNTER — Ambulatory Visit
Admission: RE | Admit: 2017-01-19 | Discharge: 2017-01-19 | Disposition: A | Discharge status: Home / Self Care | Payer: Medicare Other | Source: Ambulatory Visit | Attending: General Surgery | Admitting: General Surgery

## 2017-01-19 ENCOUNTER — Encounter: Payer: Self-pay | Admitting: Family Medicine

## 2017-01-19 DIAGNOSIS — C50211 Malignant neoplasm of upper-inner quadrant of right female breast: Secondary | ICD-10-CM | POA: Diagnosis present

## 2017-01-19 DIAGNOSIS — Z170 Estrogen receptor positive status [ER+]: Principal | ICD-10-CM

## 2017-01-19 HISTORY — DX: Malignant neoplasm of unspecified site of unspecified female breast: C50.919

## 2017-01-19 HISTORY — DX: Personal history of irradiation: Z92.3

## 2017-01-28 ENCOUNTER — Ambulatory Visit: Payer: Medicare Other | Admitting: General Surgery

## 2017-02-03 ENCOUNTER — Ambulatory Visit: Payer: Medicare Other | Admitting: General Surgery

## 2017-02-03 ENCOUNTER — Encounter: Payer: Self-pay | Admitting: General Surgery

## 2017-02-03 VITALS — BP 130/68 | HR 79 | Resp 14 | Ht 63.0 in | Wt 142.0 lb

## 2017-02-03 DIAGNOSIS — C50211 Malignant neoplasm of upper-inner quadrant of right female breast: Secondary | ICD-10-CM | POA: Diagnosis not present

## 2017-02-03 DIAGNOSIS — Z170 Estrogen receptor positive status [ER+]: Secondary | ICD-10-CM | POA: Diagnosis not present

## 2017-02-03 NOTE — Progress Notes (Signed)
Patient ID: Elizabeth Bailey, female   DOB: 12-16-1940, 76 y.o.   MRN: [id number]  Chief Complaint  Patient presents with  . Follow-up    HPI Elizabeth Bailey is a 76 y.o. female who presents for a breast cancer follow-up. The most recent mammogram was done on 01/19/2017.  Patient does perform regular self breast checks and gets regular mammograms done.  Doing well on the Femara  HPI  Past Medical History:  Diagnosis Date  . Arthritis   . Asthma   . Breast cancer (Elizabeth Bailey) 2018  . 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 Chronic bronchitis (Elizabeth Bailey)   . Collagen vascular disease (Elizabeth Bailey)   . Depression   . Diverticulosis   . GERD (gastroesophageal reflux disease)   . High cholesterol   . Hypertension   . Personal history of radiation therapy   . Seasonal allergies     Past Surgical History:  Procedure Laterality Date  . ABDOMINAL HYSTERECTOMY    . BREAST BIOPSY  1970's  . BREAST BIOPSY Right 03/27/2016   INVASIVE MAMMARY CARCINOMA  . BREAST LUMPECTOMY Right 04/09/2016   radiation  . BREAST LUMPECTOMY WITH SENTINEL LYMPH NODE BIOPSY Right 04/09/2016   Procedure: BREAST LUMPECTOMY WITH SENTINEL LYMPH NODE BX;  Surgeon: Christene Lye, MD;  Location: [facility];  Service: General;  Laterality: Right;  . CAROTID ENDARTERECTOMY Left   . CHOLECYSTECTOMY    . COLONOSCOPY  2005  . DG  BONE DENSITY (Elizabeth Bailey)  2010  . DIAGNOSTIC MAMMOGRAM  2015  . ESOPHAGOGASTRODUODENOSCOPY (EGD) WITH PROPOFOL N/A 05/05/2016   Procedure: ESOPHAGOGASTRODUODENOSCOPY (EGD) WITH PROPOFOL;  Surgeon: Lucilla Lame, MD;  Location: [facility];  Service: Endoscopy;  Laterality: N/A;  . EUS N/A 03/12/2016   Procedure: FULL UPPER ENDOSCOPIC ULTRASOUND (EUS) RADIAL;  Surgeon: Holly Bodily, MD;  Location: [facility];  Service: Endoscopy;  Laterality: N/A;  . GALLBLADDER SURGERY    . PANCREAS SURGERY  01/24/2016   Dr Burt Knack  . pap smear  2014  . REPAIR  OF PERFORATED ULCER N/A 01/24/2016   Procedure: Exploratory Laparotomy with biopsy of Pancreas;  Surgeon: Florene Glen, MD;  Location: [facility];  Service: General;  Laterality: N/A;  . UPPER GI ENDOSCOPY  03/12/2016   Dr Mont Dutton    Family History  Problem Relation Age of Onset  . Pneumonia Mother   . Depression Mother   . Depression Sister   . Breast cancer Sister 97  . Diabetes Sister   . Stroke Brother   . Heart disease Brother     Social History Social History   Tobacco Use  . Smoking status: Former Smoker    Packs/day: 0.50    Years: 10.00    Pack years: 5.00    Types: Cigarettes    Last attempt to quit: 1988    Years since quitting: 30.9  . Smokeless tobacco: Never Used  . Tobacco comment: quit 1988  Substance Use Topics  . Alcohol use: No    Alcohol/week: 0.0 oz    Comment: wine twice a month   . Drug use: No    Allergies  Allergen Reactions  . Flucytosine Other (See Comments)    Sore mouth Sore mouth  . Fluticasone-Salmeterol Other (See Comments)    Other reaction(s): Other (See Comments) Sore mouth  . Naproxen Nausea Only    GI upset  . Other Other (See Comments)    Frequent urination Other  reaction(s): Other (See Comments) Frequent urination Other reaction(s): Other (See Comments) Frequent urination Frequent urination    Current Outpatient Medications  Medication Sig Dispense Refill  . acetaminophen (TYLENOL) 325 MG tablet Take 650 mg by mouth every 6 (six) hours as needed for mild pain.    . Adalimumab (HUMIRA PEN) 40 MG/0.8ML PNKT Inject 40 mg into the skin every 21 ( twenty-one) days. TAKES EVERY OTHER WEEK  LAST DOSE WAS IN 01-2016    . albuterol (PROVENTIL HFA;VENTOLIN HFA) 108 (90 Base) MCG/ACT inhaler Inhale 2 puffs into the lungs every 6 (six) hours as needed for wheezing or shortness of breath.    . ALBUTEROL IN Inhale 2 Inhalers into the lungs every 6 (six) hours as needed.    Marland Kitchen amLODipine (NORVASC) 5 MG tablet Take by mouth.     . budesonide-formoterol (SYMBICORT) 160-4.5 MCG/ACT inhaler Inhale 1 puff into the lungs 2 (two) times daily. 1 Inhaler 12  . calcium-vitamin D (OSCAL WITH D) 500-200 MG-UNIT TABS tablet Take by mouth.    . diphenhydramine-acetaminophen (TYLENOL PM) 25-500 MG TABS tablet Take 1-2 tablets by mouth at bedtime as needed (SLEEP). 1-2 TABLETS DEPENDING ON DIFFICULTLY SLEEPING    . feeding supplement (BOOST / RESOURCE BREEZE) LIQD Take 1 Container by mouth 3 (three) times daily between meals.  0  . fluticasone (FLONASE) 50 MCG/ACT nasal spray Place 2 sprays into both nostrils daily. Use for 4-6 weeks then stop and use seasonally or as needed. 16 g 3  . folic acid (FOLVITE) 1 MG tablet Take by mouth.    . gabapentin (NEURONTIN) 300 MG capsule 1 qAM, 1 qLunch, 1 qDinner, and 2 qHS    . hydrochlorothiazide (HYDRODIURIL) 25 MG tablet Take 1 tablet (25 mg total) by mouth daily. 30 tablet 5  . letrozole (FEMARA) 2.5 MG tablet Take 1 tablet (2.5 mg total) by mouth daily. 90 tablet 3  . losartan (COZAAR) 50 MG tablet Take 1 tablet (50 mg total) by mouth daily. 90 tablet 3  . losartan-hydrochlorothiazide (HYZAAR) 100-25 MG tablet Take by mouth.    . Melatonin 10 MG TABS Take 2 tablets by mouth at bedtime. Reported on 06/03/2015    . montelukast (SINGULAIR) 10 MG tablet Take 1 tablet (10 mg total) by mouth daily. 30 tablet 5  . Multiple Vitamin (MULTI-VITAMINS) TABS Take by mouth.    . Omega 3 1000 MG CAPS Take 1,000 mg by mouth daily. omega-3 fatty acids-vitamin E (Elizabeth OIL) 1,000 mg    . omega-3 acid ethyl esters (LOVAZA) 1 g capsule Take by mouth.    . ranitidine (ZANTAC) 150 MG tablet Take 1 tablet (150 mg total) 2 (two) times daily by mouth. For up to 2-3 months, then gradually taper off 60 tablet 3  . simvastatin (ZOCOR) 20 MG tablet Take 1 tablet every other night. (Patient taking differently: Take 20 mg by mouth every other day. Take 1 tablet every night.) 30 tablet 12  . traZODone (DESYREL) 50 MG tablet  Take 1 tablet (50 mg total) at bedtime by mouth. After 2-4 weeks, can increase to 1.5 tablet (58m) at bedtime 30 tablet 5   No current facility-administered medications for this visit.     Review of Systems Review of Systems  Constitutional: Negative.   Respiratory: Negative.   Cardiovascular: Negative.     Blood pressure 130/68, pulse 79, resp. rate 14, height _0  (1.6 m), weight 142 lb (64.4 kg).  Physical Exam Physical Exam  Constitutional: She is oriented  to person, place, and time. She appears well-developed and well-nourished.  Eyes: Conjunctivae are normal. No scleral icterus.  Neck: Neck supple.  Cardiovascular: Normal rate, regular rhythm and normal heart sounds.  Pulmonary/Chest: Effort normal and breath sounds normal. Right breast exhibits skin change ( ). Right breast exhibits no inverted nipple, no mass, no nipple discharge and no tenderness. Left breast exhibits no inverted nipple, no mass, no nipple discharge, no skin change and no tenderness.  Abdominal: Soft. Bowel sounds are normal. There is no hepatomegaly. There is no tenderness.  Lymphadenopathy:    She has no cervical adenopathy.    She has no axillary adenopathy.  Neurological: She is alert and oriented to person, place, and time.  Skin: Skin is warm and dry.    Data Reviewed Mammogram reviewed   Assessment    Invasive mammary carcinoma T1c,N0. Stage 1 ER/PR positive Her2 equivocal(hermark negative) Mamma print showed low risk Patient underwent lumpectomy and sentinel node biopsy and radiation thereafter in early part of this year. Patient is currently on let letrozole and doing very well.     Plan    Ca27-29 order. The patient has been asked to return to the office in one year with a bilateral diagnostic mammogram with Dr. Bary Castilla.    HPI, Physical Exam, Assessment and Plan have been scribed under the direction and in the presence of Mckinley Jewel, MD  Gaspar Cola, CMA  I have completed  the exam and reviewed the above documentation for accuracy and completeness.  I agree with the above.  Haematologist has been used and any errors in dictation or transcription are unintentional.  Maleeka Sabatino G. Jamal Collin, M.D., F.A.C.S.   Junie Panning G 02/12/2017, 10:38 AM

## 2017-02-03 NOTE — Patient Instructions (Signed)
Ca27-29 order. The patient has been asked to return to the office in one year with a bilateral diagnostic mammogram with Dr. Bary Castilla.

## 2017-02-11 ENCOUNTER — Ambulatory Visit
Admission: RE | Admit: 2017-02-11 | Discharge: 2017-02-11 | Disposition: A | Discharge status: Home / Self Care | Payer: Medicare Other | Source: Ambulatory Visit | Attending: Radiation Oncology | Admitting: Radiation Oncology

## 2017-02-11 ENCOUNTER — Encounter: Payer: Self-pay | Admitting: Radiation Oncology

## 2017-02-11 ENCOUNTER — Other Ambulatory Visit: Payer: Self-pay

## 2017-02-11 VITALS — BP 161/74 | HR 71 | Temp 96.5°F | Resp 20 | Wt 145.1 lb

## 2017-02-11 DIAGNOSIS — Z170 Estrogen receptor positive status [ER+]: Secondary | ICD-10-CM | POA: Insufficient documentation

## 2017-02-11 DIAGNOSIS — Z923 Personal history of irradiation: Secondary | ICD-10-CM | POA: Diagnosis not present

## 2017-02-11 DIAGNOSIS — Z79811 Long term (current) use of aromatase inhibitors: Secondary | ICD-10-CM | POA: Diagnosis not present

## 2017-02-11 DIAGNOSIS — C50211 Malignant neoplasm of upper-inner quadrant of right female breast: Secondary | ICD-10-CM | POA: Insufficient documentation

## 2017-02-11 NOTE — Progress Notes (Signed)
Radiation Oncology Follow up Note  Name: Elizabeth Bailey   Date:   02/11/2017 MRN:  [id number] DOB: 12/19/1940    This 76 y.o. female presents to the clinic today for six-month follow-up status post whole breast radiation to her right breast for stage I ER/PR positive invasive mammary carcinoma.  REFERRING PROVIDER: Arlis Porta., MD  HPI: Patient is a 76 year old female now out 6 months having completed whole breast radiation to her right breast for stage I ER/PR positive invasive mammary carcinoma. She seen today in routine follow-up and is doing well. She specifically denies breast tenderness cough or bone pain. She is currently on Femara tolerating that well without side effect.. She recently had mammograms which I have reviewed which were BI-RADS 2 benign.  COMPLICATIONS OF TREATMENT: none  FOLLOW UP COMPLIANCE: keeps appointments   PHYSICAL EXAM:  BP (!) 161/74   Pulse 71   Temp (!) 96.5 F (35.8 C)   Resp 20   Wt 145 lb 1 oz (65.8 kg)   BMI 25.70 kg/m  Lungs are clear to A&P cardiac examination essentially unremarkable with regular rate and rhythm. No dominant mass or nodularity is noted in either breast in 2 positions examined. Incision is well-healed. No axillary or supraclavicular adenopathy is appreciated. Cosmetic result is excellent. Well-developed well-nourished patient in NAD. HEENT reveals PERLA, EOMI, discs not visualized.  Oral cavity is clear. No oral mucosal lesions are identified. Neck is clear without evidence of cervical or supraclavicular adenopathy. Lungs are clear to A&P. Cardiac examination is essentially unremarkable with regular rate and rhythm without murmur rub or thrill. Abdomen is benign with no organomegaly or masses noted. Motor sensory and DTR levels are equal and symmetric in the upper and lower extremities. Cranial nerves II through XII are grossly intact. Proprioception is intact. No peripheral adenopathy or edema is identified. No motor or  sensory levels are noted. Crude visual fields are within normal range.  RADIOLOGY RESULTS: Mammograms reviewed and compatible with the above-stated findings  PLAN: At the present time patient is doing well with no evidence of disease 6 months out. I've asked to see her back in 6 months for follow-up. I have reviewed her mammograms. She continues on Femara without side effect. She knows to call with any concerns.  I would like to take this opportunity to thank you for allowing me to participate in the care of your patient.Noreene Filbert, MD

## 2017-02-15 ENCOUNTER — Telehealth: Payer: Self-pay | Admitting: Family Medicine

## 2017-02-15 ENCOUNTER — Ambulatory Visit: Payer: Medicare Other | Admitting: Family Medicine

## 2017-02-15 ENCOUNTER — Encounter: Payer: Self-pay | Admitting: Family Medicine

## 2017-02-15 VITALS — BP 126/70 | HR 70 | Temp 97.5°F | Resp 16 | Ht 63.0 in | Wt 144.0 lb

## 2017-02-15 DIAGNOSIS — S0511XA Contusion of eyeball and orbital tissues, right eye, initial encounter: Secondary | ICD-10-CM

## 2017-02-15 DIAGNOSIS — M0579 Rheumatoid arthritis with rheumatoid factor of multiple sites without organ or systems involvement: Secondary | ICD-10-CM

## 2017-02-15 MED ORDER — GABAPENTIN 300 MG PO CAPS: ORAL_CAPSULE | ORAL | 5 refills | Status: DC

## 2017-02-15 NOTE — Progress Notes (Signed)
Subjective:    Patient ID: Elizabeth Bailey, female    DOB: [birth year], 76 y.o.   MRN: [id number]  Elizabeth Bailey is a 76 y.o. female presenting on 02/15/2017 for Fall (pt don't remember but had fell out of her bed yesterday at night has some bruises on Right side near eye.)  Patient presents for a same day appointment. She called her Eye Doctor (Dr Marvel Plan, at [facility] in [place]) earlier today to get an appointment, and they wanted her to be checked out at our office first, and stated that the soonest they could see her would be after 1:00pm today.  HPI   RIGHT Eye Bruising / Contusion: - Reports new problem with Right eye bruising, first noticed this in the morning today when she got up and went to bathroom saw this in mirror. She states that she does not recall injuring her face/eye. She denies any trauma, injury. She is able to recall that possibly she had a fall or injury overnight when got up in middle of night to use bathroom, but she went back to bed and has difficulty recalling this. - Current symptoms has some slight streaking scratch marks on side of face on this side as well that are mildly sore, the bruising around her eye is slightly tender to touch only otherwise no pain at rest - She did not take any medicine for it. She tried topical heat with wash cloth only. No ice packs. - She does not take blood thinner except, anti platelet with Aspirin 81mg  daily - She states vision seems a "little foggy" but no loss of vision or flashes of color or bright flashes - She has never had anything similar to this before - Denies any concern for her safety regarding any potential abuse or violence - Denies any other injury or bruising or pain - Denies any problems with confusion today, numbness weakness tingling, speech difficulty   Depression screen [facility] 2/9 02/11/2017 01/11/2017 12/07/2016  Decreased Interest 0 0 0  Down, Depressed, Hopeless 0 0 0  PHQ - 2 Score 0 0 0    Difficult doing work/chores - Not difficult at all Not difficult at all    Social History   Tobacco Use  . Smoking status: Former Smoker    Packs/day: 0.50    Years: 10.00    Pack years: 5.00    Types: Cigarettes    Last attempt to quit: 1988    Years since quitting: 30.9  . Smokeless tobacco: Never Used  . Tobacco comment: quit 1988  Substance Use Topics  . Alcohol use: No    Alcohol/week: 0.0 oz    Comment: wine twice a month   . Drug use: No    Review of Systems Per HPI unless specifically indicated above     Objective:    BP 126/70   Pulse 70   Temp (!) 97.5 F (36.4 C)   Resp 16   Ht 5\' 3"  (1.6 m)   Wt 144 lb (65.3 kg)   BMI 25.51 kg/m   Wt Readings from Last 3 Encounters:  02/15/17 144 lb (65.3 kg)  02/11/17 145 lb 1 oz (65.8 kg)  02/03/17 142 lb (64.4 kg)    Physical Exam  Constitutional: She is oriented to person, place, and time. She appears well-developed and well-nourished. No distress.  Well-appearing, comfortable, cooperative  HENT:  Head: Normocephalic.  Mouth/Throat: Oropharynx is clear and moist.  Traumatic R eye facial appearance with ecchymosis  Eyes: Conjunctivae are normal. Right eye exhibits no discharge. Left eye exhibits no discharge.  Right Eye - Periorbital mild edema soft non firm, mild tender, with darker ecchymosis lateral aspect of periorbital region partially extending onto upper and lower eyelids, but not causing eyelid edema. - Conjunctiva and sclera is clear Right (also left) without any injection, bleeding, or abnormality  Limited fundoscopic exam done in office without eye dilation. Without obvious deformity, only partial view of retina / vasculature able to be appreciated.  No pain with EOM, fully intact.  See note for visual acuity  Neck: Normal range of motion. Neck supple.  Cardiovascular: Normal rate, regular rhythm, normal heart sounds and intact distal pulses.  No murmur heard. Pulmonary/Chest: Effort normal and  breath sounds normal. No respiratory distress. She has no wheezes. She has no rales.  Musculoskeletal: Normal range of motion. She exhibits no edema (lower ext or other joints).  Distal muscle strength intact upper and lower extremities  Neurological: She is alert and oriented to person, place, and time. No cranial nerve deficit.  Distal sensation intact to light touch bilateral upper and lower extremities  Skin: Skin is warm and dry. No rash noted. She is not diaphoretic. No erythema.  R periorbital ecchymosis, see Eyes  Psychiatric: She has a normal mood and affect. Her behavior is normal.  Well groomed, good eye contact, normal speech and thoughts  Nursing note and vitals reviewed.   Visual Acuity:  Both - 20/30 Left - 20/30 Right (injured eye) - 20/40     Assessment & Plan:   Problem List Items Addressed This Visit    None    Visit Diagnoses    Periorbital contusion of right eye, initial encounter    -  Primary  Acute R sided lateral periorbital contusion with some ecchymosis and mild local edema, some local discomfort only on exam but not at rest. No appearance of significant periorbital edema affecting her vision or other complication on exam. Visual acuity appears to be stable in office today. - No evidence of neurological deficit. No evidence of concussion - Concern with unknown mechanism, discussed may be secondary to medication, if any recurrence or new concerns other than 1 time episode, will need to follow-up and adjust medications or consider other testing  Plan: - Most likely self limited contusion ecchymosis - Start Ice packs for swelling regularly PRN for few days - Hold Aspirin 81mg  for 2-3 days until bruising improves then may resume - Concern with exam enough especially given no exact known mechanism of injury, concern that she will need close follow-up as planned, she already has apt with her Eye Doctor - Dr Marvel Plan, keep this apt this afternoon - Reviewed  criteria when she needs to go to Hospital ED if not improved if worse swelling, pain, bruising, bleeding or eye redness, confusion, headache, vision changes - per AVS       No orders of the defined types were placed in this encounter.  Follow up plan: Return in about 3 days (around 02/18/2017), or if symptoms worsen or fail to improve, for R eye periorbital contusion.  [name], [facility] 02/15/2017, 12:12 PM

## 2017-02-15 NOTE — Telephone Encounter (Signed)
Pt needs a refill on gabapentin sent to [facility].

## 2017-02-15 NOTE — Patient Instructions (Addendum)
Thank you for coming to the clinic today.  1. Please keep your eye appointment with Dr Marvel Plan this afternoon, they will need to examine the back of your eye to make sure it is not damaged or no bleeding and vision remains good.  2. As discussed, as long as it does not worsen or slowly improves - keep using Ice Packs gently on eye several times a day. No other rx needed  IF WORSENING - increased swelling, bruising, any bleeding in eye or redness of eye, loss of vision then may need to go directly to [facility] Emergency Dept.  Please schedule a Follow-up Appointment to: Return in about 3 days (around 02/18/2017), or if symptoms worsen or fail to improve, for R eye periorbital contusion.  If you have any other questions or concerns, please feel free to call the clinic or send a message through [place]. You may also schedule an earlier appointment if necessary.  Additionally, you may be receiving a survey about your experience at our clinic within a few days to 1 week by e-mail or mail. We value your feedback.  [name], DO [place]

## 2017-02-24 ENCOUNTER — Other Ambulatory Visit: Payer: Self-pay

## 2017-02-24 DIAGNOSIS — I10 Essential (primary) hypertension: Secondary | ICD-10-CM

## 2017-02-25 MED ORDER — LOSARTAN POTASSIUM 50 MG PO TABS: 50.0000 mg | ORAL_TABLET | Freq: Every day | ORAL | 3 refills | Status: DC

## 2017-03-24 ENCOUNTER — Other Ambulatory Visit: Payer: Medicare Other

## 2017-03-24 DIAGNOSIS — E7849 Other hyperlipidemia: Secondary | ICD-10-CM

## 2017-03-24 DIAGNOSIS — R7309 Other abnormal glucose: Secondary | ICD-10-CM

## 2017-03-24 DIAGNOSIS — I10 Essential (primary) hypertension: Secondary | ICD-10-CM

## 2017-03-24 DIAGNOSIS — E871 Hypo-osmolality and hyponatremia: Secondary | ICD-10-CM

## 2017-03-24 DIAGNOSIS — R635 Abnormal weight gain: Secondary | ICD-10-CM

## 2017-03-24 DIAGNOSIS — R799 Abnormal finding of blood chemistry, unspecified: Secondary | ICD-10-CM

## 2017-03-25 LAB — CBC WITH DIFFERENTIAL/PLATELET
BASOS PCT: 0.3 %
Basophils Absolute: 27 cells/uL (ref 0–200)
EOS PCT: 4.1 %
Eosinophils Absolute: 365 cells/uL (ref 15–500)
HCT: 38 % (ref 35.0–45.0)
HEMOGLOBIN: 12.7 g/dL (ref 11.7–15.5)
Lymphs Abs: 2723 cells/uL (ref 850–3900)
MCH: 31.5 pg (ref 27.0–33.0)
MCHC: 33.4 g/dL (ref 32.0–36.0)
MCV: 94.3 fL (ref 80.0–100.0)
MPV: 9.8 fL (ref 7.5–12.5)
Monocytes Relative: 9.5 %
NEUTROS ABS: 4940 {cells}/uL (ref 1500–7800)
Neutrophils Relative %: 55.5 %
PLATELETS: 468 10*3/uL — AB (ref 140–400)
RBC: 4.03 10*6/uL (ref 3.80–5.10)
RDW: 11.7 % (ref 11.0–15.0)
TOTAL LYMPHOCYTE: 30.6 %
WBC mixed population: 846 cells/uL (ref 200–950)
WBC: 8.9 10*3/uL (ref 3.8–10.8)

## 2017-03-25 LAB — COMPLETE METABOLIC PANEL WITH GFR
AG Ratio: 1 (calc) (ref 1.0–2.5)
ALBUMIN MSPROF: 3.8 g/dL (ref 3.6–5.1)
ALT: 11 U/L (ref 6–29)
AST: 17 U/L (ref 10–35)
Alkaline phosphatase (APISO): 87 U/L (ref 33–130)
BUN / CREAT RATIO: 15 (calc) (ref 6–22)
BUN: 16 mg/dL (ref 7–25)
CALCIUM: 9.4 mg/dL (ref 8.6–10.4)
CO2: 29 mmol/L (ref 20–32)
CREATININE: 1.08 mg/dL — AB (ref 0.60–0.93)
Chloride: 95 mmol/L — ABNORMAL LOW (ref 98–110)
GFR, EST NON AFRICAN AMERICAN: 50 mL/min/{1.73_m2} — AB (ref >=60)
GFR, Est African American: 58 mL/min/{1.73_m2} — ABNORMAL LOW (ref >=60)
GLOBULIN: 3.8 g/dL — AB (ref 1.9–3.7)
Glucose, Bld: 103 mg/dL (ref 65–139)
Potassium: 4.1 mmol/L (ref 3.5–5.3)
SODIUM: 133 mmol/L — AB (ref 135–146)
TOTAL PROTEIN: 7.6 g/dL (ref 6.1–8.1)
Total Bilirubin: 0.5 mg/dL (ref 0.2–1.2)

## 2017-03-25 LAB — TSH: TSH: 2.18 m[IU]/L (ref 0.40–4.50)

## 2017-03-25 LAB — LIPID PANEL
Cholesterol: 205 mg/dL — ABNORMAL HIGH (ref <200)
HDL: 76 mg/dL (ref >50)
LDL Cholesterol (Calc): 110 mg/dL (calc) — ABNORMAL HIGH
Non-HDL Cholesterol (Calc): 129 mg/dL (calc) (ref <130)
Total CHOL/HDL Ratio: 2.7 (calc) (ref <5.0)
Triglycerides: 99 mg/dL (ref <150)

## 2017-03-25 LAB — HEMOGLOBIN A1C
EAG (MMOL/L): 6 (calc)
HEMOGLOBIN A1C: 5.4 %{Hb} (ref <5.7)
MEAN PLASMA GLUCOSE: 108 (calc)

## 2017-03-30 ENCOUNTER — Ambulatory Visit (INDEPENDENT_AMBULATORY_CARE_PROVIDER_SITE_OTHER): Payer: Medicare Other | Admitting: Family Medicine

## 2017-03-30 ENCOUNTER — Encounter: Payer: Self-pay | Admitting: Family Medicine

## 2017-03-30 ENCOUNTER — Telehealth: Payer: Self-pay | Admitting: *Deleted

## 2017-03-30 VITALS — BP 134/63 | HR 82 | Temp 98.3°F | Resp 16 | Ht 63.0 in | Wt 149.0 lb

## 2017-03-30 DIAGNOSIS — K219 Gastro-esophageal reflux disease without esophagitis: Secondary | ICD-10-CM | POA: Diagnosis not present

## 2017-03-30 DIAGNOSIS — I10 Essential (primary) hypertension: Secondary | ICD-10-CM | POA: Diagnosis not present

## 2017-03-30 DIAGNOSIS — Z0000 Encounter for general adult medical examination without abnormal findings: Secondary | ICD-10-CM

## 2017-03-30 DIAGNOSIS — E440 Moderate protein-calorie malnutrition: Secondary | ICD-10-CM | POA: Diagnosis not present

## 2017-03-30 DIAGNOSIS — F5101 Primary insomnia: Secondary | ICD-10-CM

## 2017-03-30 DIAGNOSIS — Z0001 Encounter for general adult medical examination with abnormal findings: Secondary | ICD-10-CM | POA: Diagnosis not present

## 2017-03-30 DIAGNOSIS — E782 Mixed hyperlipidemia: Secondary | ICD-10-CM

## 2017-03-30 DIAGNOSIS — E871 Hypo-osmolality and hyponatremia: Secondary | ICD-10-CM | POA: Diagnosis not present

## 2017-03-30 DIAGNOSIS — R195 Other fecal abnormalities: Secondary | ICD-10-CM

## 2017-03-30 DIAGNOSIS — J431 Panlobular emphysema: Secondary | ICD-10-CM

## 2017-03-30 DIAGNOSIS — Z170 Estrogen receptor positive status [ER+]: Secondary | ICD-10-CM | POA: Diagnosis not present

## 2017-03-30 DIAGNOSIS — M0579 Rheumatoid arthritis with rheumatoid factor of multiple sites without organ or systems involvement: Secondary | ICD-10-CM

## 2017-03-30 DIAGNOSIS — C50211 Malignant neoplasm of upper-inner quadrant of right female breast: Secondary | ICD-10-CM | POA: Diagnosis not present

## 2017-03-30 NOTE — Assessment & Plan Note (Signed)
Stable without known recurrence S/p lumpectomy, radiation, femara Followed by General Surgery Dr Jamal Collin, now after retired will see Dr Harley Alto in next 2 months Has has negative Mammogram surveillance and self exams On Femara (aromatase inhib) since 05/2016, concern with some vaginal discharge, as likely side effect - advised her to contact her General Surgeon prescribing this med to review, no change at this time, will follow-up

## 2017-03-30 NOTE — Assessment & Plan Note (Signed)
Stable without significant change Chronic problem Last BMET, trend

## 2017-03-30 NOTE — Assessment & Plan Note (Signed)
Mild improvement in interval with trazodone Persistent problem, seem primarily secondary to pain and stress/anxiety as well without dx of depression, seems multifactorial - Failed SSRI Lexapro  Plan: 1. Increase dose Trazodone trial from 50mg  nightly to 1.5 again and then up to 2 pills nightly for 100mg  for 2-3 weeks - notify office if need new rx higher dose - she did not benefit from 75 so will quickly inc to 100mg  2. Reviewed sleep hygiene again 3. Follow-up within 3 months for insomnia - can adjust dose further vs future consider other med options caution mirtazapine d/t wt gain now, may consider hyponotic agent such as a PRN Ambien

## 2017-03-30 NOTE — Assessment & Plan Note (Addendum)
Improved, now wt gain back to healthy BMI range >26 Suspect reduced appetite and reduced nutrition was 2/2 breast cancer / radiation and treatment Considered Remeron in past for sleep and possibly for nutrition/appetite - agree to hold off for now

## 2017-03-30 NOTE — Assessment & Plan Note (Signed)
Stable without exacerbation - Continues Albuterol rarely PRN - Follow-up as needed consider maintenance therapy if needed 

## 2017-03-30 NOTE — Progress Notes (Signed)
Subjective:    Patient ID: Elizabeth Bailey, female    DOB: 06/05/1940, 77 y.o.   MRN: [id number]  Elizabeth Bailey is a 77 y.o. female presenting on 03/30/2017 for Annual Exam   HPI   Here for Annual Exam and Lab Review  Specialists: General Surgery - Dr Jamal Collin [facility]) - Breast CA > now s/p retired will establish with Dr Hervey Ard Rad Onc - Dr Baruch Gouty [facility]) GI - Dr Marius Ditch ([facility]) Rheumatology / Pain - Dr Jefm Bryant, Dr Sharlet Salina [facility])  FOLLOW-UP Periorbital Contusion, R Eye / Face Last visit 02/15/17 initial visit for this, has significantly improved since then, now bruising has resolved, still has some residual soreness to touch over R cheek periorbital region, otherwise asymptomatic. No new injury or problem. No problems with her vision  FOLLOW-UP Right Breast Cancer (Invasive Mammary Carcinoma, T1c, N0, Stage 1 ER/PR positive) Followed by General Surgery Dr Jamal Collin [facility]), last seen 01/2017 in follow-up had been doing well, has had routine mammogram and self checks without recurrence or problem, reviewed her prior history she had lumpectomy and radiation therapy, then placed on Femara (Letrozole) in April 2018 by Gen Surgery for aromatase inhibitor treatment. - Today she reports still doing well but wanted to report some vaginal discharge for past >6 months without worsening or improvement, describes with darker or brown color, not similar to a yeast infection, without burning dysuria or thicker white discharge, she does not endorse vaginal bleeding either. She has not discussed this with her General Surgery at last appointment - S/p Hysterectomy and uncertain oophrectomy >73-53 years ago  FOLLOW-UP GERD / Gastritis Followed by [facility], last seen by Dr Marius Ditch in 11/2016, has had history of duodenal ulcer likely 2/2 to NSAIDs, has had EGD last done 04/2016 with healing gastritis. She was having significant mucus symptoms and thought possible  GERD worsening, but has been treated for chronic sinus with relief. She was tapered off PPI, on to H2 blocker, now reports she has come off of Zantac - Currently not on H2 or PPI at this time, doing well without flare  Rheumatoid Arthritis Followed by Dr Jefm Bryant at [facility], also with Dr Sharlet Salina at [facility] for chronic pain, she was seen recently in 11/2016 last, she was able to get Humira approved now per Rheumatology, has started this for her joints. Still has joint pain including shoulder, has now been treated with hydrocodone PRN per Dr Sharlet Salina as well - Today doing well without active joint pain or problem, tolerating meds well - She has started walking program to help joints as well, will consider water aerobics  FOLLOW-UP Insomnia: - Last visit with me 01/11/17, for same problem, she was started on Trazodone nightly to help with mood and insomnia, as she has failed SSRI Lexapro and Flexeril muscle relaxant in past for sleep, see prior notes for background information. - Today patient reports still insomnia but possibly somewhat improved on Trazodone, she took 50mg  one nightly for while some improve, she did try 1.5 tabs for 75mg  but unsure if improved - Prior history of PSG in past, reportedly these were all negative, never dx with sleep apnea, despite this on her problem list - Denies any depression, had history of depression on her problem list but denies this, and see score PHQ2 = 0 - has answered other questions although PHQ should be 0  Malnutrition Had some wt loss with cancer - she has improved appetite and doing better, wt  gain +4-5 lbs,   HYPERLIPIDEMIA / Malnutrition with Weight Loss (Improved) - Reports no concerns. Last lipid panel 03/2017 showed improved HDL and inc TC as a result, but slightly inc LDL - Currently taking Simvastatin 20mg  every other night, tolerating well without side effects or myalgias Lifestyle - Diet: No significant dietary changes recently - she was  trying to improve appetite and intake to gain back some wt she lost during cancer treatment - Exercise: increased activity with walking, and water aerobics now  COPD Reports chronic problem, without recent flare up Stable without needing to use Albuterol  HTN No new changes or concerns. Has not changed medications - Asking about Losartan recall, she received letter, has not checked with pharmacy   Health Maintenance:  Colon CA Screening: Last screening with Medicare FIT FOBT in 12/2016, POSITIVE x 2 result. Last Colonoscopy 2005 by report, do not have record. She has had EGD on 05/05/16 per Dr Allen Norris with small hiatal hernia and mild gastritis. No active bleeding. No known family history of colon CA. - Advised that we would likely need to repeat screening given positive test, it is non specific, so she could proceed with colonoscopy through GI or Gen Surgery both she is established with, or consider repeat FOBT or FIT testing through Medicare or those offices  UTD Flu Shot 11/2016 UTD PNA vaccine series   Depression screen [facility] 2/9 03/30/2017 02/11/2017 01/11/2017  Decreased Interest 0 0 0  Down, Depressed, Hopeless 0 0 0  PHQ - 2 Score 0 0 0  Altered sleeping 3 - -  Tired, decreased energy 1 - -  Change in appetite 0 - -  Feeling bad or failure about yourself  0 - -  Trouble concentrating 0 - -  Moving slowly or fidgety/restless 0 - -  Suicidal thoughts 0 - -  PHQ-9 Score 4 - -  Difficult doing work/chores Somewhat difficult - Not difficult at all    Past Medical History:  Diagnosis Date  . Arthritis   . Asthma   . Breast cancer ([place]) 2018  . Cancer ([place]) 03/27/2016   Invasive mammary carcinoma right breast Stage 1 ER/PR positive  . Chronic bronchitis ([place])   . Collagen vascular disease ([place])   . Depression   . Diverticulosis   . GERD (gastroesophageal reflux disease)   . High cholesterol   . Hypertension   . Personal history of radiation therapy   . Seasonal allergies     Past Surgical History:  Procedure Laterality Date  . ABDOMINAL HYSTERECTOMY    . BREAST BIOPSY  1970's  . BREAST BIOPSY Right 03/27/2016   INVASIVE MAMMARY CARCINOMA  . BREAST LUMPECTOMY Right 04/09/2016   radiation  . BREAST LUMPECTOMY WITH SENTINEL LYMPH NODE BIOPSY Right 04/09/2016   Procedure: BREAST LUMPECTOMY WITH SENTINEL LYMPH NODE BX;  Surgeon: Christene Lye, MD;  Location: [facility] ORS;  Service: General;  Laterality: Right;  . CAROTID ENDARTERECTOMY Left   . CHOLECYSTECTOMY    . COLONOSCOPY  2005  . DG  BONE DENSITY ([place] HX)  2010  . DIAGNOSTIC MAMMOGRAM  2015  . ESOPHAGOGASTRODUODENOSCOPY (EGD) WITH PROPOFOL N/A 05/05/2016   Procedure: ESOPHAGOGASTRODUODENOSCOPY (EGD) WITH PROPOFOL;  Surgeon: Lucilla Lame, MD;  Location: [facility] ENDOSCOPY;  Service: Endoscopy;  Laterality: N/A;  . EUS N/A 03/12/2016   Procedure: FULL UPPER ENDOSCOPIC ULTRASOUND (EUS) RADIAL;  Surgeon: Holly Bodily, MD;  Location: [facility] ENDOSCOPY;  Service: Endoscopy;  Laterality: N/A;  . GALLBLADDER SURGERY    .  PANCREAS SURGERY  01/24/2016   Dr Burt Knack  . pap smear  2014  . REPAIR OF PERFORATED ULCER N/A 01/24/2016   Procedure: Exploratory Laparotomy with biopsy of Pancreas;  Surgeon: Florene Glen, MD;  Location: [facility] ORS;  Service: General;  Laterality: N/A;  . UPPER GI ENDOSCOPY  03/12/2016   Dr Mont Dutton   Social History   Socioeconomic History  . Marital status: Widowed    Spouse name: Not on file  . Number of children: Not on file  . Years of education: Not on file  . Highest education level: Not on file  Social Needs  . Financial resource strain: Not on file  . Food insecurity - worry: Not on file  . Food insecurity - inability: Not on file  . Transportation needs - medical: Not on file  . Transportation needs - non-medical: Not on file  Occupational History  . Not on file  Tobacco Use  . Smoking status: Former Smoker    Packs/day: 0.50    Years: 10.00    Pack years: 5.00     Types: Cigarettes    Last attempt to quit: 1988    Years since quitting: 31.0  . Smokeless tobacco: Never Used  . Tobacco comment: quit 1988  Substance and Sexual Activity  . Alcohol use: No    Alcohol/week: 0.0 oz    Comment: wine twice a month   . Drug use: No  . Sexual activity: No  Other Topics Concern  . Not on file  Social History Narrative  . Not on file   Family History  Problem Relation Age of Onset  . Pneumonia Mother   . Depression Mother   . Depression Sister   . Breast cancer Sister 42  . Diabetes Sister   . Stroke Brother   . Heart disease Brother    Current Outpatient Medications on File Prior to Visit  Medication Sig  . acetaminophen (TYLENOL) 325 MG tablet Take 650 mg by mouth every 6 (six) hours as needed for mild pain.  . Adalimumab (HUMIRA PEN) 40 MG/0.8ML PNKT Inject 40 mg into the skin every 21 ( twenty-one) days. TAKES EVERY OTHER WEEK  LAST DOSE WAS IN 01-2016  . albuterol (PROVENTIL HFA;VENTOLIN HFA) 108 (90 Base) MCG/ACT inhaler Inhale 2 puffs into the lungs every 6 (six) hours as needed for wheezing or shortness of breath.  . ALBUTEROL IN Inhale 2 Inhalers into the lungs every 6 (six) hours as needed.  Marland Kitchen amLODipine (NORVASC) 5 MG tablet Take by mouth.  . budesonide-formoterol (SYMBICORT) 160-4.5 MCG/ACT inhaler Inhale 1 puff into the lungs 2 (two) times daily.  . calcium-vitamin D (OSCAL WITH D) 500-200 MG-UNIT TABS tablet Take by mouth.  . diclofenac sodium (VOLTAREN) 1 % GEL Apply 3 grams topically qid prn  . diphenhydramine-acetaminophen (TYLENOL PM) 25-500 MG TABS tablet Take 1-2 tablets by mouth at bedtime as needed (SLEEP). 1-2 TABLETS DEPENDING ON DIFFICULTLY SLEEPING  . feeding supplement (BOOST / RESOURCE BREEZE) LIQD Take 1 Container by mouth 3 (three) times daily between meals.  . fluticasone (FLONASE) 50 MCG/ACT nasal spray Place 2 sprays into both nostrils daily. Use for 4-6 weeks then stop and use seasonally or as needed.  . folic acid  (FOLVITE) 1 MG tablet Take by mouth.  . gabapentin (NEURONTIN) 300 MG capsule Take 1 q AM, 1 q Lunch, 1 q Dinner, and 2 q HS  . hydrochlorothiazide (HYDRODIURIL) 25 MG tablet Take 1 tablet (25 mg total) by mouth daily.  Marland Kitchen  HYDROcodone-acetaminophen (NORCO/VICODIN) 5-325 MG tablet   . letrozole (FEMARA) 2.5 MG tablet Take 1 tablet (2.5 mg total) by mouth daily.  Marland Kitchen losartan (COZAAR) 50 MG tablet Take 1 tablet (50 mg total) by mouth daily.  Marland Kitchen losartan-hydrochlorothiazide (HYZAAR) 100-25 MG tablet Take by mouth.  . magnesium sulfate 50 % injection Take by mouth.  . Melatonin 10 MG TABS Take 2 tablets by mouth at bedtime. Reported on 06/03/2015  . montelukast (SINGULAIR) 10 MG tablet Take 1 tablet (10 mg total) by mouth daily.  . Multiple Vitamin (MULTI-VITAMINS) TABS Take by mouth.  . Omega 3 1000 MG CAPS Take 1,000 mg by mouth daily. omega-3 fatty acids-vitamin E (FISH OIL) 1,000 mg  . omega-3 acid ethyl esters (LOVAZA) 1 g capsule Take by mouth.  . ranitidine (ZANTAC) 150 MG tablet Take 1 tablet (150 mg total) 2 (two) times daily by mouth. For up to 2-3 months, then gradually taper off  . simvastatin (ZOCOR) 20 MG tablet Take 1 tablet every other night. (Patient taking differently: Take 20 mg by mouth every other day. Take 1 tablet every night.)  . traZODone (DESYREL) 50 MG tablet Take 1 tablet (50 mg total) at bedtime by mouth. After 2-4 weeks, can increase to 1.5 tablet (75mg ) at bedtime   No current facility-administered medications on file prior to visit.     Review of Systems  Constitutional: Negative for activity change, appetite change, chills, diaphoresis, fatigue, fever and unexpected weight change.       Improved wt gain  HENT: Negative for congestion, hearing loss and sinus pressure.   Eyes: Negative for visual disturbance.  Respiratory: Negative for apnea, cough, choking, chest tightness, shortness of breath and wheezing.   Cardiovascular: Negative for chest pain, palpitations and  leg swelling.  Gastrointestinal: Negative for abdominal pain, anal bleeding, blood in stool, constipation, diarrhea, nausea and vomiting.  Endocrine: Negative for cold intolerance and polyuria.  Genitourinary: Positive for vaginal discharge (>6 months brownish more often, not active bleeding or yeast-like). Negative for difficulty urinating, dysuria, frequency and hematuria.  Musculoskeletal: Negative for arthralgias, back pain and neck pain.  Skin: Negative for rash.  Allergic/Immunologic: Negative for environmental allergies.  Neurological: Negative for dizziness, weakness, light-headedness, numbness and headaches.  Hematological: Negative for adenopathy.  Psychiatric/Behavioral: Negative for behavioral problems, dysphoric mood and sleep disturbance. The patient is not nervous/anxious.    Per HPI unless specifically indicated above     Objective:    BP 134/63   Pulse 82   Temp 98.3 F (36.8 C) (Oral)   Resp 16   Ht 5\' 3"  (1.6 m)   Wt 149 lb (67.6 kg)   BMI 26.39 kg/m   Wt Readings from Last 3 Encounters:  03/30/17 149 lb (67.6 kg)  02/15/17 144 lb (65.3 kg)  02/11/17 145 lb 1 oz (65.8 kg)    Physical Exam  Constitutional: She is oriented to person, place, and time. She appears well-developed and well-nourished. No distress.  Well-appearing, comfortable, cooperative  HENT:  Head: Normocephalic and atraumatic.  Mouth/Throat: Oropharynx is clear and moist.  R facial / periorbital region improved now without ecchymosis, still minimal tender over R bony cheek  Eyes: Conjunctivae and EOM are normal. Pupils are equal, round, and reactive to light. Right eye exhibits no discharge. Left eye exhibits no discharge.  Neck: Normal range of motion. Neck supple. No thyromegaly present.  Cardiovascular: Normal rate, regular rhythm, normal heart sounds and intact distal pulses.  No murmur heard. Pulmonary/Chest: Effort normal and  breath sounds normal. No respiratory distress. She has no  wheezes. She has no rales.  Abdominal: Soft. Bowel sounds are normal. She exhibits no distension and no mass. There is no tenderness.  Musculoskeletal: Normal range of motion. She exhibits no edema or tenderness.  Upper / Lower Extremities: - Normal muscle tone, strength bilateral upper extremities 5/5, lower extremities 5/5  Lymphadenopathy:    She has no cervical adenopathy.  Neurological: She is alert and oriented to person, place, and time.  Distal sensation intact to light touch all extremities  Skin: Skin is warm and dry. No rash noted. She is not diaphoretic. No erythema.  Psychiatric: She has a normal mood and affect. Her behavior is normal.  Well groomed, good eye contact, normal speech and thoughts  Nursing note and vitals reviewed.  Results for orders placed or performed in visit on 03/24/17  TSH  Result Value Ref Range   TSH 2.18 0.40 - 4.50 mIU/L  CBC with Differential/Platelet  Result Value Ref Range   WBC 8.9 3.8 - 10.8 Thousand/uL   RBC 4.03 3.80 - 5.10 Million/uL   Hemoglobin 12.7 11.7 - 15.5 g/dL   HCT 38.0 35.0 - 45.0 %   MCV 94.3 80.0 - 100.0 fL   MCH 31.5 27.0 - 33.0 pg   MCHC 33.4 32.0 - 36.0 g/dL   RDW 11.7 11.0 - 15.0 %   Platelets 468 (H) 140 - 400 Thousand/uL   MPV 9.8 7.5 - 12.5 fL   Neutro Abs 4,940 1,500 - 7,800 cells/uL   Lymphs Abs 2,723 850 - 3,900 cells/uL   WBC mixed population 846 200 - 950 cells/uL   Eosinophils Absolute 365 15 - 500 cells/uL   Basophils Absolute 27 0 - 200 cells/uL   Neutrophils Relative % 55.5 %   Total Lymphocyte 30.6 %   Monocytes Relative 9.5 %   Eosinophils Relative 4.1 %   Basophils Relative 0.3 %  Hemoglobin A1c  Result Value Ref Range   Hgb A1c MFr Bld 5.4 <5.7 % of total Hgb   Mean Plasma Glucose 108 (calc)   eAG (mmol/L) 6.0 (calc)  Lipid panel  Result Value Ref Range   Cholesterol 205 (H) <200 mg/dL   HDL 76 >50 mg/dL   Triglycerides 99 <150 mg/dL   LDL Cholesterol (Calc) 110 (H) mg/dL (calc)   Total  CHOL/HDL Ratio 2.7 <5.0 (calc)   Non-HDL Cholesterol (Calc) 129 <130 mg/dL (calc)  COMPLETE METABOLIC PANEL WITH GFR  Result Value Ref Range   Glucose, Bld 103 65 - 139 mg/dL   BUN 16 7 - 25 mg/dL   Creat 1.08 (H) 0.60 - 0.93 mg/dL   GFR, Est Non African American 50 (L) > OR = 60 mL/min/1.63m2   GFR, Est African American 58 (L) > OR = 60 mL/min/1.60m2   BUN/Creatinine Ratio 15 6 - 22 (calc)   Sodium 133 (L) 135 - 146 mmol/L   Potassium 4.1 3.5 - 5.3 mmol/L   Chloride 95 (L) 98 - 110 mmol/L   CO2 29 20 - 32 mmol/L   Calcium 9.4 8.6 - 10.4 mg/dL   Total Protein 7.6 6.1 - 8.1 g/dL   Albumin 3.8 3.6 - 5.1 g/dL   Globulin 3.8 (H) 1.9 - 3.7 g/dL (calc)   AG Ratio 1.0 1.0 - 2.5 (calc)   Total Bilirubin 0.5 0.2 - 1.2 mg/dL   Alkaline phosphatase (APISO) 87 33 - 130 U/L   AST 17 10 - 35 U/L   ALT 11  6 - 29 U/L      Assessment & Plan:   Problem List Items Addressed This Visit    COPD (chronic obstructive pulmonary disease) ([place])    Stable without exacerbation - Continues Albuterol rarely PRN - Follow-up as needed consider maintenance therapy if needed      GERD (gastroesophageal reflux disease)    Stable chronic problem, now seem controlled Followed by [facility] Dr Marius Ditch, last EGD 04/2016 Now off PPI and H2 blocker, able to taper off Continue Flonase for secretions Follow-up as needed  Additional concern positive FIT FOBT per medicare - advised may need repeat colonoscopy or further eval, can repeat FOBT testing in future, it could be positive from history of gastritis, she may follow-up with GI      Hyperlipidemia    Controlled cholesterol on statin intermittent and lifestyle Last lipid panel 03/2017  Plan: 1. Continue current meds - Simvastatin 20mg  every other night 2. 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 Continue current BP regimen - Losartan 50mg  daily - asked to check with pharmacy to review if affected by recall, may continue for now 2. Encourage improved lifestyle - low sodium diet, regular exercise as planned water aerobics / walking 3. Continue monitor BP outside office, bring readings to next visit, if persistently >140/90 or new symptoms notify office sooner 4. Follow-up 3 months      Hyponatremia    Stable without significant change Chronic problem Last BMET, trend      Insomnia    Mild improvement in interval with trazodone Persistent problem, seem primarily secondary to pain and stress/anxiety as well without dx of depression, seems multifactorial - Failed SSRI Lexapro  Plan: 1. Increase dose Trazodone trial from 50mg  nightly to 1.5 again and then up to 2 pills nightly for 100mg  for 2-3 weeks - notify office if need new rx higher dose - she did not benefit from 75 so will quickly inc to 100mg  2. Reviewed sleep hygiene again 3. Follow-up within 3 months for insomnia - can adjust dose further vs future consider other med options caution mirtazapine d/t wt gain now, may consider hyponotic agent such as a PRN Ambien      Malignant neoplasm of upper-inner quadrant of right breast in female, estrogen receptor positive ([place])    Stable without known recurrence S/p lumpectomy, radiation, femara Followed by General Surgery Dr Jamal Collin, now after retired will see Dr Harley Alto in next 2 months Has has negative Mammogram surveillance and self exams On Femara (aromatase inhib) since 05/2016, concern with some vaginal discharge, as likely side effect - advised her to contact her General Surgeon prescribing this med to review, no change at this time, will follow-up      RESOLVED: Malnutrition of moderate degree    Improved, now wt gain back to healthy BMI range >26 Suspect reduced appetite and reduced nutrition was 2/2 breast cancer / radiation and  treatment Considered Remeron in past for sleep and possibly for nutrition/appetite - agree to hold off for now      Rheumatoid arthritis involving multiple sites with positive rheumatoid factor ([place])    Stable with chronic pain in multiple joints Followed by Dr Jefm Bryant at [facility] and Dr Sharlet Salina for chronic pain at [facility] On [facility]ira since last visit for RA  Also on PRN Hydrocodone per Dr Sharlet Salina      Relevant Medications   HYDROcodone-acetaminophen (NORCO/VICODIN) 5-325 MG tablet    Other Visit Diagnoses    Annual physical exam    -  Primary Reviewed Health Maintenance, see above Advised may need repeat colorectal CA screening, follow-up plan Encouraged improve healthy lifestyle with diet and exercise, agree with plan for more walking and water aerobics Now wt stabilized, no longer malnutrition, back to healthier BMI    Fecal occult blood test positive       On medicare FIT testing 12/2016, positive x 2, advised to check with GI or Gen Surg for possible repeat colonoscopy, may need repeat FIT testing as well      No orders of the defined types were placed in this encounter.   Follow up plan: Return in about 3 months (around 06/30/2017) for HTN, Insomnia (med adjust) / F/u Surgery Br CA meds.  Elizabeth Bailey, [facility] 03/30/2017, 10:43 AM

## 2017-03-30 NOTE — Patient Instructions (Addendum)
Thank you for coming to the office today.  1.  Please ask Dr Tollie Pizza about the [facility] medicine and likely side effect of vaginal discharge / bleeding, I think this is most likely given the timeline of the new medicine last year and it's potential side effects to affect your body's estrogen.  Recently had Medicare FIT (FOBT) testing POSITIVE 12/2016 - may ask Dr Tollie Pizza about other options for screening and further evaluation, also can go back to GI if need as a possibility  2. For insomnia - take TWO Trazodone 50mg  at bedtime for dose 100mg  nightly - for 1-2 weeks, if helping sleep then can contact office and we can order MORE new rx higher dose  Please schedule a Follow-up Appointment to: Return in about 3 months (around 06/30/2017) for HTN, Insomnia (med adjust) / F/u Surgery Br CA meds.    If you have any other questions or concerns, please feel free to call the office or send a message through [place]. You may also schedule an earlier appointment if necessary.  Additionally, you may be receiving a survey about your experience at our office within a few days to 1 week by e-mail or mail. We value your feedback.  [name], DO [facility]

## 2017-03-30 NOTE — Assessment & Plan Note (Signed)
Stable with chronic pain in multiple joints Followed by Dr Jefm Bryant at [facility] and Dr Sharlet Salina for chronic pain at [facility] On Humira since last visit for RA Also on PRN Hydrocodone per Dr Sharlet Salina

## 2017-03-30 NOTE — Assessment & Plan Note (Signed)
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 Continue current BP regimen - Losartan 50mg  daily - asked to check with pharmacy to review if affected by recall, may continue for now 2. Encourage improved lifestyle - low sodium diet, regular exercise as planned water aerobics / walking 3. Continue monitor BP outside office, bring readings to next visit, if persistently >140/90 or new symptoms notify office sooner 4. Follow-up 3 months

## 2017-03-30 NOTE — Assessment & Plan Note (Signed)
Controlled cholesterol on statin intermittent and lifestyle Last lipid panel 03/2017  Plan: 1. Continue current meds - Simvastatin 20mg  every other night 2. Encourage improved lifestyle - low carb/cholesterol, reduce portion size, continue improving regular exercise as planned

## 2017-03-30 NOTE — Assessment & Plan Note (Signed)
Stable chronic problem, now seem controlled Followed by [facility] Dr Marius Ditch, last EGD 04/2016 Now off PPI and H2 blocker, able to taper off Continue Flonase for secretions Follow-up as needed  Additional concern positive FIT FOBT per medicare - advised may need repeat colonoscopy or further eval, can repeat FOBT testing in future, it could be positive from history of gastritis, she may follow-up with GI

## 2017-03-30 NOTE — Telephone Encounter (Signed)
Called patient and left a message on home number. Calling to find out why patient has not had her CA 27.29 that was ordered back in December 2018 by Dr.Sankar

## 2017-03-31 ENCOUNTER — Other Ambulatory Visit: Payer: Self-pay | Admitting: Family Medicine

## 2017-03-31 DIAGNOSIS — F5101 Primary insomnia: Secondary | ICD-10-CM

## 2017-04-07 ENCOUNTER — Telehealth: Payer: Self-pay

## 2017-04-07 NOTE — Telephone Encounter (Signed)
Patient called back and is aware of needed lab CA 27-29. He son had a mild heart attack and she has been dealing with that. She will get her labs done today.

## 2017-04-08 LAB — CANCER ANTIGEN 27.29: CA 27.29: 42 U/mL — ABNORMAL HIGH (ref 0.0–38.6)

## 2017-04-20 ENCOUNTER — Other Ambulatory Visit: Payer: Self-pay | Admitting: Nurse Practitioner

## 2017-04-20 DIAGNOSIS — I10 Essential (primary) hypertension: Secondary | ICD-10-CM

## 2017-04-22 ENCOUNTER — Other Ambulatory Visit: Payer: Self-pay | Admitting: Family Medicine

## 2017-04-22 DIAGNOSIS — F5101 Primary insomnia: Secondary | ICD-10-CM

## 2017-05-21 ENCOUNTER — Other Ambulatory Visit: Payer: Self-pay | Admitting: Student

## 2017-05-21 DIAGNOSIS — F5101 Primary insomnia: Secondary | ICD-10-CM

## 2017-05-21 MED ORDER — TRAZODONE HCL 50 MG PO TABS: 50.0000 mg | ORAL_TABLET | Freq: Every day | ORAL | 5 refills | Status: DC

## 2017-05-21 NOTE — Telephone Encounter (Signed)
Pt needs a refill on trazodone.

## 2017-06-09 ENCOUNTER — Telehealth: Payer: Self-pay | Admitting: *Deleted

## 2017-06-09 DIAGNOSIS — C50211 Malignant neoplasm of upper-inner quadrant of right female breast: Secondary | ICD-10-CM

## 2017-06-09 DIAGNOSIS — Z170 Estrogen receptor positive status [ER+]: Principal | ICD-10-CM

## 2017-06-09 MED ORDER — LETROZOLE 2.5 MG PO TABS: 2.5000 mg | ORAL_TABLET | Freq: Every day | ORAL | 2 refills | Status: DC

## 2017-06-09 NOTE — Telephone Encounter (Signed)
I talked with the patient and she is aware that Dr Bary Castilla would like a repeat of the CA 27.29 since it was elevated in February, pt agrees. Will mail lab form to patient, she will go to [facility] to have drawn. She is also aware that we will send in a new refill RX to [facility] for her letrozole.

## 2017-07-07 ENCOUNTER — Encounter: Payer: Self-pay | Admitting: Family Medicine

## 2017-07-07 ENCOUNTER — Ambulatory Visit: Payer: Medicare Other | Admitting: Family Medicine

## 2017-07-07 VITALS — BP 128/65 | HR 68 | Temp 98.0°F | Resp 16 | Ht 63.0 in | Wt 147.0 lb

## 2017-07-07 DIAGNOSIS — M19042 Primary osteoarthritis, left hand: Secondary | ICD-10-CM

## 2017-07-07 DIAGNOSIS — F5101 Primary insomnia: Secondary | ICD-10-CM

## 2017-07-07 DIAGNOSIS — I10 Essential (primary) hypertension: Secondary | ICD-10-CM

## 2017-07-07 DIAGNOSIS — M19041 Primary osteoarthritis, right hand: Secondary | ICD-10-CM

## 2017-07-07 DIAGNOSIS — M0579 Rheumatoid arthritis with rheumatoid factor of multiple sites without organ or systems involvement: Secondary | ICD-10-CM | POA: Diagnosis not present

## 2017-07-07 MED ORDER — LOSARTAN POTASSIUM 50 MG PO TABS: 50.0000 mg | ORAL_TABLET | Freq: Every day | ORAL | 3 refills | Status: DC

## 2017-07-07 MED ORDER — HYDROCHLOROTHIAZIDE 25 MG PO TABS: 25.0000 mg | ORAL_TABLET | Freq: Every day | ORAL | 3 refills | Status: DC

## 2017-07-07 NOTE — Assessment & Plan Note (Signed)
Stable currently, followed by [facility] Rheum Update now with hands seem to be OA only not RA

## 2017-07-07 NOTE — Assessment & Plan Note (Addendum)
Some improvement but now less improved on Trazodone Persistent problem, seem primarily secondary to pain and stress/anxiety as well without dx of depression, seems multifactorial - Failed SSRI Lexapro  Plan: 1. Last trial on Trazodone - increase dose Trazodone trial from 100mg  nightly to x 3 tabs for 150mg  nightly - top dose, use existing rx, if not effective by 5 days then can start taper down to 2 pills nightly for 100mg  for 5 days then 1 pill for 5 days then OFF - She should notify us if need new med as discussed, Mirtazapine - will send 7.5mg  nightly if she request, as option, discussed benefit risk side effect, some inc appetite and wt gain, she has normal appetite now previously reduce appetite from cancer - Last option will be hypnotic sedative such as Ambien PRN only not nightly 2. Reviewed sleep hygiene again 3. Follow-up within 6 months - sooner if worsening

## 2017-07-07 NOTE — Progress Notes (Signed)
Subjective:    Patient ID: Elizabeth Bailey, female    DOB: 04/30/1940, 77 y.o.   MRN: [id number]  Elizabeth Bailey is a 77 y.o. female presenting on 07/07/2017 for Hypertension and Insomnia   HPI    FOLLOW-UP / Updates - Osteoarthritis / Rheumatoid Arthritis Followed by Dr Jefm Bryant at [facility], also with Dr Sharlet Salina at [facility] for chronic pain - Last seen by Dr Jefm Bryant 05/2017 for hand pain - dx with Osteoarthritis in hands and Rheumatoid arthritis everywhere else other joints since 2010-2012 taking Humira, she was referred to [facility] Neurology Dr Melrose Nakayama, had NCS and was told no nerve damage, only have arthritis  FOLLOW-UP Insomnia - Last visit with 03/2017 for annual visit and reviewed insomnia, increased Trazodone nightly from 50 to 100 to help with mood and insomnia, as she has failed SSRI Lexapro and Flexeril muscle relaxant in past for sleep, see prior notes for background information. - Today patient reportsstill insomnia but not significantly improved enough on higher dose, cannot tell if it is effective, she is taking two of the 50mg , never tried taking 3 pills - Admits active mind at night, turns TV on sometimes but she has hard time sleeping still, no particular reason - Prior history of PSG in past, reportedly these were all negative, never dx with sleep apnea, despite this on her problem list - Denies any depression, had history of depression on her problem list but denies this, and see score PHQ2 = 0 - has answered other questions although PHQ should be 0  CHRONIC HTN: Reports no new concerns, her BP is well controlled Current Meds - HCTZ 25mg  daily, Losartan 50mg  daily   Reports good compliance, took meds today. Tolerating well, w/o complaints. Denies CP, dyspnea, HA, edema, dizziness / lightheadedness   Depression screen [facility] 2/9 07/07/2017 03/30/2017 02/11/2017  Decreased Interest 0 0 0  Down, Depressed, Hopeless 0 0 0  PHQ - 2 Score 0 0 0  Altered sleeping - 3 -  Tired, decreased  energy - 1 -  Change in appetite - 0 -  Feeling bad or failure about yourself  - 0 -  Trouble concentrating - 0 -  Moving slowly or fidgety/restless - 0 -  Suicidal thoughts - 0 -  PHQ-9 Score - 4 -  Difficult doing work/chores - Somewhat difficult -    Social History   Tobacco Use  . Smoking status: Former Smoker    Packs/day: 0.50    Years: 10.00    Pack years: 5.00    Types: Cigarettes    Last attempt to quit: 1988    Years since quitting: 31.3  . Smokeless tobacco: Never Used  . Tobacco comment: quit 1988  Substance Use Topics  . Alcohol use: No    Alcohol/week: 0.0 oz    Comment: wine twice a month   . Drug use: No    Review of Systems Per HPI unless specifically indicated above     Objective:    BP 128/65   Pulse 68   Temp 98 F (36.7 C) (Oral)   Resp 16   Ht 5\' 3"  (1.6 m)   Wt 147 lb (66.7 kg)   BMI 26.04 kg/m   Wt Readings from Last 3 Encounters:  07/07/17 147 lb (66.7 kg)  03/30/17 149 lb (67.6 kg)  02/15/17 144 lb (65.3 kg)    Physical Exam  Constitutional: She is oriented to person, place, and time. She appears well-developed and well-nourished. No distress.  Well-appearing, comfortable, cooperative  HENT:  Head: Normocephalic and atraumatic.  Mouth/Throat: Oropharynx is clear and moist.  Eyes: Conjunctivae are normal. Right eye exhibits no discharge. Left eye exhibits no discharge.  Neck: Normal range of motion. Neck supple. No thyromegaly present.  Cardiovascular: Normal rate, regular rhythm, normal heart sounds and intact distal pulses.  No murmur heard. Pulmonary/Chest: Effort normal and breath sounds normal. No respiratory distress. She has no wheezes. She has no rales.  Musculoskeletal: Normal range of motion. She exhibits no edema.  Lymphadenopathy:    She has no cervical adenopathy.  Neurological: She is alert and oriented to person, place, and time.  Skin: Skin is warm and dry. No rash noted. She is not diaphoretic. No erythema.    Psychiatric: She has a normal mood and affect. Her behavior is normal.  Well groomed, good eye contact, normal speech and thoughts  Nursing note and vitals reviewed.  Results for orders placed or performed in visit on 03/24/17  TSH  Result Value Ref Range   TSH 2.18 0.40 - 4.50 mIU/L  CBC with Differential/Platelet  Result Value Ref Range   WBC 8.9 3.8 - 10.8 Thousand/uL   RBC 4.03 3.80 - 5.10 Million/uL   Hemoglobin 12.7 11.7 - 15.5 g/dL   HCT 38.0 35.0 - 45.0 %   MCV 94.3 80.0 - 100.0 fL   MCH 31.5 27.0 - 33.0 pg   MCHC 33.4 32.0 - 36.0 g/dL   RDW 11.7 11.0 - 15.0 %   Platelets 468 (H) 140 - 400 Thousand/uL   MPV 9.8 7.5 - 12.5 fL   Neutro Abs 4,940 1,500 - 7,800 cells/uL   Lymphs Abs 2,723 850 - 3,900 cells/uL   WBC mixed population 846 200 - 950 cells/uL   Eosinophils Absolute 365 15 - 500 cells/uL   Basophils Absolute 27 0 - 200 cells/uL   Neutrophils Relative % 55.5 %   Total Lymphocyte 30.6 %   Monocytes Relative 9.5 %   Eosinophils Relative 4.1 %   Basophils Relative 0.3 %  Hemoglobin A1c  Result Value Ref Range   Hgb A1c MFr Bld 5.4 <5.7 % of total Hgb   Mean Plasma Glucose 108 (calc)   eAG (mmol/L) 6.0 (calc)  Lipid panel  Result Value Ref Range   Cholesterol 205 (H) <200 mg/dL   HDL 76 >50 mg/dL   Triglycerides 99 <150 mg/dL   LDL Cholesterol (Calc) 110 (H) mg/dL (calc)   Total CHOL/HDL Ratio 2.7 <5.0 (calc)   Non-HDL Cholesterol (Calc) 129 <130 mg/dL (calc)  COMPLETE METABOLIC PANEL WITH GFR  Result Value Ref Range   Glucose, Bld 103 65 - 139 mg/dL   BUN 16 7 - 25 mg/dL   Creat 1.08 (H) 0.60 - 0.93 mg/dL   GFR, Est Non African American 50 (L) > OR = 60 mL/min/1.50m2   GFR, Est African American 58 (L) > OR = 60 mL/min/1.17m2   BUN/Creatinine Ratio 15 6 - 22 (calc)   Sodium 133 (L) 135 - 146 mmol/L   Potassium 4.1 3.5 - 5.3 mmol/L   Chloride 95 (L) 98 - 110 mmol/L   CO2 29 20 - 32 mmol/L   Calcium 9.4 8.6 - 10.4 mg/dL   Total Protein 7.6 6.1 - 8.1  g/dL   Albumin 3.8 3.6 - 5.1 g/dL   Globulin 3.8 (H) 1.9 - 3.7 g/dL (calc)   AG Ratio 1.0 1.0 - 2.5 (calc)   Total Bilirubin 0.5 0.2 - 1.2 mg/dL   Alkaline  phosphatase (APISO) 87 33 - 130 U/L   AST 17 10 - 35 U/L   ALT 11 6 - 29 U/L      Assessment & Plan:   Problem List Items Addressed This Visit    Hypertension    Well-controlled HTN - Home BP readings stable  Elevated Cr w/ CKD II-III    Plan:  1. Continue current BP regimen - offered to reduce dose of HCTZ since no edema and only on it for BP control - can try combo pill Losartan 50-HCTZ 12.5mg  but she declines at the moment - keep meds same - Refill sent Losartan 50mg  daily - Refill sent HCTZ 25mg  daily - 90 day supply each 2. Encourage improved lifestyle - low sodium diet, regular exercise as planned water aerobics / walking 3. Continue monitor BP outside office, bring readings to next visit, if persistently >140/90 or new symptoms notify office sooner 4. Follow-up 6 months      Relevant Medications   losartan (COZAAR) 50 MG tablet   hydrochlorothiazide (HYDRODIURIL) 25 MG tablet   Insomnia - Primary    Some improvement but now less improved on Trazodone Persistent problem, seem primarily secondary to pain and stress/anxiety as well without dx of depression, seems multifactorial - Failed SSRI Lexapro  Plan: 1. Last trial on Trazodone - increase dose Trazodone trial from 100mg  nightly to x 3 tabs for 150mg  nightly - top dose, use existing rx, if not effective by 5 days then can start taper down to 2 pills nightly for 100mg  for 5 days then 1 pill for 5 days then OFF - She should notify us if need new med as discussed, Mirtazapine - will send 7.5mg  nightly if she request, as option, discussed benefit risk side effect, some inc appetite and wt gain, she has normal appetite now previously reduce appetite from cancer - Last option will be hypnotic sedative such as Ambien PRN only not nightly 2. Reviewed sleep hygiene again 3.  Follow-up within 6 months - sooner if worsening      Osteoarthritis of hands, bilateral   Rheumatoid arthritis involving multiple sites with positive rheumatoid factor ([place])    Stable currently, followed by [facility] Rheum Update now with hands seem to be OA only not RA         Meds ordered this encounter  Medications  . losartan (COZAAR) 50 MG tablet    Sig: Take 1 tablet (50 mg total) by mouth daily.    Dispense:  90 tablet    Refill:  3    Refill, 90 day rx  . hydrochlorothiazide (HYDRODIURIL) 25 MG tablet    Sig: Take 1 tablet (25 mg total) by mouth daily.    Dispense:  90 tablet    Refill:  3    Change to 90 day rx    Follow up plan: Return in about 6 months (around 01/07/2018) for Insomnia HTN med adjust.  [name], DO [facility] 07/07/2017, 1:37 PM

## 2017-07-07 NOTE — Patient Instructions (Addendum)
Thank you for coming to the office today.  Blood pressure is well controlled, on current medicine.  Refilled Losartan 50mg  daily - 90 pills per bottle  Refilled HCTZ 25mg  daily - 90 pills per bottle  We change it to a COMBINATION ONE PILL per day in future - but it would only be lower dose HCTZ 12.5mg  instead in future  For Sleep  INCREASE Trazodone 50mg  - now take 3 at same time bedtime for sleep - you will need to pick up next refill bottle, and start this higher dose, if doesn't work by day 5, then call office or sent mychart message and we can switch medicines - If we switch meds need to taper off current Trazodone first before starting new one MIrtazapine - REDUCE Trazodone to 2 pills at bedtime for 3 days, then REDUCE again down to 1 pill bedtime for 5 days, then OFF and start new pill nightly  In future again if this doesn't work we can consider adding Ambien or similar medicine to help sleep as needed  Caution with inc appetite and potential weight gain   Sleep Hygiene Recommendations to promote healthy sleep in all patients, especially if symptoms of insomnia are worsening. Due to the nature of sleep rhythms, if your body gets "out of rhythm", it may take some time before your sleep cycle can be "reset".  Please try to follow as many of the following tips as you can, usually there are only a few of these are the primary cause of the problem.  ?To reset your sleep rhythm, go to bed and get up at the same time every day ?Sleep only long enough to feel rested and then get out of bed ?Do not try to force yourself to sleep. If you can't sleep, get out of bed and try again later. ?Avoid naps during the day, unless excessively tired. The more sleeping during the day, then the less sleep your body needs at night.  ?Have coffee, tea, and other foods that have caffeine only in the morning ?Exercise several days a week, but not right before bed ?If you drink alcohol, prefer to have  appropriate drink with one meal, but prefer to avoid alcohol in the evening, and bedtime ?If you smoke, avoid smoking, especially in the evening  ?Avoid watching TV or looking at phones, computers, or reading devices ("e-books") that give off light at least 30 minutes before bed. This artificial light sends "awake signals" to your brain and can make it harder to fall asleep. ?Make your bedroom a comfortable place where it is easy to fall asleep: ? Put up shades or special blackout curtains to block light from outside. ? Use a white noise machine to block noise. ? Keep the temperature cool. ?Try your best to solve or at least address your problems before you go to bed ?Use relaxation techniques to manage stress. Ask your health care provider to suggest some techniques that may work well for you. These may include: ? Breathing exercises. ? Routines to release muscle tension. ? Visualizing peaceful scenes.   Please schedule a Follow-up Appointment to: Return in about 6 months (around 01/07/2018) for Insomnia HTN med adjust.  If you have any other questions or concerns, please feel free to call the office or send a message through [facility]. You may also schedule an earlier appointment if necessary.  Additionally, you may be receiving a survey about your experience at our office within a few days to 1 week by e-mail  or mail. We value your feedback.  [name], DO [facility]

## 2017-07-07 NOTE — Assessment & Plan Note (Addendum)
Well-controlled HTN - Home BP readings stable  Elevated Cr w/ CKD II-III    Plan:  1. Continue current BP regimen - offered to reduce dose of HCTZ since no edema and only on it for BP control - can try combo pill Losartan 50-HCTZ 12.5mg  but she declines at the moment - keep meds same - Refill sent Losartan 50mg  daily - Refill sent HCTZ 25mg  daily - 90 day supply each 2. Encourage improved lifestyle - low sodium diet, regular exercise as planned water aerobics / walking 3. Continue monitor BP outside office, bring readings to next visit, if persistently >140/90 or new symptoms notify office sooner 4. Follow-up 6 months

## 2017-07-12 ENCOUNTER — Telehealth: Payer: Self-pay | Admitting: *Deleted

## 2017-07-12 NOTE — Telephone Encounter (Signed)
Patient called and stated that she never got a lab slip in the mail for her labs and also she stated that she never picked up the prescription for letrozole.

## 2017-07-12 NOTE — Telephone Encounter (Signed)
Patient is going to get labs done and she did pick her [employer]

## 2017-07-16 ENCOUNTER — Other Ambulatory Visit: Payer: Self-pay | Admitting: Family Medicine

## 2017-07-16 DIAGNOSIS — F5101 Primary insomnia: Secondary | ICD-10-CM

## 2017-07-16 MED ORDER — TRAZODONE HCL 150 MG PO TABS: 150.0000 mg | ORAL_TABLET | Freq: Every day | ORAL | 5 refills | Status: DC

## 2017-07-16 NOTE — Telephone Encounter (Signed)
Pt. Called states that taken  3 Trazodone 50 mg worked for her. Pt  Call back  # is [phone]

## 2017-07-16 NOTE — Telephone Encounter (Signed)
Seen recently and asked to inc Trazodone 50mg  from 2 pills to 3 pills nightly - now she states it is helping her insomnia, will send new rx today for Trazodone 150mg  tabs - take one nightly, new higher dose.  [name], [facility] 07/16/2017, 12:24 PM

## 2017-08-12 ENCOUNTER — Other Ambulatory Visit: Payer: Self-pay

## 2017-08-12 ENCOUNTER — Ambulatory Visit
Admission: RE | Admit: 2017-08-12 | Discharge: 2017-08-12 | Disposition: A | Discharge status: Home / Self Care | Payer: Medicare Other | Source: Ambulatory Visit | Attending: Radiation Oncology | Admitting: Radiation Oncology

## 2017-08-12 ENCOUNTER — Encounter: Payer: Self-pay | Admitting: Radiation Oncology

## 2017-08-12 VITALS — BP 115/68 | HR 84 | Temp 97.1°F | Resp 18 | Wt 146.5 lb

## 2017-08-12 DIAGNOSIS — Z170 Estrogen receptor positive status [ER+]: Secondary | ICD-10-CM | POA: Insufficient documentation

## 2017-08-12 DIAGNOSIS — C50211 Malignant neoplasm of upper-inner quadrant of right female breast: Secondary | ICD-10-CM | POA: Diagnosis not present

## 2017-08-12 DIAGNOSIS — Z923 Personal history of irradiation: Secondary | ICD-10-CM | POA: Insufficient documentation

## 2017-08-12 DIAGNOSIS — Z79811 Long term (current) use of aromatase inhibitors: Secondary | ICD-10-CM | POA: Insufficient documentation

## 2017-08-12 NOTE — Progress Notes (Signed)
Radiation Oncology Follow up Note  Name: Elizabeth Bailey   Date:   08/12/2017 MRN:  [id number] DOB: 06-Jun-1940    This 77 y.o. female presents to the clinic today for 1 year follow-up status post whole breast radiation to her right breast for stage I ER/PR positive invasive mammary carcinoma.  REFERRING PROVIDER: [name]  HPI: patient is a 77 year old female now out 1 year having completed whole breast radiation to her right breast for stage I ER/PR positive invasive mammary carcinoma. Seen today in routine follow-up she is doing well. She specifically denies breast tenderness cough or bone pain..she had mammograms back in November 2018 which I have reviewed showing BI-RADS 2 benign findings.she is currently on Femara tolerating that well without side effect.  COMPLICATIONS OF TREATMENT: none  FOLLOW UP COMPLIANCE: keeps appointments   PHYSICAL EXAM:  BP 115/68   Pulse 84   Temp (!) 97.1 F (36.2 C)   Resp 18   Wt 146 lb 7.9 oz (66.4 kg)   BMI 25.95 kg/m  Lungs are clear to A&P cardiac examination essentially unremarkable with regular rate and rhythm. No dominant mass or nodularity is noted in either breast in 2 positions examined. Incision is well-healed. No axillary or supraclavicular adenopathy is appreciated. Cosmetic result is excellent. Well-developed well-nourished patient in NAD. HEENT reveals PERLA, EOMI, discs not visualized.  Oral cavity is clear. No oral mucosal lesions are identified. Neck is clear without evidence of cervical or supraclavicular adenopathy. Lungs are clear to A&P. Cardiac examination is essentially unremarkable with regular rate and rhythm without murmur rub or thrill. Abdomen is benign with no organomegaly or masses noted. Motor sensory and DTR levels are equal and symmetric in the upper and lower extremities. Cranial nerves II through XII are grossly intact. Proprioception is intact. No peripheral adenopathy or edema is identified. No motor or  sensory levels are noted. Crude visual fields are within normal range.  RADIOLOGY RESULTS: mammograms reviewed and compatible with the above-stated findings  PLAN: present time patient is doing well with no evidence of disease. I am please were overall progress. I've asked to see her back in 1 year for follow-up. She continues onFemara. She's are scheduled for follow-up mammograms. Patient is to call with any concerns.    Noreene Filbert, MD

## 2017-10-15 ENCOUNTER — Other Ambulatory Visit: Payer: Self-pay | Admitting: Family Medicine

## 2017-10-15 DIAGNOSIS — J302 Other seasonal allergic rhinitis: Secondary | ICD-10-CM

## 2017-10-21 ENCOUNTER — Encounter: Payer: Self-pay | Admitting: Family Medicine

## 2017-10-21 DIAGNOSIS — I739 Peripheral vascular disease, unspecified: Secondary | ICD-10-CM | POA: Insufficient documentation

## 2017-10-22 ENCOUNTER — Telehealth: Payer: Self-pay | Admitting: Family Medicine

## 2017-10-22 NOTE — Telephone Encounter (Signed)
Pt asked for a referral to Dr. Norlene Duel at [facility] in [place] to raise eye lids.  Her call back number is [phone] or [phone]

## 2017-11-02 ENCOUNTER — Telehealth: Payer: Self-pay | Admitting: Family Medicine

## 2017-11-02 NOTE — Telephone Encounter (Signed)
Nisha, I spoke with pt to re-sched awv for Nov she mentioned that she needed a referral from Dr. Raliegh Ip for her eyelids to be lifted. She gave me the name of [facility] with [facility].  Please call pt to discuss. She can be reached on cell [phone] [email or web address]  Thank you, Jill Alexanders

## 2017-11-03 ENCOUNTER — Other Ambulatory Visit: Payer: Self-pay

## 2017-11-03 DIAGNOSIS — H518 Other specified disorders of binocular movement: Secondary | ICD-10-CM

## 2017-11-04 NOTE — Telephone Encounter (Signed)
Referral has been placed. 

## 2017-11-04 NOTE — Progress Notes (Unsigned)
am

## 2017-11-15 ENCOUNTER — Other Ambulatory Visit: Payer: Self-pay | Admitting: Family Medicine

## 2017-11-15 DIAGNOSIS — M0579 Rheumatoid arthritis with rheumatoid factor of multiple sites without organ or systems involvement: Secondary | ICD-10-CM

## 2017-12-08 ENCOUNTER — Ambulatory Visit (INDEPENDENT_AMBULATORY_CARE_PROVIDER_SITE_OTHER): Payer: Medicare Other

## 2017-12-08 DIAGNOSIS — Z23 Encounter for immunization: Secondary | ICD-10-CM

## 2017-12-20 ENCOUNTER — Other Ambulatory Visit: Payer: Self-pay

## 2017-12-20 DIAGNOSIS — C50211 Malignant neoplasm of upper-inner quadrant of right female breast: Secondary | ICD-10-CM

## 2017-12-20 DIAGNOSIS — Z170 Estrogen receptor positive status [ER+]: Principal | ICD-10-CM

## 2017-12-28 ENCOUNTER — Other Ambulatory Visit: Payer: Self-pay

## 2017-12-28 DIAGNOSIS — Z170 Estrogen receptor positive status [ER+]: Principal | ICD-10-CM

## 2017-12-28 DIAGNOSIS — C50211 Malignant neoplasm of upper-inner quadrant of right female breast: Secondary | ICD-10-CM

## 2018-01-04 ENCOUNTER — Ambulatory Visit: Payer: Medicare Other

## 2018-01-04 ENCOUNTER — Ambulatory Visit: Payer: Medicare Other | Admitting: Family Medicine

## 2018-01-05 ENCOUNTER — Ambulatory Visit (INDEPENDENT_AMBULATORY_CARE_PROVIDER_SITE_OTHER): Payer: Medicare Other

## 2018-01-05 ENCOUNTER — Encounter: Payer: Self-pay | Admitting: Family Medicine

## 2018-01-05 ENCOUNTER — Ambulatory Visit: Payer: Medicare Other | Admitting: Family Medicine

## 2018-01-05 VITALS — BP 138/68 | HR 89 | Temp 98.1°F | Ht 63.0 in | Wt 139.0 lb

## 2018-01-05 VITALS — BP 138/74 | HR 89 | Temp 98.1°F | Resp 16 | Ht 63.0 in | Wt 139.0 lb

## 2018-01-05 DIAGNOSIS — Z0000 Encounter for general adult medical examination without abnormal findings: Secondary | ICD-10-CM

## 2018-01-05 DIAGNOSIS — I10 Essential (primary) hypertension: Secondary | ICD-10-CM

## 2018-01-05 DIAGNOSIS — F5101 Primary insomnia: Secondary | ICD-10-CM

## 2018-01-05 DIAGNOSIS — K219 Gastro-esophageal reflux disease without esophagitis: Secondary | ICD-10-CM

## 2018-01-05 MED ORDER — TETANUS-DIPHTH-ACELL PERTUSSIS 5-2.5-18.5 LF-MCG/0.5 IM SUSP: 0.5000 mL | Freq: Once | INTRAMUSCULAR | 0 refills | Status: AC

## 2018-01-05 MED ORDER — OMEPRAZOLE 20 MG PO CPDR: 20.0000 mg | DELAYED_RELEASE_CAPSULE | Freq: Every day | ORAL | 1 refills | Status: DC

## 2018-01-05 MED ORDER — ZOSTER VAC RECOMB ADJUVANTED 50 MCG/0.5ML IM SUSR: 0.5000 mL | Freq: Once | INTRAMUSCULAR | 1 refills | Status: AC

## 2018-01-05 MED ORDER — ZOLPIDEM TARTRATE 5 MG PO TABS: 5.0000 mg | ORAL_TABLET | Freq: Every evening | ORAL | 2 refills | Status: DC | PRN

## 2018-01-05 NOTE — Progress Notes (Signed)
Subjective:    Patient ID: Elizabeth Bailey, female    DOB: 12/20/1940, 77 y.o.   MRN: [id number]  Elizabeth Bailey is a 77 y.o. female presenting on 01/05/2018 for Hypertension and Insomnia   HPI   Patient was also seen for AMW today as well.  GERD Previously controlled, now has some worsening. Not controlled now on Zantac. Requesting change of med, prior on PPI, would like to restart Omeprazole  FOLLOW-UP Insomnia - Follow-up from last visit 06/2017, trial on inc trazodone, seemed ineffective, now off med - Goes to bed around 8-830pm, wakes up around 6-630am, asking if goes to bed too early - Quit taking Melatonin - interested in [place] trial - has some pain from breast cancer radiation at times in chest, rarely takes hydrocodone, would not take together - Admits active mind at night, turns TV on sometimes but she has hard time sleeping still, no particular reason - tried to improve sleep hygiene - Prior history of PSG in past, reportedly these were all negative, never dx with sleep apnea, despite this on her problem list - Denies any depression, had history of depression on her problem list but denies this, and see score PHQ2 = 0- has answered other questions although PHQ should be 0  CHRONIC HTN: Home readings BP 140-150s / 80-90s. Concerns it is still elevated. Current Meds - HCTZ 25mg  daily, Losartan 50mg  daily   Reports good compliance, took meds today. Tolerating well, w/o complaints. Improved exercise / walking Admits some life stressor with a tenant at son's property Denies CP, dyspnea, HA, edema, dizziness / lightheadedness  Health Maintenance: UTD Flu Vaccine already 12/08/17  Depression screen [facility] 2/9 01/05/2018 08/12/2017 07/07/2017  Decreased Interest 0 0 0  Down, Depressed, Hopeless 0 0 0  PHQ - 2 Score 0 0 0  Altered sleeping - - -  Tired, decreased energy - - -  Change in appetite - - -  Feeling bad or failure about yourself  - - -  Trouble concentrating - - -    Moving slowly or fidgety/restless - - -  Suicidal thoughts - - -  PHQ-9 Score - - -  Difficult doing work/chores - - -    Social History   Tobacco Use  . Smoking status: Former Smoker    Packs/day: 0.50    Years: 10.00    Pack years: 5.00    Types: Cigarettes    Last attempt to quit: 1988    Years since quitting: 31.8  . Smokeless tobacco: Never Used  . Tobacco comment: quit 1988  Substance Use Topics  . Alcohol use: No    Alcohol/week: 0.0 standard drinks    Comment: wine twice a month   . Drug use: No    Review of Systems Per HPI unless specifically indicated above     Objective:    BP 138/74 (BP Location: Left Arm, Cuff Size: Normal)   Pulse 89   Temp 98.1 F (36.7 C) (Oral)   Resp 16   Ht 5\' 3"  (1.6 m)   Wt 139 lb (63 kg)   BMI 24.62 kg/m   Wt Readings from Last 3 Encounters:  01/05/18 139 lb (63 kg)  01/05/18 139 lb (63 kg)  08/12/17 146 lb 7.9 oz (66.4 kg)    Physical Exam  Constitutional: She is oriented to person, place, and time. She appears well-developed and well-nourished. No distress.  Well-appearing, comfortable, cooperative  HENT:  Head: Normocephalic and atraumatic.  Mouth/Throat: Oropharynx  is clear and moist.  Eyes: Conjunctivae are normal. Right eye exhibits no discharge. Left eye exhibits no discharge.  Cardiovascular: Normal rate.  Pulmonary/Chest: Effort normal.  Musculoskeletal: She exhibits no edema.  Neurological: She is alert and oriented to person, place, and time.  Skin: Skin is warm and dry. No rash noted. She is not diaphoretic. No erythema.  Psychiatric: She has a normal mood and affect. Her behavior is normal.  Well groomed, good eye contact, normal speech and thoughts  Nursing note and vitals reviewed.  Results for orders placed or performed in visit on 03/24/17  TSH  Result Value Ref Range   TSH 2.18 0.40 - 4.50 mIU/L  CBC with Differential/Platelet  Result Value Ref Range   WBC 8.9 3.8 - 10.8 Thousand/uL   RBC  4.03 3.80 - 5.10 Million/uL   Hemoglobin 12.7 11.7 - 15.5 g/dL   HCT 38.0 35.0 - 45.0 %   MCV 94.3 80.0 - 100.0 fL   MCH 31.5 27.0 - 33.0 pg   MCHC 33.4 32.0 - 36.0 g/dL   RDW 11.7 11.0 - 15.0 %   Platelets 468 (H) 140 - 400 Thousand/uL   MPV 9.8 7.5 - 12.5 fL   Neutro Abs 4,940 1,500 - 7,800 cells/uL   Lymphs Abs 2,723 850 - 3,900 cells/uL   WBC mixed population 846 200 - 950 cells/uL   Eosinophils Absolute 365 15 - 500 cells/uL   Basophils Absolute 27 0 - 200 cells/uL   Neutrophils Relative % 55.5 %   Total Lymphocyte 30.6 %   Monocytes Relative 9.5 %   Eosinophils Relative 4.1 %   Basophils Relative 0.3 %  Hemoglobin A1c  Result Value Ref Range   Hgb A1c MFr Bld 5.4 <5.7 % of total Hgb   Mean Plasma Glucose 108 (calc)   eAG (mmol/L) 6.0 (calc)  Lipid panel  Result Value Ref Range   Cholesterol 205 (H) <200 mg/dL   HDL 76 >50 mg/dL   Triglycerides 99 <150 mg/dL   LDL Cholesterol (Calc) 110 (H) mg/dL (calc)   Total CHOL/HDL Ratio 2.7 <5.0 (calc)   Non-HDL Cholesterol (Calc) 129 <130 mg/dL (calc)  COMPLETE METABOLIC PANEL WITH GFR  Result Value Ref Range   Glucose, Bld 103 65 - 139 mg/dL   BUN 16 7 - 25 mg/dL   Creat 1.08 (H) 0.60 - 0.93 mg/dL   GFR, Est Non African American 50 (L) > OR = 60 mL/min/1.47m2   GFR, Est African American 58 (L) > OR = 60 mL/min/1.12m2   BUN/Creatinine Ratio 15 6 - 22 (calc)   Sodium 133 (L) 135 - 146 mmol/L   Potassium 4.1 3.5 - 5.3 mmol/L   Chloride 95 (L) 98 - 110 mmol/L   CO2 29 20 - 32 mmol/L   Calcium 9.4 8.6 - 10.4 mg/dL   Total Protein 7.6 6.1 - 8.1 g/dL   Albumin 3.8 3.6 - 5.1 g/dL   Globulin 3.8 (H) 1.9 - 3.7 g/dL (calc)   AG Ratio 1.0 1.0 - 2.5 (calc)   Total Bilirubin 0.5 0.2 - 1.2 mg/dL   Alkaline phosphatase (APISO) 87 33 - 130 U/L   AST 17 10 - 35 U/L   ALT 11 6 - 29 U/L      Assessment & Plan:   Problem List Items Addressed This Visit    GERD (gastroesophageal reflux disease)    Previously followed by [facility] Prior  EGD 2018 On H2 blocker with refractory symptoms Repeat trial back on  daily low dose PPI Omeprazole      Relevant Medications   omeprazole (PRILOSEC) 20 MG capsule   Hypertension    Mild elevated BP, repeat manual - Home BP readings slightly elevated Elevated Cr w/ CKD II-III    Plan:  1. Continue current BP regimen - Losartan 50mg  daily, HCTZ 25mg  daily - agree to avoid dose change, will treat sleep first 2. Encourage improved lifestyle - low sodium diet, regular exercise as planned water aerobics / walking 3. Continue monitor BP outside office, bring readings to next visit, if persistently >140/90 or new symptoms notify office sooner 4. Follow-up 3 mo      Insomnia - Primary    Limited improvement on top dose Trazodone Never started mirtazapine Multifactorial chronic problem w/ pain and some stress/anxiety Failed SSRI Lexapro  Plan Already off Trazodone, not need to taper off Start Ambien 5mg  nightly PRN - may take regularly at first to reset sleep cycle then PRN Recommend go to bed later, less sleep req and likely not as tired earlier Review sleep hygiene Follow-up      Relevant Medications   zolpidem (AMBIEN) 5 MG tablet      Meds ordered this encounter  Medications  . zolpidem (AMBIEN) 5 MG tablet    Sig: Take 1 tablet (5 mg total) by mouth at bedtime as needed for sleep.    Dispense:  30 tablet    Refill:  2  . omeprazole (PRILOSEC) 20 MG capsule    Sig: Take 1 capsule (20 mg total) by mouth daily before breakfast.    Dispense:  90 capsule    Refill:  1    Follow up plan: Return in about 3 months (around 04/07/2018) for Annual Physical.  Future labs ordered for 04/07/18  [name], [facility] 01/05/2018, 11:03 AM

## 2018-01-05 NOTE — Patient Instructions (Addendum)
Thank you for coming to the office today.  For sleep / insomnia  Start Ambien 5mg  nightly as needed - do not mix with alcohol or Hydrocodone. May take it only as needed - start with nightly a few nights and then can scale back as needed.  May taper down on Trazodone - cut tablet in HALF and take alternating dose - HALF tab one day and next day take 1 whole tab for 1 week, then REDUCE to HALF tablet every day for 1 week then stop.  BP is mild elevated, it has improved - keep checking it let me know if it is too elevated >140/90 consistently we can adjust med  DUE for FASTING BLOOD WORK (no food or drink after midnight before the lab appointment, only water or coffee without cream/sugar on the morning of)  SCHEDULE "Lab Only" visit in the morning at the clinic for lab draw in 3 MONTHS   - Make sure Lab Only appointment is at about 1 week before your next appointment, so that results will be available  For Lab Results, once available within 2-3 days of blood draw, you can can log in to MyChart online to view your results and a brief explanation. Also, we can discuss results at next follow-up visit.   Please schedule a Follow-up Appointment to: Return in about 3 months (around 04/07/2018) for Annual Physical.  If you have any other questions or concerns, please feel free to call the office or send a message through [place]. You may also schedule an earlier appointment if necessary.  Additionally, you may be receiving a survey about your experience at our office within a few days to 1 week by e-mail or mail. We value your feedback.  [name], DO [place]

## 2018-01-05 NOTE — Progress Notes (Signed)
Subjective:   Elizabeth Bailey is a 77 y.o. female who presents for Medicare Annual (Subsequent) preventive examination.  Last AWV-04/06/2014    Objective:     Vitals: BP 138/68 (BP Location: Left Arm, Patient Position: Sitting)   Pulse 89   Temp 98.1 F (36.7 C) (Oral)   Ht 5\' 3"  (1.6 m)   Wt 139 lb (63 kg)   SpO2 96%   BMI 24.62 kg/m   Body mass index is 24.62 kg/m.  Advanced Directives 01/05/2018 08/12/2017 08/06/2016 05/05/2016 04/06/2016 03/12/2016 02/24/2016  Does Patient Have a Medical Advance Directive? Yes No No No;Yes Yes No No  Type of Paramedic of [place];Living will - - [place];Living will [place];Living will - -  Does patient want to make changes to medical advance directive? No - Patient declined - - - - - -  Copy of [place] in Chart? No - copy requested - - Yes No - copy requested - -  Would patient like information on creating a medical advance directive? - No - Patient declined No - Patient declined No - Patient declined - No - Patient declined No - Patient declined    Tobacco Social History   Tobacco Use  Smoking Status Former Smoker  . Packs/day: 0.50  . Years: 10.00  . Pack years: 5.00  . Types: Cigarettes  . Last attempt to quit: 1988  . Years since quitting: 31.8  Smokeless Tobacco Never Used  Tobacco Comment   quit 1988     Counseling given: Not Answered Comment: quit 1988   Clinical Intake:  Pre-visit preparation completed: No  Pain : No/denies pain     Diabetes: No  How often do you need to have someone help you when you read instructions, pamphlets, or other written materials from your doctor or pharmacy?: 1 - Never What is the last grade level you completed in school?: [place]?: No  Information entered by :: Tyson Dense, RN  Past Medical History:  Diagnosis Date  . Arthritis   . Asthma   . Breast cancer ([place]) 2018  .  Cancer ([place]) 03/27/2016   Invasive mammary carcinoma right breast Stage 1 ER/PR positive  . Chronic bronchitis ([place])   . Collagen vascular disease ([place])   . Depression   . Diverticulosis   . GERD (gastroesophageal reflux disease)   . High cholesterol   . Hypertension   . Personal history of radiation therapy   . Seasonal allergies    Past Surgical History:  Procedure Laterality Date  . ABDOMINAL HYSTERECTOMY    . BREAST BIOPSY  1970's  . BREAST BIOPSY Right 03/27/2016   INVASIVE MAMMARY CARCINOMA  . BREAST LUMPECTOMY Right 04/09/2016   radiation  . BREAST LUMPECTOMY WITH SENTINEL LYMPH NODE BIOPSY Right 04/09/2016   Procedure: BREAST LUMPECTOMY WITH SENTINEL LYMPH NODE BX;  Surgeon: Christene Lye, MD;  Location: [facility] ORS;  Service: General;  Laterality: Right;  . CAROTID ENDARTERECTOMY Left   . CHOLECYSTECTOMY    . COLONOSCOPY  2005  . DG  BONE DENSITY ([place] HX)  2010  . DIAGNOSTIC MAMMOGRAM  2015  . ESOPHAGOGASTRODUODENOSCOPY (EGD) WITH PROPOFOL N/A 05/05/2016   Procedure: ESOPHAGOGASTRODUODENOSCOPY (EGD) WITH PROPOFOL;  Surgeon: Lucilla Lame, MD;  Location: [facility] ENDOSCOPY;  Service: Endoscopy;  Laterality: N/A;  . EUS N/A 03/12/2016   Procedure: FULL UPPER ENDOSCOPIC ULTRASOUND (EUS) RADIAL;  Surgeon: Holly Bodily, MD;  Location: [facility] ENDOSCOPY;  Service: Endoscopy;  Laterality: N/A;  . GALLBLADDER SURGERY    . PANCREAS SURGERY  01/24/2016   Dr Burt Knack  . pap smear  2014  . REPAIR OF PERFORATED ULCER N/A 01/24/2016   Procedure: Exploratory Laparotomy with biopsy of Pancreas;  Surgeon: Florene Glen, MD;  Location: [facility] ORS;  Service: General;  Laterality: N/A;  . UPPER GI ENDOSCOPY  03/12/2016   Dr Mont Dutton   Family History  Problem Relation Age of Onset  . Pneumonia Mother   . Depression Mother   . Depression Sister   . Breast cancer Sister 51  . Diabetes Sister   . Stroke Brother   . Heart disease Brother    Social History   Socioeconomic History    . Marital status: Widowed    Spouse name: Not on file  . Number of children: Not on file  . Years of education: Not on file  . Highest education level: Not on file  Occupational History  . Not on file  Social Needs  . Financial resource strain: Not hard at all  . Food insecurity:    Worry: Never true    Inability: Never true  . Transportation needs:    Medical: No    Non-medical: No  Tobacco Use  . Smoking status: Former Smoker    Packs/day: 0.50    Years: 10.00    Pack years: 5.00    Types: Cigarettes    Last attempt to quit: 1988    Years since quitting: 31.8  . Smokeless tobacco: Never Used  . Tobacco comment: quit 1988  Substance and Sexual Activity  . Alcohol use: No    Alcohol/week: 0.0 standard drinks    Comment: wine twice a month   . Drug use: No  . Sexual activity: Never  Lifestyle  . Physical activity:    Days per week: 7 days    Minutes per session: 30 min  . Stress: Not at all  Relationships  . Social connections:    Talks on phone: More than three times a week    Gets together: More than three times a week    Attends religious service: More than 4 times per year    Active member of club or organization: No    Attends meetings of clubs or organizations: Never    Relationship status: Widowed  Other Topics Concern  . Not on file  Social History Narrative  . Not on file    Outpatient Encounter Medications as of 01/05/2018  Medication Sig  . acetaminophen (TYLENOL) 325 MG tablet Take 650 mg by mouth every 6 (six) hours as needed for mild pain.  . Adalimumab (HUMIRA PEN) 40 MG/0.8ML PNKT Inject 40 mg into the skin every 21 ( twenty-one) days. TAKES EVERY OTHER WEEK  LAST DOSE WAS IN 01-2016  . albuterol (PROVENTIL HFA;VENTOLIN HFA) 108 (90 Base) MCG/ACT inhaler Inhale 2 puffs into the lungs every 6 (six) hours as needed for wheezing or shortness of breath.  Marland Kitchen amLODipine (NORVASC) 5 MG tablet Take by mouth.  . budesonide-formoterol (SYMBICORT) 160-4.5  MCG/ACT inhaler Inhale 1 puff into the lungs 2 (two) times daily.  . calcium-vitamin D (OSCAL WITH D) 500-200 MG-UNIT TABS tablet Take by mouth.  . diclofenac sodium (VOLTAREN) 1 % GEL Apply 3 grams topically qid prn  . diphenhydramine-acetaminophen (TYLENOL PM) 25-500 MG TABS tablet Take 1-2 tablets by mouth at bedtime as needed (SLEEP). 1-2 TABLETS DEPENDING ON DIFFICULTLY SLEEPING  . feeding supplement (BOOST / RESOURCE  BREEZE) LIQD Take 1 Container by mouth 3 (three) times daily between meals. (Patient taking differently: Take 1 Container by mouth daily. )  . fluticasone (FLONASE) 50 MCG/ACT nasal spray Place 2 sprays into both nostrils daily. Use for 4-6 weeks then stop and use seasonally or as needed.  . folic acid (FOLVITE) 1 MG tablet Take by mouth.  . gabapentin (NEURONTIN) 300 MG capsule TAKE 1 CAPSULE EVERY MORNING, 1 AT [facility] AT DINNER AND 2 CAPSULES ATBEDTIME  . hydrochlorothiazide (HYDRODIURIL) 25 MG tablet Take 1 tablet (25 mg total) by mouth daily.  Marland Kitchen HYDROcodone-acetaminophen (NORCO/VICODIN) 5-325 MG tablet   . letrozole (FEMARA) 2.5 MG tablet Take 1 tablet (2.5 mg total) by mouth daily.  Marland Kitchen losartan (COZAAR) 50 MG tablet Take 1 tablet (50 mg total) by mouth daily.  . magnesium sulfate 50 % injection Take by mouth.  . montelukast (SINGULAIR) 10 MG tablet TAKE 1 TABLET BY MOUTH ONCE DAILY  . Multiple Vitamin (MULTI-VITAMINS) TABS Take by mouth.  . Omega 3 1000 MG CAPS Take 1,000 mg by mouth daily. omega-3 fatty acids-vitamin E (FISH OIL) 1,000 mg  . simvastatin (ZOCOR) 20 MG tablet Take 1 tablet every other night.  . Tdap (BOOSTRIX) 5-2.5-18.5 LF-MCG/0.5 injection Inject 0.5 mLs into the muscle once for 1 dose.  Marland Kitchen Zoster Vaccine Adjuvanted [facility]) injection Inject 0.5 mLs into the muscle once for 1 dose.  . [DISCONTINUED] Tdap (BOOSTRIX) 5-2.5-18.5 LF-MCG/0.5 injection Inject 0.5 mLs into the muscle once.  . [DISCONTINUED] Zoster Vaccine Adjuvanted [facility]) injection Inject  0.5 mLs into the muscle once.  . ALBUTEROL IN Inhale 2 Inhalers into the lungs every 6 (six) hours as needed.  Marland Kitchen letrozole (FEMARA) 2.5 MG tablet Take 1 tablet (2.5 mg total) by mouth daily.  . Melatonin 10 MG TABS Take 2 tablets by mouth at bedtime. Reported on 06/03/2015  . omega-3 acid ethyl esters (LOVAZA) 1 g capsule Take by mouth.  . ranitidine (ZANTAC) 150 MG tablet Take 1 tablet (150 mg total) 2 (two) times daily by mouth. For up to 2-3 months, then gradually taper off  . traZODone (DESYREL) 150 MG tablet Take 1 tablet (150 mg total) by mouth at bedtime.   No facility-administered encounter medications on file as of 01/05/2018.     Activities of Daily Living In your present state of health, do you have any difficulty performing the following activities: 01/05/2018 07/07/2017  Hearing? N Y  Vision? N N  Difficulty concentrating or making decisions? N N  Walking or climbing stairs? N Y  Dressing or bathing? N N  Doing errands, shopping? N N  Preparing Food and eating ? N -  Using the Toilet? N -  In the past six months, have you accidently leaked urine? N -  Do you have problems with loss of bowel control? N -  Managing your Medications? N -  Managing your Finances? N -  Housekeeping or managing your Housekeeping? N -  Some recent data might be hidden    Patient Care Team: Olin Hauser, DO as PCP - General (Family Medicine) Luan Pulling Ronelle Nigh., MD (Family Medicine) Christene Lye, MD (General Surgery)    Assessment:   This is a routine wellness examination for Rhylynn.  Exercise Activities and Dietary recommendations Current Exercise Habits: Home exercise routine, Type of exercise: walking, Time (Minutes): 35, Frequency (Times/Week): 7, Weekly Exercise (Minutes/Week): 245, Intensity: Mild, Exercise limited by: None identified  Goals   None     Fall Risk  Fall Risk  01/05/2018 08/12/2017 07/07/2017 03/30/2017 02/11/2017  Falls in the past year? 0 No No No  No  Number falls in past yr: 0 - - - -  Injury with Fall? 0 - - - -   Is the patient's home free of loose throw rugs in walkways, pet beds, electrical cords, etc?   yes      Grab bars in the bathroom? yes      Handrails on the stairs?   yes      Adequate lighting?   yes  Depression Screen PHQ 2/9 Scores 01/05/2018 08/12/2017 07/07/2017 03/30/2017  PHQ - 2 Score 0 0 0 0  PHQ- 9 Score - - - 4     Cognitive Function     6CIT Screen 01/05/2018  What Year? 0 points  What month? 0 points  What time? 0 points  Count back from 20 0 points  Months in reverse 0 points  Repeat phrase 4 points  Total Score 4    Immunization History  Administered Date(s) Administered  . Influenza, High Dose Seasonal PF 11/28/2014, 12/05/2015, 12/08/2017  . Influenza-Unspecified 12/04/2016  . Pneumococcal Conjugate-13 01/09/2015  . Pneumococcal-Unspecified 11/28/2011    Qualifies for Shingles Vaccine? Yes, educated and ordered to pharmacy  Screening Tests Health Maintenance  Topic Date Due  . Samul Dada  10/19/2017  . INFLUENZA VACCINE  Completed  . DEXA SCAN  Completed  . PNA vac Low Risk Adult  Completed    Cancer Screenings: Lung: Low Dose CT Chest recommended if Age 28-80 years, 30 pack-year currently smoking OR have quit w/in 15years. Patient does not qualify. Breast:  Up to date on Mammogram? Yes   Up to date of Bone Density/Dexa? Yes Colorectal: up to date  Additional Screenings:  Hepatitis C Screening: declined TDAP due: ordered to pharmacy     Plan:    I have personally reviewed and addressed the Medicare Annual Wellness questionnaire and have noted the following in the patient's chart:  A. Medical and social history B. Use of alcohol, tobacco or illicit drugs  C. Current medications and supplements D. Functional ability and status E.  Nutritional status F.  Physical activity G. Advance directives H. List of other physicians I.  Hospitalizations, surgeries, and ER visits in  previous 12 months J.  [place] to include hearing, vision, cognitive, depression L. Referrals and appointments - none  In addition, I have reviewed and discussed with patient certain preventive protocols, quality metrics, and best practice recommendations. A written personalized care plan for preventive services as well as general preventive health recommendations were provided to patient.  See attached scanned questionnaire for additional information.   Signed,   Tyson Dense, RN Nurse Health Advisor  Patient Concerns: discontinued zantac and trazodone because they didn't work for her. Wondering if there was something else she could replace them with for acid refulx and trouble sleeping

## 2018-01-05 NOTE — Patient Instructions (Signed)
Elizabeth Bailey , Thank you for taking time to come for your Medicare Wellness Visit. I appreciate your ongoing commitment to your health goals. Please review the following plan we discussed and let me know if I can assist you in the future.   Screening recommendations/referrals: Colonoscopy excluded, over age 76 Mammogram excluded, over age 65 Bone Density up to date Recommended yearly ophthalmology/optometry visit for glaucoma screening and checkup Recommended yearly dental visit for hygiene and checkup  Vaccinations: Influenza vaccine up to date Pneumococcal vaccine up to date, completed Tdap vaccine due-ordered to pharmacy Shingles vaccine due- ordered to pharmacy    Advanced directives: Please bring [place] a copy of your living will and health care power of attorney  Conditions/risks identified: none  Next appointment: Please schedule next Medicare Wellness visit for next Nov   Preventive Care 65 Years and Older, Female Preventive care refers to lifestyle choices and visits with your health care provider that can promote health and wellness. What does preventive care include?  A yearly physical exam. This is also called an annual well check.  Dental exams once or twice a year.  Routine eye exams. Ask your health care provider how often you should have your eyes checked.  Personal lifestyle choices, including:  Daily care of your teeth and gums.  Regular physical activity.  Eating a healthy diet.  Avoiding tobacco and drug use.  Limiting alcohol use.  Practicing safe sex.  Taking low-dose aspirin every day.  Taking vitamin and mineral supplements as recommended by your health care provider. What happens during an annual well check? The services and screenings done by your health care provider during your annual well check will depend on your age, overall health, lifestyle risk factors, and family history of disease. Counseling  Your health care provider may ask you  questions about your:  Alcohol use.  Tobacco use.  Drug use.  Emotional well-being.  Home and relationship well-being.  Sexual activity.  Eating habits.  History of falls.  Memory and ability to understand (cognition).  Work and work Statistician.  Reproductive health. Screening  You may have the following tests or measurements:  Height, weight, and BMI.  Blood pressure.  Lipid and cholesterol levels. These may be checked every 5 years, or more frequently if you are over 42 years old.  Skin check.  Lung cancer screening. You may have this screening every year starting at age 70 if you have a 30-pack-year history of smoking and currently smoke or have quit within the past 15 years.  Fecal occult blood test (FOBT) of the stool. You may have this test every year starting at age 63.  Flexible sigmoidoscopy or colonoscopy. You may have a sigmoidoscopy every 5 years or a colonoscopy every 10 years starting at age 103.  Hepatitis C blood test.  Hepatitis B blood test.  Sexually transmitted disease (STD) testing.  Diabetes screening. This is done by checking your blood sugar (glucose) after you have not eaten for a while (fasting). You may have this done every 1-3 years.  Bone density scan. This is done to screen for osteoporosis. You may have this done starting at age 13.  Mammogram. This may be done every 1-2 years. Talk to your health care provider about how often you should have regular mammograms. Talk with your health care provider about your test results, treatment options, and if necessary, the need for more tests. Vaccines  Your health care provider may recommend certain vaccines, such as:  Influenza vaccine.  This is recommended every year.  Tetanus, diphtheria, and acellular pertussis (Tdap, Td) vaccine. You may need a Td booster every 10 years.  Zoster vaccine. You may need this after age 50.  Pneumococcal 13-valent conjugate (PCV13) vaccine. One dose is  recommended after age 79.  Pneumococcal polysaccharide (PPSV23) vaccine. One dose is recommended after age 75. Talk to your health care provider about which screenings and vaccines you need and how often you need them. This information is not intended to replace advice given to you by your health care provider. Make sure you discuss any questions you have with your health care provider. Document Released: 03/15/2015 Document Revised: 11/06/2015 Document Reviewed: 12/18/2014 Elsevier Interactive Patient Education  2017 [employer] Prevention in the Home Falls can cause injuries. They can happen to people of all ages. There are many things you can do to make your home safe and to help prevent falls. What can I do on the outside of my home?  Regularly fix the edges of walkways and driveways and fix any cracks.  Remove anything that might make you trip as you walk through a door, such as a raised step or threshold.  Trim any bushes or trees on the path to your home.  Use bright outdoor lighting.  Clear any walking paths of anything that might make someone trip, such as rocks or tools.  Regularly check to see if handrails are loose or broken. Make sure that both sides of any steps have handrails.  Any raised decks and porches should have guardrails on the edges.  Have any leaves, snow, or ice cleared regularly.  Use sand or salt on walking paths during winter.  Clean up any spills in your garage right away. This includes oil or grease spills. What can I do in the bathroom?  Use night lights.  Install grab bars by the toilet and in the tub and shower. Do not use towel bars as grab bars.  Use non-skid mats or decals in the tub or shower.  If you need to sit down in the shower, use a plastic, non-slip stool.  Keep the floor dry. Clean up any water that spills on the floor as soon as it happens.  Remove soap buildup in the tub or shower regularly.  Attach bath mats  securely with double-sided non-slip rug tape.  Do not have throw rugs and other things on the floor that can make you trip. What can I do in the bedroom?  Use night lights.  Make sure that you have a light by your bed that is easy to reach.  Do not use any sheets or blankets that are too big for your bed. They should not hang down onto the floor.  Have a firm chair that has side arms. You can use this for support while you get dressed.  Do not have throw rugs and other things on the floor that can make you trip. What can I do in the kitchen?  Clean up any spills right away.  Avoid walking on wet floors.  Keep items that you use a lot in easy-to-reach places.  If you need to reach something above you, use a strong step stool that has a grab bar.  Keep electrical cords out of the way.  Do not use floor polish or wax that makes floors slippery. If you must use wax, use non-skid floor wax.  Do not have throw rugs and other things on the floor that can make  you trip. What can I do with my stairs?  Do not leave any items on the stairs.  Make sure that there are handrails on both sides of the stairs and use them. Fix handrails that are broken or loose. Make sure that handrails are as long as the stairways.  Check any carpeting to make sure that it is firmly attached to the stairs. Fix any carpet that is loose or worn.  Avoid having throw rugs at the top or bottom of the stairs. If you do have throw rugs, attach them to the floor with carpet tape.  Make sure that you have a light switch at the top of the stairs and the bottom of the stairs. If you do not have them, ask someone to add them for you. What else can I do to help prevent falls?  Wear shoes that:  Do not have high heels.  Have rubber bottoms.  Are comfortable and fit you well.  Are closed at the toe. Do not wear sandals.  If you use a stepladder:  Make sure that it is fully opened. Do not climb a closed  stepladder.  Make sure that both sides of the stepladder are locked into place.  Ask someone to hold it for you, if possible.  Clearly mark and make sure that you can see:  Any grab bars or handrails.  First and last steps.  Where the edge of each step is.  Use tools that help you move around (mobility aids) if they are needed. These include:  Canes.  Walkers.  Scooters.  Crutches.  Turn on the lights when you go into a dark area. Replace any light bulbs as soon as they burn out.  Set up your furniture so you have a clear path. Avoid moving your furniture around.  If any of your floors are uneven, fix them.  If there are any pets around you, be aware of where they are.  Review your medicines with your doctor. Some medicines can make you feel dizzy. This can increase your chance of falling. Ask your doctor what other things that you can do to help prevent falls. This information is not intended to replace advice given to you by your health care provider. Make sure you discuss any questions you have with your health care provider. Document Released: 12/13/2008 Document Revised: 07/25/2015 Document Reviewed: 03/23/2014 Elsevier Interactive Patient Education  2017 [employer].

## 2018-01-06 ENCOUNTER — Other Ambulatory Visit: Payer: Self-pay | Admitting: Family Medicine

## 2018-01-06 DIAGNOSIS — M0579 Rheumatoid arthritis with rheumatoid factor of multiple sites without organ or systems involvement: Secondary | ICD-10-CM

## 2018-01-06 DIAGNOSIS — E782 Mixed hyperlipidemia: Secondary | ICD-10-CM

## 2018-01-06 DIAGNOSIS — J431 Panlobular emphysema: Secondary | ICD-10-CM

## 2018-01-06 DIAGNOSIS — R7309 Other abnormal glucose: Secondary | ICD-10-CM

## 2018-01-06 DIAGNOSIS — I739 Peripheral vascular disease, unspecified: Secondary | ICD-10-CM

## 2018-01-06 DIAGNOSIS — F5101 Primary insomnia: Secondary | ICD-10-CM

## 2018-01-06 DIAGNOSIS — I10 Essential (primary) hypertension: Secondary | ICD-10-CM

## 2018-01-06 DIAGNOSIS — K219 Gastro-esophageal reflux disease without esophagitis: Secondary | ICD-10-CM

## 2018-01-06 DIAGNOSIS — Z0000 Encounter for general adult medical examination without abnormal findings: Secondary | ICD-10-CM

## 2018-01-06 NOTE — Assessment & Plan Note (Signed)
Previously followed by [facility] Prior EGD 2018 On H2 blocker with refractory symptoms Repeat trial back on daily low dose PPI Omeprazole

## 2018-01-06 NOTE — Assessment & Plan Note (Signed)
Limited improvement on top dose Trazodone Never started mirtazapine Multifactorial chronic problem w/ pain and some stress/anxiety Failed SSRI Lexapro  Plan Already off Trazodone, not need to taper off Start Ambien 5mg  nightly PRN - may take regularly at first to reset sleep cycle then PRN Recommend go to bed later, less sleep req and likely not as tired earlier Review sleep hygiene Follow-up

## 2018-01-06 NOTE — Assessment & Plan Note (Signed)
Mild elevated BP, repeat manual - Home BP readings slightly elevated Elevated Cr w/ CKD II-III    Plan:  1. Continue current BP regimen - Losartan 50mg  daily, HCTZ 25mg  daily - agree to avoid dose change, will treat sleep first 2. Encourage improved lifestyle - low sodium diet, regular exercise as planned water aerobics / walking 3. Continue monitor BP outside office, bring readings to next visit, if persistently >140/90 or new symptoms notify office sooner 4. Follow-up 3 mo

## 2018-01-07 ENCOUNTER — Ambulatory Visit: Payer: Medicare Other | Admitting: Family Medicine

## 2018-01-16 ENCOUNTER — Emergency Department
Admission: EM | Admit: 2018-01-16 | Discharge: 2018-01-16 | Disposition: A | Discharge status: Home / Self Care | Payer: Medicare Other | Attending: Emergency Medicine | Admitting: Emergency Medicine

## 2018-01-16 ENCOUNTER — Emergency Department: Payer: Medicare Other

## 2018-01-16 ENCOUNTER — Other Ambulatory Visit: Payer: Self-pay

## 2018-01-16 DIAGNOSIS — I10 Essential (primary) hypertension: Secondary | ICD-10-CM | POA: Diagnosis not present

## 2018-01-16 DIAGNOSIS — Z853 Personal history of malignant neoplasm of breast: Secondary | ICD-10-CM | POA: Diagnosis not present

## 2018-01-16 DIAGNOSIS — Y9389 Activity, other specified: Secondary | ICD-10-CM | POA: Insufficient documentation

## 2018-01-16 DIAGNOSIS — J449 Chronic obstructive pulmonary disease, unspecified: Secondary | ICD-10-CM | POA: Diagnosis not present

## 2018-01-16 DIAGNOSIS — Y998 Other external cause status: Secondary | ICD-10-CM | POA: Diagnosis not present

## 2018-01-16 DIAGNOSIS — S2243XA Multiple fractures of ribs, bilateral, initial encounter for closed fracture: Secondary | ICD-10-CM

## 2018-01-16 DIAGNOSIS — S299XXA Unspecified injury of thorax, initial encounter: Secondary | ICD-10-CM | POA: Diagnosis present

## 2018-01-16 DIAGNOSIS — R55 Syncope and collapse: Secondary | ICD-10-CM | POA: Diagnosis not present

## 2018-01-16 DIAGNOSIS — Y92410 Unspecified street and highway as the place of occurrence of the external cause: Secondary | ICD-10-CM | POA: Insufficient documentation

## 2018-01-16 DIAGNOSIS — X19XXXA Contact with other heat and hot substances, initial encounter: Secondary | ICD-10-CM | POA: Insufficient documentation

## 2018-01-16 DIAGNOSIS — W2210XA Striking against or struck by unspecified automobile airbag, initial encounter: Secondary | ICD-10-CM | POA: Insufficient documentation

## 2018-01-16 DIAGNOSIS — Z79899 Other long term (current) drug therapy: Secondary | ICD-10-CM | POA: Diagnosis not present

## 2018-01-16 DIAGNOSIS — N390 Urinary tract infection, site not specified: Secondary | ICD-10-CM | POA: Insufficient documentation

## 2018-01-16 DIAGNOSIS — T23172A Burn of first degree of left wrist, initial encounter: Secondary | ICD-10-CM | POA: Insufficient documentation

## 2018-01-16 DIAGNOSIS — Z923 Personal history of irradiation: Secondary | ICD-10-CM | POA: Diagnosis not present

## 2018-01-16 DIAGNOSIS — Z87891 Personal history of nicotine dependence: Secondary | ICD-10-CM | POA: Insufficient documentation

## 2018-01-16 DIAGNOSIS — R197 Diarrhea, unspecified: Secondary | ICD-10-CM | POA: Insufficient documentation

## 2018-01-16 LAB — CBC WITH DIFFERENTIAL/PLATELET
ABS IMMATURE GRANULOCYTES: 0.05 10*3/uL (ref 0.00–0.07)
BASOS PCT: 0 %
Basophils Absolute: 0 10*3/uL (ref 0.0–0.1)
EOS ABS: 0.2 10*3/uL (ref 0.0–0.5)
Eosinophils Relative: 2 %
HEMATOCRIT: 39.4 % (ref 36.0–46.0)
Hemoglobin: 12.9 g/dL (ref 12.0–15.0)
Immature Granulocytes: 1 %
Lymphocytes Relative: 34 %
Lymphs Abs: 3.3 10*3/uL (ref 0.7–4.0)
MCH: 32.7 pg (ref 26.0–34.0)
MCHC: 32.7 g/dL (ref 30.0–36.0)
MCV: 100 fL (ref 80.0–100.0)
MONO ABS: 0.7 10*3/uL (ref 0.1–1.0)
MONOS PCT: 7 %
Neutro Abs: 5.5 10*3/uL (ref 1.7–7.7)
Neutrophils Relative %: 56 %
PLATELETS: 394 10*3/uL (ref 150–400)
RBC: 3.94 MIL/uL (ref 3.87–5.11)
RDW: 11.9 % (ref 11.5–15.5)
WBC: 9.8 10*3/uL (ref 4.0–10.5)
nRBC: 0 % (ref 0.0–0.2)

## 2018-01-16 LAB — URINALYSIS, COMPLETE (UACMP) WITH MICROSCOPIC
BILIRUBIN URINE: NEGATIVE
Glucose, UA: NEGATIVE mg/dL
Ketones, ur: NEGATIVE mg/dL
Nitrite: NEGATIVE
PH: 5 (ref 5.0–8.0)
Protein, ur: NEGATIVE mg/dL
SPECIFIC GRAVITY, URINE: 1.021 (ref 1.005–1.030)
WBC, UA: >50 WBC/hpf — ABNORMAL HIGH (ref 0–5)

## 2018-01-16 LAB — COMPREHENSIVE METABOLIC PANEL
ALT: 13 U/L (ref 0–44)
AST: 29 U/L (ref 15–41)
Albumin: 3.7 g/dL (ref 3.5–5.0)
Alkaline Phosphatase: 60 U/L (ref 38–126)
Anion gap: 11 (ref 5–15)
BILIRUBIN TOTAL: 1.1 mg/dL (ref 0.3–1.2)
BUN: 14 mg/dL (ref 8–23)
CO2: 27 mmol/L (ref 22–32)
CREATININE: 1.07 mg/dL — AB (ref 0.44–1.00)
Calcium: 9.2 mg/dL (ref 8.9–10.3)
Chloride: 95 mmol/L — ABNORMAL LOW (ref 98–111)
GFR, EST AFRICAN AMERICAN: 57 mL/min — AB (ref >60)
GFR, EST NON AFRICAN AMERICAN: 49 mL/min — AB (ref >60)
Glucose, Bld: 129 mg/dL — ABNORMAL HIGH (ref 70–99)
Potassium: 3.4 mmol/L — ABNORMAL LOW (ref 3.5–5.1)
Sodium: 133 mmol/L — ABNORMAL LOW (ref 135–145)
TOTAL PROTEIN: 7.7 g/dL (ref 6.5–8.1)

## 2018-01-16 LAB — TROPONIN I

## 2018-01-16 MED ORDER — CEPHALEXIN 500 MG PO CAPS: 500.0000 mg | ORAL_CAPSULE | Freq: Three times a day (TID) | ORAL | 0 refills | Status: DC

## 2018-01-16 MED ORDER — TRAMADOL HCL 50 MG PO TABS
50.0000 mg | ORAL_TABLET | Freq: Once | ORAL | Status: AC
  Administered 2018-01-16: 50 mg via ORAL
  Filled 2018-01-16: qty 1

## 2018-01-16 MED ORDER — SODIUM CHLORIDE 0.9 % IV BOLUS
1000.0000 mL | Freq: Once | INTRAVENOUS | Status: AC
  Administered 2018-01-16: 1000 mL via INTRAVENOUS

## 2018-01-16 MED ORDER — SODIUM CHLORIDE 0.9 % IV SOLN
1.0000 g | Freq: Once | INTRAVENOUS | Status: AC
  Administered 2018-01-16: 1 g via INTRAVENOUS
  Filled 2018-01-16: qty 10

## 2018-01-16 MED ORDER — IOHEXOL 350 MG/ML SOLN
75.0000 mL | Freq: Once | INTRAVENOUS | Status: AC | PRN
  Administered 2018-01-16: 75 mL via INTRAVENOUS

## 2018-01-16 MED ORDER — BACITRACIN ZINC 500 UNIT/GM EX OINT: TOPICAL_OINTMENT | CUTANEOUS | 0 refills | Status: AC

## 2018-01-16 NOTE — ED Notes (Signed)
Bruising and abrasion to left wrist
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
## 2018-01-16 NOTE — ED Notes (Signed)
Patient transported to CT 

## 2018-01-16 NOTE — Discharge Instructions (Addendum)
Please seek medical attention for any high fevers, chest pain, shortness of breath, change in behavior, persistent vomiting, bloody stool or any other new or concerning symptoms.  
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
## 2018-01-16 NOTE — ED Provider Notes (Signed)
UA concerning for UTI. Discussed this with the patient. At this point labwork and vital signs not consistent with sepsis. Will plan on giving dose of IV abx while the patient is here in the er. Additionally CT angio showed two rib fractures. Discussed this with the patient. Will give pain medication and trial patient on incentive spirometry. If able to handle incentive spirometry well do think she would be a candidate for discharge.   She was able to perform incentive spirometry well.  She states she did not have significant increase in pain when she did this.  Patient is already on narcotic pain medications at home.  Will plan on discharging with antibiotics.   Nance Pear, MD 01/16/18 [phone]

## 2018-01-18 LAB — URINE CULTURE

## 2018-01-24 ENCOUNTER — Encounter: Payer: Self-pay | Admitting: Family Medicine

## 2018-01-24 ENCOUNTER — Other Ambulatory Visit: Payer: Medicare Other

## 2018-01-24 ENCOUNTER — Ambulatory Visit: Payer: Medicare Other | Admitting: Family Medicine

## 2018-01-24 VITALS — BP 109/83 | HR 65 | Temp 98.3°F | Resp 16 | Ht 63.0 in | Wt 140.0 lb

## 2018-01-24 DIAGNOSIS — T23172A Burn of first degree of left wrist, initial encounter: Secondary | ICD-10-CM

## 2018-01-24 DIAGNOSIS — R55 Syncope and collapse: Secondary | ICD-10-CM | POA: Diagnosis not present

## 2018-01-24 DIAGNOSIS — T23102A Burn of first degree of left hand, unspecified site, initial encounter: Secondary | ICD-10-CM

## 2018-01-24 DIAGNOSIS — S2243XA Multiple fractures of ribs, bilateral, initial encounter for closed fracture: Secondary | ICD-10-CM

## 2018-01-24 DIAGNOSIS — N3000 Acute cystitis without hematuria: Secondary | ICD-10-CM

## 2018-01-24 MED ORDER — HYDROCODONE-ACETAMINOPHEN 5-325 MG PO TABS: 1.0000 | ORAL_TABLET | Freq: Four times a day (QID) | ORAL | 0 refills | Status: DC | PRN
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
## 2018-01-25 ENCOUNTER — Encounter: Payer: Self-pay | Admitting: Family Medicine

## 2018-02-03 ENCOUNTER — Ambulatory Visit: Payer: Medicare Other | Admitting: General Surgery

## 2018-02-09 ENCOUNTER — Ambulatory Visit: Payer: Medicare Other | Admitting: Family Medicine

## 2018-02-09 ENCOUNTER — Encounter: Payer: Self-pay | Admitting: Family Medicine

## 2018-02-09 VITALS — BP 111/61 | HR 78 | Temp 98.1°F | Resp 16 | Ht 63.0 in | Wt 135.0 lb

## 2018-02-09 DIAGNOSIS — K5900 Constipation, unspecified: Secondary | ICD-10-CM

## 2018-02-09 DIAGNOSIS — B379 Candidiasis, unspecified: Secondary | ICD-10-CM

## 2018-02-09 DIAGNOSIS — T3695XA Adverse effect of unspecified systemic antibiotic, initial encounter: Secondary | ICD-10-CM

## 2018-02-09 DIAGNOSIS — K5792 Diverticulitis of intestine, part unspecified, without perforation or abscess without bleeding: Secondary | ICD-10-CM | POA: Diagnosis not present

## 2018-02-09 MED ORDER — FLUCONAZOLE 150 MG PO TABS: ORAL_TABLET | ORAL | 1 refills | Status: DC

## 2018-02-09 MED ORDER — POLYETHYLENE GLYCOL 3350 17 GM/SCOOP PO POWD: 17.0000 g | Freq: Every day | ORAL | 1 refills | Status: DC | PRN

## 2018-02-09 NOTE — Patient Instructions (Addendum)
Thank you for coming to the office today.  For Diverticulitis - finish antibiotics Cipro and Flagyl  For yeast - start Diflucan take one pill today - then repeat after finish antibiotics if still having symptoms  For Constipation (less frequent bowel movement that can be hard dry or involve straining).  Recommend trying OTC Miralax 17g = 1 capful in large glass water once daily for now, try several days to see if working, goal is soft stool or BM 1-2 times daily, if too loose then reduce dose or try every other day. If not effective may need to increase it to 2 doses at once in AM or may do 1 in morning and 1 in afternoon/evening  - This medicine is very safe and can be used often without any problem and will not make you dehydrated. It is good for use on AS NEEDED BASIS or even MAINTENANCE therapy for longer term for several days to weeks at a time to help regulate bowel movements  Other more natural remedies or preventative treatment: - Increase hydration with water - Increase fiber in diet (high fiber foods = vegetables, leafy greens, oats/grains) - May take OTC Fiber supplement (metamucil powder or pill/gummy) - May try OTC Probiotic  ---- We will mail the [facility] paperwork to them tomorrow - stay tuned, you should be okay to drive short distances if feel safe, once this is approved then there should be no restrictions.   Please schedule a Follow-up Appointment to: Return if symptoms worsen or fail to improve, for diverticulitis.  If you have any other questions or concerns, please feel free to call the office or send a message through [place]. You may also schedule an earlier appointment if necessary.  Additionally, you may be receiving a survey about your experience at our office within a few days to 1 week by e-mail or mail. We value your feedback.  [name], DO [place]
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
## 2018-02-18 ENCOUNTER — Ambulatory Visit (INDEPENDENT_AMBULATORY_CARE_PROVIDER_SITE_OTHER): Payer: Medicare Other | Admitting: Nurse Practitioner

## 2018-02-18 ENCOUNTER — Encounter: Payer: Self-pay | Admitting: Nurse Practitioner

## 2018-02-18 ENCOUNTER — Other Ambulatory Visit: Payer: Self-pay

## 2018-02-18 VITALS — BP 116/56 | HR 62 | Temp 98.2°F | Ht 63.0 in | Wt 135.6 lb

## 2018-02-18 DIAGNOSIS — N823 Fistula of vagina to large intestine: Secondary | ICD-10-CM | POA: Diagnosis not present

## 2018-02-18 MED ORDER — ZINC OXIDE 10 % EX CREA: 1.0000 "application " | TOPICAL_CREAM | CUTANEOUS | 2 refills | Status: DC | PRN

## 2018-02-18 NOTE — Progress Notes (Signed)
Subjective:    Patient ID: Elizabeth Bailey, female    DOB: 11-15-1940, 77 y.o.   MRN: [id number]  DORETHA GODING is a 77 y.o. female presenting on 02/18/2018 for Vaginal Discharge (dark brown discharge. x41mth The pt been using [facility]. Pt states its causes her to change her depends every 3 hrs. She also complains of severe vaginal atropic x )  HPI Dark brown vaginal discharge Started initially on 11/17 and has gotten progressively worse.  Has used neosporin and [employer] and is now very "chapped" in perineum.  Patient is concerned most about relief of skin breakdown/diaper rash.   Patient is here today 2/2 advice from her [facility] NP during home assessment.   - Discharge has been very dark brown, fills a depends about every 3 hours.  She notes no significant odor to the discharge.  Social History   Tobacco Use  . Smoking status: Former Smoker    Packs/day: 0.50    Years: 10.00    Pack years: 5.00    Types: Cigarettes    Last attempt to quit: 1988    Years since quitting: 31.9  . Smokeless tobacco: Former Systems developer  . Tobacco comment: quit 1988  Substance Use Topics  . Alcohol use: No    Alcohol/week: 0.0 standard drinks    Comment: wine twice a month   . Drug use: No    Review of Systems Per HPI unless specifically indicated above    Objective:    BP (!) 116/56 (BP Location: Left Arm, Patient Position: Sitting, Cuff Size: Normal)   Pulse 62   Temp 98.2 F (36.8 C) (Oral)   Ht 5\' 3"  (1.6 m)   Wt 135 lb 9.6 oz (61.5 kg)   BMI 24.02 kg/m   Wt Readings from Last 3 Encounters:  02/18/18 135 lb 9.6 oz (61.5 kg)  02/09/18 135 lb (61.2 kg)  01/24/18 140 lb (63.5 kg)    Physical Exam Vitals signs reviewed.  Constitutional:      General: She is not in acute distress.    Appearance: She is well-developed.  HENT:     Head: Normocephalic and atraumatic.  Cardiovascular:     Rate and Rhythm: Normal rate and regular rhythm.     Pulses:          Radial pulses  are 2+ on the right side and 2+ on the left side.       Posterior tibial pulses are 1+ on the right side and 1+ on the left side.     Heart sounds: Normal heart sounds, S1 normal and S2 normal.  Pulmonary:     Effort: Pulmonary effort is normal. No respiratory distress.     Breath sounds: Normal breath sounds and air entry.  Abdominal:     Hernia: There is no hernia in the right inguinal area or left inguinal area.  Genitourinary:    Exam position: Supine.     Pubic Area: Rash (MSAD with erythema, tenderness) present.     Labia:        Right: Rash and tenderness present.        Left: Rash and tenderness present.      Urethra: No prolapse, urethral pain, urethral swelling or urethral lesion.     Vagina: Vaginal discharge (stool is leaking into and from vagina.) present. No bleeding.     Comments: Internal rectal tissue visible through posterior vaginal wall.  Fistula is extending to near opening of vagina, but  is not complete.  Not able to visualize more proximal opening or full size as speculum exam will be difficult today. Musculoskeletal:     Right lower leg: No edema.     Left lower leg: No edema.  Lymphadenopathy:     Lower Body: No right inguinal adenopathy. No left inguinal adenopathy.  Skin:    General: Skin is warm and dry.     Capillary Refill: Capillary refill takes less than 2 seconds.  Neurological:     Mental Status: She is alert and oriented to person, place, and time.  Psychiatric:        Attention and Perception: Attention normal.        Mood and Affect: Mood and affect normal.        Behavior: Behavior normal. Behavior is cooperative.        Thought Content: Thought content normal.        Judgment: Judgment normal.    Results for orders placed or performed during the hospital encounter of 01/16/18  Urine Culture  Result Value Ref Range   Specimen Description      URINE, RANDOM Performed at [facility], [address] [place], [place] [address]     Special Requests      NONE Performed at [facility], [address] [place], [place] [address]    Culture MULTIPLE SPECIES PRESENT, SUGGEST RECOLLECTION (A)    Report Status 01/18/2018 FINAL   CBC with Differential  Result Value Ref Range   WBC 9.8 4.0 - 10.5 K/uL   RBC 3.94 3.87 - 5.11 MIL/uL   Hemoglobin 12.9 12.0 - 15.0 g/dL   HCT 39.4 36.0 - 46.0 %   MCV 100.0 80.0 - 100.0 fL   MCH 32.7 26.0 - 34.0 pg   MCHC 32.7 30.0 - 36.0 g/dL   RDW 11.9 11.5 - 15.5 %   Platelets 394 150 - 400 K/uL   nRBC 0.0 0.0 - 0.2 %   Neutrophils Relative % 56 %   Neutro Abs 5.5 1.7 - 7.7 K/uL   Lymphocytes Relative 34 %   Lymphs Abs 3.3 0.7 - 4.0 K/uL   Monocytes Relative 7 %   Monocytes Absolute 0.7 0.1 - 1.0 K/uL   Eosinophils Relative 2 %   Eosinophils Absolute 0.2 0.0 - 0.5 K/uL   Basophils Relative 0 %   Basophils Absolute 0.0 0.0 - 0.1 K/uL   Immature Granulocytes 1 %   Abs Immature Granulocytes 0.05 0.00 - 0.07 K/uL  Comprehensive metabolic panel  Result Value Ref Range   Sodium 133 (L) 135 - 145 mmol/L   Potassium 3.4 (L) 3.5 - 5.1 mmol/L   Chloride 95 (L) 98 - 111 mmol/L   CO2 27 22 - 32 mmol/L   Glucose, Bld 129 (H) 70 - 99 mg/dL   BUN 14 8 - 23 mg/dL   Creatinine, Ser 1.07 (H) 0.44 - 1.00 mg/dL   Calcium 9.2 8.9 - 10.3 mg/dL   Total Protein 7.7 6.5 - 8.1 g/dL   Albumin 3.7 3.5 - 5.0 g/dL   AST 29 15 - 41 U/L   ALT 13 0 - 44 U/L   Alkaline Phosphatase 60 38 - 126 U/L   Total Bilirubin 1.1 0.3 - 1.2 mg/dL   GFR calc non Af Amer 49 (L) >60 mL/min   GFR calc Af Amer 57 (L) >60 mL/min   Anion gap 11 5 - 15  Troponin I - ONCE - STAT  Result Value  Ref Range   Troponin I <0.03 <0.03 ng/mL  Urinalysis, Complete w Microscopic  Result Value Ref Range   Color, Urine YELLOW (A) YELLOW   APPearance HAZY (A) CLEAR   Specific Gravity, Urine 1.021 1.005 - 1.030   pH 5.0 5.0 - 8.0   Glucose, UA NEGATIVE NEGATIVE mg/dL   Hgb urine dipstick MODERATE (A) NEGATIVE   Bilirubin  Urine NEGATIVE NEGATIVE   Ketones, ur NEGATIVE NEGATIVE mg/dL   Protein, ur NEGATIVE NEGATIVE mg/dL   Nitrite NEGATIVE NEGATIVE   Leukocytes, UA LARGE (A) NEGATIVE   RBC / HPF 0-5 0 - 5 RBC/hpf   WBC, UA >50 (H) 0 - 5 WBC/hpf   Bacteria, UA RARE (A) NONE SEEN   Squamous Epithelial / LPF 0-5 0 - 5   Mucus PRESENT    Hyaline Casts, UA PRESENT   Troponin I - Once-Timed  Result Value Ref Range   Troponin I <0.03 <0.03 ng/mL      Assessment & Plan:   Problem List Items Addressed This Visit    None    Visit Diagnoses    Recto-vaginal fistula    -  Primary   Relevant Medications   ZINC OXIDE, TOPICAL, 10 % CREA   Other Relevant Orders   Ambulatory referral to Obstetrics / Gynecology    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    Now with severe fistula causing constant leakage of stool and skin excoriation. Patient not currently exhibiting emergency signs and symptoms of bleeding or systemic infection.  Plan: 1. Stop using current topical treatments of neosporin and Rawleigh's salve. 2. May use petroleum only ointment for moisture barrier. 3. START using topical zinc oxide cream for skin irritation, MSAD. 4. Follow-up urgently with urogynecology at [facility].  Referral placed for [place] office per patient preference.  - Consulted Dr. Glennon Mac at [facility] while patient in clinic.  He recommends referral to Uro-gyn instead of OB-GYN at this time.    Meds ordered this encounter  Medications  . ZINC OXIDE, TOPICAL, 10 % CREA    Sig: Apply 1 application topically every 3 (three) hours as needed (perineal irritation).    Dispense:  113 g    Refill:  2    Order Specific Question:   Supervising Provider    Answer:   Olin Hauser [[id number]]    Follow up plan: Return if symptoms worsen or fail to improve.  A total of 30 minutes was spent face-to-face with  this patient. Greater than 50% of this time was spent in counseling and coordination of care with the patient.   Cassell Smiles, DNP, AGPCNP-BC Adult Gerontology Primary Care Nurse Practitioner [facility] 02/18/2018, 4:26 PM

## 2018-02-18 NOTE — Patient Instructions (Addendum)
Elizabeth Bailey,   Thank you for coming in to clinic today.  1. Start using a diaper rash cream with zinc oxide every depends change.  2. Change depends every 2 hours at minimum.  May need to be more frequently.  3. If you develop any bloody discharge, fever, chills, sweats or other infection go to ER.   4. You are referred to  [facility] [address] [place], [place] [address] Appointment Line  [phone]  Call us on Monday by lunch for an update and if you have not heard from them on 02/24/2018.   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  Please schedule a follow-up appointment with Cassell Smiles, AGNP. Return if symptoms worsen or fail to improve.  If you have any other questions or concerns, please feel free to call the clinic or send a message through [place]. You may also schedule an earlier appointment if necessary.  You will receive a survey after today's visit either digitally by e-mail or paper by [employer]. Your experiences and feedback matter to [place].  Please respond so we know how we are doing as we provide care for you.   Cassell Smiles, DNP, AGNP-BC Adult Gerontology Nurse Practitioner [facility], [facility]   Anal Fistula  An anal fistula is a hole that develops between the bowel and the skin near the anus. The anus allows stool (feces) to leave the body. The anus has many tiny glands that make lubricating fluid. Sometimes, these glands become plugged and infected. This can cause a fluid-filled pocket (abscess) to form. An anal fistula often occurs when an abscess becomes infected and then develops into a hole between the bowel and the skin. What are the causes? In most cases, an anal fistula is caused by a past or current buildup of pus around the anus (anal abscess). Other causes include:  A complication of surgery.  Injury to the rectum or the area around  it.  Using high-energy beams (radiation) to treat the area around the rectum. What increases the risk? You are more likely to develop this condition if you have certain medical conditions or diseases, including:  Chronic inflammatory bowel disease, such as Crohn's disease or ulcerative colitis.  Colon cancer or rectal cancer.  Diverticular disease, such as diverticulitis.  A sexually transmitted infection, or STI, such as gonorrhea, chlamydia, or syphilis.  An infection that is caused by HIV. What are the signs or symptoms? Symptoms of this condition include:  Throbbing or constant pain that may be worse while you are sitting.  Swelling or irritation around the anus.  Pus or blood from an opening near the anus.  Pain when passing stool.  Fever or chills. How is this diagnosed? This condition is diagnosed based on:  A physical exam. This may include: ? An exam to find the external opening of the fistula. ? An exam with a probe or scope to help locate the internal opening of the fistula. ? An exam of the rectum with a gloved hand (digital rectal exam).  Imaging tests that use dye to find the exact location and path of the fistula. Tests may include: ? X-rays. ? Ultrasound. ? CT scan. ? MRI.  Other tests to find the cause of the anal fistula. How is this treated? This condition is most commonly treated with surgery. The type of surgery that is used will depend on where the fistula is  located and how complex the fistula is. Surgery may include:  A fistulotomy. The whole fistula is opened up, and the contents are drained to promote healing.  Seton placement. A silk string (seton) is placed into the fistula during a fistulotomy. This helps to drain any infection and promote healing.  Advancement flap procedure. Tissue is removed from your rectum or the skin around the anus and attached to the opening of the fistula.  Bioprosthetic plug. A cone-shaped plug is made from  your tissue and is used to block the opening of the fistula. Some anal fistulas do not require surgery. A nonsurgical treatment option involves injecting a fibrin glue to seal the fistula. You also may be prescribed an antibiotic medicine to treat any infection. Follow these instructions at home: Medicines  Take over-the-counter and prescription medicines only as told by your health care provider.  If you were prescribed an antibiotic medicine, take it as told by your health care provider. Do not stop taking the antibiotic even if you start to feel better.  Use a stool softener or a laxative if told to do so by your health care provider. General instructions   Eat a high-fiber diet as told by your health care provider. This can help to prevent constipation.  Drink enough fluid to keep your urine pale yellow.  Take a warm sitz bath for 15-20 minutes, 3-4 times per day, or as told by your health care provider. Sitz baths can ease your pain and discomfort and help with healing.  Follow good hygiene to keep the anal area as clean and dry as possible. Use wet toilet paper or a moist towelette after each bowel movement.  Keep all follow-up visits as told by your health care provider. This is important. Contact a health care provider if you have:  Increased pain that is not controlled with medicines.  New redness or swelling around the anal area.  New fluid, blood, or pus coming from the anal area.  Tenderness or warmth around the anal area. Get help right away if you have:  A fever.  Severe pain.  Chills or diarrhea.  Severe problems urinating or having a bowel movement. Summary  An anal fistula is a hole that develops between the bowel and the skin near the anus.  This condition is most often caused by a buildup of pus around the anus (anal abscess). Other causes include a complication of surgery, an injury to the rectum, or the use of radiation to treat the rectal  area.  This condition is most commonly treated with surgery.  Follow your health care provider's instructions about taking medicines, eating and drinking, or taking sitz baths.  Call your health care provider if you have more pain, swelling, or blood. Get help right away if you have fever, severe pain, or problems passing urine or stool. This information is not intended to replace advice given to you by your health care provider. Make sure you discuss any questions you have with your health care provider. Document Released: 01/30/2008 Document Revised: 07/02/2017 Document Reviewed: 07/02/2017 Elsevier Interactive Patient Education  2019 [employer].

## 2018-02-21 ENCOUNTER — Encounter: Payer: Self-pay | Admitting: Nurse Practitioner

## 2018-03-07 ENCOUNTER — Other Ambulatory Visit: Payer: Medicare Other

## 2018-03-08 ENCOUNTER — Ambulatory Visit: Payer: Medicare Other | Admitting: General Surgery

## 2018-03-10 ENCOUNTER — Other Ambulatory Visit: Payer: Self-pay | Admitting: Family Medicine

## 2018-03-10 DIAGNOSIS — F5101 Primary insomnia: Secondary | ICD-10-CM

## 2018-03-29 ENCOUNTER — Ambulatory Visit: Payer: Medicare Other | Admitting: Cardiovascular Disease

## 2018-03-29 ENCOUNTER — Encounter: Payer: Self-pay | Admitting: Cardiovascular Disease

## 2018-03-29 VITALS — BP 135/79 | HR 64 | Ht 62.0 in | Wt 137.5 lb

## 2018-03-29 DIAGNOSIS — R55 Syncope and collapse: Secondary | ICD-10-CM | POA: Diagnosis not present

## 2018-03-29 DIAGNOSIS — I739 Peripheral vascular disease, unspecified: Secondary | ICD-10-CM | POA: Diagnosis not present

## 2018-03-29 DIAGNOSIS — I10 Essential (primary) hypertension: Secondary | ICD-10-CM | POA: Diagnosis not present

## 2018-03-29 DIAGNOSIS — I779 Disorder of arteries and arterioles, unspecified: Secondary | ICD-10-CM

## 2018-03-29 MED ORDER — LOSARTAN POTASSIUM 100 MG PO TABS: 100.0000 mg | ORAL_TABLET | Freq: Every day | ORAL | Status: DC
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
## 2018-03-29 NOTE — Patient Instructions (Signed)
Medication Instructions:  STOP the Hydrochlorothiazide INCREASE the Losartan to 100 mg daily  If you need a refill on your cardiac medications before your next appointment, please call your pharmacy.   Lab work: None ordered  Testing/Procedures: Your physician has requested that you have a carotid duplex. This test is an ultrasound of the carotid arteries in your neck. It looks at blood flow through these arteries that supply the brain with blood. Allow one hour for this exam. There are no restrictions or special instructions. This will take place at [place] [id number], the [facility] office.   Follow-Up: As needed with Dr. Fletcher Anon

## 2018-03-31 DIAGNOSIS — N824 Other female intestinal-genital tract fistulae: Secondary | ICD-10-CM | POA: Insufficient documentation

## 2018-04-06 ENCOUNTER — Other Ambulatory Visit: Payer: Self-pay

## 2018-04-06 ENCOUNTER — Other Ambulatory Visit: Payer: Self-pay | Admitting: Cardiovascular Disease

## 2018-04-06 ENCOUNTER — Ambulatory Visit (INDEPENDENT_AMBULATORY_CARE_PROVIDER_SITE_OTHER): Payer: Medicare Other

## 2018-04-06 DIAGNOSIS — E782 Mixed hyperlipidemia: Secondary | ICD-10-CM

## 2018-04-06 DIAGNOSIS — I10 Essential (primary) hypertension: Secondary | ICD-10-CM

## 2018-04-06 DIAGNOSIS — Z0000 Encounter for general adult medical examination without abnormal findings: Secondary | ICD-10-CM

## 2018-04-06 DIAGNOSIS — I739 Peripheral vascular disease, unspecified: Secondary | ICD-10-CM

## 2018-04-06 DIAGNOSIS — J431 Panlobular emphysema: Secondary | ICD-10-CM

## 2018-04-06 DIAGNOSIS — M0579 Rheumatoid arthritis with rheumatoid factor of multiple sites without organ or systems involvement: Secondary | ICD-10-CM

## 2018-04-06 DIAGNOSIS — R7309 Other abnormal glucose: Secondary | ICD-10-CM

## 2018-04-06 MED ORDER — LOSARTAN POTASSIUM 100 MG PO TABS: 100.0000 mg | ORAL_TABLET | Freq: Every day | ORAL | 3 refills | Status: DC

## 2018-04-06 NOTE — Telephone Encounter (Signed)
°*  STAT* If patient is at the pharmacy, call can be transferred to refill team.   1. Which medications need to be refilled? (please list name of each medication and dose if known)  Losartan 100 mg po q d   2. Which pharmacy/location (including street and city if local pharmacy) is medication to be sent to? [facility]  3. Do they need a 30 day or 90 day supply? [place]

## 2018-04-07 ENCOUNTER — Other Ambulatory Visit: Payer: Medicare Other

## 2018-04-08 LAB — TSH: TSH: 1.66 mIU/L (ref 0.40–4.50)

## 2018-04-08 LAB — COMPLETE METABOLIC PANEL WITH GFR
AG Ratio: 1.3 (calc) (ref 1.0–2.5)
ALKALINE PHOSPHATASE (APISO): 71 U/L (ref 37–153)
ALT: 8 U/L (ref 6–29)
AST: 16 U/L (ref 10–35)
Albumin: 4 g/dL (ref 3.6–5.1)
BUN/Creatinine Ratio: 15 (calc) (ref 6–22)
BUN: 15 mg/dL (ref 7–25)
CO2: 26 mmol/L (ref 20–32)
CREATININE: 0.97 mg/dL — AB (ref 0.60–0.93)
Calcium: 9.5 mg/dL (ref 8.6–10.4)
Chloride: 103 mmol/L (ref 98–110)
GFR, Est African American: 66 mL/min/{1.73_m2} (ref >=60)
GFR, Est Non African American: 57 mL/min/{1.73_m2} — ABNORMAL LOW (ref >=60)
Globulin: 3.1 g/dL (calc) (ref 1.9–3.7)
Glucose, Bld: 100 mg/dL (ref 65–139)
Potassium: 4.5 mmol/L (ref 3.5–5.3)
SODIUM: 138 mmol/L (ref 135–146)
Total Bilirubin: 0.7 mg/dL (ref 0.2–1.2)
Total Protein: 7.1 g/dL (ref 6.1–8.1)

## 2018-04-08 LAB — CBC WITH DIFFERENTIAL/PLATELET
Absolute Monocytes: 504 cells/uL (ref 200–950)
Basophils Absolute: 28 cells/uL (ref 0–200)
Basophils Relative: 0.4 %
EOS PCT: 5.9 %
Eosinophils Absolute: 419 cells/uL (ref 15–500)
HEMATOCRIT: 37.6 % (ref 35.0–45.0)
Hemoglobin: 12.8 g/dL (ref 11.7–15.5)
Lymphs Abs: 3032 cells/uL (ref 850–3900)
MCH: 33.4 pg — ABNORMAL HIGH (ref 27.0–33.0)
MCHC: 34 g/dL (ref 32.0–36.0)
MCV: 98.2 fL (ref 80.0–100.0)
MPV: 10.7 fL (ref 7.5–12.5)
Monocytes Relative: 7.1 %
NEUTROS PCT: 43.9 %
Neutro Abs: 3117 cells/uL (ref 1500–7800)
PLATELETS: 382 10*3/uL (ref 140–400)
RBC: 3.83 10*6/uL (ref 3.80–5.10)
RDW: 12.3 % (ref 11.0–15.0)
TOTAL LYMPHOCYTE: 42.7 %
WBC: 7.1 10*3/uL (ref 3.8–10.8)

## 2018-04-08 LAB — LIPID PANEL
CHOL/HDL RATIO: 3.4 (calc) (ref <5.0)
Cholesterol: 203 mg/dL — ABNORMAL HIGH (ref <200)
HDL: 59 mg/dL (ref >=50)
LDL CHOLESTEROL (CALC): 123 mg/dL — AB
NON-HDL CHOLESTEROL (CALC): 144 mg/dL — AB (ref <130)
Triglycerides: 99 mg/dL (ref <150)

## 2018-04-08 LAB — HEMOGLOBIN A1C
EAG (MMOL/L): 6.2 (calc)
HEMOGLOBIN A1C: 5.5 %{Hb} (ref <5.7)
MEAN PLASMA GLUCOSE: 111 (calc)

## 2018-04-12 ENCOUNTER — Telehealth: Payer: Self-pay | Admitting: *Deleted

## 2018-04-12 NOTE — Telephone Encounter (Signed)
Left a message for the patient to call back.  

## 2018-04-12 NOTE — Telephone Encounter (Signed)
-----   Message from Wellington Hampshire, MD sent at 04/11/2018  4:00 PM EST ----- Inform patient that carotid Doppler showed mild nonobstructive disease bilaterally.  Good news overall.

## 2018-04-13 ENCOUNTER — Encounter: Payer: Self-pay | Admitting: Family Medicine

## 2018-04-13 ENCOUNTER — Other Ambulatory Visit: Payer: Self-pay

## 2018-04-13 ENCOUNTER — Ambulatory Visit (INDEPENDENT_AMBULATORY_CARE_PROVIDER_SITE_OTHER): Payer: Medicare Other | Admitting: Family Medicine

## 2018-04-13 VITALS — BP 112/60 | HR 54 | Temp 98.4°F | Resp 16 | Ht 62.0 in | Wt 138.0 lb

## 2018-04-13 DIAGNOSIS — Z0000 Encounter for general adult medical examination without abnormal findings: Secondary | ICD-10-CM | POA: Diagnosis not present

## 2018-04-13 DIAGNOSIS — I10 Essential (primary) hypertension: Secondary | ICD-10-CM

## 2018-04-13 DIAGNOSIS — I739 Peripheral vascular disease, unspecified: Secondary | ICD-10-CM

## 2018-04-13 DIAGNOSIS — M0579 Rheumatoid arthritis with rheumatoid factor of multiple sites without organ or systems involvement: Secondary | ICD-10-CM

## 2018-04-13 DIAGNOSIS — F5101 Primary insomnia: Secondary | ICD-10-CM | POA: Diagnosis not present

## 2018-04-13 DIAGNOSIS — C50211 Malignant neoplasm of upper-inner quadrant of right female breast: Secondary | ICD-10-CM

## 2018-04-13 DIAGNOSIS — J432 Centrilobular emphysema: Secondary | ICD-10-CM

## 2018-04-13 DIAGNOSIS — Z170 Estrogen receptor positive status [ER+]: Secondary | ICD-10-CM

## 2018-04-13 DIAGNOSIS — K219 Gastro-esophageal reflux disease without esophagitis: Secondary | ICD-10-CM

## 2018-04-13 MED ORDER — OMEPRAZOLE 40 MG PO CPDR: 40.0000 mg | DELAYED_RELEASE_CAPSULE | Freq: Every day | ORAL | 1 refills | Status: DC

## 2018-04-13 MED ORDER — ZOLPIDEM TARTRATE 5 MG PO TABS: 5.0000 mg | ORAL_TABLET | Freq: Every evening | ORAL | 2 refills | Status: DC | PRN

## 2018-04-13 NOTE — Assessment & Plan Note (Addendum)
Improved controll - Home BP readings slightly elevated Elevated Cr w/ CKD III    Plan:  Followed by Cardiology  1. Continue current BP regimen - Losartan 100mg  daily, HCTZ 25mg  daily 2. Encourage improved lifestyle - low sodium diet, regular exercise as planned water aerobics / walking 3. Continue monitor BP outside office, bring readings to next visit, if persistently >140/90 or new symptoms notify office sooner

## 2018-04-13 NOTE — Assessment & Plan Note (Signed)
Followed by Cardiology Carotid dopplers mild disease On Statin Lipids mostly controlled

## 2018-04-13 NOTE — Assessment & Plan Note (Signed)
Improved control on Ambien Multifactorial chronic problem w/ pain and some stress/anxiety Failed SSRI Lexapro, Trazodone  Plan Refill Ambien 5mg  nightly PRN  Recommend go to bed later, less sleep req and likely not as tired earlier Review sleep hygiene Follow-up

## 2018-04-13 NOTE — Assessment & Plan Note (Signed)
Uncontrolled GERD on low dose PPI  Increase Omeprazole from 20 up to 40mg  daily now

## 2018-04-13 NOTE — Assessment & Plan Note (Signed)
Stable without exacerbation - Continues Albuterol rarely PRN - Follow-up as needed consider maintenance therapy if needed

## 2018-04-13 NOTE — Assessment & Plan Note (Signed)
Stable without known recurrence S/p lumpectomy, radiation, femara Followed by General Surgery Has has negative Mammogram surveillance and self exams On Femara (aromatase inhib)

## 2018-04-13 NOTE — Assessment & Plan Note (Signed)
Stable currently, followed by [facility]

## 2018-04-13 NOTE — Patient Instructions (Addendum)
Thank you for coming to the office today.  Refilled Ambien (zolpidem) for sleep - you should have 1 month and 2 refills now  INCREASED dose Omeprazole from 20 up to 40mg  - for stomach acid  Follow-up with cardiology and upcoming surgery as planned.   Please schedule a Follow-up Appointment to: Return in about 6 months (around 10/12/2018) for HTN, GERD, specialist follow-up.  If you have any other questions or concerns, please feel free to call the office or send a message through [place]. You may also schedule an earlier appointment if necessary.  Additionally, you may be receiving a survey about your experience at our office within a few days to 1 week by e-mail or mail. We value your feedback.  [name], DO [place]

## 2018-04-13 NOTE — Progress Notes (Signed)
Subjective:    Patient ID: Elizabeth Bailey, female    DOB: Aug 14, 1940, 78 y.o.   MRN: [id number]  Elizabeth Bailey is a 78 y.o. female presenting on 04/13/2018 for Annual Exam   HPI   Here for Annual Physical and Lab Review.   FOLLOW-UP Insomnia Since prior visit doing well on Ambien 5mg  at night PRN, with good results, not taking every night, but taking it often. She has remained off Trazodone. Still trying melatonin but limited improvement on it. - Due for refill on Ambien, her bottle has no refills - Now trying to go to bed later around 9pm - Also improved flank and side pains from prior radiation and prior treatment, able to rest better - Still Admitsactive mind at night, may wake up in night and turn on TV otherwise using it less - tried to improve sleep hygiene -Prior history of PSG in past, reportedly these were all negative, never dx with sleep apnea, despite this on her problem list - Denies any depression, had history of depression on her problem list but denies this  CHRONIC HTN: Recent update. Cardiology has recently increase Losartan from 50 to 100 Current Meds -HCTZ 25mg  daily, Losartan 100mg  daily Reports good compliance, took meds today. Tolerating well, w/o complaints. Improved exercise / walking Admits some life stressor with a tenant at son's property  HYPERLIPIDEMIA / PAD Followed by [facility] Last week 04/06/18 - carotid Doppler showed mild nonobstructive disease bilaterally. - Last lipid panel 04/2018, mostly controlled slightly elevated LDL - Currently taking Simvastatin 20, tolerating well without side effects or myalgias  GERD Reports some flare up symptoms, less controlled on Omeprazole 20mg  daily. Interested in dose increase.  Rheumatoid Arthritis Followed by [facility]. On humira, and medication management. She has no new concerns or pain. Admits episodes joint pain flare.  Centrilobular Emphysema Without recent flare up. Stable.  Not on maintenance therapy.  Breast Cancer Followed by General Surgery. S/p lumpectomy, radiation, and medication management.  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  Health Maintenance: UTD Flu Vaccine already 12/08/17   Depression screen [facility] 2/9 04/13/2018 01/24/2018 01/05/2018  Decreased Interest 0 0 0  Down, Depressed, Hopeless 0 0 0  PHQ - 2 Score 0 0 0  Altered sleeping - - -  Tired, decreased energy - - -  Change in appetite - - -  Feeling bad or failure about yourself  - - -  Trouble concentrating - - -  Moving slowly or fidgety/restless - - -  Suicidal thoughts - - -  PHQ-9 Score - - -  Difficult doing work/chores - - -   No flowsheet data found.   Past Medical History:  Diagnosis Date  . Arthritis   . Asthma   . Breast cancer ([place]) 2018  . Cancer ([place]) 03/27/2016   Invasive mammary carcinoma right breast Stage 1 ER/PR positive  . Chronic bronchitis ([place])   . Collagen vascular disease ([place])   . Depression   . Diverticulosis   . GERD (gastroesophageal reflux disease)   . High cholesterol   . Hypertension   . Personal history of radiation therapy   . Seasonal allergies    Past Surgical History:  Procedure Laterality Date  . ABDOMINAL HYSTERECTOMY    . BREAST BIOPSY  1970's  . BREAST BIOPSY Right 03/27/2016   INVASIVE MAMMARY CARCINOMA  . BREAST LUMPECTOMY Right 04/09/2016   radiation  . BREAST LUMPECTOMY WITH SENTINEL  LYMPH NODE BIOPSY Right 04/09/2016   Procedure: BREAST LUMPECTOMY WITH SENTINEL LYMPH NODE BX;  Surgeon: Christene Lye, MD;  Location: [facility] ORS;  Service: General;  Laterality: Right;  . CAROTID ENDARTERECTOMY Left   . CHOLECYSTECTOMY    . COLONOSCOPY  2005  . DG  BONE DENSITY ([place] [facility])  2010  . DIAGNOSTIC MAMMOGRAM  2015  . ESOPHAGOGASTRODUODENOSCOPY (EGD) WITH PROPOFOL N/A 05/05/2016   Procedure: ESOPHAGOGASTRODUODENOSCOPY (EGD) WITH PROPOFOL;  Surgeon: Lucilla Lame, MD;   Location: [facility] ENDOSCOPY;  Service: Endoscopy;  Laterality: N/A;  . EUS N/A 03/12/2016   Procedure: FULL UPPER ENDOSCOPIC ULTRASOUND (EUS) RADIAL;  Surgeon: Holly Bodily, MD;  Location: [facility] ENDOSCOPY;  Service: Endoscopy;  Laterality: N/A;  . GALLBLADDER SURGERY    . PANCREAS SURGERY  01/24/2016   Dr Burt Knack  . pap smear  2014  . REPAIR OF PERFORATED ULCER N/A 01/24/2016   Procedure: Exploratory Laparotomy with biopsy of Pancreas;  Surgeon: Florene Glen, MD;  Location: [facility] ORS;  Service: General;  Laterality: N/A;  . UPPER GI ENDOSCOPY  03/12/2016   Dr Mont Dutton   Social History   Socioeconomic History  . Marital status: Widowed    Spouse name: Not on file  . Number of children: Not on file  . Years of education: Not on file  . Highest education level: Not on file  Occupational History  . Not on file  Social Needs  . Financial resource strain: Not hard at all  . Food insecurity:    Worry: Never true    Inability: Never true  . Transportation needs:    Medical: No    Non-medical: No  Tobacco Use  . Smoking status: Former Smoker    Packs/day: 0.50    Years: 10.00    Pack years: 5.00    Types: Cigarettes    Last attempt to quit: 1988    Years since quitting: 32.1  . Smokeless tobacco: Former Systems developer  . Tobacco comment: quit 1988  Substance and Sexual Activity  . Alcohol use: No    Alcohol/week: 0.0 standard drinks    Comment: wine twice a month   . Drug use: No  . Sexual activity: Never  Lifestyle  . Physical activity:    Days per week: 7 days    Minutes per session: 30 min  . Stress: Not at all  Relationships  . Social connections:    Talks on phone: More than three times a week    Gets together: More than three times a week    Attends religious service: More than 4 times per year    Active member of club or organization: No    Attends meetings of clubs or organizations: Never    Relationship status: Widowed  . Intimate partner violence:    Fear of  current or ex partner: No    Emotionally abused: No    Physically abused: No    Forced sexual activity: No  Other Topics Concern  . Not on file  Social History Narrative  . Not on file   Family History  Problem Relation Age of Onset  . Pneumonia Mother   . Depression Mother   . Depression Sister   . Breast cancer Sister 29  . Diabetes Sister   . Stroke Brother   . Heart disease Brother    Current Outpatient Medications on File Prior to Visit  Medication Sig  . acetaminophen (TYLENOL) 325 MG tablet Take 650 mg by mouth every 6 (  six) hours as needed for mild pain.  . Adalimumab (HUMIRA PEN) 40 MG/0.8ML PNKT Inject 40 mg into the skin every 21 ( twenty-one) days. TAKES EVERY OTHER WEEK  LAST DOSE WAS IN 01-2016  . albuterol (PROVENTIL HFA;VENTOLIN HFA) 108 (90 Base) MCG/ACT inhaler Inhale 2 puffs into the lungs every 6 (six) hours as needed for wheezing or shortness of breath.  . ALBUTEROL IN Inhale 2 Inhalers into the lungs every 6 (six) hours as needed.  Marland Kitchen amLODipine (NORVASC) 5 MG tablet Take 5 mg by mouth daily.  . bacitracin ointment Apply to affected (left wrist) area daily  . budesonide-formoterol (SYMBICORT) 160-4.5 MCG/ACT inhaler Inhale 1 puff into the lungs 2 (two) times daily.  . calcium-vitamin D (OSCAL WITH D) 500-200 MG-UNIT TABS tablet Take by mouth.  . diclofenac sodium (VOLTAREN) 1 % GEL Apply 3 grams topically qid prn  . diphenhydramine-acetaminophen (TYLENOL PM) 25-500 MG TABS tablet Take 1-2 tablets by mouth at bedtime as needed (SLEEP). 1-2 TABLETS DEPENDING ON DIFFICULTLY SLEEPING  . feeding supplement (BOOST / RESOURCE BREEZE) LIQD Take 1 Container by mouth 3 (three) times daily between meals.  . fluconazole (DIFLUCAN) 150 MG tablet Take one tablet by mouth on Day 1, then repeat 2nd dose after finish antibiotics  . fluticasone (FLONASE) 50 MCG/ACT nasal spray Place 2 sprays into both nostrils daily. Use for 4-6 weeks then stop and use seasonally or as needed.    . folic acid (FOLVITE) 1 MG tablet Take by mouth.  . gabapentin (NEURONTIN) 300 MG capsule TAKE 1 CAPSULE EVERY MORNING, 1 AT [facility] AT DINNER AND 2 CAPSULES ATBEDTIME  . HYDROcodone-acetaminophen (NORCO/VICODIN) 5-325 MG tablet Take 1 tablet by mouth every 6 (six) hours as needed for moderate pain.  Marland Kitchen letrozole (FEMARA) 2.5 MG tablet Take 1 tablet (2.5 mg total) by mouth daily.  Marland Kitchen losartan (COZAAR) 100 MG tablet Take 1 tablet (100 mg total) by mouth daily.  . Melatonin 10 MG TABS Take 2 tablets by mouth at bedtime. Reported on 06/03/2015  . montelukast (SINGULAIR) 10 MG tablet TAKE 1 TABLET BY MOUTH ONCE DAILY  . Multiple Vitamin (MULTI-VITAMINS) TABS Take by mouth.  . Omega 3 1000 MG CAPS Take 1,000 mg by mouth daily. omega-3 fatty acids-vitamin E (FISH OIL) 1,000 mg  . polyethylene glycol powder (GLYCOLAX/MIRALAX) powder Take 17-34 g by mouth daily as needed for moderate constipation.  . simvastatin (ZOCOR) 20 MG tablet Take 1 tablet every other night.  Marland Kitchen ZINC OXIDE, TOPICAL, 10 % CREA Apply 1 application topically every 3 (three) hours as needed (perineal irritation).   No current facility-administered medications on file prior to visit.     Review of Systems  Constitutional: Negative for activity change, appetite change, chills, diaphoresis, fatigue and fever.  HENT: Negative for congestion and hearing loss.   Eyes: Negative for visual disturbance.  Respiratory: Negative for apnea, cough, chest tightness, shortness of breath and wheezing.   Cardiovascular: Negative for chest pain, palpitations and leg swelling.  Gastrointestinal: Negative for abdominal pain, anal bleeding, blood in stool, constipation, diarrhea, nausea and vomiting.  Endocrine: Negative for cold intolerance.  Genitourinary: Negative for difficulty urinating, dysuria, frequency, hematuria and urgency.  Musculoskeletal: Negative for arthralgias, back pain and neck pain.  Skin: Negative for rash.  Allergic/Immunologic:  Negative for environmental allergies.  Neurological: Negative for dizziness, weakness, light-headedness, numbness and headaches.  Hematological: Negative for adenopathy.  Psychiatric/Behavioral: Negative for behavioral problems, dysphoric mood and sleep disturbance. The patient is not nervous/anxious.  Per HPI unless specifically indicated above     Objective:    BP 112/60   Pulse (!) 54   Temp 98.4 F (36.9 C) (Oral)   Resp 16   Ht 5\' 2"  (1.575 m)   Wt 138 lb (62.6 kg)   BMI 25.24 kg/m   Wt Readings from Last 3 Encounters:  04/13/18 138 lb (62.6 kg)  03/29/18 137 lb 8 oz (62.4 kg)  02/18/18 135 lb 9.6 oz (61.5 kg)    Physical Exam Vitals signs and nursing note reviewed.  Constitutional:      General: She is not in acute distress.    Appearance: She is well-developed. She is not diaphoretic.     Comments: Well-appearing, comfortable, cooperative  HENT:     Head: Normocephalic and atraumatic.  Eyes:     General:        Right eye: No discharge.        Left eye: No discharge.     Conjunctiva/sclera: Conjunctivae normal.     Pupils: Pupils are equal, round, and reactive to light.  Neck:     Musculoskeletal: Normal range of motion and neck supple.     Thyroid: No thyromegaly.  Cardiovascular:     Rate and Rhythm: Normal rate and regular rhythm.     Heart sounds: Normal heart sounds. No murmur.  Pulmonary:     Effort: Pulmonary effort is normal. No respiratory distress.     Breath sounds: Normal breath sounds. No wheezing or rales.  Abdominal:     General: Bowel sounds are normal. There is no distension.     Palpations: Abdomen is soft. There is no mass.     Tenderness: There is no abdominal tenderness.  Musculoskeletal: Normal range of motion.        General: No tenderness.     Comments: Upper / Lower Extremities: - Normal muscle tone, strength bilateral upper extremities 5/5, lower extremities 5/5  Lymphadenopathy:     Cervical: No cervical adenopathy.  Skin:     General: Skin is warm and dry.     Findings: No erythema or rash.  Neurological:     Mental Status: She is alert and oriented to person, place, and time.     Comments: Distal sensation intact to light touch all extremities  Psychiatric:        Behavior: Behavior normal.     Comments: Well groomed, good eye contact, normal speech and thoughts    Results for orders placed or performed in visit on 04/06/18  TSH  Result Value Ref Range   TSH 1.66 0.40 - 4.50 mIU/L  Lipid panel  Result Value Ref Range   Cholesterol 203 (H) <200 mg/dL   HDL 59 > OR = 50 mg/dL   Triglycerides 99 <150 mg/dL   LDL Cholesterol (Calc) 123 (H) mg/dL (calc)   Total CHOL/HDL Ratio 3.4 <5.0 (calc)   Non-HDL Cholesterol (Calc) 144 (H) <130 mg/dL (calc)  COMPLETE METABOLIC PANEL WITH GFR  Result Value Ref Range   Glucose, Bld 100 65 - 139 mg/dL   BUN 15 7 - 25 mg/dL   Creat 0.97 (H) 0.60 - 0.93 mg/dL   GFR, Est Non African American 57 (L) > OR = 60 mL/min/1.59m2   GFR, Est African American 66 > OR = 60 mL/min/1.12m2   BUN/Creatinine Ratio 15 6 - 22 (calc)   Sodium 138 135 - 146 mmol/L   Potassium 4.5 3.5 - 5.3 mmol/L   Chloride 103 98 - 110  mmol/L   CO2 26 20 - 32 mmol/L   Calcium 9.5 8.6 - 10.4 mg/dL   Total Protein 7.1 6.1 - 8.1 g/dL   Albumin 4.0 3.6 - 5.1 g/dL   Globulin 3.1 1.9 - 3.7 g/dL (calc)   AG Ratio 1.3 1.0 - 2.5 (calc)   Total Bilirubin 0.7 0.2 - 1.2 mg/dL   Alkaline phosphatase (APISO) 71 37 - 153 U/L   AST 16 10 - 35 U/L   ALT 8 6 - 29 U/L  CBC with Differential/Platelet  Result Value Ref Range   WBC 7.1 3.8 - 10.8 Thousand/uL   RBC 3.83 3.80 - 5.10 Million/uL   Hemoglobin 12.8 11.7 - 15.5 g/dL   HCT 37.6 35.0 - 45.0 %   MCV 98.2 80.0 - 100.0 fL   MCH 33.4 (H) 27.0 - 33.0 pg   MCHC 34.0 32.0 - 36.0 g/dL   RDW 12.3 11.0 - 15.0 %   Platelets 382 140 - 400 Thousand/uL   MPV 10.7 7.5 - 12.5 fL   Neutro Abs 3,117 1,500 - 7,800 cells/uL   Lymphs Abs 3,032 850 - 3,900 cells/uL    Absolute Monocytes 504 200 - 950 cells/uL   Eosinophils Absolute 419 15 - 500 cells/uL   Basophils Absolute 28 0 - 200 cells/uL   Neutrophils Relative % 43.9 %   Total Lymphocyte 42.7 %   Monocytes Relative 7.1 %   Eosinophils Relative 5.9 %   Basophils Relative 0.4 %  Hemoglobin A1c  Result Value Ref Range   Hgb A1c MFr Bld 5.5 <5.7 % of total Hgb   Mean Plasma Glucose 111 (calc)   eAG (mmol/L) 6.2 (calc)      Assessment & Plan:   Problem List Items Addressed This Visit    Centrilobular emphysema (HCC)    Stable without exacerbation - Continues Albuterol rarely PRN - Follow-up as needed consider maintenance therapy if needed      GERD (gastroesophageal reflux disease)    Uncontrolled GERD on low dose PPI  Increase Omeprazole from 20 up to 40mg  daily now      Relevant Medications   omeprazole (PRILOSEC) 40 MG capsule   Hypertension    Improved controll - Home BP readings slightly elevated Elevated Cr w/ CKD III    Plan:  Followed by Cardiology  1. Continue current BP regimen - Losartan 100mg  daily, HCTZ 25mg  daily 2. Encourage improved lifestyle - low sodium diet, regular exercise as planned water aerobics / walking 3. Continue monitor BP outside office, bring readings to next visit, if persistently >140/90 or new symptoms notify office sooner      Insomnia    Improved control on Ambien Multifactorial chronic problem w/ pain and some stress/anxiety Failed SSRI Lexapro, Trazodone  Plan Refill Ambien 5mg  nightly PRN  Recommend go to bed later, less sleep req and likely not as tired earlier Review sleep hygiene Follow-up      Relevant Medications   zolpidem (AMBIEN) 5 MG tablet   Malignant neoplasm of upper-inner quadrant of right breast in female, estrogen receptor positive ([place])    Stable without known recurrence S/p lumpectomy, radiation, femara Followed by General Surgery Has has negative Mammogram surveillance and self exams On Femara (aromatase  inhib)      PAD (peripheral artery disease) ([place])    Followed by Cardiology Carotid dopplers mild disease On Statin Lipids mostly controlled      Rheumatoid arthritis involving multiple sites with positive rheumatoid factor ([place])    Stable  currently, followed by [facility]       Other Visit Diagnoses    Annual physical exam    -  Primary      Updated Health Maintenance information Reviewed recent lab results with patient Encouraged improvement to lifestyle with diet and exercise   Meds ordered this encounter  Medications  . zolpidem (AMBIEN) 5 MG tablet    Sig: Take 1 tablet (5 mg total) by mouth at bedtime as needed for sleep.    Dispense:  30 tablet    Refill:  2  . omeprazole (PRILOSEC) 40 MG capsule    Sig: Take 1 capsule (40 mg total) by mouth daily before breakfast.    Dispense:  90 capsule    Refill:  1     Follow up plan: Return in about 6 months (around 10/12/2018) for HTN, GERD, specialist follow-up.  [name], [facility] 04/13/2018, 9:12 AM

## 2018-04-15 NOTE — Telephone Encounter (Signed)
Patient made aware of results and verbalized understanding.  

## 2018-04-28 ENCOUNTER — Ambulatory Visit: Payer: Medicare Other | Admitting: General Surgery

## 2018-04-28 MED ORDER — HEPARIN SODIUM (PORCINE) 5000 UNIT/ML IJ SOLN
5000.00 | INTRAMUSCULAR | Status: DC
Start: 2018-04-28 — End: 2018-04-28

## 2018-04-28 MED ORDER — ZOLPIDEM TARTRATE 5 MG PO TABS
5.00 | ORAL_TABLET | ORAL | Status: DC
Start: ? — End: 2018-04-28

## 2018-04-28 MED ORDER — POLYETHYLENE GLYCOL 3350 17 G PO PACK
17.00 | PACK | ORAL | Status: DC
Start: 2018-04-29 — End: 2018-04-28

## 2018-04-28 MED ORDER — GENERIC EXTERNAL MEDICATION
2.50 | Status: DC
Start: ? — End: 2018-04-28

## 2018-04-28 MED ORDER — ACETAMINOPHEN 650 MG/20.3ML PO SOLN
975.00 | ORAL | Status: DC
Start: 2018-04-28 — End: 2018-04-28

## 2018-04-28 MED ORDER — AMLODIPINE BESYLATE 5 MG PO TABS
5.00 | ORAL_TABLET | ORAL | Status: DC
Start: 2018-04-29 — End: 2018-04-28

## 2018-04-28 MED ORDER — PRAVASTATIN SODIUM 40 MG PO TABS
40.00 | ORAL_TABLET | ORAL | Status: DC
Start: 2018-04-28 — End: 2018-04-28

## 2018-04-28 MED ORDER — PANTOPRAZOLE SODIUM 40 MG PO TBEC
40.00 | DELAYED_RELEASE_TABLET | ORAL | Status: DC
Start: 2018-04-29 — End: 2018-04-28

## 2018-04-28 MED ORDER — GENERIC EXTERNAL MEDICATION
2.00 | Status: DC
Start: ? — End: 2018-04-28

## 2018-04-28 MED ORDER — DICLOFENAC SODIUM 1 % TD GEL
2.00 | TRANSDERMAL | Status: DC
Start: ? — End: 2018-04-28

## 2018-04-28 MED ORDER — PREGABALIN 25 MG PO CAPS
25.00 | ORAL_CAPSULE | ORAL | Status: DC
Start: 2018-04-28 — End: 2018-04-28

## 2018-04-28 MED ORDER — GENERIC EXTERNAL MEDICATION
6.25 | Status: DC
Start: ? — End: 2018-04-28

## 2018-04-28 MED ORDER — BUTALBITAL-APAP-CAFFEINE 50-325-40 MG PO TABS
1.00 | ORAL_TABLET | ORAL | Status: DC
Start: ? — End: 2018-04-28

## 2018-04-28 MED ORDER — MONTELUKAST SODIUM 10 MG PO TABS
10.00 | ORAL_TABLET | ORAL | Status: DC
Start: 2018-04-29 — End: 2018-04-28

## 2018-04-28 MED ORDER — BUDESONIDE-FORMOTEROL FUMARATE 160-4.5 MCG/ACT IN AERO
2.00 | INHALATION_SPRAY | RESPIRATORY_TRACT | Status: DC
Start: 2018-04-28 — End: 2018-04-28

## 2018-04-28 MED ORDER — GENERIC EXTERNAL MEDICATION
Status: DC
Start: 2018-04-29 — End: 2018-04-28

## 2018-04-28 MED ORDER — FLUTICASONE PROPIONATE 50 MCG/ACT NA SUSP
2.00 | NASAL | Status: DC
Start: 2018-04-29 — End: 2018-04-28

## 2018-04-28 MED ORDER — MELATONIN 3 MG PO TABS
3.00 | ORAL_TABLET | ORAL | Status: DC
Start: ? — End: 2018-04-28

## 2018-04-28 MED ORDER — ONDANSETRON 4 MG PO TBDP
4.00 | ORAL_TABLET | ORAL | Status: DC
Start: ? — End: 2018-04-28

## 2018-05-06 ENCOUNTER — Other Ambulatory Visit: Payer: Self-pay

## 2018-05-06 ENCOUNTER — Ambulatory Visit: Payer: Medicare Other | Admitting: Family Medicine

## 2018-05-06 ENCOUNTER — Encounter: Payer: Self-pay | Admitting: Family Medicine

## 2018-05-06 VITALS — BP 120/52 | HR 76 | Temp 98.4°F | Resp 16 | Ht 62.0 in | Wt 130.0 lb

## 2018-05-06 DIAGNOSIS — J432 Centrilobular emphysema: Secondary | ICD-10-CM

## 2018-05-06 DIAGNOSIS — J90 Pleural effusion, not elsewhere classified: Secondary | ICD-10-CM | POA: Diagnosis not present

## 2018-05-06 DIAGNOSIS — N824 Other female intestinal-genital tract fistulae: Secondary | ICD-10-CM

## 2018-05-06 DIAGNOSIS — Z9049 Acquired absence of other specified parts of digestive tract: Secondary | ICD-10-CM | POA: Diagnosis not present

## 2018-05-06 NOTE — Progress Notes (Signed)
Subjective:    Patient ID: Elizabeth Bailey, female    DOB: 1940-04-27, 78 y.o.   MRN: [id number]  Elizabeth Bailey is a 78 y.o. 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 For elective surgery due to sigmoid diverticulitis with a colovaginal fistula. She is s/p robotic-assisted laparoscopic sigmoidectomy with coloproctostomy and primary repair of colovaginal fistula on 04/19/2018. She was inpatient for post-operative recovery with resolving ileus and continued oxygen requirements, treated with Lasix course for edema, for pleural effusion, and on temporary oxygen on discharge with Home Health PT for 1 month.  Additionally on imaging CT chest for lungs identified patchy ground glass opacity and incidental 0.8 cm nodule in RUL and 0.7 cm nodule in RLL - she was scheduled for repeat CXR outpatient and scheduled w/ Pulmonology  - Today reports overall has done well after discharge. Symptoms of dyspnea and oxygen need have improved. Her abdominal symptoms improved. Still has some post op discomfort but improving.  - HH PT is coming to her house - She is walking more, gradually improving - Her breathing is improved. She does get winded with too much walking. She initially met criteria for supplemental oxygen for exertion, and she no longer needs it now. Her Pulse ox now has been >90% consistently on home health checks - She has concern with some nausea, she called [facility] but  now it has improved, she took Pepto bismol PRN with some good relief, she declines an offered rx of Zofran - Regarding, pain she still has rx from prior Dr Sharlet Salina in past for orthopedic issue  She has follow-up with her attending at [facility] - Dr Kathline Magic - has apt with her next week 3/12  Denies any fevers, chills, sweats, worsening abdominal pain, discharge, diarrhea, worsening dyspnea  I have reviewed the discharge medication list, and have reconciled the current and discharge medications today.   Current Outpatient Medications:  .  acetaminophen (TYLENOL) 325 MG tablet, Take 650 mg by mouth every 6 (six) hours as needed for mild pain., Disp: , Rfl:  .  Adalimumab (HUMIRA PEN) 40 MG/0.8ML PNKT, Inject 40 mg into the skin every 21 ( twenty-one) days. TAKES EVERY OTHER WEEK  LAST DOSE WAS IN 01-2016, Disp: , Rfl:  .  albuterol (PROVENTIL HFA;VENTOLIN HFA) 108 (90 Base) MCG/ACT inhaler, Inhale 2 puffs into the lungs every 6 (six) hours as needed for wheezing or shortness of breath., Disp: , Rfl:  .  ALBUTEROL IN, Inhale 2 Inhalers into the lungs every 6 (six) hours as needed., Disp: , Rfl:  .  amLODipine (NORVASC) 5 MG tablet, Take 5 mg by mouth daily., Disp: , Rfl:  .  bacitracin ointment, Apply to affected (left wrist) area daily, Disp: 30 g, Rfl: 0 .  budesonide-formoterol (SYMBICORT) 160-4.5 MCG/ACT inhaler, Inhale 1 puff into the lungs 2 (two) times daily., Disp: 1 Inhaler, Rfl: 12 .  calcium-vitamin D (OSCAL WITH D) 500-200 MG-UNIT TABS tablet, Take by mouth., Disp: , Rfl:  .  diclofenac sodium (VOLTAREN) 1 %  GEL, Apply 3 grams topically qid prn, Disp: , Rfl:  .  diphenhydramine-acetaminophen (TYLENOL PM) 25-500 MG TABS tablet, Take 1-2 tablets by mouth at bedtime as needed (SLEEP). 1-2 TABLETS DEPENDING ON DIFFICULTLY SLEEPING, Disp: , Rfl:  .  feeding supplement (BOOST / RESOURCE BREEZE) LIQD, Take 1 Container by mouth 3 (three) times daily between meals., Disp: , Rfl: 0 .   fluconazole (DIFLUCAN) 150 MG tablet, Take one tablet by mouth on Day 1, then repeat 2nd dose after finish antibiotics, Disp: 2 tablet, Rfl: 1 .  fluticasone (FLONASE) 50 MCG/ACT nasal spray, Place 2 sprays into both nostrils daily. Use for 4-6 weeks then stop and use seasonally or as needed., Disp: 16 g, Rfl: 3 .  folic acid (FOLVITE) 1 MG tablet, Take by mouth., Disp: , Rfl:  .  gabapentin (NEURONTIN) 300 MG capsule, TAKE 1 CAPSULE EVERY MORNING, 1 AT [facility] AT DINNER AND 2 CAPSULES ATBEDTIME, Disp: 150 capsule, Rfl: 5 .  HYDROcodone-acetaminophen (NORCO/VICODIN) 5-325 MG tablet, Take 1 tablet by mouth every 6 (six) hours as needed for moderate pain., Disp: 20 tablet, Rfl: 0 .  letrozole (FEMARA) 2.5 MG tablet, Take 1 tablet (2.5 mg total) by mouth daily., Disp: 90 tablet, Rfl: 3 .  losartan (COZAAR) 100 MG tablet, Take 1 tablet (100 mg total) by mouth daily., Disp: 90 tablet, Rfl: 3 .  Melatonin 10 MG TABS, Take 2 tablets by mouth at bedtime. Reported on 06/03/2015, Disp: , Rfl:  .  montelukast (SINGULAIR) 10 MG tablet, TAKE 1 TABLET BY MOUTH ONCE DAILY, Disp: 30 tablet, Rfl: 5 .  Multiple Vitamin (MULTI-VITAMINS) TABS, Take by mouth., Disp: , Rfl:  .  Omega 3 1000 MG CAPS, Take 1,000 mg by mouth daily. omega-3 fatty acids-vitamin E (FISH OIL) 1,000 mg, Disp: , Rfl:  .  omeprazole (PRILOSEC) 40 MG capsule, Take 1 capsule (40 mg total) by mouth daily before breakfast., Disp: 90 capsule, Rfl: 1 .  polyethylene glycol powder (GLYCOLAX/MIRALAX) powder, Take 17-34 g by mouth daily as needed for moderate constipation., Disp: 250 g, Rfl: 1 .  simvastatin (ZOCOR) 20 MG tablet, Take 1 tablet every other night., Disp: 30 tablet, Rfl: 12 .  ZINC OXIDE, TOPICAL, 10 % CREA, Apply 1 application topically every 3 (three) hours as needed (perineal irritation)., Disp: 113 g, Rfl: 2 .  zolpidem (AMBIEN) 5 MG tablet, Take 1 tablet (5 mg total) by mouth at bedtime as needed for sleep., Disp: 30 tablet, Rfl:  2  ------------------------------------------------------------------------- Social History   Tobacco Use  . Smoking status: Former Smoker    Packs/day: 0.50    Years: 10.00    Pack years: 5.00    Types: Cigarettes    Last attempt to quit: 1988    Years since quitting: 32.2  . Smokeless tobacco: Former Systems developer  . Tobacco comment: quit 1988  Substance Use Topics  . Alcohol use: No    Alcohol/week: 0.0 standard drinks    Comment: wine twice a month   . Drug use: No    Review of Systems Per HPI unless specifically indicated above     Objective:    BP (!) 120/52   Pulse 76   Temp 98.4 F (36.9 C) (Oral)   Resp 16   Ht _0  (1.575 m)   Wt 130 lb (59 kg)   SpO2 96%   BMI 23.78 kg/m   Wt Readings from Last 3 Encounters:  05/06/18 130 lb (59 kg)  04/13/18 138 lb (62.6 kg)  03/29/18 137 lb 8 oz (62.4 kg)    Physical Exam Vitals signs and nursing note reviewed.  Constitutional:      General: She is not in acute distress.    Appearance: She is well-developed. She is not diaphoretic.     Comments: Chronically ill but currently well appearing elderly 78 yr old female, comfortable, cooperative  HENT:     Head: Normocephalic and atraumatic.  Eyes:     General:        Right eye: No discharge.        Left eye: No discharge.     Conjunctiva/sclera: Conjunctivae normal.  Neck:     Musculoskeletal: Normal range of motion and neck supple.     Thyroid: No thyromegaly.  Cardiovascular:     Rate and Rhythm: Normal rate and regular rhythm.     Heart sounds: Normal heart sounds. No murmur.  Pulmonary:     Effort: Pulmonary effort is normal. No respiratory distress.     Breath sounds: Normal breath sounds. No wheezing or rales.     Comments: Mild increased work of breathing but overall seems to be comfortable. Good air movement. No focal abnormal breath sounds. Abdominal:     General: Bowel sounds are normal. There is no distension.     Palpations: Abdomen is soft.      Comments: Lower abdomen with midline vertical incision well healed, no sign of secondary problem infection or separation  Musculoskeletal: Normal range of motion.     Right lower leg: No edema.     Left lower leg: No edema.  Lymphadenopathy:     Cervical: No cervical adenopathy.  Skin:    General: Skin is warm and dry.     Findings: No erythema or rash.  Neurological:     Mental Status: She is alert and oriented to person, place, and time.  Psychiatric:        Behavior: Behavior normal.     Comments: Well groomed, good eye contact, normal speech and thoughts        Results for orders placed or performed in visit on 04/06/18  TSH  Result Value Ref Range   TSH 1.66 0.40 - 4.50 mIU/L  Lipid panel  Result Value Ref Range   Cholesterol 203 (H) <200 mg/dL   HDL 59 > OR = 50 mg/dL   Triglycerides 99 <150 mg/dL   LDL Cholesterol (Calc) 123 (H) mg/dL (calc)   Total CHOL/HDL Ratio 3.4 <5.0 (calc)   Non-HDL Cholesterol (Calc) 144 (H) <130 mg/dL (calc)  COMPLETE METABOLIC PANEL WITH GFR  Result Value Ref Range   Glucose, Bld 100 65 - 139 mg/dL   BUN 15 7 - 25 mg/dL   Creat 0.97 (H) 0.60 - 0.93 mg/dL   GFR, Est Non African American 57 (L) > OR = 60 mL/min/1.41m   GFR, Est African American 66 > OR = 60 mL/min/1.722m  BUN/Creatinine Ratio 15 6 - 22 (calc)   Sodium 138 135 - 146 mmol/L   Potassium 4.5 3.5 - 5.3 mmol/L   Chloride 103 98 - 110 mmol/L   CO2 26 20 - 32 mmol/L   Calcium 9.5 8.6 - 10.4 mg/dL   Total Protein 7.1 6.1 - 8.1 g/dL   Albumin 4.0 3.6 - 5.1 g/dL   Globulin 3.1 1.9 - 3.7 g/dL (calc)   AG Ratio 1.3 1.0 - 2.5 (calc)   Total Bilirubin 0.7 0.2 - 1.2 mg/dL   Alkaline phosphatase (APISO) 71 37 - 153  U/L   AST 16 10 - 35 U/L   ALT 8 6 - 29 U/L  CBC with Differential/Platelet  Result Value Ref Range   WBC 7.1 3.8 - 10.8 Thousand/uL   RBC 3.83 3.80 - 5.10 Million/uL   Hemoglobin 12.8 11.7 - 15.5 g/dL   HCT 37.6 35.0 - 45.0 %   MCV 98.2 80.0 - 100.0 fL   MCH 33.4  (H) 27.0 - 33.0 pg   MCHC 34.0 32.0 - 36.0 g/dL   RDW 12.3 11.0 - 15.0 %   Platelets 382 140 - 400 Thousand/uL   MPV 10.7 7.5 - 12.5 fL   Neutro Abs 3,117 1,500 - 7,800 cells/uL   Lymphs Abs 3,032 850 - 3,900 cells/uL   Absolute Monocytes 504 200 - 950 cells/uL   Eosinophils Absolute 419 15 - 500 cells/uL   Basophils Absolute 28 0 - 200 cells/uL   Neutrophils Relative % 43.9 %   Total Lymphocyte 42.7 %   Monocytes Relative 7.1 %   Eosinophils Relative 5.9 %   Basophils Relative 0.4 %  Hemoglobin A1c  Result Value Ref Range   Hgb A1c MFr Bld 5.5 <5.7 % of total Hgb   Mean Plasma Glucose 111 (calc)   eAG (mmol/L) 6.2 (calc)      Assessment & Plan:   Problem List Items Addressed This Visit    Centrilobular emphysema (HCC)   Colovaginal fistula - Primary    Other Visit Diagnoses    S/P laparoscopic-assisted sigmoidectomy       Pleural effusion          Clinically significantly improved, but still recovering gradually post op. No evidence of surgical complication, no problem with incision Resolved or resolving pleural effusion No longer require oxygen at rest, may need temporary with exertion but seems to decline this and has [place] working with her on PT to recover post op  Declined nausea med zofran PRN, notify office if want to try  Follow-up as planned w/ [facility]  No orders of the defined types were placed in this encounter.   Follow up plan: Return in about 2 weeks (around 05/20/2018), or if symptoms worsen or fail to improve, for short of breath, oxygen.  [name], [facility] 05/06/2018, 11:26 AM

## 2018-05-06 NOTE — Patient Instructions (Addendum)
Thank you for coming to the office today.  Keep up the good work with your recovery.  Breathing sounds improved today - you no longer need the oxygen it seems. As long as maintained oxygen status of >88 to 90%.  I do not hear any fluid in the lungs today - no further fluid medicine is needed at this time.  Follow-up with surgeon at [facility] next week.  Incision looks good today seems to be healing well.  [facility] doctors asked me to check with you to see if you need any nausea medicine - we can offer rx Zofran dissolving tablet, if not improved let me know and we can order this rx.  Please schedule a Follow-up Appointment to: Return in about 2 weeks (around 05/20/2018), or if symptoms worsen or fail to improve, for short of breath, oxygen.  If you have any other questions or concerns, please feel free to call the office or send a message through [place]. You may also schedule an earlier appointment if necessary.  Additionally, you may be receiving a survey about your experience at our office within a few days to 1 week by e-mail or mail. We value your feedback.  [name], DO [place]

## 2018-05-07 ENCOUNTER — Encounter: Payer: Self-pay | Admitting: Family Medicine

## 2018-05-17 ENCOUNTER — Other Ambulatory Visit: Payer: Self-pay

## 2018-05-17 DIAGNOSIS — Z170 Estrogen receptor positive status [ER+]: Principal | ICD-10-CM

## 2018-05-17 DIAGNOSIS — C50211 Malignant neoplasm of upper-inner quadrant of right female breast: Secondary | ICD-10-CM

## 2018-06-02 ENCOUNTER — Other Ambulatory Visit: Payer: Medicare Other

## 2018-06-09 ENCOUNTER — Ambulatory Visit: Payer: Medicare Other | Admitting: General Surgery

## 2018-06-14 ENCOUNTER — Ambulatory Visit (INDEPENDENT_AMBULATORY_CARE_PROVIDER_SITE_OTHER): Payer: Medicare Other | Admitting: Family Medicine

## 2018-06-14 ENCOUNTER — Other Ambulatory Visit: Payer: Self-pay

## 2018-06-14 ENCOUNTER — Encounter: Payer: Self-pay | Admitting: Family Medicine

## 2018-06-14 VITALS — BP 174/125 | HR 71 | Resp 16 | Ht 62.0 in | Wt 130.0 lb

## 2018-06-14 DIAGNOSIS — I10 Essential (primary) hypertension: Secondary | ICD-10-CM

## 2018-06-14 IMAGING — MR MR ABDOMEN WO/W CM MRCP
10 of 22 series · 16 of 48 positions shown · IV contrast (13mL MULTIHANCE)
Comparison: CT scan 01/24/2016

CLINICAL DATA: Evaluate pancreatic mass. History of perforated
duodenal ulcer and pancreatic mass seen and surgery.

EXAM:
MRI ABDOMEN WITHOUT AND WITH CONTRAST (INCLUDING MRCP)
TECHNIQUE: Multiplanar multisequence MR imaging of the abdomen was performed
both before and after the administration of intravenous contrast.
Heavily T2-weighted images of the biliary and pancreatic ducts were
obtained, and three-dimensional MRCP images were rendered by post
processing.
CONTRAST:  13mL MULTIHANCE GADOBENATE DIMEGLUMINE 529 MG/ML IV SOLN

[Series 6: T2 · coronal · 8.0mm · 1.56mm/px · 1 of 25 slices shown (1 of 2)]
[im 1/25]
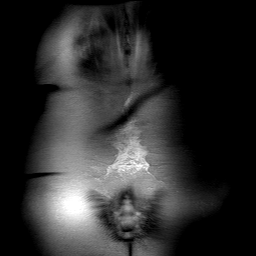

[Series 8: T2 fat-sat · axial · 7.0mm · 0.78mm/px · 1 of 33 slices shown (1 of 2)]
[im 1/33]
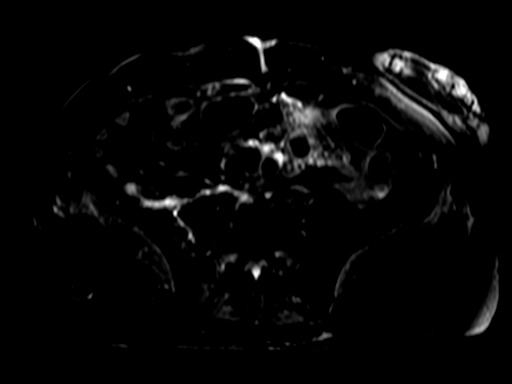

[Series 10: T2 · axial · 7.0mm · 1.56mm/px · 1 of 33 slices shown (2 of 2)]
[im 1/33]
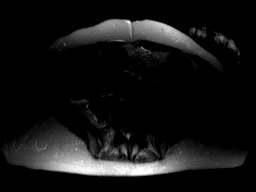

[Series 11: axial in-out of · axial · 7.0mm · 0.78mm/px · z∈[-57,+211]mm · 3 of 66 slices shown]
[im 1/66]
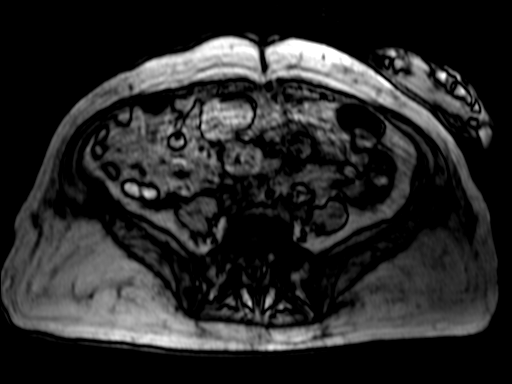
[im 33/66]
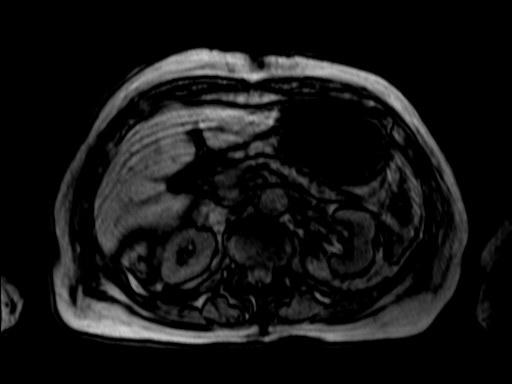
[im 66/66]
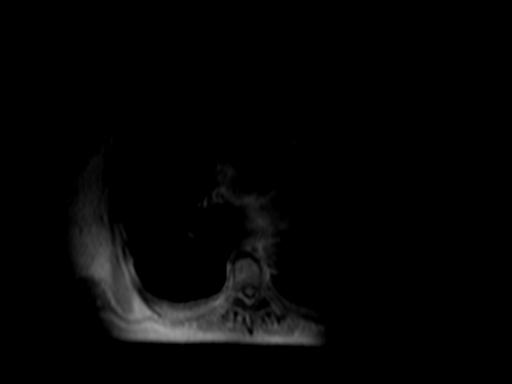

[Series 16: cor thins · coronal · 3.0mm · 0.74mm/px · 1 of 35 slices shown]
[im 1/35]
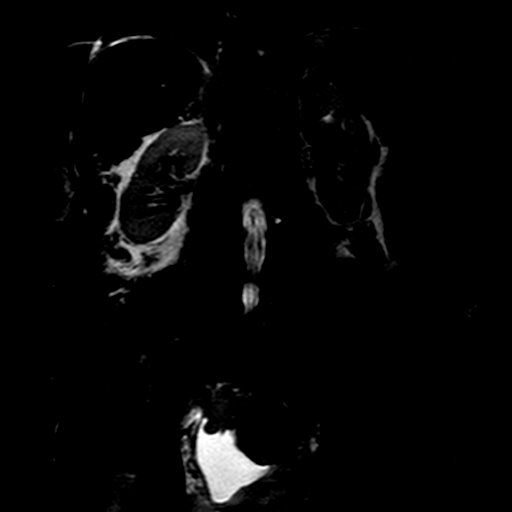

[Series 19: DWI · axial · 6.0mm · 3.12mm/px · z∈[-73,+208]mm · 4 of 115 slices shown]
[im 1/115]
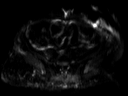
[im 39/115]
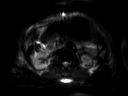
[im 77/115]
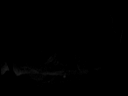
[im 115/115]
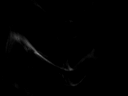

[Series 20: axial dwi_adc · axial · 6.0mm · 3.12mm/px · z∈[-73,+201]mm · 2 of 39 slices shown]
[im 1/39]
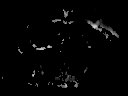
[im 39/39]
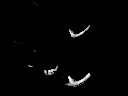

[Series 22: T2 fat-sat · coronal · 8.0mm · 1.56mm/px · 1 of 25 slices shown (2 of 2)]
[im 1/25]
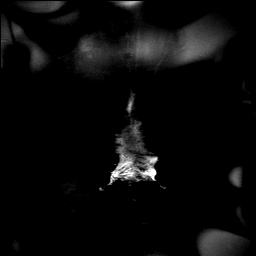

[Series 23: MRCP · sagittal · 40.0mm · 0.91mm/px · 1 of 6 slices shown]
[im 1/6]
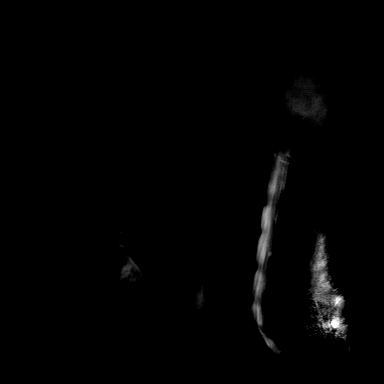

[Series 35: T1 fat-sat post-contrast · coronal · 8.0mm · 0.78mm/px · 1 of 25 slices shown]
[im 1/25]
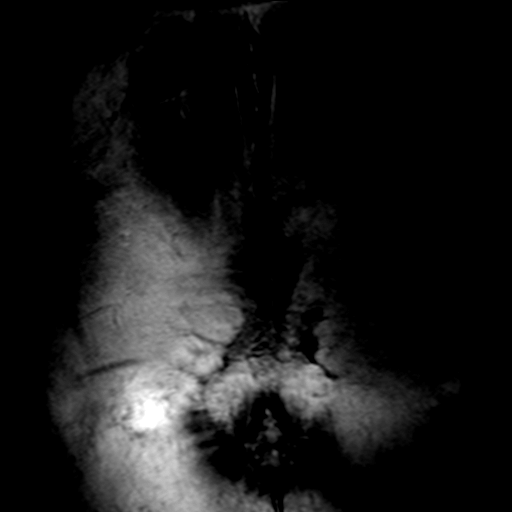

[16 of 48 positions shown; findings below may reference images not displayed]

FINDINGS: Lower chest: Small bilateral pleural effusions and overlying
atelectasis. No pericardial effusion.

Hepatobiliary: No focal hepatic lesions or intrahepatic biliary
dilatation. Normal caliber and course of the common bile duct. No
common bile duct stones, mass or obstruction. The gallbladder is
surgically absent.

Pancreas: Very limited examination due to motion artifact.
Indistinct soft tissue fullness noted along the posterior aspect of
the lower pancreatic head adjacent to the duodenum but very
difficult to identified and measure a discrete mass. Normal caliber
and course of the main pancreatic duct. The pancreatic body and tail
show mild atrophy but no mass.

Spleen:  Normal size.  No focal lesions.

Adrenals/Urinary Tract:  No significant findings.

Stomach/Bowel: The stomach is moderately distended. There is
persistent inflammatory type changes involving the second portion
duodenum along with a moderate-sized duodenum diverticulum. No new
adrenal obstruction.

Vascular/Lymphatic: The major vascular structures are patent. The
aorta and branch vessels are unremarkable. There are small
gastrohepatic ligament and periportal lymph nodes.

Other:  Small amount of free abdominal fluid.

Musculoskeletal: No significant findings.
IMPRESSION: 1. Limited examination due to patient motion.
2. Ill-defined soft tissue fullness between the pancreatic head and
duodenum likely reflecting the mass abuts identified at surgery.
Dedicated pancreatic CT protocol may be helpful for further
evaluation and surgical planning.
3. Small gastrohepatic ligament periportal and celiac axis lymph
nodes are indeterminate.
4. Distended stomach but no obvious doing renal obstruction.
Moderate-sized duodenum diverticulum.
5. No definite MR findings for metastatic disease.
6. Bilateral pleural effusions and mild to moderate free abdominal
fluid.

## 2018-06-14 MED ORDER — AMLODIPINE BESYLATE 5 MG PO TABS: 5.0000 mg | ORAL_TABLET | Freq: Every day | ORAL | 1 refills | Status: DC

## 2018-06-14 NOTE — Patient Instructions (Addendum)
New medicine Amlodipine 5mg  daily - sent to [facility] today - take one every day Continue Losartan 100mg  daily  Call us if question - or if BP not improved - we may consider DOUBLE amlodipine to 2 pills at once = 10mg   If not improved but persistent elevated BP, and no new symptoms - can call us and we will recommend that you schedule a Virtual Telephone visit with Dr Fletcher Anon at Cardiology  If worsening symptoms - headache, loss of vision, pass out, dizzy lightheaded chest pain or short of breath - then next option is hospital emergency dept 911 for more immediate medical attention  If you have any other questions or concerns, please feel free to call the office or send a message through [place]. You may also schedule an earlier appointment if necessary.  Additionally, you may be receiving a survey about your experience at our office within a few days to 1 week by e-mail or mail. We value your feedback.  [name], DO [place]

## 2018-06-14 NOTE — Assessment & Plan Note (Addendum)
Acute elevated BP, uncertain trigger, associated with mild headache Suspect now higher BP off other anti HTN meds over past 2-3 months, otherwise can be other triggers with stress but no clear cause at this time - Home BP readings elevated, not in office Elevated Cr w/ CKD III Followed by Cardiology Dr Fletcher Anon    Plan:  1. ADD BACK - Amlodipine 5mg  daily - new rx, she will have it picked up today - start today, keep taking daily along with Losartan 100mg  daily for 24-48 hours, monitor BP and if elevated >160/100 still then can double amlodipine up to TWO pills = 10mg  daily - call if question - We agreed to try to manage her HTN now remotely and try to limit hospital ED exposure if possible due to risk of COVID19 pandemic  - If not improving - but feels fine - can call us and then call Cardiology to schedule virtual visit with Dr Fletcher Anon if available for 2nd opinion - If any significant worsening or new symptoms as discussed specifically - she is to go to hospital ED for further management and testing if indicated

## 2018-06-14 NOTE — Progress Notes (Signed)
Virtual Visit via Telephone The purpose of this virtual visit is to provide medical care while limiting exposure to the novel coronavirus (COVID19) for both patient and office staff.  Consent was obtained for phone visit:  Yes.   Answered questions that patient had about telehealth interaction:  Yes.   I discussed the limitations, risks, security and privacy concerns of performing an evaluation and management service by telephone. I also discussed with the patient that there may be a patient responsible charge related to this service. The patient expressed understanding and agreed to proceed.  Patient Location: Home Provider Location: [facility])  ---------------------------------------------------------------------- Chief Complaint  Patient presents with  . Hypertension    B/P 169/113 mm/hg onset Today, light HA, denies blurred vision    S: Reviewed CMA telephone note below. I have called patient and gathered additional HPI as follows:  CHRONIC HTN: - Last visit with me 04/2018 for hospital follow-up, also saw [facility] Dr Fletcher Anon 03/2018 and due to syncopal vasovagal episode they stopped her HCTZ and increased her Losartan up to 100mg , see prior notes for background information. - Today patient reports now elevated BP acutely past 24 hours suspected with BP up to 169/113, and recheck BP 170/120, admits mild headache, otherwise feels fine. She does not endorse anything in particular that would cause her BP to be elevated now - She already took her Losartan 100mg  daily today Current Meds -Losartan 100mg  daily - Remains off HCTZ, Amlodipine Reports good compliance, took meds today. Tolerating well, w/o complaints. Improved exercise / walking - less recently due to [place] concerns Admits some life stressors but nothing significantly new  Patient is currently isolation Denies any high risk travel to areas of current concern for COVID19. Denies any known or  suspected exposure to person with or possibly with COVID19.  Denies any fevers, chills, sweats, body ache, cough, shortness of breath, sinus pain or pressure, abdominal pain, diarrhea, chest pain, syncope, dizziness lightheadedness  Past Medical History:  Diagnosis Date  . Arthritis   . Asthma   . Breast cancer ([place]) 2018  . Cancer ([place]) 03/27/2016   Invasive mammary carcinoma right breast Stage 1 ER/PR positive  . Chronic bronchitis ([place])   . Collagen vascular disease ([place])   . Depression   . Diverticulosis   . GERD (gastroesophageal reflux disease)   . High cholesterol   . Hypertension   . Personal history of radiation therapy   . Seasonal allergies    Social History   Tobacco Use  . Smoking status: Former Smoker    Packs/day: 0.50    Years: 10.00    Pack years: 5.00    Types: Cigarettes    Last attempt to quit: 1988    Years since quitting: 32.3  . Smokeless tobacco: Former Systems developer  . Tobacco comment: quit 1988  Substance Use Topics  . Alcohol use: No    Alcohol/week: 0.0 standard drinks    Comment: wine twice a month   . Drug use: No    Current Outpatient Medications:  .  acetaminophen (TYLENOL) 325 MG tablet, Take 650 mg by mouth every 6 (six) hours as needed for mild pain., Disp: , Rfl:  .  albuterol (PROVENTIL HFA;VENTOLIN HFA) 108 (90 Base) MCG/ACT inhaler, Inhale 2 puffs into the lungs every 6 (six) hours as needed for wheezing or shortness of breath., Disp: , Rfl:  .  ALBUTEROL IN, Inhale 2 Inhalers into the lungs every 6 (six) hours as needed., Disp: ,  Rfl:  .  bacitracin ointment, Apply to affected (left wrist) area daily, Disp: 30 g, Rfl: 0 .  budesonide-formoterol (SYMBICORT) 160-4.5 MCG/ACT inhaler, Inhale 1 puff into the lungs 2 (two) times daily., Disp: 1 Inhaler, Rfl: 12 .  calcium-vitamin D (OSCAL WITH D) 500-200 MG-UNIT TABS tablet, Take by mouth., Disp: , Rfl:  .  diclofenac sodium (VOLTAREN) 1 % GEL, Apply 3 grams topically qid prn, Disp: , Rfl:  .   diphenhydramine-acetaminophen (TYLENOL PM) 25-500 MG TABS tablet, Take 1-2 tablets by mouth at bedtime as needed (SLEEP). 1-2 TABLETS DEPENDING ON DIFFICULTLY SLEEPING, Disp: , Rfl:  .  feeding supplement (BOOST / RESOURCE BREEZE) LIQD, Take 1 Container by mouth 3 (three) times daily between meals., Disp: , Rfl: 0 .  fluticasone (FLONASE) 50 MCG/ACT nasal spray, Place 2 sprays into both nostrils daily. Use for 4-6 weeks then stop and use seasonally or as needed., Disp: 16 g, Rfl: 3 .  folic acid (FOLVITE) 1 MG tablet, Take by mouth., Disp: , Rfl:  .  gabapentin (NEURONTIN) 300 MG capsule, TAKE 1 CAPSULE EVERY MORNING, 1 AT [facility] AT DINNER AND 2 CAPSULES ATBEDTIME, Disp: 150 capsule, Rfl: 5 .  HYDROcodone-acetaminophen (NORCO/VICODIN) 5-325 MG tablet, Take 1 tablet by mouth every 6 (six) hours as needed for moderate pain., Disp: 20 tablet, Rfl: 0 .  losartan (COZAAR) 100 MG tablet, Take 1 tablet (100 mg total) by mouth daily., Disp: 90 tablet, Rfl: 3 .  Melatonin 10 MG TABS, Take 2 tablets by mouth at bedtime. Reported on 06/03/2015, Disp: , Rfl:  .  montelukast (SINGULAIR) 10 MG tablet, TAKE 1 TABLET BY MOUTH ONCE DAILY, Disp: 30 tablet, Rfl: 5 .  Multiple Vitamin (MULTI-VITAMINS) TABS, Take by mouth., Disp: , Rfl:  .  omeprazole (PRILOSEC) 40 MG capsule, Take 1 capsule (40 mg total) by mouth daily before breakfast., Disp: 90 capsule, Rfl: 1 .  simvastatin (ZOCOR) 20 MG tablet, Take 1 tablet every other night., Disp: 30 tablet, Rfl: 12 .  zolpidem (AMBIEN) 5 MG tablet, Take 1 tablet (5 mg total) by mouth at bedtime as needed for sleep., Disp: 30 tablet, Rfl: 2 .  Adalimumab (HUMIRA PEN) 40 MG/0.8ML PNKT, Inject 40 mg into the skin every 21 ( twenty-one) days. TAKES EVERY OTHER WEEK  LAST DOSE WAS IN 01-2016, Disp: , Rfl:  .  amLODipine (NORVASC) 5 MG tablet, Take 1 tablet (5 mg total) by mouth daily., Disp: 90 tablet, Rfl: 1 .  letrozole (FEMARA) 2.5 MG tablet, Take 1 tablet (2.5 mg total) by mouth  daily. (Patient not taking: Reported on 06/14/2018), Disp: 90 tablet, Rfl: 3 .  Omega 3 1000 MG CAPS, Take 1,000 mg by mouth daily. omega-3 fatty acids-vitamin E (FISH OIL) 1,000 mg, Disp: , Rfl:  .  polyethylene glycol powder (GLYCOLAX/MIRALAX) powder, Take 17-34 g by mouth daily as needed for moderate constipation. (Patient not taking: Reported on 06/14/2018), Disp: 250 g, Rfl: 1 .  ZINC OXIDE, TOPICAL, 10 % CREA, Apply 1 application topically every 3 (three) hours as needed (perineal irritation). (Patient not taking: Reported on 06/14/2018), Disp: 113 g, Rfl: 2  Depression screen [facility] 2/9 06/14/2018 05/06/2018 04/13/2018  Decreased Interest 0 0 0  Down, Depressed, Hopeless 0 0 0  PHQ - 2 Score 0 0 0  Altered sleeping - - -  Tired, decreased energy - - -  Change in appetite - - -  Feeling bad or failure about yourself  - - -  Trouble concentrating - - -  Moving slowly or fidgety/restless - - -  Suicidal thoughts - - -  PHQ-9 Score - - -  Difficult doing work/chores - - -    No flowsheet data found.  -------------------------------------------------------------------------- O: No physical exam performed due to remote telephone encounter.  Lab results reviewed.  Recent Results (from the past 2160 hour(s))  TSH     Status: None   Collection Time: 04/07/18  8:03 AM  Result Value Ref Range   TSH 1.66 0.40 - 4.50 mIU/L  Lipid panel     Status: Abnormal   Collection Time: 04/07/18  8:03 AM  Result Value Ref Range   Cholesterol 203 (H) <200 mg/dL   HDL 59 > OR = 50 mg/dL   Triglycerides 99 <150 mg/dL   LDL Cholesterol (Calc) 123 (H) mg/dL (calc)    Comment: Reference range: <100 . Desirable range <100 mg/dL for primary prevention;   <70 mg/dL for patients with CHD or diabetic patients  with > or = 2 CHD risk factors. Marland Kitchen LDL-C is now calculated using the Martin-Hopkins  calculation, which is a validated novel method providing  better accuracy than the Friedewald equation in the   estimation of LDL-C.  Cresenciano Genre et al. Annamaria Helling. 9563;875(64): 2061-2068  (http://education.[employer].com/faq/FAQ164)    Total CHOL/HDL Ratio 3.4 <5.0 (calc)   Non-HDL Cholesterol (Calc) 144 (H) <130 mg/dL (calc)    Comment: For patients with diabetes plus 1 major ASCVD risk  factor, treating to a non-HDL-C goal of <100 mg/dL  (LDL-C of <70 mg/dL) is considered a therapeutic  option.   COMPLETE METABOLIC PANEL WITH GFR     Status: Abnormal   Collection Time: 04/07/18  8:03 AM  Result Value Ref Range   Glucose, Bld 100 65 - 139 mg/dL    Comment: .        Non-fasting reference interval .    BUN 15 7 - 25 mg/dL   Creat 0.97 (H) 0.60 - 0.93 mg/dL    Comment: For patients >18 years of age, the reference limit for Creatinine is approximately 13% higher for people identified as African-American. .    GFR, Est Non African American 57 (L) > OR = 60 mL/min/1.48m2   GFR, Est African American 66 > OR = 60 mL/min/1.63m2   BUN/Creatinine Ratio 15 6 - 22 (calc)   Sodium 138 135 - 146 mmol/L   Potassium 4.5 3.5 - 5.3 mmol/L   Chloride 103 98 - 110 mmol/L   CO2 26 20 - 32 mmol/L   Calcium 9.5 8.6 - 10.4 mg/dL   Total Protein 7.1 6.1 - 8.1 g/dL   Albumin 4.0 3.6 - 5.1 g/dL   Globulin 3.1 1.9 - 3.7 g/dL (calc)   AG Ratio 1.3 1.0 - 2.5 (calc)   Total Bilirubin 0.7 0.2 - 1.2 mg/dL   Alkaline phosphatase (APISO) 71 37 - 153 U/L   AST 16 10 - 35 U/L   ALT 8 6 - 29 U/L  CBC with Differential/Platelet     Status: Abnormal   Collection Time: 04/07/18  8:03 AM  Result Value Ref Range   WBC 7.1 3.8 - 10.8 Thousand/uL   RBC 3.83 3.80 - 5.10 Million/uL   Hemoglobin 12.8 11.7 - 15.5 g/dL   HCT 37.6 35.0 - 45.0 %   MCV 98.2 80.0 - 100.0 fL   MCH 33.4 (H) 27.0 - 33.0 pg   MCHC 34.0 32.0 - 36.0 g/dL   RDW 12.3 11.0 - 15.0 %   Platelets 382  140 - 400 Thousand/uL   MPV 10.7 7.5 - 12.5 fL   Neutro Abs 3,117 1,500 - 7,800 cells/uL   Lymphs Abs 3,032 850 - 3,900 cells/uL   Absolute Monocytes  504 200 - 950 cells/uL   Eosinophils Absolute 419 15 - 500 cells/uL   Basophils Absolute 28 0 - 200 cells/uL   Neutrophils Relative % 43.9 %   Total Lymphocyte 42.7 %   Monocytes Relative 7.1 %   Eosinophils Relative 5.9 %   Basophils Relative 0.4 %  Hemoglobin A1c     Status: None   Collection Time: 04/07/18  8:03 AM  Result Value Ref Range   Hgb A1c MFr Bld 5.5 <5.7 % of total Hgb    Comment: For the purpose of screening for the presence of diabetes: . <5.7%       Consistent with the absence of diabetes 5.7-6.4%    Consistent with increased risk for diabetes             (prediabetes) > or =6.5%  Consistent with diabetes . This assay result is consistent with a decreased risk of diabetes. . Currently, no consensus exists regarding use of hemoglobin A1c for diagnosis of diabetes in children. . According to American Diabetes Association (ADA) guidelines, hemoglobin A1c <7.0% represents optimal control in non-pregnant diabetic patients. Different metrics may apply to specific patient populations.  Standards of Medical Care in Diabetes(ADA). .    Mean Plasma Glucose 111 (calc)   eAG (mmol/L) 6.2 (calc)    -------------------------------------------------------------------------- A&P:  Problem List Items Addressed This Visit    Hypertension - Primary    Acute elevated BP, uncertain trigger, associated with mild headache Suspect now higher BP off other anti HTN meds over past 2-3 months, otherwise can be other triggers with stress but no clear cause at this time - Home BP readings elevated, not in office Elevated Cr w/ CKD III Followed by Cardiology Dr Fletcher Anon    Plan:  1. ADD BACK - Amlodipine 5mg  daily - new rx, she will have it picked up today - start today, keep taking daily along with Losartan 100mg  daily for 24-48 hours, monitor BP and if elevated >160/100 still then can double amlodipine up to TWO pills = 10mg  daily - call if question - We agreed to try to manage  her HTN now remotely and try to limit hospital ED exposure if possible due to risk of COVID19 pandemic  - If not improving - but feels fine - can call us and then call Cardiology to schedule virtual visit with Dr Fletcher Anon if available for 2nd opinion - If any significant worsening or new symptoms as discussed specifically - she is to go to hospital ED for further management and testing if indicated      Relevant Medications   amLODipine (NORVASC) 5 MG tablet      Meds ordered this encounter  Medications  . amLODipine (NORVASC) 5 MG tablet    Sig: Take 1 tablet (5 mg total) by mouth daily.    Dispense:  90 tablet    Refill:  1    Follow-up: - Telephone update 24-48 hours - Return within 2-4 weeks HTN  Patient verbalizes understanding with the above medical recommendations including the limitation of remote medical advice.  Specific follow-up and call-back criteria were given for patient to follow-up or seek medical care more urgently if needed.   - Time spent in direct consultation with patient on phone: 13 minutes   [name], DO  [facility] 06/14/2018, 10:49 AM

## 2018-06-21 ENCOUNTER — Telehealth: Payer: Self-pay | Admitting: Family Medicine

## 2018-06-21 NOTE — Telephone Encounter (Signed)
Pt. have called back again requesting her oxygen tanks be removed from her house, said that she was tripping over her tanks

## 2018-06-21 NOTE — Telephone Encounter (Signed)
Patient is asking for [place] to help contact the PT or company who delivered her oxygen tanks so that they could pick them up.  I called her [facility] - and spoke with someone at [facility] [phone]  They will call the patient today and arrange pick up of her oxygen tanks.  [name], [facility] 06/21/2018, 9:53 AM

## 2018-06-24 ENCOUNTER — Telehealth: Payer: Self-pay | Admitting: Family Medicine

## 2018-06-24 NOTE — Telephone Encounter (Signed)
Patient wants [employer] to remove oxygen tank called and they scheduled an pick up today.

## 2018-06-27 ENCOUNTER — Ambulatory Visit (INDEPENDENT_AMBULATORY_CARE_PROVIDER_SITE_OTHER): Payer: Medicare Other | Admitting: Family Medicine

## 2018-06-27 ENCOUNTER — Encounter: Payer: Self-pay | Admitting: Family Medicine

## 2018-06-27 ENCOUNTER — Other Ambulatory Visit: Payer: Self-pay

## 2018-06-27 VITALS — BP 129/75

## 2018-06-27 DIAGNOSIS — R55 Syncope and collapse: Secondary | ICD-10-CM

## 2018-06-27 DIAGNOSIS — E782 Mixed hyperlipidemia: Secondary | ICD-10-CM

## 2018-06-27 DIAGNOSIS — S2243XS Multiple fractures of ribs, bilateral, sequela: Secondary | ICD-10-CM | POA: Diagnosis not present

## 2018-06-27 MED ORDER — SIMVASTATIN 20 MG PO TABS: 20.0000 mg | ORAL_TABLET | ORAL | 3 refills | Status: DC
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
## 2018-06-27 NOTE — Patient Instructions (Addendum)
Able to resume driving once cleared by [facility]  Refilled Zocor  We will fax paperwork to [facility]. Please call our office to check when you can pick up a copy of your form.  Please schedule a Follow-up Appointment to: Return if symptoms worsen or fail to improve.  If you have any other questions or concerns, please feel free to call the office or send a message through [place]. You may also schedule an earlier appointment if necessary.  Additionally, you may be receiving a survey about your experience at our office within a few days to 1 week by e-mail or mail. We value your feedback.  [name], DO [place]

## 2018-06-29 ENCOUNTER — Other Ambulatory Visit: Payer: Medicare Other

## 2018-06-30 ENCOUNTER — Other Ambulatory Visit: Payer: Self-pay | Admitting: Family Medicine

## 2018-06-30 DIAGNOSIS — J302 Other seasonal allergic rhinitis: Secondary | ICD-10-CM

## 2018-07-07 ENCOUNTER — Ambulatory Visit: Payer: Medicare Other | Admitting: General Surgery

## 2018-07-22 LAB — CANCER ANTIGEN 27.29: CA 27.29: 23.9 U/mL (ref 0.0–38.6)

## 2018-07-27 ENCOUNTER — Ambulatory Visit
Admission: RE | Admit: 2018-07-27 | Discharge: 2018-07-27 | Disposition: A | Discharge status: Home / Self Care | Payer: Medicare Other | Source: Ambulatory Visit | Attending: General Surgery | Admitting: General Surgery

## 2018-07-27 ENCOUNTER — Other Ambulatory Visit: Payer: Medicare Other

## 2018-07-27 ENCOUNTER — Other Ambulatory Visit: Payer: Self-pay

## 2018-07-27 DIAGNOSIS — C50211 Malignant neoplasm of upper-inner quadrant of right female breast: Secondary | ICD-10-CM

## 2018-07-27 DIAGNOSIS — Z170 Estrogen receptor positive status [ER+]: Secondary | ICD-10-CM

## 2018-08-02 ENCOUNTER — Other Ambulatory Visit: Payer: Self-pay

## 2018-08-02 ENCOUNTER — Ambulatory Visit (INDEPENDENT_AMBULATORY_CARE_PROVIDER_SITE_OTHER): Payer: Medicare Other | Admitting: General Surgery

## 2018-08-02 ENCOUNTER — Encounter: Payer: Self-pay | Admitting: General Surgery

## 2018-08-02 VITALS — BP 142/74 | HR 69 | Temp 97.5°F | Ht 62.0 in | Wt 132.0 lb

## 2018-08-02 DIAGNOSIS — C50211 Malignant neoplasm of upper-inner quadrant of right female breast: Secondary | ICD-10-CM

## 2018-08-02 DIAGNOSIS — Z170 Estrogen receptor positive status [ER+]: Secondary | ICD-10-CM | POA: Diagnosis not present

## 2018-08-02 NOTE — Progress Notes (Signed)
Patient ID: Elizabeth Bailey, female   DOB: 1940-11-13, 78 y.o.   MRN: [id number]  Chief Complaint  Patient presents with  . Follow-up    mammogram     HPI Elizabeth Bailey is a 78 y.o. female who presents for a breast evaluation. The most recent mammogram was done on 07/27/2018.  Patient does perform regular self breast checks and gets regular mammograms done.    HPI  Past Medical History:  Diagnosis Date  . Arthritis   . Asthma   . Breast cancer ([place]) 2018  . Cancer ([place]) 03/27/2016   Invasive mammary carcinoma right breast Stage 1 ER/PR positive  . Chronic bronchitis ([place])   . Collagen vascular disease ([place])   . Depression   . Diverticulosis   . GERD (gastroesophageal reflux disease)   . High cholesterol   . Hypertension   . Personal history of radiation therapy   . Seasonal allergies     Past Surgical History:  Procedure Laterality Date  . ABDOMINAL HYSTERECTOMY    . BREAST BIOPSY  1970's  . BREAST BIOPSY Right 03/27/2016   INVASIVE MAMMARY CARCINOMA  . BREAST LUMPECTOMY Right 04/09/2016   radiation  . BREAST LUMPECTOMY WITH SENTINEL LYMPH NODE BIOPSY Right 04/09/2016   Procedure: BREAST LUMPECTOMY WITH SENTINEL LYMPH NODE BX;  Surgeon: Christene Lye, MD;  Location: [facility] ORS;  Service: General;  Laterality: Right;  . CAROTID ENDARTERECTOMY Left   . CHOLECYSTECTOMY    . COLONOSCOPY  2005  . DG  BONE DENSITY ([place] HX)  2010  . DIAGNOSTIC MAMMOGRAM  2015  . ESOPHAGOGASTRODUODENOSCOPY (EGD) WITH PROPOFOL N/A 05/05/2016   Procedure: ESOPHAGOGASTRODUODENOSCOPY (EGD) WITH PROPOFOL;  Surgeon: Lucilla Lame, MD;  Location: [facility] ENDOSCOPY;  Service: Endoscopy;  Laterality: N/A;  . EUS N/A 03/12/2016   Procedure: FULL UPPER ENDOSCOPIC ULTRASOUND (EUS) RADIAL;  Surgeon: Holly Bodily, MD;  Location: [facility] ENDOSCOPY;  Service: Endoscopy;  Laterality: N/A;  . GALLBLADDER SURGERY    . PANCREAS SURGERY  01/24/2016   Dr Burt Knack  . pap smear  2014  . REPAIR OF PERFORATED ULCER  N/A 01/24/2016   Procedure: Exploratory Laparotomy with biopsy of Pancreas;  Surgeon: Florene Glen, MD;  Location: [facility] ORS;  Service: General;  Laterality: N/A;  . UPPER GI ENDOSCOPY  03/12/2016   Dr Mont Dutton    Family History  Problem Relation Age of Onset  . Pneumonia Mother   . Depression Mother   . Depression Sister   . Breast cancer Sister 49  . Diabetes Sister   . Stroke Brother   . Heart disease Brother     Social History Social History   Tobacco Use  . Smoking status: Former Smoker    Packs/day: 0.50    Years: 10.00    Pack years: 5.00    Types: Cigarettes    Last attempt to quit: 1988    Years since quitting: 32.4  . Smokeless tobacco: Former Systems developer  . Tobacco comment: quit 1988  Substance Use Topics  . Alcohol use: No    Alcohol/week: 0.0 standard drinks    Comment: wine twice a month   . Drug use: No    Allergies  Allergen Reactions  . Flucytosine Other (See Comments)    Sore mouth Sore mouth  . Fluticasone-Salmeterol Other (See Comments)    Other reaction(s): Other (See Comments) Sore mouth  . Naproxen Nausea Only    GI upset  . Other Other (See Comments)    Frequent urination Other  reaction(s): Other (See Comments) Frequent urination Other reaction(s): Other (See Comments) Frequent urination Frequent urination    Current Outpatient Medications  Medication Sig Dispense Refill  . acetaminophen (TYLENOL) 325 MG tablet Take 650 mg by mouth every 6 (six) hours as needed for mild pain.    . Adalimumab (HUMIRA PEN) 40 MG/0.8ML PNKT Inject 40 mg into the skin every 21 ( twenty-one) days. TAKES EVERY OTHER WEEK  LAST DOSE WAS IN 01-2016    . albuterol (PROVENTIL HFA;VENTOLIN HFA) 108 (90 Base) MCG/ACT inhaler Inhale 2 puffs into the lungs every 6 (six) hours as needed for wheezing or shortness of breath.    . ALBUTEROL IN Inhale 2 Inhalers into the lungs every 6 (six) hours as needed.    Marland Kitchen amLODipine (NORVASC) 5 MG tablet Take 1 tablet (5 mg  total) by mouth daily. 90 tablet 1  . bacitracin ointment Apply to affected (left wrist) area daily 30 g 0  . budesonide-formoterol (SYMBICORT) 160-4.5 MCG/ACT inhaler Inhale 1 puff into the lungs 2 (two) times daily. 1 Inhaler 12  . calcium-vitamin D (OSCAL WITH D) 500-200 MG-UNIT TABS tablet Take by mouth.    . diclofenac sodium (VOLTAREN) 1 % GEL Apply 3 grams topically qid prn    . diphenhydramine-acetaminophen (TYLENOL PM) 25-500 MG TABS tablet Take 1-2 tablets by mouth at bedtime as needed (SLEEP). 1-2 TABLETS DEPENDING ON DIFFICULTLY SLEEPING    . feeding supplement (BOOST / RESOURCE BREEZE) LIQD Take 1 Container by mouth 3 (three) times daily between meals.  0  . fluticasone (FLONASE) 50 MCG/ACT nasal spray Place 2 sprays into both nostrils daily. Use for 4-6 weeks then stop and use seasonally or as needed. 16 g 3  . folic acid (FOLVITE) 1 MG tablet Take by mouth.    . gabapentin (NEURONTIN) 300 MG capsule TAKE 1 CAPSULE EVERY MORNING, 1 AT [facility] AT DINNER AND 2 CAPSULES ATBEDTIME 150 capsule 5  . HYDROcodone-acetaminophen (NORCO/VICODIN) 5-325 MG tablet Take 1 tablet by mouth every 6 (six) hours as needed.    Marland Kitchen letrozole (FEMARA) 2.5 MG tablet Take 1 tablet (2.5 mg total) by mouth daily. 90 tablet 3  . losartan (COZAAR) 100 MG tablet Take 1 tablet (100 mg total) by mouth daily. 90 tablet 3  . Melatonin 10 MG TABS Take 2 tablets by mouth at bedtime. Reported on 06/03/2015    . montelukast (SINGULAIR) 10 MG tablet TAKE 1 TABLET BY MOUTH ONCE DAILY 30 tablet 5  . Multiple Vitamin (MULTI-VITAMINS) TABS Take by mouth.    . Omega 3 1000 MG CAPS Take 1,000 mg by mouth daily. omega-3 fatty acids-vitamin E (FISH OIL) 1,000 mg    . omeprazole (PRILOSEC) 40 MG capsule Take 1 capsule (40 mg total) by mouth daily before breakfast. 90 capsule 1  . simvastatin (ZOCOR) 20 MG tablet Take 1 tablet (20 mg total) by mouth every other day. Take at bedtime 45 tablet 3  . ZINC OXIDE, TOPICAL, 10 % CREA Apply 1  application topically every 3 (three) hours as needed (perineal irritation). 113 g 2  . zolpidem (AMBIEN) 5 MG tablet Take 1 tablet (5 mg total) by mouth at bedtime as needed for sleep. 30 tablet 2   No current facility-administered medications for this visit.     Review of Systems Review of Systems  Constitutional: Negative.   Respiratory: Negative.   Cardiovascular: Negative.     Blood pressure (!) 142/74, pulse 69, temperature (!) 97.5 F (36.4 C), temperature source Skin,  height 5\' 2"  (1.575 m), weight 132 lb (59.9 kg), SpO2 97 %.  Physical Exam Physical Exam Exam conducted with a chaperone present.  Constitutional:      Appearance: She is well-developed.  Eyes:     General: No scleral icterus.    Conjunctiva/sclera: Conjunctivae normal.  Neck:     Musculoskeletal: Neck supple.  Cardiovascular:     Rate and Rhythm: Normal rate and regular rhythm.     Heart sounds: Normal heart sounds.  Pulmonary:     Effort: Pulmonary effort is normal.     Breath sounds: Normal breath sounds.  Chest:     Breasts:        Right: Normal.        Left: Normal.    Lymphadenopathy:     Cervical: No cervical adenopathy.  Skin:    General: Skin is warm and dry.  Neurological:     Mental Status: She is alert and oriented to person, place, and time.     Data Reviewed Bilateral diagnostic mammograms completed Jul 27, 2018 were independently reviewed.  Postsurgical changes.  BI-RADS-2.  Assessment Doing well now 2 years status post breast conservation surgery on the right.  Early radiation changes in the skin around the biopsy site.  Plan  The patient has been asked to return to the office in one year with a bilateral diagnostic mammogram.The patient is aware to call back for any questions or concerns.   HPI, Physical Exam, Assessment and Plan have been scribed under the direction and in the presence of Hervey Ard, MD.  Elizabeth Bailey, CMA  I have completed the exam and  reviewed the above documentation for accuracy and completeness.  I agree with the above.  Haematologist has been used and any errors in dictation or transcription are unintentional.  Hervey Ard, M.D., F.A.C.S.  Forest Gleason Ariellah Faust 08/03/2018, 9:09 AM

## 2018-08-03 ENCOUNTER — Encounter: Payer: Self-pay | Admitting: General Surgery

## 2018-08-11 ENCOUNTER — Encounter: Payer: Self-pay | Admitting: Radiation Oncology

## 2018-08-11 ENCOUNTER — Encounter: Payer: Self-pay | Admitting: Family Medicine

## 2018-08-11 ENCOUNTER — Ambulatory Visit
Admission: RE | Admit: 2018-08-11 | Discharge: 2018-08-11 | Disposition: A | Discharge status: Home / Self Care | Payer: Medicare Other | Source: Ambulatory Visit | Attending: Radiation Oncology | Admitting: Radiation Oncology

## 2018-08-11 ENCOUNTER — Other Ambulatory Visit: Payer: Self-pay

## 2018-08-11 ENCOUNTER — Ambulatory Visit (INDEPENDENT_AMBULATORY_CARE_PROVIDER_SITE_OTHER): Payer: Medicare Other | Admitting: Family Medicine

## 2018-08-11 VITALS — BP 110/72 | HR 76 | Temp 97.7°F | Wt 128.7 lb

## 2018-08-11 VITALS — BP 114/64 | HR 77

## 2018-08-11 DIAGNOSIS — Z79811 Long term (current) use of aromatase inhibitors: Secondary | ICD-10-CM | POA: Insufficient documentation

## 2018-08-11 DIAGNOSIS — Z923 Personal history of irradiation: Secondary | ICD-10-CM | POA: Diagnosis not present

## 2018-08-11 DIAGNOSIS — N761 Subacute and chronic vaginitis: Secondary | ICD-10-CM | POA: Diagnosis not present

## 2018-08-11 DIAGNOSIS — N824 Other female intestinal-genital tract fistulae: Secondary | ICD-10-CM

## 2018-08-11 DIAGNOSIS — C50211 Malignant neoplasm of upper-inner quadrant of right female breast: Secondary | ICD-10-CM | POA: Diagnosis present

## 2018-08-11 DIAGNOSIS — Z170 Estrogen receptor positive status [ER+]: Secondary | ICD-10-CM | POA: Diagnosis not present

## 2018-08-11 MED ORDER — FLUCONAZOLE 150 MG PO TABS: ORAL_TABLET | ORAL | 0 refills | Status: DC

## 2018-08-11 MED ORDER — AMOXICILLIN-POT CLAVULANATE 875-125 MG PO TABS: 1.0000 | ORAL_TABLET | Freq: Two times a day (BID) | ORAL | 0 refills | Status: DC

## 2018-08-11 NOTE — Progress Notes (Signed)
Radiation Oncology Follow up Note  Name: Elizabeth Bailey   Date:   08/11/2018 MRN:  [id number] DOB: Oct 14, 1940    This 78 y.o. 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  She is seen today in routine follow-up and is doing well specifically denies breast tenderness cough or bone pain..  She had mammograms last month which I have reviewed which were benign showing no evidence of disease.  She is currently on letrozole tolerating that well without side effect.  COMPLICATIONS OF TREATMENT: none  FOLLOW UP COMPLIANCE: keeps appointments   PHYSICAL EXAM:  BP 110/72   Pulse 76   Temp 97.7 F (36.5 C)   Wt 128 lb 12 oz (58.4 kg)   BMI 23.55 kg/m  Lungs are clear to A&P cardiac examination essentially unremarkable with regular rate and rhythm. No dominant mass or nodularity is noted in either breast in 2 positions examined. Incision is well-healed. No axillary or supraclavicular adenopathy is appreciated. Cosmetic result is excellent.  Well-developed well-nourished patient in NAD. HEENT reveals PERLA, EOMI, discs not visualized.  Oral cavity is clear. No oral mucosal lesions are identified. Neck is clear without evidence of cervical or supraclavicular adenopathy. Lungs are clear to A&P. Cardiac examination is essentially unremarkable with regular rate and rhythm without murmur rub or thrill. Abdomen is benign with no organomegaly or masses noted. Motor sensory and DTR levels are equal and symmetric in the upper and lower extremities. Cranial nerves II through XII are grossly intact. Proprioception is intact. No peripheral adenopathy or edema is identified. No motor or  sensory levels are noted. Crude visual fields are within normal range.  RADIOLOGY RESULTS: Mammograms reviewed compatible with above-stated findings  PLAN: Present time patient is doing well with no evidence of disease now 2 years out.  I am pleased with her overall progress.  She continues on letrozole without side effect.  I have asked to see her back in 1 year for follow-up.  Patient knows to call with any concerns.  I would like to take this opportunity to thank you for allowing me to participate in the care of your patient.Noreene Filbert, MD

## 2018-08-11 NOTE — Patient Instructions (Addendum)
Start antibiotics - Augmentin twice a day for 10 days  After augmentin, then start the Diflucan yeast antibiotic - take 1 pill then skip a day then take 2nd pill  Please follow up with [facility]  Kathline Magic, MD  [address]  [facility] # [address]  [place], [place] [address]  [phone]   If they prefer you can see a GYN provider locally or there. Let me know what you need  Please schedule a Follow-up Appointment to: Return in about 2 weeks (around 08/25/2018), or if symptoms worsen or fail to improve, for vaginal infection.  If you have any other questions or concerns, please feel free to call the office or send a message through [place]. You may also schedule an earlier appointment if necessary.  Additionally, you may be receiving a survey about your experience at our office within a few days to 1 week by e-mail or mail. We value your feedback.  [name], DO [place]

## 2018-08-11 NOTE — Progress Notes (Signed)
Virtual Visit via Telephone The purpose of this virtual visit is to provide medical care while limiting exposure to the novel coronavirus (COVID19) for both patient and office staff.  Consent was obtained for phone visit:  Yes.   Answered questions that patient had about telehealth interaction:  Yes.   I discussed the limitations, risks, security and privacy concerns of performing an evaluation and management service by telephone. I also discussed with the patient that there may be a patient responsible charge related to this service. The patient expressed understanding and agreed to proceed.  Patient Location: Home Provider Location: [facility])  ---------------------------------------------------------------------- Chief Complaint  Patient presents with  . Vaginal Pain    redness on the labia. Also some yellowish discharge. x 1 month. Pt state she had this same thing to happen about a 1.5 mths ago when she was in the hospital and it was treated with a cream.      S: Reviewed CMA documentation. 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  Denies any high risk travel to areas of current concern for COVID19. Denies any known or suspected exposure to person with or possibly with COVID19.  Denies any fevers, chills, sweats, body ache, cough, shortness of breath, sinus pain or pressure, headache, abdominal pain, diarrhea  Past Medical History:  Diagnosis Date  . Arthritis   . Asthma   . Breast cancer ([place]) 03/27/2016   Invasive mammary  carcinoma right breast pT1c  pN0 (12 mm) MAMMOPRINT: LOW RISK; ER/PR positive  DCIS medial margin 1 mm  . Chronic bronchitis ([place])   . Collagen vascular disease ([place])   . Depression   . Diverticulosis   . GERD (gastroesophageal reflux disease)   . High cholesterol   . Hypertension   . Personal history of radiation therapy   . Seasonal allergies    Social History   Tobacco Use  . Smoking status: Former Smoker    Packs/day: 0.50    Years: 10.00    Pack years: 5.00    Types: Cigarettes    Quit date: 1988    Years since quitting: 32.4  . Smokeless tobacco: Former Systems developer  . Tobacco comment: quit 1988  Substance Use Topics  . Alcohol use: No    Alcohol/week: 0.0 standard drinks    Comment: wine twice a month   . Drug use: No    Current Outpatient Medications:  .  acetaminophen (TYLENOL) 325 MG tablet, Take 650 mg by mouth every 6 (six) hours as needed for mild pain., Disp: , Rfl:  .  Adalimumab (HUMIRA PEN) 40 MG/0.8ML PNKT, Inject 40 mg into the skin every 21 ( twenty-one) days. TAKES EVERY OTHER WEEK  LAST DOSE WAS IN 01-2016, Disp: , Rfl:  .  albuterol (PROVENTIL HFA;VENTOLIN HFA) 108 (90 Base) MCG/ACT inhaler, Inhale 2 puffs into the lungs every 6 (six) hours as needed for wheezing or shortness of breath., Disp: , Rfl:  .  ALBUTEROL IN, Inhale 2 Inhalers into the lungs every 6 (six) hours as needed., Disp: , Rfl:  .  amLODipine (NORVASC) 5 MG tablet,  Take 1 tablet (5 mg total) by mouth daily., Disp: 90 tablet, Rfl: 1 .  bacitracin ointment, Apply to affected (left wrist) area daily, Disp: 30 g, Rfl: 0 .  budesonide-formoterol (SYMBICORT) 160-4.5 MCG/ACT inhaler, Inhale 1 puff into the lungs 2 (two) times daily., Disp: 1 Inhaler, Rfl: 12 .  diclofenac sodium (VOLTAREN) 1 % GEL, Apply 3 grams topically qid prn, Disp: , Rfl:  .  feeding supplement (BOOST / RESOURCE BREEZE) LIQD, Take 1 Container by mouth 3 (three) times daily between meals., Disp: , Rfl: 0 .  fluticasone (FLONASE) 50  MCG/ACT nasal spray, Place 2 sprays into both nostrils daily. Use for 4-6 weeks then stop and use seasonally or as needed., Disp: 16 g, Rfl: 3 .  gabapentin (NEURONTIN) 300 MG capsule, TAKE 1 CAPSULE EVERY MORNING, 1 AT [facility] AT DINNER AND 2 CAPSULES ATBEDTIME, Disp: 150 capsule, Rfl: 5 .  HYDROcodone-acetaminophen (NORCO/VICODIN) 5-325 MG tablet, Take 1 tablet by mouth every 6 (six) hours as needed., Disp: , Rfl:  .  letrozole (FEMARA) 2.5 MG tablet, Take 1 tablet (2.5 mg total) by mouth daily., Disp: 90 tablet, Rfl: 3 .  losartan (COZAAR) 100 MG tablet, Take 1 tablet (100 mg total) by mouth daily., Disp: 90 tablet, Rfl: 3 .  Melatonin 10 MG TABS, Take 2 tablets by mouth at bedtime. Reported on 06/03/2015, Disp: , Rfl:  .  montelukast (SINGULAIR) 10 MG tablet, TAKE 1 TABLET BY MOUTH ONCE DAILY, Disp: 30 tablet, Rfl: 5 .  Multiple Vitamin (MULTI-VITAMINS) TABS, Take by mouth., Disp: , Rfl:  .  omeprazole (PRILOSEC) 40 MG capsule, Take 1 capsule (40 mg total) by mouth daily before breakfast., Disp: 90 capsule, Rfl: 1 .  simvastatin (ZOCOR) 20 MG tablet, Take 1 tablet (20 mg total) by mouth every other day. Take at bedtime, Disp: 45 tablet, Rfl: 3 .  ZINC OXIDE, TOPICAL, 10 % CREA, Apply 1 application topically every 3 (three) hours as needed (perineal irritation)., Disp: 113 g, Rfl: 2 .  zolpidem (AMBIEN) 5 MG tablet, Take 1 tablet (5 mg total) by mouth at bedtime as needed for sleep., Disp: 30 tablet, Rfl: 2 .  amoxicillin-clavulanate (AUGMENTIN) 875-125 MG tablet, Take 1 tablet by mouth 2 (two) times daily., Disp: 20 tablet, Rfl: 0 .  calcium-vitamin D (OSCAL WITH D) 500-200 MG-UNIT TABS tablet, Take by mouth., Disp: , Rfl:  .  diphenhydramine-acetaminophen (TYLENOL PM) 25-500 MG TABS tablet, Take 1-2 tablets by mouth at bedtime as needed (SLEEP). 1-2 TABLETS DEPENDING ON DIFFICULTLY SLEEPING, Disp: , Rfl:  .  fluconazole (DIFLUCAN) 150 MG tablet, Take one tablet by mouth on Day 1. Repeat dose 2nd  tablet on Day 3., Disp: 2 tablet, Rfl: 0 .  folic acid (FOLVITE) 1 MG tablet, Take by mouth., Disp: , Rfl:  .  Omega 3 1000 MG CAPS, Take 1,000 mg by mouth daily. omega-3 fatty acids-vitamin E (FISH OIL) 1,000 mg, Disp: , Rfl:   Depression screen [facility] 2/9 06/14/2018 05/06/2018 04/13/2018  Decreased Interest 0 0 0  Down, Depressed, Hopeless 0 0 0  PHQ - 2 Score 0 0 0  Altered sleeping - - -  Tired, decreased energy - - -  Change in appetite - - -  Feeling bad or failure about yourself  - - -  Trouble concentrating - - -  Moving slowly or fidgety/restless - - -  Suicidal thoughts - - -  PHQ-9 Score - - -  Difficult doing work/chores - - -  No flowsheet data found.  -------------------------------------------------------------------------- O: No physical exam performed due to remote telephone encounter.  Lab results reviewed.  Recent Results (from the past 2160 hour(s))  Cancer antigen 27.29     Status: None   Collection Time: 07/21/18 11:37 AM  Result Value Ref Range   CA 27.29 23.9 0.0 - 38.6 U/mL    Comment: Siemens Centaur Immunochemiluminometric Methodology (ICMA) Values obtained with different assay methods or kits cannot be used interchangeably. Results cannot be interpreted as absolute evidence of the presence or absence of malignant disease.     -------------------------------------------------------------------------- A&P:  Problem List Items Addressed This Visit    Colovaginal fistula   Relevant Medications   amoxicillin-clavulanate (AUGMENTIN) 875-125 MG tablet    Other Visit Diagnoses    Subacute vaginitis    -  Primary   Relevant Medications   amoxicillin-clavulanate (AUGMENTIN) 875-125 MG tablet   fluconazole (DIFLUCAN) 150 MG tablet     Clinically difficult to determine exact etiology remotely, concerning for possible bacterial vaginitis maybe secondary to recent colovaginal fistula, possible yeast component as well given discharge.  Plan - Emphasized  patient needs to return to [facility] to follow-up with the previous surgeon following her for similar issue, would benefit from repeat evaluation to determine a more clear diagnosis since ongoing for 1.5 months - Empiric coverage with oral antibiotic Augmentin for possible skin/soft tissue infection, and added Diflucan for possible yeast coverage - may use topical as needed - Return precautions given  Meds ordered this encounter  Medications  . amoxicillin-clavulanate (AUGMENTIN) 875-125 MG tablet    Sig: Take 1 tablet by mouth 2 (two) times daily.    Dispense:  20 tablet    Refill:  0  . fluconazole (DIFLUCAN) 150 MG tablet    Sig: Take one tablet by mouth on Day 1. Repeat dose 2nd tablet on Day 3.    Dispense:  2 tablet    Refill:  0    Follow-up: - Return 1-2 weeks as needed if not improved  Patient verbalizes understanding with the above medical recommendations including the limitation of remote medical advice.  Specific follow-up and call-back criteria were given for patient to follow-up or seek medical care more urgently if needed.   - Time spent in direct consultation with patient on phone: 11 minutes  [name], [facility] 08/11/2018, 9:00 AM

## 2018-08-22 ENCOUNTER — Telehealth: Payer: Self-pay | Admitting: Family Medicine

## 2018-08-22 DIAGNOSIS — K602 Anal fissure, unspecified: Secondary | ICD-10-CM

## 2018-08-22 MED ORDER — HYDROCORTISONE (PERIANAL) 2.5 % EX CREA: 1.0000 "application " | TOPICAL_CREAM | Freq: Two times a day (BID) | CUTANEOUS | 1 refills | Status: DC

## 2018-08-22 NOTE — Telephone Encounter (Signed)
Pt called said that her bottom was not better have taken all the medication was not sure what she need to do

## 2018-08-22 NOTE — Telephone Encounter (Signed)
Virtual visit on 08/11/18, she was rx antibiotic and anti yeast medication to cover potential infection.  I advised her as follows if it did not improve:  Emphasized patient needs to return to [facility] to follow-up with the previous surgeon following her for similar issue, would benefit from repeat evaluation to determine a more clear diagnosis since ongoing for 1.5 months  Please follow up with [facility]  Kathline Magic, MD  [place] # [address]  [place], [place] [address]  [phone]   If they prefer you can see a LOCAL Surgeon or GYN provider then let me know and I can refer.  [name], [facility] 08/22/2018, 3:57 PM

## 2018-08-22 NOTE — Telephone Encounter (Signed)
Patient would like to try hydrocortisone anal fissure cream may be 2.5 mg first if she gets worst then she will call for GYN referral.

## 2018-09-12 ENCOUNTER — Other Ambulatory Visit: Payer: Self-pay | Admitting: General Surgery

## 2018-09-20 ENCOUNTER — Other Ambulatory Visit: Payer: Self-pay | Admitting: Family Medicine

## 2018-09-20 DIAGNOSIS — F5101 Primary insomnia: Secondary | ICD-10-CM

## 2018-09-30 ENCOUNTER — Encounter: Payer: Self-pay | Admitting: General Surgery

## 2018-10-12 ENCOUNTER — Other Ambulatory Visit: Payer: Self-pay

## 2018-10-12 ENCOUNTER — Ambulatory Visit: Payer: Medicare Other | Admitting: Family Medicine

## 2018-10-12 ENCOUNTER — Encounter: Payer: Self-pay | Admitting: Family Medicine

## 2018-10-12 VITALS — BP 148/66 | HR 67 | Temp 98.3°F | Resp 16 | Ht 62.0 in | Wt 136.0 lb

## 2018-10-12 DIAGNOSIS — M0579 Rheumatoid arthritis with rheumatoid factor of multiple sites without organ or systems involvement: Secondary | ICD-10-CM | POA: Diagnosis not present

## 2018-10-12 DIAGNOSIS — M19041 Primary osteoarthritis, right hand: Secondary | ICD-10-CM

## 2018-10-12 DIAGNOSIS — M5412 Radiculopathy, cervical region: Secondary | ICD-10-CM

## 2018-10-12 DIAGNOSIS — I10 Essential (primary) hypertension: Secondary | ICD-10-CM

## 2018-10-12 DIAGNOSIS — M19042 Primary osteoarthritis, left hand: Secondary | ICD-10-CM

## 2018-10-12 MED ORDER — PREGABALIN 75 MG PO CAPS: 75.0000 mg | ORAL_CAPSULE | Freq: Two times a day (BID) | ORAL | 2 refills | Status: DC

## 2018-10-12 NOTE — Progress Notes (Signed)
Subjective:    Patient ID: Elizabeth Bailey, female    DOB: 13-Jan-1941, 78 y.o.   MRN: [id number]  Elizabeth Bailey is a 78 y.o. female presenting on 10/12/2018 for Hypertension   HPI   CHRONIC HTN: Chart review previously had issues with vasovagal syncope and BP fluctuation, followed by [facility] Dr Fletcher Anon as well. Recent changes she was taken off of HCTZ in past and increased Losartan. Then she had HTN and added back Amlodipine low dose. - Interval update since that visit now she says only taking 1 BP pill, there was confusion as to which pill, she has son to help with medicines, she is not interested in other help at the moment for medicines - She is doing better, only occasional elevated BP but not severe. She has not endorsed any further syncopal episodes or lightheaded or dizziness Current Meds - Amlodipine 5mg  vs Losartan100mg  daily - uncertain which one - Remains off HCTZ Reports good compliance, took meds today. Tolerating well, w/o complaints. Improved exercise / walking - less recently due to [place] concerns  Rheumatoid Arthritis / Osteoarthritis Followed by Dr Jefm Bryant (Rheumatology), last seen 10/11/18. Also Dr Sharlet Salina for OA/DJD cervical radiculitis and pain. Managed on Hydrocodone as needed, previously on Gabapentin but limited results, today asking about dose adjust and switch to Lyrica, she was recommended this previously. She has known OA/DJD multiple joints, including shoulder and back / spine.   Depression screen [facility] 2/9 10/12/2018 06/14/2018 05/06/2018  Decreased Interest 0 0 0  Down, Depressed, Hopeless 0 0 0  PHQ - 2 Score 0 0 0  Altered sleeping - - -  Tired, decreased energy - - -  Change in appetite - - -  Feeling bad or failure about yourself  - - -  Trouble concentrating - - -  Moving slowly or fidgety/restless - - -  Suicidal thoughts - - -  PHQ-9 Score - - -  Difficult doing work/chores - - -    Social History   Tobacco Use  . Smoking status: Former  Smoker    Packs/day: 0.50    Years: 10.00    Pack years: 5.00    Types: Cigarettes    Quit date: 1988    Years since quitting: 32.6  . Smokeless tobacco: Former Systems developer  . Tobacco comment: quit 1988  Substance Use Topics  . Alcohol use: No    Alcohol/week: 0.0 standard drinks    Comment: wine twice a month   . Drug use: No    Review of Systems Per HPI unless specifically indicated above     Objective:    BP (!) 148/66   Pulse 67   Temp 98.3 F (36.8 C) (Oral)   Resp 16   Ht 5\' 2"  (1.575 m)   Wt 136 lb (61.7 kg)   BMI 24.87 kg/m   Wt Readings from Last 3 Encounters:  10/12/18 136 lb (61.7 kg)  08/11/18 128 lb 12 oz (58.4 kg)  08/02/18 132 lb (59.9 kg)    Physical Exam Vitals signs and nursing note reviewed.  Constitutional:      General: She is not in acute distress.    Appearance: She is well-developed. She is not diaphoretic.     Comments: Well-appearing thin elderly 78 yr female, comfortable, cooperative  HENT:     Head: Normocephalic and atraumatic.  Eyes:     General:        Right eye: No discharge.  Left eye: No discharge.     Conjunctiva/sclera: Conjunctivae normal.  Neck:     Musculoskeletal: Normal range of motion and neck supple.     Thyroid: No thyromegaly.  Cardiovascular:     Rate and Rhythm: Normal rate and regular rhythm.     Heart sounds: Normal heart sounds. No murmur.  Pulmonary:     Effort: Pulmonary effort is normal. No respiratory distress.     Breath sounds: Normal breath sounds. No wheezing or rales.     Comments: Some mild difficulty breathing with mask in place. Speaks full sentences. No coughing. Musculoskeletal: Normal range of motion.  Lymphadenopathy:     Cervical: No cervical adenopathy.  Skin:    General: Skin is warm and dry.     Findings: No erythema or rash.  Neurological:     Mental Status: She is alert and oriented to person, place, and time.  Psychiatric:        Behavior: Behavior normal.     Comments: Well  groomed, good eye contact, normal speech and thoughts    Results for orders placed or performed in visit on 05/17/18  Cancer antigen 27.29  Result Value Ref Range   CA 27.29 23.9 0.0 - 38.6 U/mL      Assessment & Plan:   Problem List Items Addressed This Visit    Cervical radiculitis   Relevant Medications   pregabalin (LYRICA) 75 MG capsule   Osteoarthritis of hands, bilateral   Relevant Medications   pregabalin (LYRICA) 75 MG capsule   Rheumatoid arthritis involving multiple sites with positive rheumatoid factor (HCC) - Primary   Relevant Medications   pregabalin (LYRICA) 75 MG capsule      Clinically with extensive OA/DJD multiple joints, some RA as well with chronic pain. Joint pain and some neuropathic component with radiculitis in C-spine previously dx. - On Gabapentin but it is ineffective at moderate dose currently  Plan - Trial switch her off Gabapentin brief taper, then start Lyrica 75mg  BID for now can adjust dose in future, review side effects and benefits - Follow-up with her Orthopedic / Rheumatology Pain specialist in future to discuss further, anticipate Lyrica would be temporary rx from our office but can continue if need, if not rx by her other specialist  Meds ordered this encounter  Medications  . pregabalin (LYRICA) 75 MG capsule    Sig: Take 1 capsule (75 mg total) by mouth 2 (two) times daily.    Dispense:  60 capsule    Refill:  2    Discontinue Gabapentin     Follow up plan: Return in about 3 months (around 01/12/2019) for 3 months pain / med adjust, RA, HTN meds.   [name], [facility] 10/12/2018, 8:29 AM

## 2018-10-12 NOTE — Assessment & Plan Note (Signed)
Improved BP now, uncertain current medication status given patient confusion about which med taking Elevated Cr w/ CKD III Followed by Cardiology Dr Fletcher Anon    Plan:  1. She should contact us within 24 hours with med list - her son will be available in 2 days and he can help [place] as well - she should be taking both Losartan 100mg  and Amlodipine 5mg  daily unless this was changed since last visit, I do not see record - Follow up with [facility] as well for BP concerns Return criteria given if elevated BP

## 2018-10-12 NOTE — Patient Instructions (Addendum)
Thank you for coming to the office today.  We need to see which medicines you are taking or not taking.  Please call back with a list of medications - specifically we need to know - which BP pills you are taking  - Losartan 100mg  once daily - Amlodipine 5mg  once daily  We can REDUCE and then STOP Gabapentin and Start Lyrica - Starting today (Wednesday) - REDUCE Gabapentin - instead of 5 pills a day, go down to about 3 pills a day for next 2 days - On Saturday - STOP Gabapentin. START Lyrica (Pregabalin) 1 pill twice a day.    Please schedule a Follow-up Appointment to: Return in about 3 months (around 01/12/2019) for 3 months pain / med adjust, RA, HTN meds.  If you have any other questions or concerns, please feel free to call the office or send a message through [place]. You may also schedule an earlier appointment if necessary.  Additionally, you may be receiving a survey about your experience at our office within a few days to 1 week by e-mail or mail. We value your feedback.  [name], DO [place]

## 2018-10-16 ENCOUNTER — Other Ambulatory Visit: Payer: Self-pay | Admitting: Family Medicine

## 2018-10-16 DIAGNOSIS — I10 Essential (primary) hypertension: Secondary | ICD-10-CM

## 2018-11-03 ENCOUNTER — Other Ambulatory Visit: Payer: Self-pay | Admitting: Family Medicine

## 2018-11-03 DIAGNOSIS — K219 Gastro-esophageal reflux disease without esophagitis: Secondary | ICD-10-CM

## 2018-12-19 ENCOUNTER — Other Ambulatory Visit: Payer: Self-pay | Admitting: Family Medicine

## 2018-12-19 DIAGNOSIS — F5101 Primary insomnia: Secondary | ICD-10-CM

## 2018-12-24 ENCOUNTER — Other Ambulatory Visit: Payer: Self-pay | Admitting: Family Medicine

## 2018-12-24 DIAGNOSIS — M19041 Primary osteoarthritis, right hand: Secondary | ICD-10-CM

## 2018-12-24 DIAGNOSIS — M5412 Radiculopathy, cervical region: Secondary | ICD-10-CM

## 2018-12-24 DIAGNOSIS — M0579 Rheumatoid arthritis with rheumatoid factor of multiple sites without organ or systems involvement: Secondary | ICD-10-CM

## 2018-12-24 DIAGNOSIS — M19042 Primary osteoarthritis, left hand: Secondary | ICD-10-CM

## 2019-01-03 ENCOUNTER — Ambulatory Visit (INDEPENDENT_AMBULATORY_CARE_PROVIDER_SITE_OTHER): Payer: Medicare Other

## 2019-01-03 ENCOUNTER — Other Ambulatory Visit: Payer: Self-pay

## 2019-01-03 VITALS — BP 140/74 | Ht 62.0 in | Wt 150.0 lb

## 2019-01-03 DIAGNOSIS — Z0000 Encounter for general adult medical examination without abnormal findings: Secondary | ICD-10-CM | POA: Diagnosis not present

## 2019-01-03 NOTE — Patient Instructions (Signed)
Ms. Elizabeth Bailey , Thank you for taking time to come for your Medicare Wellness Visit. I appreciate your ongoing commitment to your health goals. Please review the following plan we discussed and let me know if I can assist you in the future.   Screening recommendations/referrals: Colonoscopy: no longer required Mammogram: up to date Bone Density: up to date  Recommended yearly ophthalmology/optometry visit for glaucoma screening and checkup Recommended yearly dental visit for hygiene and checkup  Vaccinations: Influenza vaccine: up to date Pneumococcal vaccine: up to date Tdap vaccine: up to date Shingles vaccine: shingrix eligible     Advanced directives: Advance directive discussed with you today. I have provided a copy for you to complete at home and have notarized. Once this is complete please bring a copy in to our office so we can scan it into your chart.  Conditions/risks identified: none  Next appointment: follow up in one year for your annual wellness visit    Preventive Care 65 Years and Older, Female Preventive care refers to lifestyle choices and visits with your health care provider that can promote health and wellness. What does preventive care include?  A yearly physical exam. This is also called an annual well check.  Dental exams once or twice a year.  Routine eye exams. Ask your health care provider how often you should have your eyes checked.  Personal lifestyle choices, including:  Daily care of your teeth and gums.  Regular physical activity.  Eating a healthy diet.  Avoiding tobacco and drug use.  Limiting alcohol use.  Practicing safe sex.  Taking low-dose aspirin every day.  Taking vitamin and mineral supplements as recommended by your health care provider. What happens during an annual well check? The services and screenings done by your health care provider during your annual well check will depend on your age, overall health, lifestyle risk  factors, and family history of disease. Counseling  Your health care provider may ask you questions about your:  Alcohol use.  Tobacco use.  Drug use.  Emotional well-being.  Home and relationship well-being.  Sexual activity.  Eating habits.  History of falls.  Memory and ability to understand (cognition).  Work and work Statistician.  Reproductive health. Screening  You may have the following tests or measurements:  Height, weight, and BMI.  Blood pressure.  Lipid and cholesterol levels. These may be checked every 5 years, or more frequently if you are over 81 years old.  Skin check.  Lung cancer screening. You may have this screening every year starting at age 44 if you have a 30-pack-year history of smoking and currently smoke or have quit within the past 15 years.  Fecal occult blood test (FOBT) of the stool. You may have this test every year starting at age 55.  Flexible sigmoidoscopy or colonoscopy. You may have a sigmoidoscopy every 5 years or a colonoscopy every 10 years starting at age 40.  Hepatitis C blood test.  Hepatitis B blood test.  Sexually transmitted disease (STD) testing.  Diabetes screening. This is done by checking your blood sugar (glucose) after you have not eaten for a while (fasting). You may have this done every 1-3 years.  Bone density scan. This is done to screen for osteoporosis. You may have this done starting at age 57.  Mammogram. This may be done every 1-2 years. Talk to your health care provider about how often you should have regular mammograms. Talk with your health care provider about your test results, treatment  options, and if necessary, the need for more tests. Vaccines  Your health care provider may recommend certain vaccines, such as:  Influenza vaccine. This is recommended every year.  Tetanus, diphtheria, and acellular pertussis (Tdap, Td) vaccine. You may need a Td booster every 10 years.  Zoster vaccine. You  may need this after age 12.  Pneumococcal 13-valent conjugate (PCV13) vaccine. One dose is recommended after age 70.  Pneumococcal polysaccharide (PPSV23) vaccine. One dose is recommended after age 43. Talk to your health care provider about which screenings and vaccines you need and how often you need them. This information is not intended to replace advice given to you by your health care provider. Make sure you discuss any questions you have with your health care provider. Document Released: 03/15/2015 Document Revised: 11/06/2015 Document Reviewed: 12/18/2014 Elsevier Interactive Patient Education  2017 [place] Prevention in the Home Falls can cause injuries. They can happen to people of all ages. There are many things you can do to make your home safe and to help prevent falls. What can I do on the outside of my home?  Regularly fix the edges of walkways and driveways and fix any cracks.  Remove anything that might make you trip as you walk through a door, such as a raised step or threshold.  Trim any bushes or trees on the path to your home.  Use bright outdoor lighting.  Clear any walking paths of anything that might make someone trip, such as rocks or tools.  Regularly check to see if handrails are loose or broken. Make sure that both sides of any steps have handrails.  Any raised decks and porches should have guardrails on the edges.  Have any leaves, snow, or ice cleared regularly.  Use sand or salt on walking paths during winter.  Clean up any spills in your garage right away. This includes oil or grease spills. What can I do in the bathroom?  Use night lights.  Install grab bars by the toilet and in the tub and shower. Do not use towel bars as grab bars.  Use non-skid mats or decals in the tub or shower.  If you need to sit down in the shower, use a plastic, non-slip stool.  Keep the floor dry. Clean up any water that spills on the floor as soon as  it happens.  Remove soap buildup in the tub or shower regularly.  Attach bath mats securely with double-sided non-slip rug tape.  Do not have throw rugs and other things on the floor that can make you trip. What can I do in the bedroom?  Use night lights.  Make sure that you have a light by your bed that is easy to reach.  Do not use any sheets or blankets that are too big for your bed. They should not hang down onto the floor.  Have a firm chair that has side arms. You can use this for support while you get dressed.  Do not have throw rugs and other things on the floor that can make you trip. What can I do in the kitchen?  Clean up any spills right away.  Avoid walking on wet floors.  Keep items that you use a lot in easy-to-reach places.  If you need to reach something above you, use a strong step stool that has a grab bar.  Keep electrical cords out of the way.  Do not use floor polish or wax that makes floors slippery.  If you must use wax, use non-skid floor wax.  Do not have throw rugs and other things on the floor that can make you trip. What can I do with my stairs?  Do not leave any items on the stairs.  Make sure that there are handrails on both sides of the stairs and use them. Fix handrails that are broken or loose. Make sure that handrails are as long as the stairways.  Check any carpeting to make sure that it is firmly attached to the stairs. Fix any carpet that is loose or worn.  Avoid having throw rugs at the top or bottom of the stairs. If you do have throw rugs, attach them to the floor with carpet tape.  Make sure that you have a light switch at the top of the stairs and the bottom of the stairs. If you do not have them, ask someone to add them for you. What else can I do to help prevent falls?  Wear shoes that:  Do not have high heels.  Have rubber bottoms.  Are comfortable and fit you well.  Are closed at the toe. Do not wear sandals.  If  you use a stepladder:  Make sure that it is fully opened. Do not climb a closed stepladder.  Make sure that both sides of the stepladder are locked into place.  Ask someone to hold it for you, if possible.  Clearly mark and make sure that you can see:  Any grab bars or handrails.  First and last steps.  Where the edge of each step is.  Use tools that help you move around (mobility aids) if they are needed. These include:  Canes.  Walkers.  Scooters.  Crutches.  Turn on the lights when you go into a dark area. Replace any light bulbs as soon as they burn out.  Set up your furniture so you have a clear path. Avoid moving your furniture around.  If any of your floors are uneven, fix them.  If there are any pets around you, be aware of where they are.  Review your medicines with your doctor. Some medicines can make you feel dizzy. This can increase your chance of falling. Ask your doctor what other things that you can do to help prevent falls. This information is not intended to replace advice given to you by your health care provider. Make sure you discuss any questions you have with your health care provider. Document Released: 12/13/2008 Document Revised: 07/25/2015 Document Reviewed: 03/23/2014 Elsevier Interactive Patient Education  2017 [employer].

## 2019-01-03 NOTE — Progress Notes (Signed)
Subjective:   Elizabeth Bailey is a 78 y.o. female who presents for Medicare Annual (Subsequent) preventive examination.  This visit is being conducted via phone call  - after an attmept to do on video chat - due to the COVID-19 pandemic. This patient has given me verbal consent via phone to conduct this visit, patient states they are participating from their home address. Some vital signs may be absent or patient reported.   Patient identification: identified by name, DOB, and current address.    Review of Systems:         Objective:     Vitals: BP 140/74    Ht 5\' 2"  (1.575 m)    Wt 150 lb (68 kg)    BMI 27.44 kg/m   Body mass index is 27.44 kg/m.  Advanced Directives 08/11/2018 01/16/2018 01/05/2018 08/12/2017 08/06/2016 05/05/2016 04/06/2016  Does Patient Have a Medical Advance Directive? No Yes Yes No No No;Yes Yes  Type of Advance Directive - - [place];Living will - - [place];Living will [place];Living will  Does patient want to make changes to medical advance directive? - - No - Patient declined - - - -  Copy of [place] in Chart? - - No - copy requested - - Yes No - copy requested  Would patient like information on creating a medical advance directive? No - Patient declined - - No - Patient declined No - Patient declined No - Patient declined -    Tobacco Social History   Tobacco Use  Smoking Status Former Smoker   Packs/day: 0.50   Years: 10.00   Pack years: 5.00   Types: Cigarettes   Quit date: 32   Years since quitting: 32.8  Smokeless Tobacco Never Used  Tobacco Comment   quit 1988     Counseling given: Not Answered Comment: quit 1988   Clinical Intake:  Pre-visit preparation completed: Yes  Pain : No/denies pain     Nutritional Status: BMI 25 -29 Overweight Nutritional Risks: None Diabetes: No     Interpreter Needed?: No  Information entered by :: Alexzandria Massman,LPN  Past Medical History:  Diagnosis Date   Arthritis    Asthma    Breast cancer ([place]) 03/27/2016   Invasive mammary carcinoma right breast pT1c  pN0 (12 mm) MAMMOPRINT: LOW RISK; ER/PR positive  DCIS medial margin 1 mm   Chronic bronchitis (HCC)    Collagen vascular disease (HCC)    Depression    Diverticulosis    GERD (gastroesophageal reflux disease)    High cholesterol    Hypertension    Personal history of radiation therapy    Seasonal allergies    Past Surgical History:  Procedure Laterality Date   ABDOMINAL HYSTERECTOMY     BREAST BIOPSY  1970's   BREAST BIOPSY Right 03/27/2016   INVASIVE MAMMARY CARCINOMA   BREAST LUMPECTOMY Right 04/09/2016   radiation   BREAST LUMPECTOMY WITH SENTINEL LYMPH NODE BIOPSY Right 04/09/2016   Procedure: BREAST LUMPECTOMY WITH SENTINEL LYMPH NODE BX;  Surgeon: Christene Lye, MD;  Location: [facility] ORS;  Service: General;  Laterality: Right;   CAROTID ENDARTERECTOMY Left    CHOLECYSTECTOMY     COLONOSCOPY  2005   DG  BONE DENSITY ([place] [facility])  2010   DIAGNOSTIC MAMMOGRAM  2015   ESOPHAGOGASTRODUODENOSCOPY (EGD) WITH PROPOFOL N/A 05/05/2016   Procedure: ESOPHAGOGASTRODUODENOSCOPY (EGD) WITH PROPOFOL;  Surgeon: Lucilla Lame, MD;  Location: [facility] ENDOSCOPY;  Service: Endoscopy;  Laterality: N/A;   EUS N/A 03/12/2016   Procedure: FULL UPPER ENDOSCOPIC ULTRASOUND (EUS) RADIAL;  Surgeon: Holly Bodily, MD;  Location: [facility] ENDOSCOPY;  Service: Endoscopy;  Laterality: N/A;   GALLBLADDER SURGERY     PANCREAS SURGERY  01/24/2016   Dr Burt Knack   pap smear  2014   REPAIR OF PERFORATED ULCER N/A 01/24/2016   Procedure: Exploratory Laparotomy with biopsy of Pancreas;  Surgeon: Florene Glen, MD;  Location: [facility] ORS;  Service: General;  Laterality: N/A;   UPPER GI ENDOSCOPY  03/12/2016   Dr Mont Dutton   Family History  Problem Relation Age of Onset   Pneumonia Mother    Depression Mother    Depression  Sister    Breast cancer Sister 21   Diabetes Sister    Stroke Brother    Heart disease Brother    Social History   Socioeconomic History   Marital status: Widowed    Spouse name: Not on file   Number of children: Not on file   Years of education: Not on file   Highest education level: Not on file  Occupational History   Occupation: retired  Scientist, product/process development strain: Not hard at all   Food insecurity    Worry: Never true    Inability: Never true   Transportation needs    Medical: No    Non-medical: No  Tobacco Use   Smoking status: Former Smoker    Packs/day: 0.50    Years: 10.00    Pack years: 5.00    Types: Cigarettes    Quit date: 1988    Years since quitting: 32.8   Smokeless tobacco: Never Used   Tobacco comment: quit 1988  Substance and Sexual Activity   Alcohol use: No    Alcohol/week: 0.0 standard drinks    Comment: wine twice a month    Drug use: No   Sexual activity: Never  Lifestyle   Physical activity    Days per week: 3 days    Minutes per session: 20 min   Stress: Not at all  Relationships   Social connections    Talks on phone: More than three times a week    Gets together: More than three times a week    Attends religious service: More than 4 times per year    Active member of club or organization: No    Attends meetings of clubs or organizations: Never    Relationship status: Widowed  Other Topics Concern   Not on file  Social History Narrative   Not on file    Outpatient Encounter Medications as of 01/03/2019  Medication Sig   acetaminophen (TYLENOL) 325 MG tablet Take 650 mg by mouth every 6 (six) hours as needed for mild pain.   Adalimumab (HUMIRA PEN) 40 MG/0.8ML PNKT Inject 40 mg into the skin every 21 ( twenty-one) days. TAKES EVERY OTHER WEEK  LAST DOSE WAS IN 01-2016   albuterol (PROVENTIL HFA;VENTOLIN HFA) 108 (90 Base) MCG/ACT inhaler Inhale 2 puffs into the lungs every 6 (six) hours as  needed for wheezing or shortness of breath.   ALBUTEROL IN Inhale 2 Inhalers into the lungs every 6 (six) hours as needed.   amLODipine (NORVASC) 5 MG tablet TAKE 1 TABLET BY MOUTH ONCE DAILY   bacitracin ointment Apply to affected (left wrist) area daily   budesonide-formoterol (SYMBICORT) 160-4.5 MCG/ACT inhaler Inhale 1 puff into the lungs 2 (two) times daily.   calcium-vitamin  D (OSCAL WITH D) 500-200 MG-UNIT TABS tablet Take by mouth.   diclofenac sodium (VOLTAREN) 1 % GEL Apply 3 grams topically qid prn   feeding supplement (BOOST / RESOURCE BREEZE) LIQD Take 1 Container by mouth 3 (three) times daily between meals.   fluticasone (FLONASE) 50 MCG/ACT nasal spray Place 2 sprays into both nostrils daily. Use for 4-6 weeks then stop and use seasonally or as needed.   HYDROcodone-acetaminophen (NORCO/VICODIN) 5-325 MG tablet Take 1 tablet by mouth every 6 (six) hours as needed.   letrozole (FEMARA) 2.5 MG tablet TAKE 1 TABLET BY MOUTH ONCE DAILY   Melatonin 10 MG TABS Take 2 tablets by mouth at bedtime. Reported on 06/03/2015   montelukast (SINGULAIR) 10 MG tablet TAKE 1 TABLET BY MOUTH ONCE DAILY   Omega 3 1000 MG CAPS Take 1,000 mg by mouth daily. omega-3 fatty acids-vitamin E (FISH OIL) 1,000 mg   omeprazole (PRILOSEC) 40 MG capsule TAKE 1 CAPSULE BY MOUTH ONCE DAILY BEFORE BREAKFAST   pregabalin (LYRICA) 75 MG capsule TAKE 1 CAPSULE BY MOUTH TWICE DAILY   simvastatin (ZOCOR) 20 MG tablet Take 1 tablet (20 mg total) by mouth every other day. Take at bedtime   zolpidem (AMBIEN) 5 MG tablet TAKE 1 TABLET BY MOUTH AT BEDTIME AS NEEDED FOR SLEEP   [DISCONTINUED] diphenhydramine-acetaminophen (TYLENOL PM) 25-500 MG TABS tablet Take 1-2 tablets by mouth at bedtime as needed (SLEEP). 1-2 TABLETS DEPENDING ON DIFFICULTLY SLEEPING   [DISCONTINUED] folic acid (FOLVITE) 1 MG tablet Take by mouth.   [DISCONTINUED] hydrocortisone (ANUSOL-HC) 2.5 % rectal cream Place 1 application  rectally 2 (two) times daily. For 7-10 days (Patient not taking: Reported on 10/12/2018)   [DISCONTINUED] losartan (COZAAR) 100 MG tablet Take 1 tablet (100 mg total) by mouth daily. (Patient not taking: Reported on 10/12/2018)   [DISCONTINUED] Multiple Vitamin (MULTI-VITAMINS) TABS Take by mouth.   [DISCONTINUED] ZINC OXIDE, TOPICAL, 10 % CREA Apply 1 application topically every 3 (three) hours as needed (perineal irritation). (Patient not taking: Reported on 01/03/2019)   No facility-administered encounter medications on file as of 01/03/2019.     Activities of Daily Living In your present state of health, do you have any difficulty performing the following activities: 01/03/2019 01/05/2018  Hearing? Y N  Comment hearing aids -  Vision? N N  Comment reading glasses, goes to eye dr -  Difficulty concentrating or making decisions? N N  Walking or climbing stairs? N N  Dressing or bathing? N N  Doing errands, shopping? N N  Preparing Food and eating ? N N  Using the Toilet? N N  In the past six months, have you accidently leaked urine? Y N  Comment occasionally -  Do you have problems with loss of bowel control? N N  Managing your Medications? N N  Managing your Finances? N N  Housekeeping or managing your Housekeeping? N N  Some recent data might be hidden    Patient Care Team: Olin Hauser, DO as PCP - General (Family Medicine) Luan Pulling Ronelle Nigh., MD (Inactive) (Family Medicine) Christene Lye, MD (General Surgery)    Assessment:   This is a routine wellness examination for [place].  Exercise Activities and Dietary recommendations Current Exercise Habits: Home exercise routine, Type of exercise: walking, Time (Minutes): 20, Frequency (Times/Week): 3, Weekly Exercise (Minutes/Week): 60, Intensity: Mild, Exercise limited by: None identified  Goals   None     Fall Risk: Fall Risk  01/03/2019 10/12/2018 06/14/2018 05/06/2018 04/13/2018  Falls in the past year?  0 0 0 0 0  Number falls in past yr: 0 - - - -  Injury with Fall? 0 - - - -  Follow up - Falls evaluation completed Falls evaluation completed Falls evaluation completed Falls evaluation completed    [place]:  Any stairs in or around the home? Yes  If so, are there any without handrails? No   Home free of loose throw rugs in walkways, pet beds, electrical cords, etc? Yes  Adequate lighting in your home to reduce risk of falls? Yes   ASSISTIVE DEVICES UTILIZED TO PREVENT FALLS:  Life alert? No  Use of a cane, walker or w/c? No  Grab bars in the bathroom? Yes  Shower chair or bench in shower? No  Elevated toilet seat or a handicapped toilet? No   DME ORDERS:  DME order needed?  No   TIMED UP AND GO:  Unable to perform   Depression Screen PHQ 2/9 Scores 01/03/2019 10/12/2018 06/14/2018 05/06/2018  PHQ - 2 Score 0 0 0 0  PHQ- 9 Score - - - -     Cognitive Function     6CIT Screen 01/05/2018  What Year? 0 points  What month? 0 points  What time? 0 points  Count back from 20 0 points  Months in reverse 0 points  Repeat phrase 4 points  Total Score 4    Immunization History  Administered Date(s) Administered   Fluad Quad(high Dose 65+) 12/05/2018   Influenza, High Dose Seasonal PF 11/28/2014, 12/05/2015, 12/08/2017   Influenza-Unspecified 12/04/2016   Pneumococcal Conjugate-13 01/09/2015   Pneumococcal-Unspecified 11/28/2011   Tdap 01/05/2018    Qualifies for Shingles Vaccine? Yes  Zostavax completed n/a. Due for Shingrix. Education has been provided regarding the importance of this vaccine. Pt has been advised to call insurance company to determine out of pocket expense. Advised may also receive vaccine at local pharmacy or Health Dept. Verbalized acceptance and understanding.  Tdap: up to date  Flu Vaccine: up to date  Pneumococcal Vaccine: up to date  Screening Tests Health Maintenance  Topic Date Due    TETANUS/TDAP  01/06/2028   INFLUENZA VACCINE  Completed   DEXA SCAN  Completed   PNA vac Low Risk Adult  Completed    Cancer Screenings:  Colorectal Screening: no longer required  Mammogram: Completed 07/27/2018  Bone Density: Completed   Lung Cancer Screening: (Low Dose CT Chest recommended if Age 60-80 years, 30 pack-year currently smoking OR have quit w/in 15years.) does not qualify.    Additional Screening:  Hepatitis C Screening: does not qualilfy  Vision Screening: Recommended annual ophthalmology exams for early detection of glaucoma and other disorders of the eye. Is the patient up to date with their annual eye exam?  Yes    Dental Screening: Recommended annual dental exams for proper oral hygiene  Community Resource Referral:  CRR required this visit?  No       Plan:  I have personally reviewed and addressed the Medicare Annual Wellness questionnaire and have noted the following in the patients chart:  A. Medical and social history B. Use of alcohol, tobacco or illicit drugs  C. Current medications and supplements D. Functional ability and status E.  Nutritional status F.  Physical activity G. Advance directives H. List of other physicians I.  Hospitalizations, surgeries, and ER visits in previous 12 months J.  [place] such as hearing and vision if needed,  cognitive and depression L. Referrals and appointments   In addition, I have reviewed and discussed with patient certain preventive protocols, quality metrics, and best practice recommendations. A written personalized care plan for preventive services as well as general preventive health recommendations were provided to patient.  Signed,    Bevelyn Ngo, LPN  [id number] Nurse Health Advisor   Nurse Notes:none

## 2019-01-21 ENCOUNTER — Other Ambulatory Visit: Payer: Self-pay | Admitting: Family Medicine

## 2019-01-21 DIAGNOSIS — K219 Gastro-esophageal reflux disease without esophagitis: Secondary | ICD-10-CM

## 2019-01-30 ENCOUNTER — Other Ambulatory Visit: Payer: Self-pay | Admitting: Family Medicine

## 2019-01-30 NOTE — Telephone Encounter (Signed)
Pt  Called requesting  Refill on  Lyrica

## 2019-02-17 ENCOUNTER — Other Ambulatory Visit: Payer: Self-pay | Admitting: Family Medicine

## 2019-02-17 DIAGNOSIS — F5101 Primary insomnia: Secondary | ICD-10-CM

## 2019-02-17 NOTE — Telephone Encounter (Signed)
Controlled substance. Will leave for Dr. Raliegh Ip

## 2019-03-19 ENCOUNTER — Other Ambulatory Visit: Payer: Self-pay | Admitting: Family Medicine

## 2019-03-19 DIAGNOSIS — J302 Other seasonal allergic rhinitis: Secondary | ICD-10-CM

## 2019-04-17 DIAGNOSIS — M5416 Radiculopathy, lumbar region: Secondary | ICD-10-CM | POA: Diagnosis not present

## 2019-04-17 DIAGNOSIS — M5030 Other cervical disc degeneration, unspecified cervical region: Secondary | ICD-10-CM | POA: Diagnosis not present

## 2019-04-17 DIAGNOSIS — M5412 Radiculopathy, cervical region: Secondary | ICD-10-CM | POA: Diagnosis not present

## 2019-04-17 DIAGNOSIS — M5136 Other intervertebral disc degeneration, lumbar region: Secondary | ICD-10-CM | POA: Diagnosis not present

## 2019-04-18 ENCOUNTER — Ambulatory Visit (INDEPENDENT_AMBULATORY_CARE_PROVIDER_SITE_OTHER): Payer: Medicare PPO | Admitting: Family Medicine

## 2019-04-18 ENCOUNTER — Other Ambulatory Visit: Payer: Self-pay

## 2019-04-18 ENCOUNTER — Encounter: Payer: Self-pay | Admitting: Family Medicine

## 2019-04-18 DIAGNOSIS — B373 Candidiasis of vulva and vagina: Secondary | ICD-10-CM | POA: Diagnosis not present

## 2019-04-18 DIAGNOSIS — B3731 Acute candidiasis of vulva and vagina: Secondary | ICD-10-CM

## 2019-04-18 MED ORDER — FLUCONAZOLE 150 MG PO TABS: ORAL_TABLET | ORAL | 1 refills | Status: DC

## 2019-04-18 NOTE — Progress Notes (Signed)
Virtual Visit via Telephone The purpose of this virtual visit is to provide medical care while limiting exposure to the novel coronavirus (COVID19) for both patient and office staff.  Consent was obtained for phone visit:  Yes.   Answered questions that patient had about telehealth interaction:  Yes.   I discussed the limitations, risks, security and privacy concerns of performing an evaluation and management service by telephone. I also discussed with the patient that there may be a patient responsible charge related to this service. The patient expressed understanding and agreed to proceed.  Patient Location: Home Provider Location: [facility])  ---------------------------------------------------------------------- Chief Complaint  Patient presents with  . Vaginitis    discharge x4-5 days    S: Reviewed CMA documentation. I have called patient and gathered additional HPI as follows:  Vaginitis, presumed yeast infection Reports that symptoms started 4-5 days ago with typical thicker vaginal discharge, itching and irritation. She has had prior yeast infection similar in past. Last treated by [place] in 08/2018, usually gets yeast infection after antibiotics or can occur spontaneously. - Prior known history of colovaginal fistula that was treated, but says not having those symptoms now. Also has history of diverticulitis but again not having abdominal pain or other flare up now  Denies any high risk travel to areas of current concern for COVID19. Denies any known or suspected exposure to person with or possibly with COVID19.  Denies any fevers, chills, sweats, body ache, cough, shortness of breath, sinus pain or pressure, headache, abdominal pain, diarrhea  Past Medical History:  Diagnosis Date  . Arthritis   . Asthma   . Breast cancer ([place]) 03/27/2016   Invasive mammary carcinoma right breast pT1c  pN0 (12 mm) MAMMOPRINT: LOW RISK; ER/PR positive  DCIS medial  margin 1 mm  . Chronic bronchitis ([place])   . Collagen vascular disease ([place])   . Depression   . Diverticulosis   . GERD (gastroesophageal reflux disease)   . High cholesterol   . Hypertension   . Personal history of radiation therapy   . Seasonal allergies    Social History   Tobacco Use  . Smoking status: Former Smoker    Packs/day: 0.50    Years: 10.00    Pack years: 5.00    Types: Cigarettes    Quit date: 1988    Years since quitting: 33.1  . Smokeless tobacco: Never Used  . Tobacco comment: quit 1988  Substance Use Topics  . Alcohol use: Yes    Alcohol/week: 0.0 standard drinks    Comment: wine twice a month   . Drug use: No    Current Outpatient Medications:  .  acetaminophen (TYLENOL) 325 MG tablet, Take 650 mg by mouth every 6 (six) hours as needed for mild pain., Disp: , Rfl:  .  Adalimumab (HUMIRA PEN) 40 MG/0.8ML PNKT, Inject 40 mg into the skin every 21 ( twenty-one) days. TAKES EVERY OTHER WEEK  LAST DOSE WAS IN 01-2016, Disp: , Rfl:  .  albuterol (PROVENTIL HFA;VENTOLIN HFA) 108 (90 Base) MCG/ACT inhaler, Inhale 2 puffs into the lungs every 6 (six) hours as needed for wheezing or shortness of breath., Disp: , Rfl:  .  ALBUTEROL IN, Inhale 2 Inhalers into the lungs every 6 (six) hours as needed., Disp: , Rfl:  .  amLODipine (NORVASC) 5 MG tablet, TAKE 1 TABLET BY MOUTH ONCE DAILY, Disp: 90 tablet, Rfl: 1 .  budesonide-formoterol (SYMBICORT) 160-4.5 MCG/ACT inhaler, Inhale 1 puff into the lungs  2 (two) times daily., Disp: 1 Inhaler, Rfl: 12 .  calcium-vitamin D (OSCAL WITH D) 500-200 MG-UNIT TABS tablet, Take by mouth., Disp: , Rfl:  .  diclofenac sodium (VOLTAREN) 1 % GEL, Apply 3 grams topically qid prn, Disp: , Rfl:  .  feeding supplement (BOOST / RESOURCE BREEZE) LIQD, Take 1 Container by mouth 3 (three) times daily between meals., Disp: , Rfl: 0 .  fluticasone (FLONASE) 50 MCG/ACT nasal spray, Place 2 sprays into both nostrils daily. Use for 4-6 weeks then stop  and use seasonally or as needed., Disp: 16 g, Rfl: 3 .  HYDROcodone-acetaminophen (NORCO/VICODIN) 5-325 MG tablet, Take 1 tablet by mouth every 6 (six) hours as needed., Disp: , Rfl:  .  letrozole (FEMARA) 2.5 MG tablet, TAKE 1 TABLET BY MOUTH ONCE DAILY, Disp: 90 tablet, Rfl: 3 .  Melatonin 10 MG TABS, Take 2 tablets by mouth at bedtime. Reported on 06/03/2015, Disp: , Rfl:  .  montelukast (SINGULAIR) 10 MG tablet, TAKE 1 TABLET BY MOUTH ONCE DAILY, Disp: 30 tablet, Rfl: 5 .  Omega 3 1000 MG CAPS, Take 1,000 mg by mouth daily. omega-3 fatty acids-vitamin E (FISH OIL) 1,000 mg, Disp: , Rfl:  .  omeprazole (PRILOSEC) 40 MG capsule, TAKE 1 CAPSULE BY MOUTH ONCE DAILY BEFORE BREAKFAST, Disp: 90 capsule, Rfl: 1 .  pregabalin (LYRICA) 75 MG capsule, TAKE 1 CAPSULE BY MOUTH TWICE DAILY, Disp: 60 capsule, Rfl: 2 .  simvastatin (ZOCOR) 20 MG tablet, Take 1 tablet (20 mg total) by mouth every other day. Take at bedtime, Disp: 45 tablet, Rfl: 3 .  zolpidem (AMBIEN) 5 MG tablet, TAKE 1 TABLET BY MOUTH AT BEDTIME AS NEEDED FOR SLEEP, Disp: 30 tablet, Rfl: 2 .  fluconazole (DIFLUCAN) 150 MG tablet, Take one tablet by mouth on Day 1. Repeat dose 2nd tablet on Day 3., Disp: 2 tablet, Rfl: 1  Depression screen [facility] 2/9 04/18/2019 01/03/2019 10/12/2018  Decreased Interest 0 0 0  Down, Depressed, Hopeless 0 0 0  PHQ - 2 Score 0 0 0  Altered sleeping - - -  Tired, decreased energy - - -  Change in appetite - - -  Feeling bad or failure about yourself  - - -  Trouble concentrating - - -  Moving slowly or fidgety/restless - - -  Suicidal thoughts - - -  PHQ-9 Score - - -  Difficult doing work/chores - - -    No flowsheet data found.  -------------------------------------------------------------------------- O: No physical exam performed due to remote telephone encounter.  Lab results reviewed.  No results found for this or any previous visit (from the past 2160  hour(s)).  -------------------------------------------------------------------------- A&P:  Problem List Items Addressed This Visit    None    Visit Diagnoses    Yeast vaginitis    -  Primary   Relevant Medications   fluconazole (DIFLUCAN) 150 MG tablet     Clinically most consistent with vaginal yeast infection w/ vaginitis based on reported symptoms History of colovaginal fistula s/p repair and also diverticulitis but current episode not consistent with these Not consistent with UTI based on history  Will empirically treat with Fluconazole 150mg  day 1 skip day 2 and take 2nd dose on day 3, add refill if need due to recurrence Follow-up if not improving, consider pelvic / swab or refer to GYN if recurrence.  Meds ordered this encounter  Medications  . fluconazole (DIFLUCAN) 150 MG tablet    Sig: Take one tablet by mouth on  Day 1. Repeat dose 2nd tablet on Day 3.    Dispense:  2 tablet    Refill:  1    Follow-up: - Return within 1 week as needed if not resolved vaginitis  Patient verbalizes understanding with the above medical recommendations including the limitation of remote medical advice.  Specific follow-up and call-back criteria were given for patient to follow-up or seek medical care more urgently if needed.   - Time spent in direct consultation with patient on phone: 6 minutes   [name], [facility] 04/18/2019, 11:12 AM

## 2019-04-18 NOTE — Patient Instructions (Addendum)
Most likely vaginal yeast infection Start Fluconazole 150mg  pill, take 1, then skip 1 day then next day take 2nd pill added refill if returns.  Follow-up as needed if not improving we can refer you to GYN as well if keeps coming back   Please schedule a Follow-up Appointment to: Return in about 1 week (around 04/25/2019), or if symptoms worsen or fail to improve, for yeast infection.  If you have any other questions or concerns, please feel free to call the office or send a message through [facility]. You may also schedule an earlier appointment if necessary.  Additionally, you may be receiving a survey about your experience at our office within a few days to 1 week by e-mail or mail. We value your feedback.  Elizabeth Putnam, DO [place]

## 2019-04-22 ENCOUNTER — Ambulatory Visit: Payer: Medicare PPO | Attending: Internal Medicine

## 2019-04-22 DIAGNOSIS — Z23 Encounter for immunization: Secondary | ICD-10-CM

## 2019-04-22 NOTE — Progress Notes (Signed)
   Covid-19 Vaccination Clinic  Name:  Elizabeth Bailey    MRN: [id number] DOB: 1941/01/07  04/22/2019  Elizabeth Bailey was observed post Covid-19 immunization for 15 minutes without incidence. She was provided with Vaccine Information Sheet and instruction to access the V-Safe system.   Elizabeth Bailey was instructed to call 911 with any severe reactions post vaccine: Marland Kitchen Difficulty breathing  . Swelling of your face and throat  . A fast heartbeat  . A bad rash all over your body  . Dizziness and weakness    Immunizations Administered    Name Date Dose VIS Date Route   Pfizer COVID-19 Vaccine 04/22/2019 11:24 AM 0.3 mL 02/10/2019 Intramuscular   Manufacturer: [place]   Lot: [id number]   [place]: [id number]

## 2019-04-25 ENCOUNTER — Other Ambulatory Visit: Payer: Self-pay | Admitting: Family Medicine

## 2019-04-25 DIAGNOSIS — B373 Candidiasis of vulva and vagina: Secondary | ICD-10-CM

## 2019-04-25 DIAGNOSIS — B3731 Acute candidiasis of vulva and vagina: Secondary | ICD-10-CM

## 2019-05-16 ENCOUNTER — Ambulatory Visit: Payer: Medicare PPO | Attending: Internal Medicine

## 2019-05-16 DIAGNOSIS — Z23 Encounter for immunization: Secondary | ICD-10-CM

## 2019-05-16 NOTE — Progress Notes (Signed)
   Covid-19 Vaccination Clinic  Name:  Elizabeth Bailey    MRN: [id number] DOB: [birth year]  05/16/2019  Elizabeth Bailey was observed post Covid-19 immunization for 15 minutes without incident. She was provided with Vaccine Information Sheet and instruction to access the V-Safe system.   Elizabeth Bailey was instructed to call 911 with any severe reactions post vaccine: Marland Kitchen Difficulty breathing  . Swelling of face and throat  . A fast heartbeat  . A bad rash all over body  . Dizziness and weakness   Immunizations Administered    Name Date Dose VIS Date Route   Pfizer COVID-19 Vaccine 05/16/2019 11:22 AM 0.3 mL 02/10/2019 Intramuscular   Manufacturer: [place]   Lot: [id number]   [place]: [id number]

## 2019-05-19 ENCOUNTER — Other Ambulatory Visit: Payer: Self-pay | Admitting: Family Medicine

## 2019-05-19 DIAGNOSIS — M19042 Primary osteoarthritis, left hand: Secondary | ICD-10-CM

## 2019-05-19 DIAGNOSIS — M0579 Rheumatoid arthritis with rheumatoid factor of multiple sites without organ or systems involvement: Secondary | ICD-10-CM

## 2019-05-19 DIAGNOSIS — M5412 Radiculopathy, cervical region: Secondary | ICD-10-CM

## 2019-05-19 DIAGNOSIS — M19041 Primary osteoarthritis, right hand: Secondary | ICD-10-CM

## 2019-05-19 NOTE — Telephone Encounter (Signed)
Pt is requesting Lyrica  75 MG called into [place]

## 2019-06-01 ENCOUNTER — Other Ambulatory Visit: Payer: Self-pay | Admitting: Family Medicine

## 2019-06-01 DIAGNOSIS — I10 Essential (primary) hypertension: Secondary | ICD-10-CM

## 2019-06-18 ENCOUNTER — Other Ambulatory Visit: Payer: Self-pay | Admitting: Family Medicine

## 2019-06-18 DIAGNOSIS — F5101 Primary insomnia: Secondary | ICD-10-CM

## 2019-06-18 NOTE — Telephone Encounter (Signed)
Requested medication (s) are due for refill today: yes  Requested medication (s) are on the active medication list: yes  Last refill:  02/20/19  Future visit scheduled: no  Notes to clinic:  medication not delegated to NT to refill   Requested Prescriptions  Pending Prescriptions Disp Refills   zolpidem (AMBIEN) 5 MG tablet [Pharmacy Med Name: ZOLPIDEM TARTRATE 5 MG TAB] 30 tablet     Sig: TAKE 1 TABLET BY MOUTH AT BEDTIME AS NEEDED FOR SLEEP      Not Delegated - Psychiatry:  Anxiolytics/Hypnotics Failed - 06/18/2019  1:31 PM      Failed - This refill cannot be delegated      Failed - Urine Drug Screen completed in last 360 days.      Passed - Valid encounter within last 6 months    Recent Outpatient Visits           2 months ago Yeast vaginitis   [facility], Devonne Doughty, DO   8 months ago Rheumatoid arthritis involving multiple sites with positive rheumatoid factor [facility])   [place], DO   10 months ago Subacute vaginitis   [place], DO   11 months ago Vasovagal syncope   [place], Devonne Doughty, DO   1 year ago Essential hypertension   [place], Devonne Doughty, DO

## 2019-06-19 DIAGNOSIS — R918 Other nonspecific abnormal finding of lung field: Secondary | ICD-10-CM | POA: Diagnosis not present

## 2019-06-28 ENCOUNTER — Other Ambulatory Visit: Payer: Self-pay

## 2019-06-28 DIAGNOSIS — Z170 Estrogen receptor positive status [ER+]: Secondary | ICD-10-CM

## 2019-06-28 DIAGNOSIS — C50211 Malignant neoplasm of upper-inner quadrant of right female breast: Secondary | ICD-10-CM

## 2019-07-10 ENCOUNTER — Other Ambulatory Visit: Payer: Self-pay | Admitting: Family Medicine

## 2019-07-10 DIAGNOSIS — B3731 Acute candidiasis of vulva and vagina: Secondary | ICD-10-CM

## 2019-07-10 DIAGNOSIS — B373 Candidiasis of vulva and vagina: Secondary | ICD-10-CM

## 2019-07-10 MED ORDER — FLUCONAZOLE 150 MG PO TABS: ORAL_TABLET | ORAL | 1 refills | Status: DC

## 2019-07-14 ENCOUNTER — Other Ambulatory Visit: Payer: Self-pay

## 2019-07-14 ENCOUNTER — Ambulatory Visit: Payer: Medicare PPO | Admitting: Family Medicine

## 2019-07-14 ENCOUNTER — Other Ambulatory Visit (HOSPITAL_COMMUNITY)
Admission: RE | Admit: 2019-07-14 | Discharge: 2019-07-14 | Disposition: A | Discharge status: Home / Self Care | Payer: Medicare PPO | Source: Ambulatory Visit | Attending: Family Medicine | Admitting: Family Medicine

## 2019-07-14 ENCOUNTER — Encounter: Payer: Self-pay | Admitting: Family Medicine

## 2019-07-14 VITALS — BP 109/70 | HR 67 | Temp 97.1°F | Ht 62.0 in | Wt 169.0 lb

## 2019-07-14 DIAGNOSIS — N760 Acute vaginitis: Secondary | ICD-10-CM | POA: Insufficient documentation

## 2019-07-14 DIAGNOSIS — R8290 Unspecified abnormal findings in urine: Secondary | ICD-10-CM | POA: Diagnosis not present

## 2019-07-14 LAB — POCT URINALYSIS DIPSTICK
Bilirubin, UA: NEGATIVE
Blood, UA: NEGATIVE
Glucose, UA: NEGATIVE
Ketones, UA: NEGATIVE
Nitrite, UA: NEGATIVE
Protein, UA: POSITIVE — AB
Spec Grav, UA: >=1.03 — AB (ref 1.010–1.025)
Urobilinogen, UA: 0.2 E.U./dL
pH, UA: 5 (ref 5.0–8.0)

## 2019-07-14 MED ORDER — METRONIDAZOLE 500 MG PO TABS: 500.0000 mg | ORAL_TABLET | Freq: Two times a day (BID) | ORAL | 0 refills | Status: AC

## 2019-07-14 NOTE — Progress Notes (Signed)
Subjective:    Patient ID: Elizabeth Bailey, female    DOB: [birth year], 79 y.o.   MRN: [id number]  Elizabeth Bailey is a 79 y.o. female presenting on 07/14/2019 for Vaginal Discharge (clear discharge , but denies any odor w/ the dischargex 3-4 weeks )   HPI  Ms. Dunsford presents to clinic for concerns of clear vaginal discharge x 3-4 weeks.  Denies any odor, fevers, abdominal pain, n/v/d.  Offered and declined STI testing.  Reports had gotten a prescription earlier this week for a yeast infection but has not seen any changes in her symptoms with that medication.  Depression screen [facility] 2/9 04/18/2019 01/03/2019 10/12/2018  Decreased Interest 0 0 0  Down, Depressed, Hopeless 0 0 0  PHQ - 2 Score 0 0 0  Altered sleeping - - -  Tired, decreased energy - - -  Change in appetite - - -  Feeling bad or failure about yourself  - - -  Trouble concentrating - - -  Moving slowly or fidgety/restless - - -  Suicidal thoughts - - -  PHQ-9 Score - - -  Difficult doing work/chores - - -    Social History   Tobacco Use  . Smoking status: Former Smoker    Packs/day: 0.50    Years: 10.00    Pack years: 5.00    Types: Cigarettes    Quit date: 1988    Years since quitting: 33.3  . Smokeless tobacco: Never Used  . Tobacco comment: quit 1988  Substance Use Topics  . Alcohol use: Yes    Alcohol/week: 0.0 standard drinks    Comment: wine twice a month   . Drug use: No    Review of Systems  Constitutional: Negative.   HENT: Negative.   Eyes: Negative.   Respiratory: Negative.   Cardiovascular: Negative.   Gastrointestinal: Negative.   Endocrine: Negative.   Genitourinary: Positive for vaginal discharge. Negative for decreased urine volume, difficulty urinating, dyspareunia, dysuria, enuresis, flank pain, frequency, genital sores, hematuria, menstrual problem, pelvic pain, urgency, vaginal bleeding and vaginal pain.  Musculoskeletal: Negative.   Skin: Negative.   Allergic/Immunologic: Negative.     Neurological: Negative.   Hematological: Negative.   Psychiatric/Behavioral: Negative.    Per HPI unless specifically indicated above     Objective:    BP 109/70   Pulse 67   Temp (!) 97.1 F (36.2 C)   Ht 5\' 2"  (1.575 m)   Wt 169 lb (76.7 kg)   SpO2 98%   BMI 30.91 kg/m   Wt Readings from Last 3 Encounters:  07/14/19 169 lb (76.7 kg)  01/03/19 150 lb (68 kg)  10/12/18 136 lb (61.7 kg)    Physical Exam Vitals reviewed.  Constitutional:      General: She is not in acute distress.    Appearance: Normal appearance. She is well-developed and well-groomed. She is obese. She is not ill-appearing or toxic-appearing.  HENT:     Head: Normocephalic.  Eyes:     General:        Right eye: No discharge.        Left eye: No discharge.     Conjunctiva/sclera: Conjunctivae normal.  Cardiovascular:     Pulses: Normal pulses.  Pulmonary:     Effort: Pulmonary effort is normal.  Abdominal:     Hernia: There is no hernia in the left inguinal area or right inguinal area.  Genitourinary:    General: Normal vulva.     Exam  position: Lithotomy position.     Pubic Area: No rash or pubic lice.      Labia:        Right: Rash present. No tenderness, lesion or injury.        Left: Rash present. No tenderness, lesion or injury.      Urethra: No urethral pain, urethral swelling or urethral lesion.     Vagina: No signs of injury and foreign body. Vaginal discharge present. No tenderness, bleeding or lesions.     Comments: Labia with redness bilaterally. Musculoskeletal:     Right lower leg: No edema.     Left lower leg: No edema.  Lymphadenopathy:     Lower Body: No right inguinal adenopathy. No left inguinal adenopathy.  Skin:    General: Skin is warm and dry.     Capillary Refill: Capillary refill takes less than 2 seconds.  Neurological:     General: No focal deficit present.     Mental Status: She is alert and oriented to person, place, and time.     Cranial Nerves: No cranial  nerve deficit.     Sensory: No sensory deficit.     Motor: No weakness.     Coordination: Coordination normal.     Gait: Gait normal.  Psychiatric:        Mood and Affect: Mood normal.        Behavior: Behavior normal.        Thought Content: Thought content normal.        Judgment: Judgment normal.    Results for orders placed or performed in visit on 07/14/19  POCT Urinalysis Dipstick  Result Value Ref Range   Color, UA dark yellow    Clarity, UA clear    Glucose, UA Negative Negative   Bilirubin, UA negative    Ketones, UA negative    Spec Grav, UA >=1.030 (A) 1.010 - 1.025   Blood, UA negative    pH, UA 5.0 5.0 - 8.0   Protein, UA Positive (A) Negative   Urobilinogen, UA 0.2 0.2 or 1.0 E.U./dL   Nitrite, UA negative    Leukocytes, UA Negative Negative   Appearance     Odor        Assessment & Plan:   Problem List Items Addressed This Visit      Genitourinary   Acute vaginitis - Primary    Likely Bacterial Vaginosis based on symptoms and being unresponsive to diflucan.  Vaginal exam completed and cervicovaginal swab taken.  Clear discharged noted on exam with reddening of labia bilaterally.    Plan: 1. Cervicovaginal swab sent to the lab for processing 2. To begin metronidazole 500mg , 1 tablet 2x per day for the next 7 days. 3. To follow up if having any worsening of symptoms or if symptoms fail to improve with current treatment.      Relevant Medications   metroNIDAZOLE (FLAGYL) 500 MG tablet   Other Relevant Orders   Cervicovaginal ancillary only    Other Visit Diagnoses    Abnormal urinalysis       Relevant Orders   POCT Urinalysis Dipstick (Completed)   Urine Culture      Meds ordered this encounter  Medications  . metroNIDAZOLE (FLAGYL) 500 MG tablet    Sig: Take 1 tablet (500 mg total) by mouth 2 (two) times daily for 7 days.    Dispense:  14 tablet    Refill:  0      Follow up plan: Return  if symptoms worsen or fail to improve.   Harlin Rain, [place] [facility] Nurse Practitioner [facility] 07/14/2019, 12:10 PM

## 2019-07-14 NOTE — Assessment & Plan Note (Signed)
Likely Bacterial Vaginosis based on symptoms and being unresponsive to diflucan.  Vaginal exam completed and cervicovaginal swab taken.  Clear discharged noted on exam with reddening of labia bilaterally.    Plan: 1. Cervicovaginal swab sent to the lab for processing 2. To begin metronidazole 500mg , 1 tablet 2x per day for the next 7 days. 3. To follow up if having any worsening of symptoms or if symptoms fail to improve with current treatment.

## 2019-07-14 NOTE — Patient Instructions (Signed)
As we discussed, I am treating you for presumed bacterial vaginosis, which is a pH imbalance that can lead to vaginal discharge and discomfort.  I have sent in a prescription for Metronidazole.  Take this prescription twice per day for the next 7 days.  This medication can be hard on your stomach, be sure to take it with food.    I have sent your vaginal swab to the lab for processing, we will call you when we receive the results.  We will plan to see you back as needed for this  You will receive a survey after today's visit either digitally by e-mail or paper by [place] mail. Your experiences and feedback matter to [place].  Please respond so we know how we are doing as we provide care for you.  Call us with any questions/concerns/needs.  It is my goal to be available to you for your health concerns.  Thanks for choosing me to be a partner in your healthcare needs!  Harlin Rain, FNP-C Family Nurse Practitioner [facility] Phone: [phone]

## 2019-07-15 LAB — URINE CULTURE
MICRO NUMBER:: 10480474
Result:: NO GROWTH
SPECIMEN QUALITY:: ADEQUATE

## 2019-07-17 DIAGNOSIS — M5416 Radiculopathy, lumbar region: Secondary | ICD-10-CM | POA: Diagnosis not present

## 2019-07-17 DIAGNOSIS — M5412 Radiculopathy, cervical region: Secondary | ICD-10-CM | POA: Diagnosis not present

## 2019-07-17 DIAGNOSIS — M5136 Other intervertebral disc degeneration, lumbar region: Secondary | ICD-10-CM | POA: Diagnosis not present

## 2019-07-17 DIAGNOSIS — M5030 Other cervical disc degeneration, unspecified cervical region: Secondary | ICD-10-CM | POA: Diagnosis not present

## 2019-07-18 LAB — CERVICOVAGINAL ANCILLARY ONLY
Bacterial Vaginitis (gardnerella): NEGATIVE
Candida Glabrata: NEGATIVE
Candida Vaginitis: NEGATIVE
Comment: NEGATIVE
Comment: NEGATIVE
Comment: NEGATIVE

## 2019-07-19 ENCOUNTER — Other Ambulatory Visit: Payer: Self-pay | Admitting: General Surgery

## 2019-07-19 DIAGNOSIS — C50211 Malignant neoplasm of upper-inner quadrant of right female breast: Secondary | ICD-10-CM

## 2019-07-19 DIAGNOSIS — Z170 Estrogen receptor positive status [ER+]: Secondary | ICD-10-CM

## 2019-08-04 ENCOUNTER — Other Ambulatory Visit: Payer: Medicare PPO

## 2019-08-10 DIAGNOSIS — M0579 Rheumatoid arthritis with rheumatoid factor of multiple sites without organ or systems involvement: Secondary | ICD-10-CM | POA: Diagnosis not present

## 2019-08-14 ENCOUNTER — Ambulatory Visit
Admission: RE | Admit: 2019-08-14 | Discharge: 2019-08-14 | Disposition: A | Discharge status: Home / Self Care | Payer: Medicare PPO | Source: Ambulatory Visit | Attending: General Surgery | Admitting: General Surgery

## 2019-08-14 DIAGNOSIS — C50211 Malignant neoplasm of upper-inner quadrant of right female breast: Secondary | ICD-10-CM | POA: Insufficient documentation

## 2019-08-14 DIAGNOSIS — Z170 Estrogen receptor positive status [ER+]: Secondary | ICD-10-CM

## 2019-08-14 DIAGNOSIS — Z853 Personal history of malignant neoplasm of breast: Secondary | ICD-10-CM | POA: Diagnosis not present

## 2019-08-14 DIAGNOSIS — N6489 Other specified disorders of breast: Secondary | ICD-10-CM | POA: Diagnosis not present

## 2019-08-14 DIAGNOSIS — R922 Inconclusive mammogram: Secondary | ICD-10-CM | POA: Diagnosis not present

## 2019-08-17 ENCOUNTER — Ambulatory Visit: Admission: RE | Admit: 2019-08-17 | Payer: Medicare PPO | Source: Ambulatory Visit | Admitting: Radiation Oncology

## 2019-08-17 DIAGNOSIS — G8929 Other chronic pain: Secondary | ICD-10-CM | POA: Diagnosis not present

## 2019-08-17 DIAGNOSIS — M25561 Pain in right knee: Secondary | ICD-10-CM | POA: Diagnosis not present

## 2019-08-17 DIAGNOSIS — M0579 Rheumatoid arthritis with rheumatoid factor of multiple sites without organ or systems involvement: Secondary | ICD-10-CM | POA: Diagnosis not present

## 2019-08-17 DIAGNOSIS — M79642 Pain in left hand: Secondary | ICD-10-CM | POA: Diagnosis not present

## 2019-08-17 DIAGNOSIS — M79641 Pain in right hand: Secondary | ICD-10-CM | POA: Diagnosis not present

## 2019-08-17 DIAGNOSIS — M25562 Pain in left knee: Secondary | ICD-10-CM | POA: Diagnosis not present

## 2019-08-18 ENCOUNTER — Other Ambulatory Visit: Payer: Self-pay | Admitting: Family Medicine

## 2019-08-18 DIAGNOSIS — F5101 Primary insomnia: Secondary | ICD-10-CM

## 2019-08-22 DIAGNOSIS — C50211 Malignant neoplasm of upper-inner quadrant of right female breast: Secondary | ICD-10-CM | POA: Diagnosis not present

## 2019-09-05 DIAGNOSIS — H903 Sensorineural hearing loss, bilateral: Secondary | ICD-10-CM | POA: Diagnosis not present

## 2019-09-11 ENCOUNTER — Other Ambulatory Visit: Payer: Self-pay | Admitting: Family Medicine

## 2019-09-11 DIAGNOSIS — M19041 Primary osteoarthritis, right hand: Secondary | ICD-10-CM

## 2019-09-11 DIAGNOSIS — M0579 Rheumatoid arthritis with rheumatoid factor of multiple sites without organ or systems involvement: Secondary | ICD-10-CM

## 2019-09-11 DIAGNOSIS — M5412 Radiculopathy, cervical region: Secondary | ICD-10-CM

## 2019-09-11 NOTE — Telephone Encounter (Signed)
Requested medication (s) are due for refill today: yes  Requested medication (s) are on the active medication list: yes  Last refill: 05/19/19  #60 2 refills  Future visit scheduled: No  Notes to clinic:  Not delegated    Requested Prescriptions  Pending Prescriptions Disp Refills   pregabalin (LYRICA) 75 MG capsule [Pharmacy Med Name: PREGABALIN 75 MG CAP] 60 capsule     Sig: TAKE 1 CAPSULE BY MOUTH TWICE DAILY      Not Delegated - Neurology:  Anticonvulsants - Controlled Failed - 09/11/2019  9:06 AM      Failed - This refill cannot be delegated      Passed - Valid encounter within last 12 months    Recent Outpatient Visits           1 month ago Acute vaginitis   [facility], Lupita Raider, FNP   4 months ago Yeast vaginitis   [facility] Olin Hauser, DO   11 months ago Rheumatoid arthritis involving multiple sites with positive rheumatoid factor [facility])   [facility] Olin Hauser, DO   1 year ago Subacute vaginitis   [place], DO   1 year ago Vasovagal syncope   [place], Devonne Doughty, DO

## 2019-09-16 ENCOUNTER — Other Ambulatory Visit: Payer: Self-pay | Admitting: Family Medicine

## 2019-09-16 DIAGNOSIS — K219 Gastro-esophageal reflux disease without esophagitis: Secondary | ICD-10-CM

## 2019-09-16 NOTE — Telephone Encounter (Signed)
Requested Prescriptions  Pending Prescriptions Disp Refills   omeprazole (PRILOSEC) 40 MG capsule [Pharmacy Med Name: OMEPRAZOLE DR 40 MG CAP] 90 capsule 1    Sig: TAKE 1 CAPSULE BY MOUTH ONCE DAILY BEFORE BREAKFAST     Gastroenterology: Proton Pump Inhibitors Passed - 09/16/2019 11:41 AM      Passed - Valid encounter within last 12 months    Recent Outpatient Visits          2 months ago Acute vaginitis   [facility], Lupita Raider, FNP   5 months ago Yeast vaginitis   [facility] Olin Hauser, DO   11 months ago Rheumatoid arthritis involving multiple sites with positive rheumatoid factor [facility])   [facility] Olin Hauser, DO   1 year ago Subacute vaginitis   [place], DO   1 year ago Vasovagal syncope   [place], Devonne Doughty, DO

## 2019-09-28 ENCOUNTER — Ambulatory Visit: Payer: Medicare PPO | Admitting: Family Medicine

## 2019-09-28 ENCOUNTER — Encounter: Payer: Self-pay | Admitting: Family Medicine

## 2019-09-28 ENCOUNTER — Other Ambulatory Visit: Payer: Self-pay

## 2019-09-28 ENCOUNTER — Other Ambulatory Visit (HOSPITAL_COMMUNITY)
Admission: RE | Admit: 2019-09-28 | Discharge: 2019-09-28 | Disposition: A | Discharge status: Home / Self Care | Payer: Medicare PPO | Source: Ambulatory Visit | Attending: Family Medicine | Admitting: Family Medicine

## 2019-09-28 VITALS — BP 117/56 | HR 89 | Resp 20 | Wt 171.6 lb

## 2019-09-28 DIAGNOSIS — N760 Acute vaginitis: Secondary | ICD-10-CM | POA: Diagnosis not present

## 2019-09-28 MED ORDER — METRONIDAZOLE 500 MG PO TABS: 500.0000 mg | ORAL_TABLET | Freq: Two times a day (BID) | ORAL | 0 refills | Status: AC

## 2019-09-28 NOTE — Patient Instructions (Addendum)
I have sent your swab to the lab for processing and we will contact you when we get the results.  I have sent in a prescription for metronidazole 500mg , to take 1 tablet 2x per day for the next 7 days for probable bacterial vaginosis.  A referral to OB/GYN has been placed today.  If you have not heard from the specialty office or our referral coordinator within 1 week, please let us know and we will follow up with the referral coordinator for an update.  We will plan to see you back if symptoms worsen or fail to improve with current treatment  You will receive a survey after today's visit either digitally by e-mail or paper by [employer] mail. Your experiences and feedback matter to [place].  Please respond so we know how we are doing as we provide care for you.  Call us with any questions/concerns/needs.  It is my goal to be available to you for your health concerns.  Thanks for choosing me to be a partner in your healthcare needs!  Harlin Rain, FNP-C Family Nurse Practitioner [facility] Phone: [phone]

## 2019-09-28 NOTE — Progress Notes (Signed)
Subjective:    Patient ID: Elizabeth Bailey, female    DOB: Jul 01, 1940, 79 y.o.   MRN: [id number]  Elizabeth Bailey is a 79 y.o. female presenting on 09/28/2019 for Vaginal Discharge (clear vaginal discharge x 3 weeks. She complains of an irritation like chaff feeling. she denies any odor w/ discharge.)   HPI  Elizabeth Bailey presents to clinic for concerns of vaginal discharge x 3 weeks.  Reports irritation like feeling without odor.  No itching.  Denies dysuria, urinary urgency, hesitancy, frequency, feelings of incomplete emptying, or hematuria.  Offered and declined STI testing.  Depression screen [facility] 2/9 04/18/2019 01/03/2019 10/12/2018  Decreased Interest 0 0 0  Down, Depressed, Hopeless 0 0 0  PHQ - 2 Score 0 0 0  Altered sleeping - - -  Tired, decreased energy - - -  Change in appetite - - -  Feeling bad or failure about yourself  - - -  Trouble concentrating - - -  Moving slowly or fidgety/restless - - -  Suicidal thoughts - - -  PHQ-9 Score - - -  Difficult doing work/chores - - -    Social History   Tobacco Use  . Smoking status: Former Smoker    Packs/day: 0.50    Years: 10.00    Pack years: 5.00    Types: Cigarettes    Quit date: 1988    Years since quitting: 33.5  . Smokeless tobacco: Never Used  . Tobacco comment: quit 1988  Vaping Use  . Vaping Use: Never used  Substance Use Topics  . Alcohol use: Yes    Alcohol/week: 0.0 standard drinks    Comment: wine twice a month   . Drug use: No    Review of Systems  Constitutional: Negative.   HENT: Negative.   Eyes: Negative.   Respiratory: Negative.   Cardiovascular: Negative.   Gastrointestinal: Negative.   Endocrine: Negative.   Genitourinary: Positive for vaginal discharge. Negative for decreased urine volume, difficulty urinating, dyspareunia, dysuria, enuresis, flank pain, frequency, genital sores, hematuria, menstrual problem, pelvic pain, urgency, vaginal bleeding and vaginal pain.  Musculoskeletal: Negative.     Skin: Negative.   Allergic/Immunologic: Negative.   Neurological: Negative.   Hematological: Negative.   Psychiatric/Behavioral: Negative.    Per HPI unless specifically indicated above     Objective:    BP (!) 117/56   Pulse 89   Resp 20   Wt 171 lb 9.6 oz (77.8 kg)   SpO2 96%   BMI 31.39 kg/m   Wt Readings from Last 3 Encounters:  09/28/19 171 lb 9.6 oz (77.8 kg)  07/14/19 169 lb (76.7 kg)  01/03/19 150 lb (68 kg)    Physical Exam Vitals reviewed.  Constitutional:      General: She is not in acute distress.    Appearance: Normal appearance. She is well-developed and well-groomed. She is obese. She is not ill-appearing or toxic-appearing.  HENT:     Head: Normocephalic.     Nose:     Comments: Elizabeth Bailey is in place, covering mouth and nose. Eyes:     General: Lids are normal. Vision grossly intact. No scleral icterus.       Right eye: No discharge.        Left eye: No discharge.     Extraocular Movements: Extraocular movements intact.     Conjunctiva/sclera: Conjunctivae normal.     Pupils: Pupils are equal, round, and reactive to light.  Cardiovascular:     Pulses:  Normal pulses.          Dorsalis pedis pulses are 2+ on the right side and 2+ on the left side.  Pulmonary:     Effort: Pulmonary effort is normal.  Abdominal:     General: Abdomen is flat.     Palpations: Abdomen is soft. There is no hepatomegaly or splenomegaly.     Tenderness: There is no abdominal tenderness.     Hernia: There is no hernia in the left inguinal area or right inguinal area.  Genitourinary:    General: Normal vulva.     Exam position: Lithotomy position.     Pubic Area: No rash or pubic lice.      Labia:        Right: No rash, tenderness, lesion or injury.        Left: No rash, tenderness, lesion or injury.      Urethra: No urethral pain, urethral swelling or urethral lesion.     Vagina: No signs of injury and foreign body. Vaginal discharge present. No erythema, tenderness,  bleeding or lesions.  Musculoskeletal:     Right lower leg: No edema.     Left lower leg: No edema.  Feet:     Right foot:     Skin integrity: Skin integrity normal.     Left foot:     Skin integrity: Skin integrity normal.  Lymphadenopathy:     Lower Body: No right inguinal adenopathy. No left inguinal adenopathy.  Skin:    General: Skin is warm and dry.     Capillary Refill: Capillary refill takes less than 2 seconds.  Neurological:     General: No focal deficit present.     Mental Status: She is alert and oriented to person, place, and time.     Cranial Nerves: No cranial nerve deficit.     Sensory: No sensory deficit.     Motor: No weakness.     Coordination: Coordination normal.     Gait: Gait normal.  Psychiatric:        Attention and Perception: Attention and perception normal.        Mood and Affect: Mood and affect normal.        Speech: Speech normal.        Behavior: Behavior normal. Behavior is cooperative.        Thought Content: Thought content normal.        Judgment: Judgment normal.    Results for orders placed or performed in visit on 07/14/19  Urine Culture   Specimen: Urine  Result Value Ref Range   MICRO NUMBER: [id number]    SPECIMEN QUALITY: Adequate    Sample Source URINE, CLEAN CATCH    STATUS: FINAL    Result: No Growth   POCT Urinalysis Dipstick  Result Value Ref Range   Color, UA dark yellow    Clarity, UA clear    Glucose, UA Negative Negative   Bilirubin, UA negative    Ketones, UA negative    Spec Grav, UA >=1.030 (A) 1.010 - 1.025   Blood, UA negative    pH, UA 5.0 5.0 - 8.0   Protein, UA Positive (A) Negative   Urobilinogen, UA 0.2 0.2 or 1.0 E.U./dL   Nitrite, UA negative    Leukocytes, UA Moderate (2+) (A) Negative   Appearance     Odor    Cervicovaginal ancillary only  Result Value Ref Range   Bacterial Vaginitis (gardnerella) Negative    Candida Vaginitis Negative  Candida Glabrata Negative    Comment      Normal  Reference Range Bacterial Vaginosis - Negative   Comment Normal Reference Range Candida Species - Negative    Comment Normal Reference Range Candida Galbrata - Negative       Assessment & Plan:   Problem List Items Addressed This Visit      Genitourinary   Acute vaginitis - Primary    Vaginal discharge x 3 weeks, creamy white on exam, responsive at 07/14/2019 visit to metronidazole, likely BV.  Cervicovaginal swab taken today and sent to the lab for processing.  Discussed with patient, recurrence of symptoms and history of vaginal/rectal fistula, ideal to establish with a GYN provider.  Patient requesting referral.  Plan: 1. Cervicovaginal swab sent to the lab for processing 2. Begin metronidazole 500mg  BID x 7 days 3. Referral placed to GYN      Relevant Medications   metroNIDAZOLE (FLAGYL) 500 MG tablet   Other Relevant Orders   Ambulatory referral to Obstetrics / Gynecology   Cervicovaginal ancillary only      Meds ordered this encounter  Medications  . metroNIDAZOLE (FLAGYL) 500 MG tablet    Sig: Take 1 tablet (500 mg total) by mouth 2 (two) times daily for 7 days.    Dispense:  14 tablet    Refill:  0      Follow up plan: Return if symptoms worsen or fail to improve.   Harlin Rain, [place] [facility] Nurse Practitioner [facility] 09/28/2019, 2:13 PM

## 2019-09-28 NOTE — Assessment & Plan Note (Signed)
Vaginal discharge x 3 weeks, creamy white on exam, responsive at 07/14/2019 visit to metronidazole, likely BV.  Cervicovaginal swab taken today and sent to the lab for processing.  Discussed with patient, recurrence of symptoms and history of vaginal/rectal fistula, ideal to establish with a GYN provider.  Patient requesting referral.  Plan: 1. Cervicovaginal swab sent to the lab for processing 2. Begin metronidazole 500mg  BID x 7 days 3. Referral placed to GYN

## 2019-10-02 ENCOUNTER — Other Ambulatory Visit: Payer: Self-pay | Admitting: Family Medicine

## 2019-10-02 DIAGNOSIS — B373 Candidiasis of vulva and vagina: Secondary | ICD-10-CM

## 2019-10-02 DIAGNOSIS — B3731 Acute candidiasis of vulva and vagina: Secondary | ICD-10-CM

## 2019-10-02 LAB — CERVICOVAGINAL ANCILLARY ONLY
Bacterial Vaginitis (gardnerella): NEGATIVE
Candida Glabrata: NEGATIVE
Candida Vaginitis: POSITIVE — AB
Comment: NEGATIVE
Comment: NEGATIVE
Comment: NEGATIVE

## 2019-10-02 MED ORDER — FLUCONAZOLE 150 MG PO TABS: ORAL_TABLET | ORAL | 1 refills | Status: DC

## 2019-10-03 ENCOUNTER — Telehealth: Payer: Self-pay | Admitting: Obstetrics & Gynecology

## 2019-10-03 NOTE — Telephone Encounter (Signed)
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 Called and left voicemail for patient to call back to be scheduled.

## 2019-10-03 NOTE — Telephone Encounter (Signed)
Scheduled 8/17 with Dr. Gilman Schmidt.

## 2019-10-11 DIAGNOSIS — N898 Other specified noninflammatory disorders of vagina: Secondary | ICD-10-CM | POA: Diagnosis not present

## 2019-10-11 DIAGNOSIS — B373 Candidiasis of vulva and vagina: Secondary | ICD-10-CM | POA: Diagnosis not present

## 2019-10-16 DIAGNOSIS — M5030 Other cervical disc degeneration, unspecified cervical region: Secondary | ICD-10-CM | POA: Diagnosis not present

## 2019-10-16 DIAGNOSIS — M5416 Radiculopathy, lumbar region: Secondary | ICD-10-CM | POA: Diagnosis not present

## 2019-10-16 DIAGNOSIS — M5136 Other intervertebral disc degeneration, lumbar region: Secondary | ICD-10-CM | POA: Diagnosis not present

## 2019-10-16 DIAGNOSIS — M5412 Radiculopathy, cervical region: Secondary | ICD-10-CM | POA: Diagnosis not present

## 2019-10-17 ENCOUNTER — Encounter: Payer: Medicare PPO | Admitting: Obstetrics and Gynecology

## 2019-11-11 DIAGNOSIS — M069 Rheumatoid arthritis, unspecified: Secondary | ICD-10-CM | POA: Diagnosis not present

## 2019-11-11 DIAGNOSIS — Z87891 Personal history of nicotine dependence: Secondary | ICD-10-CM | POA: Diagnosis not present

## 2019-11-11 DIAGNOSIS — I10 Essential (primary) hypertension: Secondary | ICD-10-CM | POA: Diagnosis not present

## 2019-11-11 DIAGNOSIS — Z6831 Body mass index (BMI) 31.0-31.9, adult: Secondary | ICD-10-CM | POA: Diagnosis not present

## 2019-11-11 DIAGNOSIS — C50919 Malignant neoplasm of unspecified site of unspecified female breast: Secondary | ICD-10-CM | POA: Diagnosis not present

## 2019-11-11 DIAGNOSIS — E785 Hyperlipidemia, unspecified: Secondary | ICD-10-CM | POA: Diagnosis not present

## 2019-11-11 DIAGNOSIS — G8929 Other chronic pain: Secondary | ICD-10-CM | POA: Diagnosis not present

## 2019-11-11 DIAGNOSIS — Z8249 Family history of ischemic heart disease and other diseases of the circulatory system: Secondary | ICD-10-CM | POA: Diagnosis not present

## 2019-11-11 DIAGNOSIS — J449 Chronic obstructive pulmonary disease, unspecified: Secondary | ICD-10-CM | POA: Diagnosis not present

## 2019-11-11 DIAGNOSIS — Z79811 Long term (current) use of aromatase inhibitors: Secondary | ICD-10-CM | POA: Diagnosis not present

## 2019-11-11 DIAGNOSIS — E669 Obesity, unspecified: Secondary | ICD-10-CM | POA: Diagnosis not present

## 2019-11-11 DIAGNOSIS — Z79899 Other long term (current) drug therapy: Secondary | ICD-10-CM | POA: Diagnosis not present

## 2019-11-11 DIAGNOSIS — G4700 Insomnia, unspecified: Secondary | ICD-10-CM | POA: Diagnosis not present

## 2019-11-11 DIAGNOSIS — J301 Allergic rhinitis due to pollen: Secondary | ICD-10-CM | POA: Diagnosis not present

## 2019-11-16 DIAGNOSIS — M9901 Segmental and somatic dysfunction of cervical region: Secondary | ICD-10-CM | POA: Diagnosis not present

## 2019-11-16 DIAGNOSIS — M5442 Lumbago with sciatica, left side: Secondary | ICD-10-CM | POA: Diagnosis not present

## 2019-11-16 DIAGNOSIS — M9903 Segmental and somatic dysfunction of lumbar region: Secondary | ICD-10-CM | POA: Diagnosis not present

## 2019-11-16 DIAGNOSIS — M531 Cervicobrachial syndrome: Secondary | ICD-10-CM | POA: Diagnosis not present

## 2019-11-18 ENCOUNTER — Other Ambulatory Visit: Payer: Self-pay | Admitting: Family Medicine

## 2019-11-18 DIAGNOSIS — F5101 Primary insomnia: Secondary | ICD-10-CM

## 2019-11-18 NOTE — Telephone Encounter (Signed)
Requested medication (s) are due for refill today: yes  Requested medication (s) are on the active medication list: yes  Last refill:  08/18/19  Future visit scheduled: no  Notes to clinic:  med not delegated to NT to RF   Requested Prescriptions  Pending Prescriptions Disp Refills   zolpidem (AMBIEN) 5 MG tablet [Pharmacy Med Name: ZOLPIDEM TARTRATE 5 MG TAB] 30 tablet     Sig: TAKE 1 TABLET BY MOUTH AT BEDTIME AS NEEDED FOR SLEEP      Not Delegated - Psychiatry:  Anxiolytics/Hypnotics Failed - 11/18/2019 10:38 AM      Failed - This refill cannot be delegated      Failed - Urine Drug Screen completed in last 360 days.      Passed - Valid encounter within last 6 months    Recent Outpatient Visits           1 month ago Acute vaginitis   [facility], Lupita Raider, FNP   4 months ago Acute vaginitis   [facility], Lupita Raider, FNP   7 months ago Yeast vaginitis   [facility], Devonne Doughty, DO   1 year ago Rheumatoid arthritis involving multiple sites with positive rheumatoid factor [facility])   [facility] Olin Hauser, DO   1 year ago Subacute vaginitis   [place], [place]

## 2019-12-04 DIAGNOSIS — R918 Other nonspecific abnormal finding of lung field: Secondary | ICD-10-CM | POA: Diagnosis not present

## 2019-12-04 DIAGNOSIS — M9901 Segmental and somatic dysfunction of cervical region: Secondary | ICD-10-CM | POA: Diagnosis not present

## 2019-12-04 DIAGNOSIS — M531 Cervicobrachial syndrome: Secondary | ICD-10-CM | POA: Diagnosis not present

## 2019-12-04 DIAGNOSIS — R911 Solitary pulmonary nodule: Secondary | ICD-10-CM | POA: Diagnosis not present

## 2019-12-04 DIAGNOSIS — M5442 Lumbago with sciatica, left side: Secondary | ICD-10-CM | POA: Diagnosis not present

## 2019-12-04 DIAGNOSIS — M9903 Segmental and somatic dysfunction of lumbar region: Secondary | ICD-10-CM | POA: Diagnosis not present

## 2019-12-14 ENCOUNTER — Other Ambulatory Visit: Payer: Self-pay | Admitting: Family Medicine

## 2019-12-14 DIAGNOSIS — M19042 Primary osteoarthritis, left hand: Secondary | ICD-10-CM

## 2019-12-14 DIAGNOSIS — M5412 Radiculopathy, cervical region: Secondary | ICD-10-CM

## 2019-12-14 DIAGNOSIS — M0579 Rheumatoid arthritis with rheumatoid factor of multiple sites without organ or systems involvement: Secondary | ICD-10-CM

## 2019-12-14 DIAGNOSIS — M19041 Primary osteoarthritis, right hand: Secondary | ICD-10-CM

## 2019-12-14 NOTE — Telephone Encounter (Signed)
Requested medication (s) are due for refill today: yes  Requested medication (s) are on the active medication list: Yes  Last refill:  11/09/19  Future visit scheduled: No  Notes to clinic:  not delegated    Requested Prescriptions  Pending Prescriptions Disp Refills   pregabalin (LYRICA) 75 MG capsule [Pharmacy Med Name: PREGABALIN 75 MG CAP] 60 capsule     Sig: TAKE 1 CAPSULE BY MOUTH TWICE DAILY      Not Delegated - Neurology:  Anticonvulsants - Controlled Failed - 12/14/2019  8:48 AM      Failed - This refill cannot be delegated      Passed - Valid encounter within last 12 months    Recent Outpatient Visits           2 months ago Acute vaginitis   [facility], Lupita Raider, FNP   5 months ago Acute vaginitis   [facility], Lupita Raider, FNP   8 months ago Yeast vaginitis   [place], [place]   1 year ago Rheumatoid arthritis involving multiple sites with positive rheumatoid factor [facility])   [facility] Olin Hauser, DO   1 year ago Subacute vaginitis   [place], Devonne Doughty, [place]

## 2019-12-16 ENCOUNTER — Other Ambulatory Visit: Payer: Self-pay | Admitting: Family Medicine

## 2019-12-16 DIAGNOSIS — J302 Other seasonal allergic rhinitis: Secondary | ICD-10-CM

## 2019-12-16 DIAGNOSIS — F5101 Primary insomnia: Secondary | ICD-10-CM

## 2019-12-16 NOTE — Telephone Encounter (Signed)
Requested medication (s) are due for refill today: yes  Requested medication (s) are on the active medication list: yes  Last refill:  11/20/19  Future visit scheduled: no  Notes to clinic:  med not delegated to NT to RF Called pt and LM on VM to call office to schedule an appt.   Requested Prescriptions  Pending Prescriptions Disp Refills   zolpidem (AMBIEN) 5 MG tablet [Pharmacy Med Name: ZOLPIDEM TARTRATE 5 MG TAB] 30 tablet     Sig: TAKE 1 TABLET BY MOUTH AT BEDTIME AS NEEDED FOR SLEEP      Not Delegated - Psychiatry:  Anxiolytics/Hypnotics Failed - 12/16/2019 12:32 PM      Failed - This refill cannot be delegated      Failed - Urine Drug Screen completed in last 360 days.      Passed - Valid encounter within last 6 months    Recent Outpatient Visits           2 months ago Acute vaginitis   [facility], Lupita Raider, FNP   5 months ago Acute vaginitis   [facility], Lupita Raider, FNP   8 months ago Yeast vaginitis   [facility] [place], Devonne Doughty, DO   1 year ago Rheumatoid arthritis involving multiple sites with positive rheumatoid factor [facility])   [facility] Olin Hauser, DO   1 year ago Subacute vaginitis   [place], DO               Signed Prescriptions Disp Refills   montelukast (SINGULAIR) 10 MG tablet 90 tablet 1    Sig: TAKE 1 TABLET BY MOUTH ONCE DAILY      Pulmonology:  Leukotriene Inhibitors Passed - 12/16/2019 12:32 PM      Passed - Valid encounter within last 12 months    Recent Outpatient Visits           2 months ago Acute vaginitis   [facility], Lupita Raider, FNP   5 months ago Acute vaginitis   [facility], Lupita Raider, FNP   8 months ago Yeast vaginitis   [facility] [place], Devonne Doughty, DO   1 year ago Rheumatoid arthritis involving multiple sites  with positive rheumatoid factor [facility])   [place], DO   1 year ago Subacute vaginitis   [place], Devonne Doughty, [place]

## 2019-12-16 NOTE — Telephone Encounter (Signed)
Requested Prescriptions  Pending Prescriptions Disp Refills  . zolpidem (AMBIEN) 5 MG tablet [Pharmacy Med Name: ZOLPIDEM TARTRATE 5 MG TAB] 30 tablet     Sig: TAKE 1 TABLET BY MOUTH AT BEDTIME AS NEEDED FOR SLEEP     Not Delegated - Psychiatry:  Anxiolytics/Hypnotics Failed - 12/16/2019 12:32 PM      Failed - This refill cannot be delegated      Failed - Urine Drug Screen completed in last 360 days.      Passed - Valid encounter within last 6 months    Recent Outpatient Visits          2 months ago Acute vaginitis   [facility], Lupita Raider, FNP   5 months ago Acute vaginitis   [facility], Lupita Raider, FNP   8 months ago Yeast vaginitis   [facility] [place], Devonne Doughty, DO   1 year ago Rheumatoid arthritis involving multiple sites with positive rheumatoid factor [facility])   [facility] Olin Hauser, DO   1 year ago Subacute vaginitis   [facility], Devonne Doughty, DO             . montelukast (SINGULAIR) 10 MG tablet [Pharmacy Med Name: MONTELUKAST SODIUM 10 MG TAB] 90 tablet 1    Sig: TAKE 1 TABLET BY MOUTH ONCE DAILY     Pulmonology:  Leukotriene Inhibitors Passed - 12/16/2019 12:32 PM      Passed - Valid encounter within last 12 months    Recent Outpatient Visits          2 months ago Acute vaginitis   [facility], Lupita Raider, FNP   5 months ago Acute vaginitis   [facility], Lupita Raider, FNP   8 months ago Yeast vaginitis   [place], DO   1 year ago Rheumatoid arthritis involving multiple sites with positive rheumatoid factor [facility])   [place], DO   1 year ago Subacute vaginitis   [place], [place]

## 2019-12-25 ENCOUNTER — Ambulatory Visit: Payer: Medicare PPO | Attending: Internal Medicine

## 2019-12-25 DIAGNOSIS — Z23 Encounter for immunization: Secondary | ICD-10-CM

## 2019-12-25 NOTE — Progress Notes (Signed)
   Covid-19 Vaccination Clinic  Name:  Elizabeth Bailey    MRN: [id number] DOB: May 08, 1940  12/25/2019  [name] was observed post Covid-19 immunization for 15 minutes without incident. She was provided with Vaccine Information Sheet and instruction to access the V-Safe system.   [name] was instructed to call 911 with any severe reactions post vaccine: Marland Kitchen Difficulty breathing  . Swelling of face and throat  . A fast heartbeat  . A bad rash all over body  . Dizziness and weakness

## 2020-01-01 DIAGNOSIS — M9903 Segmental and somatic dysfunction of lumbar region: Secondary | ICD-10-CM | POA: Diagnosis not present

## 2020-01-01 DIAGNOSIS — M4607 Spinal enthesopathy, lumbosacral region: Secondary | ICD-10-CM | POA: Diagnosis not present

## 2020-01-16 ENCOUNTER — Other Ambulatory Visit: Payer: Self-pay | Admitting: Family Medicine

## 2020-01-16 ENCOUNTER — Ambulatory Visit (INDEPENDENT_AMBULATORY_CARE_PROVIDER_SITE_OTHER): Payer: Medicare PPO

## 2020-01-16 VITALS — Ht 64.0 in | Wt 168.0 lb

## 2020-01-16 DIAGNOSIS — Z0000 Encounter for general adult medical examination without abnormal findings: Secondary | ICD-10-CM | POA: Diagnosis not present

## 2020-01-16 DIAGNOSIS — F5101 Primary insomnia: Secondary | ICD-10-CM

## 2020-01-16 NOTE — Progress Notes (Signed)
I connected with Richardine Service today by telephone and verified that I am speaking with the correct person using two identifiers. Location patient: home Location provider: work Persons participating in the virtual visit: Conception Doebler, Glenna Durand LPN.   I discussed the limitations, risks, security and privacy concerns of performing an evaluation and management service by telephone and the availability of in person appointments. I also discussed with the patient that there may be a patient responsible charge related to this service. The patient expressed understanding and verbally consented to this telephonic visit.    Interactive audio and video telecommunications were attempted between this provider and patient, however failed, due to patient having technical difficulties OR patient did not have access to video capability.  We continued and completed visit with audio only.     Vital signs may be patient reported or missing.  Subjective:   Elizabeth Bailey is a 79 y.o. female who presents for Medicare Annual (Subsequent) preventive examination.  Review of Systems     Cardiac Risk Factors include: advanced age (>81men, >34 women);sedentary lifestyle     Objective:    Today's Vitals   01/16/20 0827  Weight: 168 lb (76.2 kg)  Height: 5\' 4"  (1.626 m)   Body mass index is 28.84 kg/m.  Advanced Directives 01/16/2020 08/11/2018 01/16/2018 01/05/2018 08/12/2017 08/06/2016 05/05/2016  Does Patient Have a Medical Advance Directive? Yes No Yes Yes No No No;Yes  Type of Advance Directive [place];Living will - - [place];Living will  Does patient want to make changes to medical advance directive? - - - No - Patient declined - - -  Copy of [place] in Chart? No - copy requested - - No - copy requested - - Yes  Would patient like information on creating a medical advance directive? - No - Patient declined - - No  - Patient declined No - Patient declined No - Patient declined    Current Medications (verified) Outpatient Encounter Medications as of 01/16/2020  Medication Sig  . acetaminophen (TYLENOL) 325 MG tablet Take 650 mg by mouth every 6 (six) hours as needed for mild pain.  . Adalimumab (HUMIRA PEN) 40 MG/0.8ML PNKT Inject 40 mg into the skin every 21 ( twenty-one) days. TAKES EVERY OTHER WEEK  LAST DOSE WAS IN 01-2016  . albuterol (PROVENTIL HFA;VENTOLIN HFA) 108 (90 Base) MCG/ACT inhaler Inhale 2 puffs into the lungs every 6 (six) hours as needed for wheezing or shortness of breath.  . ALBUTEROL IN Inhale 2 Inhalers into the lungs every 6 (six) hours as needed.  Marland Kitchen amLODipine (NORVASC) 5 MG tablet TAKE 1 TABLET BY MOUTH ONCE DAILY  . Apoaequorin (PREVAGEN EXTRA STRENGTH PO) Take by mouth.  . budesonide-formoterol (SYMBICORT) 160-4.5 MCG/ACT inhaler Inhale 1 puff into the lungs 2 (two) times daily.  . calcium-vitamin D (OSCAL WITH D) 500-200 MG-UNIT TABS tablet Take by mouth.  . diclofenac sodium (VOLTAREN) 1 % GEL Apply 3 grams topically qid prn  . feeding supplement (BOOST / RESOURCE BREEZE) LIQD Take 1 Container by mouth 3 (three) times daily between meals.  . fluticasone (FLONASE) 50 MCG/ACT nasal spray Place 2 sprays into both nostrils daily. Use for 4-6 weeks then stop and use seasonally or as needed.  Marland Kitchen HYDROcodone-acetaminophen (NORCO/VICODIN) 5-325 MG tablet Take 1 tablet by mouth every 6 (six) hours as needed.  Marland Kitchen letrozole (FEMARA) 2.5 MG tablet TAKE 1 TABLET BY MOUTH ONCE DAILY  .  Melatonin 10 MG TABS Take 2 tablets by mouth at bedtime. Reported on 06/03/2015  . montelukast (SINGULAIR) 10 MG tablet TAKE 1 TABLET BY MOUTH ONCE DAILY  . Omega 3 1000 MG CAPS Take 1,000 mg by mouth daily. omega-3 fatty acids-vitamin E (FISH OIL) 1,000 mg  . omeprazole (PRILOSEC) 40 MG capsule TAKE 1 CAPSULE BY MOUTH ONCE DAILY BEFORE BREAKFAST  . pregabalin (LYRICA) 75 MG capsule TAKE 1 CAPSULE BY MOUTH  TWICE DAILY  . simvastatin (ZOCOR) 20 MG tablet Take 1 tablet (20 mg total) by mouth every other day. Take at bedtime  . zolpidem (AMBIEN) 5 MG tablet TAKE 1 TABLET BY MOUTH AT BEDTIME AS NEEDED FOR SLEEP  . fluconazole (DIFLUCAN) 150 MG tablet Take one tablet by mouth on Day 1. Repeat dose 2nd tablet on Day 3. (Patient not taking: Reported on 01/16/2020)  . vitamin B-12 (CYANOCOBALAMIN) 1000 MCG tablet Take 1,000 mcg by mouth daily. (Patient not taking: Reported on 09/28/2019)   No facility-administered encounter medications on file as of 01/16/2020.    Allergies (verified) Flucytosine, Fluticasone-salmeterol, Naproxen, and Other   History: Past Medical History:  Diagnosis Date  . Arthritis   . Asthma   . Breast cancer ([place]) 03/27/2016   Invasive mammary carcinoma right breast pT1c  pN0 (12 mm) MAMMOPRINT: LOW RISK; ER/PR positive  DCIS medial margin 1 mm  . Chronic bronchitis ([place])   . Collagen vascular disease ([place])   . Depression   . Diverticulosis   . GERD (gastroesophageal reflux disease)   . High cholesterol   . Hypertension   . Personal history of radiation therapy   . Seasonal allergies    Past Surgical History:  Procedure Laterality Date  . ABDOMINAL HYSTERECTOMY    . BREAST BIOPSY  1970's  . BREAST BIOPSY Right 03/27/2016   INVASIVE MAMMARY CARCINOMA  . BREAST LUMPECTOMY Right 04/09/2016   radiation  . BREAST LUMPECTOMY WITH SENTINEL LYMPH NODE BIOPSY Right 04/09/2016   Procedure: BREAST LUMPECTOMY WITH SENTINEL LYMPH NODE BX;  Surgeon: Christene Lye, MD;  Location: [facility] ORS;  Service: General;  Laterality: Right;  . CAROTID ENDARTERECTOMY Left   . CHOLECYSTECTOMY    . COLONOSCOPY  2005  . DG  BONE DENSITY ([place] HX)  2010  . DIAGNOSTIC MAMMOGRAM  2015  . ESOPHAGOGASTRODUODENOSCOPY (EGD) WITH PROPOFOL N/A 05/05/2016   Procedure: ESOPHAGOGASTRODUODENOSCOPY (EGD) WITH PROPOFOL;  Surgeon: Lucilla Lame, MD;  Location: [facility] ENDOSCOPY;  Service: Endoscopy;   Laterality: N/A;  . EUS N/A 03/12/2016   Procedure: FULL UPPER ENDOSCOPIC ULTRASOUND (EUS) RADIAL;  Surgeon: Holly Bodily, MD;  Location: [facility] ENDOSCOPY;  Service: Endoscopy;  Laterality: N/A;  . GALLBLADDER SURGERY    . PANCREAS SURGERY  01/24/2016   Dr Burt Knack  . pap smear  2014  . REPAIR OF PERFORATED ULCER N/A 01/24/2016   Procedure: Exploratory Laparotomy with biopsy of Pancreas;  Surgeon: Florene Glen, MD;  Location: [facility] ORS;  Service: General;  Laterality: N/A;  . UPPER GI ENDOSCOPY  03/12/2016   Dr Mont Dutton   Family History  Problem Relation Age of Onset  . Pneumonia Mother   . Depression Mother   . Depression Sister   . Breast cancer Sister 43  . Diabetes Sister   . Stroke Brother   . Heart disease Brother    Social History   Socioeconomic History  . Marital status: Widowed    Spouse name: Not on file  . Number of children: Not on file  . Years  of education: Not on file  . Highest education level: Not on file  Occupational History  . Occupation: retired  Tobacco Use  . Smoking status: Former Smoker    Packs/day: 0.50    Years: 10.00    Pack years: 5.00    Types: Cigarettes    Quit date: 1988    Years since quitting: 33.8  . Smokeless tobacco: Never Used  . Tobacco comment: quit 1988  Vaping Use  . Vaping Use: Never used  Substance and Sexual Activity  . Alcohol use: Yes    Alcohol/week: 0.0 standard drinks    Comment: wine twice a month   . Drug use: No  . Sexual activity: Not Currently  Other Topics Concern  . Not on file  Social History Narrative  . Not on file   Social Determinants of Health   Financial Resource Strain: Low Risk   . Difficulty of Paying Living Expenses: Not hard at all  Food Insecurity: No Food Insecurity  . Worried About Charity fundraiser in the Last Year: Never true  . Ran Out of Food in the Last Year: Never true  Transportation Needs: No Transportation Needs  . Lack of Transportation (Medical): No  . Lack of  Transportation (Non-Medical): No  Physical Activity: Insufficiently Active  . Days of Exercise per Week: 3 days  . Minutes of Exercise per Session: 10 min  Stress: No Stress Concern Present  . Feeling of Stress : Not at all  Social Connections:   . Frequency of Communication with Friends and Family: Not on file  . Frequency of Social Gatherings with Friends and Family: Not on file  . Attends Religious Services: Not on file  . Active Member of Clubs or Organizations: Not on file  . Attends Archivist Meetings: Not on file  . Marital Status: Not on file    Tobacco Counseling Counseling given: Not Answered Comment: quit 1988   Clinical Intake:  Pre-visit preparation completed: Yes  Pain : No/denies pain     Nutritional Status: BMI 25 -29 Overweight Nutritional Risks: None Diabetes: No  How often do you need to have someone help you when you read instructions, pamphlets, or other written materials from your doctor or pharmacy?: 1 - Never What is the last grade level you completed in school?: 12th grade  Diabetic? no  Interpreter Needed?: No  Information entered by :: NAllen LPN   Activities of Daily Living In your present state of health, do you have any difficulty performing the following activities: 01/16/2020  Hearing? Y  Comment hearing aides  Vision? N  Difficulty concentrating or making decisions? N  Walking or climbing stairs? N  Dressing or bathing? N  Doing errands, shopping? N  Preparing Food and eating ? N  Using the Toilet? N  In the past six months, have you accidently leaked urine? N  Do you have problems with loss of bowel control? N  Managing your Medications? N  Managing your Finances? N  Housekeeping or managing your Housekeeping? N  Some recent data might be hidden    Patient Care Team: Olin Hauser, DO as PCP - General (Family Medicine) Luan Pulling Ronelle Nigh., MD (Inactive) (Family Medicine) Christene Lye, MD  (General Surgery)  Indicate any recent Medical Services you may have received from other than [facility] providers in the past year (date may be approximate).     Assessment:   This is a routine wellness examination for [place].  Hearing/Vision  screen  Hearing Screening   125Hz  250Hz  500Hz  1000Hz  2000Hz  3000Hz  4000Hz  6000Hz  8000Hz   Right ear:           Left ear:           Vision Screening Comments: Regular eye exams, Dr. Marvel Plan  Dietary issues and exercise activities discussed: Current Exercise Habits: Home exercise routine, Type of exercise: walking, Time (Minutes): 10, Frequency (Times/Week): 3, Weekly Exercise (Minutes/Week): 30  Goals    . Patient Stated     01/16/2020, stay well      Depression Screen PHQ 2/9 Scores 01/16/2020 04/18/2019 01/03/2019 10/12/2018 06/14/2018 05/06/2018 04/13/2018  PHQ - 2 Score 0 0 0 0 0 0 0  PHQ- 9 Score - - - - - - -    Fall Risk Fall Risk  01/16/2020 04/18/2019 01/03/2019 10/12/2018 06/14/2018  Falls in the past year? 0 0 0 0 0  Number falls in past yr: - 0 0 - -  Injury with Fall? - 0 0 - -  Risk for fall due to : Medication side effect - - - -  Follow up Falls evaluation completed;Education provided;Falls prevention discussed - - Falls evaluation completed Falls evaluation completed    Any stairs in or around the home? Yes  If so, are there any without handrails? No  Home free of loose throw rugs in walkways, pet beds, electrical cords, etc? Yes  Adequate lighting in your home to reduce risk of falls? Yes   ASSISTIVE DEVICES UTILIZED TO PREVENT FALLS:  Life alert? Yes  Use of a cane, walker or w/c? No  Grab bars in the bathroom? Yes  Shower chair or bench in shower? Yes  Elevated toilet seat or a handicapped toilet? No   TIMED UP AND GO:  Was the test performed? No .     Cognitive Function:     6CIT Screen 01/16/2020 01/05/2018  What Year? 0 points 0 points  What month? 0 points 0 points  What time? 0 points 0 points  Count back  from 20 0 points 0 points  Months in reverse 0 points 0 points  Repeat phrase 0 points 4 points  Total Score 0 4    Immunizations Immunization History  Administered Date(s) Administered  . Fluad Quad(high Dose 65+) 12/05/2018  . Influenza, High Dose Seasonal PF 11/28/2014, 12/05/2015, 12/08/2017  . Influenza-Unspecified 12/04/2016, 12/15/2019  . PFIZER SARS-COV-2 Vaccination 04/22/2019, 05/16/2019, 12/25/2019  . Pneumococcal Conjugate-13 01/09/2015  . Pneumococcal-Unspecified 11/28/2011  . Tdap 01/05/2018    TDAP status: Up to date Flu Vaccine status: Up to date Pneumococcal vaccine status: Up to date Covid-19 vaccine status: Completed vaccines  Qualifies for Shingles Vaccine? Yes   Zostavax completed No   Shingrix Completed?: No.    Education has been provided regarding the importance of this vaccine. Patient has been advised to call insurance company to determine out of pocket expense if they have not yet received this vaccine. Advised may also receive vaccine at local pharmacy or Health Dept. Verbalized acceptance and understanding.  Screening Tests Health Maintenance  Topic Date Due  . Hepatitis C Screening  Never done  . TETANUS/TDAP  01/06/2028  . INFLUENZA VACCINE  Completed  . DEXA SCAN  Completed  . COVID-19 Vaccine  Completed  . PNA vac Low Risk Adult  Completed    Health Maintenance  Health Maintenance Due  Topic Date Due  . Hepatitis C Screening  Never done    Colorectal cancer screening: No longer required.  Mammogram  status: Completed 08/14/2019. Repeat every year Bone Density status: Completed 10/19/2008.   Lung Cancer Screening: (Low Dose CT Chest recommended if Age 70-80 years, 30 pack-year currently smoking OR have quit w/in 15years.) does not qualify.   Lung Cancer Screening Referral: no  Additional Screening:  Hepatitis C Screening: does qualify; due  Vision Screening: Recommended annual ophthalmology exams for early detection of glaucoma and  other disorders of the eye. Is the patient up to date with their annual eye exam?  No  Who is the provider or what is the name of the office in which the patient attends annual eye exams? Dr. Marvel Plan If pt is not established with a provider, would they like to be referred to a provider to establish care? No .   Dental Screening: Recommended annual dental exams for proper oral hygiene  Community Resource Referral / Chronic Care Management: CRR required this visit?  No   CCM required this visit?  No      Plan:     I have personally reviewed and noted the following in the patient's chart:   . Medical and social history . Use of alcohol, tobacco or illicit drugs  . Current medications and supplements . Functional ability and status . Nutritional status . Physical activity . Advanced directives . List of other physicians . Hospitalizations, surgeries, and ER visits in previous 12 months . Vitals . Screenings to include cognitive, depression, and falls . Referrals and appointments  In addition, I have reviewed and discussed with patient certain preventive protocols, quality metrics, and best practice recommendations. A written personalized care plan for preventive services as well as general preventive health recommendations were provided to patient.     Kellie Simmering, LPN   30/10/2328   Nurse Notes:

## 2020-01-16 NOTE — Patient Instructions (Signed)
Elizabeth Bailey , Thank you for taking time to come for your Medicare Wellness Visit. I appreciate your ongoing commitment to your health goals. Please review the following plan we discussed and let me know if I can assist you in the future.   Screening recommendations/referrals: Colonoscopy: not required Mammogram:  08/14/2019, due 08/13/2020 Bone Density: completed 10/19/2008 Recommended yearly ophthalmology/optometry visit for glaucoma screening and checkup Recommended yearly dental visit for hygiene and checkup  Vaccinations: Influenza vaccine: completed 12/15/2019, due 09/30/2020 Pneumococcal vaccine: completed 01/09/2015 Tdap vaccine: completed 01/05/2018, due 01/06/2028 Shingles vaccine: discussed   Covid-19: 12/25/2019, 05/16/2019, 04/22/2019  Advanced directives: Please bring a copy of your POA (Power of Attorney) and/or Living Will to your next appointment.   Conditions/risks identified: none  Next appointment: Follow up in one year for your annual wellness visit    Preventive Care 65 Years and Older, Female Preventive care refers to lifestyle choices and visits with your health care provider that can promote health and wellness. What does preventive care include?  A yearly physical exam. This is also called an annual well check.  Dental exams once or twice a year.  Routine eye exams. Ask your health care provider how often you should have your eyes checked.  Personal lifestyle choices, including:  Daily care of your teeth and gums.  Regular physical activity.  Eating a healthy diet.  Avoiding tobacco and drug use.  Limiting alcohol use.  Practicing safe sex.  Taking low-dose aspirin every day.  Taking vitamin and mineral supplements as recommended by your health care provider. What happens during an annual well check? The services and screenings done by your health care provider during your annual well check will depend on your age, overall health, lifestyle risk  factors, and family history of disease. Counseling  Your health care provider may ask you questions about your:  Alcohol use.  Tobacco use.  Drug use.  Emotional well-being.  Home and relationship well-being.  Sexual activity.  Eating habits.  History of falls.  Memory and ability to understand (cognition).  Work and work Statistician.  Reproductive health. Screening  You may have the following tests or measurements:  Height, weight, and BMI.  Blood pressure.  Lipid and cholesterol levels. These may be checked every 5 years, or more frequently if you are over 5 years old.  Skin check.  Lung cancer screening. You may have this screening every year starting at age 103 if you have a 30-pack-year history of smoking and currently smoke or have quit within the past 15 years.  Fecal occult blood test (FOBT) of the stool. You may have this test every year starting at age 75.  Flexible sigmoidoscopy or colonoscopy. You may have a sigmoidoscopy every 5 years or a colonoscopy every 10 years starting at age 78.  Hepatitis C blood test.  Hepatitis B blood test.  Sexually transmitted disease (STD) testing.  Diabetes screening. This is done by checking your blood sugar (glucose) after you have not eaten for a while (fasting). You may have this done every 1-3 years.  Bone density scan. This is done to screen for osteoporosis. You may have this done starting at age 50.  Mammogram. This may be done every 1-2 years. Talk to your health care provider about how often you should have regular mammograms. Talk with your health care provider about your test results, treatment options, and if necessary, the need for more tests. Vaccines  Your health care provider may recommend certain vaccines, such as:  Influenza vaccine. This is recommended every year.  Tetanus, diphtheria, and acellular pertussis (Tdap, Td) vaccine. You may need a Td booster every 10 years.  Zoster vaccine. You  may need this after age 79.  Pneumococcal 13-valent conjugate (PCV13) vaccine. One dose is recommended after age 25.  Pneumococcal polysaccharide (PPSV23) vaccine. One dose is recommended after age 69. Talk to your health care provider about which screenings and vaccines you need and how often you need them. This information is not intended to replace advice given to you by your health care provider. Make sure you discuss any questions you have with your health care provider. Document Released: 03/15/2015 Document Revised: 11/06/2015 Document Reviewed: 12/18/2014 Elsevier Interactive Patient Education  2017 [place] Prevention in the Home Falls can cause injuries. They can happen to people of all ages. There are many things you can do to make your home safe and to help prevent falls. What can I do on the outside of my home?  Regularly fix the edges of walkways and driveways and fix any cracks.  Remove anything that might make you trip as you walk through a door, such as a raised step or threshold.  Trim any bushes or trees on the path to your home.  Use bright outdoor lighting.  Clear any walking paths of anything that might make someone trip, such as rocks or tools.  Regularly check to see if handrails are loose or broken. Make sure that both sides of any steps have handrails.  Any raised decks and porches should have guardrails on the edges.  Have any leaves, snow, or ice cleared regularly.  Use sand or salt on walking paths during winter.  Clean up any spills in your garage right away. This includes oil or grease spills. What can I do in the bathroom?  Use night lights.  Install grab bars by the toilet and in the tub and shower. Do not use towel bars as grab bars.  Use non-skid mats or decals in the tub or shower.  If you need to sit down in the shower, use a plastic, non-slip stool.  Keep the floor dry. Clean up any water that spills on the floor as soon as  it happens.  Remove soap buildup in the tub or shower regularly.  Attach bath mats securely with double-sided non-slip rug tape.  Do not have throw rugs and other things on the floor that can make you trip. What can I do in the bedroom?  Use night lights.  Make sure that you have a light by your bed that is easy to reach.  Do not use any sheets or blankets that are too big for your bed. They should not hang down onto the floor.  Have a firm chair that has side arms. You can use this for support while you get dressed.  Do not have throw rugs and other things on the floor that can make you trip. What can I do in the kitchen?  Clean up any spills right away.  Avoid walking on wet floors.  Keep items that you use a lot in easy-to-reach places.  If you need to reach something above you, use a strong step stool that has a grab bar.  Keep electrical cords out of the way.  Do not use floor polish or wax that makes floors slippery. If you must use wax, use non-skid floor wax.  Do not have throw rugs and other things on the floor that  can make you trip. What can I do with my stairs?  Do not leave any items on the stairs.  Make sure that there are handrails on both sides of the stairs and use them. Fix handrails that are broken or loose. Make sure that handrails are as long as the stairways.  Check any carpeting to make sure that it is firmly attached to the stairs. Fix any carpet that is loose or worn.  Avoid having throw rugs at the top or bottom of the stairs. If you do have throw rugs, attach them to the floor with carpet tape.  Make sure that you have a light switch at the top of the stairs and the bottom of the stairs. If you do not have them, ask someone to add them for you. What else can I do to help prevent falls?  Wear shoes that:  Do not have high heels.  Have rubber bottoms.  Are comfortable and fit you well.  Are closed at the toe. Do not wear sandals.  If  you use a stepladder:  Make sure that it is fully opened. Do not climb a closed stepladder.  Make sure that both sides of the stepladder are locked into place.  Ask someone to hold it for you, if possible.  Clearly mark and make sure that you can see:  Any grab bars or handrails.  First and last steps.  Where the edge of each step is.  Use tools that help you move around (mobility aids) if they are needed. These include:  Canes.  Walkers.  Scooters.  Crutches.  Turn on the lights when you go into a dark area. Replace any light bulbs as soon as they burn out.  Set up your furniture so you have a clear path. Avoid moving your furniture around.  If any of your floors are uneven, fix them.  If there are any pets around you, be aware of where they are.  Review your medicines with your doctor. Some medicines can make you feel dizzy. This can increase your chance of falling. Ask your doctor what other things that you can do to help prevent falls. This information is not intended to replace advice given to you by your health care provider. Make sure you discuss any questions you have with your health care provider. Document Released: 12/13/2008 Document Revised: 07/25/2015 Document Reviewed: 03/23/2014 Elsevier Interactive Patient Education  2017 [employer].

## 2020-01-16 NOTE — Telephone Encounter (Signed)
Requested medication (s) are due for refill today: yes  Requested medication (s) are on the active medication list: yes  Last refill:  12/18/19  Future visit scheduled: no  Notes to clinic:  med not delegated to NT to RF   Requested Prescriptions  Pending Prescriptions Disp Refills   zolpidem (AMBIEN) 5 MG tablet [Pharmacy Med Name: ZOLPIDEM TARTRATE 5 MG TAB] 30 tablet     Sig: TAKE 1 TABLET BY MOUTH AT BEDTIME AS NEEDED FOR SLEEP      Not Delegated - Psychiatry:  Anxiolytics/Hypnotics Failed - 01/16/2020  4:42 PM      Failed - This refill cannot be delegated      Failed - Urine Drug Screen completed in last 360 days      Passed - Valid encounter within last 6 months    Recent Outpatient Visits           3 months ago Acute vaginitis   [facility], Lupita Raider, FNP   6 months ago Acute vaginitis   [facility], Lupita Raider, FNP   9 months ago Yeast vaginitis   [place], DO   1 year ago Rheumatoid arthritis involving multiple sites with positive rheumatoid factor [facility])   [facility] Olin Hauser, DO   1 year ago Subacute vaginitis   [facility] [place], Devonne Doughty, DO       Future Appointments             In 1 year [facility], [facility]

## 2020-02-17 ENCOUNTER — Other Ambulatory Visit: Payer: Self-pay | Admitting: Family Medicine

## 2020-02-17 DIAGNOSIS — F5101 Primary insomnia: Secondary | ICD-10-CM

## 2020-02-17 NOTE — Telephone Encounter (Signed)
Requested medication (s) are due for refill today: yes  Requested medication (s) are on the active medication list: yes  Last refill:  01/17/20  Future visit scheduled: no  Notes to clinic:  med not delegated to NT to RF- called pt and LM on VM to call office to schedule appt.   Requested Prescriptions  Pending Prescriptions Disp Refills   zolpidem (AMBIEN) 5 MG tablet [Pharmacy Med Name: ZOLPIDEM TARTRATE 5 MG TAB] 30 tablet     Sig: TAKE 1 TABLET BY MOUTH AT BEDTIME AS NEEDED FOR SLEEP      Not Delegated - Psychiatry:  Anxiolytics/Hypnotics Failed - 02/17/2020  3:19 PM      Failed - This refill cannot be delegated      Failed - Urine Drug Screen completed in last 360 days      Passed - Valid encounter within last 6 months    Recent Outpatient Visits           4 months ago Acute vaginitis   [facility], Lupita Raider, FNP   7 months ago Acute vaginitis   [facility], Lupita Raider, FNP   10 months ago Yeast vaginitis   [facility], Devonne Doughty, DO   1 year ago Rheumatoid arthritis involving multiple sites with positive rheumatoid factor [facility])   [facility] Olin Hauser, DO   1 year ago Subacute vaginitis   [place], DO       Future Appointments             In 71 months [facility], [facility]

## 2020-04-17 ENCOUNTER — Other Ambulatory Visit: Payer: Self-pay | Admitting: Family Medicine

## 2020-04-17 DIAGNOSIS — F5101 Primary insomnia: Secondary | ICD-10-CM

## 2020-04-17 NOTE — Telephone Encounter (Signed)
Requested medication (s) are due for refill today: Yes  Requested medication (s) are on the active medication list: Yes  Last refill:  02/19/20  Future visit scheduled: Yes  Notes to clinic:  Unable to refill per protocol, cannot delegate.      Requested Prescriptions  Pending Prescriptions Disp Refills   zolpidem (AMBIEN) 5 MG tablet [Pharmacy Med Name: ZOLPIDEM TARTRATE 5 MG TAB] 30 tablet     Sig: TAKE 1 TABLET BY MOUTH AT BEDTIME AS NEEDED FOR SLEEP      Not Delegated - Psychiatry:  Anxiolytics/Hypnotics Failed - 04/17/2020  4:32 PM      Failed - This refill cannot be delegated      Failed - Urine Drug Screen completed in last 360 days      Failed - Valid encounter within last 6 months    Recent Outpatient Visits           6 months ago Acute vaginitis   [facility], Lupita Raider, FNP   9 months ago Acute vaginitis   [facility], Lupita Raider, FNP   1 year ago Yeast vaginitis   [place], DO   1 year ago Rheumatoid arthritis involving multiple sites with positive rheumatoid factor [facility])   [facility] Olin [facility]er, DO   1 year ago Subacute vaginitis   [place], DO       Future Appointments             In 9 months [facility], [facility]

## 2020-04-22 DIAGNOSIS — M5030 Other cervical disc degeneration, unspecified cervical region: Secondary | ICD-10-CM | POA: Diagnosis not present

## 2020-04-22 DIAGNOSIS — M5416 Radiculopathy, lumbar region: Secondary | ICD-10-CM | POA: Diagnosis not present

## 2020-04-22 DIAGNOSIS — M5136 Other intervertebral disc degeneration, lumbar region: Secondary | ICD-10-CM | POA: Diagnosis not present

## 2020-04-22 DIAGNOSIS — M5412 Radiculopathy, cervical region: Secondary | ICD-10-CM | POA: Diagnosis not present

## 2020-05-14 ENCOUNTER — Other Ambulatory Visit: Payer: Self-pay | Admitting: Family Medicine

## 2020-05-14 DIAGNOSIS — I10 Essential (primary) hypertension: Secondary | ICD-10-CM

## 2020-05-14 NOTE — Telephone Encounter (Signed)
Courtesy refill. Future visit in 8 months

## 2020-06-07 ENCOUNTER — Encounter: Payer: Self-pay | Admitting: Family Medicine

## 2020-06-07 ENCOUNTER — Other Ambulatory Visit: Payer: Self-pay

## 2020-06-07 ENCOUNTER — Ambulatory Visit (INDEPENDENT_AMBULATORY_CARE_PROVIDER_SITE_OTHER): Payer: Medicare PPO | Admitting: Family Medicine

## 2020-06-07 VITALS — BP 134/69 | HR 64 | Temp 97.3°F | Resp 22 | Ht 64.0 in | Wt 155.4 lb

## 2020-06-07 DIAGNOSIS — K219 Gastro-esophageal reflux disease without esophagitis: Secondary | ICD-10-CM

## 2020-06-07 DIAGNOSIS — M19042 Primary osteoarthritis, left hand: Secondary | ICD-10-CM | POA: Diagnosis not present

## 2020-06-07 DIAGNOSIS — M0579 Rheumatoid arthritis with rheumatoid factor of multiple sites without organ or systems involvement: Secondary | ICD-10-CM

## 2020-06-07 DIAGNOSIS — M5412 Radiculopathy, cervical region: Secondary | ICD-10-CM | POA: Diagnosis not present

## 2020-06-07 DIAGNOSIS — J432 Centrilobular emphysema: Secondary | ICD-10-CM

## 2020-06-07 DIAGNOSIS — M19041 Primary osteoarthritis, right hand: Secondary | ICD-10-CM | POA: Diagnosis not present

## 2020-06-07 MED ORDER — BREZTRI AEROSPHERE 160-9-4.8 MCG/ACT IN AERO: 2.0000 | INHALATION_SPRAY | Freq: Two times a day (BID) | RESPIRATORY_TRACT | 0 refills | Status: DC

## 2020-06-07 MED ORDER — OMEPRAZOLE 40 MG PO CPDR: 40.0000 mg | DELAYED_RELEASE_CAPSULE | Freq: Two times a day (BID) | ORAL | 1 refills | Status: DC

## 2020-06-07 MED ORDER — PREGABALIN 75 MG PO CAPS: 75.0000 mg | ORAL_CAPSULE | Freq: Two times a day (BID) | ORAL | 3 refills | Status: AC

## 2020-06-07 NOTE — Patient Instructions (Addendum)
Thank you for coming to the office today.  Increase stomach acid treatment Omeprazole 40mg  twice a day.  Referral to [facility] - Pulmonology.  They will discuss further about inhalers and oxygen.   Please schedule a Follow-up Appointment to: Return in about 4 months (around 10/07/2020) for 4 month follow-up GERD, COPD Pulm updates.  If you have any other questions or concerns, please feel free to call the office or send a message through [place]. You may also schedule an earlier appointment if necessary.  Additionally, you may be receiving a survey about your experience at our office within a few days to 1 week by e-mail or mail. We value your feedback.  [name], DO [place]

## 2020-06-07 NOTE — Progress Notes (Signed)
Subjective:    Patient ID: Elizabeth Bailey, female    DOB: [birth year], 80 y.o.   MRN: [id number]  Elizabeth Bailey is a 80 y.o. female presenting on 06/07/2020 for Gastroesophageal Reflux (Need refill on prilosec) and Shortness of Breath (Pt complains of chronic SOB with exertion. She state when she exercise she experiences SOB. She was put on oxygen when she was in the hospital, but she discontinued when she came home. She complains of SOB when she exercise and when she wears a mask. )   HPI   Rheumatoid Arthritis OA/DJD Followed by [facility] Dr Elizabeth Bailey Due Refill Lyrica  GERD Uncontrolled on Omeprazole 40mg  daily  Centrilobular Emphysema COPD / Chronic Dyspnea Reports that she had seen Pulmonology at [facility] in past. She was on oxygen temporarily in past but no longer interested. On Symbicort. Seems less benefit now  CHRONIC HTN: Doing well on Amlodipine 5mg  daily  Insomnia Chronic problem Improved on Zolpidem  History of Breast Cancer out of [facility]ara, return to Dr Elizabeth Bailey as scheduled.    Depression screen [facility] 2/9 01/16/2020 04/18/2019 01/03/2019  Decreased Interest 0 0 0  Down, Depressed, Hopeless 0 0 0  PHQ - 2 Score 0 0 0  Altered sleeping - - -  Tired, decreased energy - - -  Change in appetite - - -  Feeling bad or failure about yourself  - - -  Trouble concentrating - - -  Moving slowly or fidgety/restless - - -  Suicidal thoughts - - -  PHQ-9 Score - - -  Difficult doing work/chores - - -    Social History   Tobacco Use  . Smoking status: Former Smoker    Packs/day: 0.50    Years: 10.00    Pack years: 5.00    Types: Cigarettes    Quit date: 1988    Years since quitting: 34.2  . Smokeless tobacco: Never Used  . Tobacco comment: quit 1988  Vaping Use  . Vaping Use: Never used  Substance Use Topics  . Alcohol use: Yes    Alcohol/week: 0.0 standard drinks    Comment: wine twice a month   . Drug use: No    Review of Systems Per HPI unless  specifically indicated above     Objective:    BP 134/69 (BP Location: Left Arm, Patient Position: Sitting, Cuff Size: Normal)   Pulse 64   Temp (!) 97.3 F (36.3 C) (Temporal)   Resp (!) 22   Ht 5\' 4"  (1.626 m)   Wt 155 lb 6.4 oz (70.5 kg)   SpO2 98%   BMI 26.67 kg/m   Wt Readings from Last 3 Encounters:  06/07/20 155 lb 6.4 oz (70.5 kg)  01/16/20 168 lb (76.2 kg)  09/28/19 171 lb 9.6 oz (77.8 kg)    Physical Exam Vitals and nursing note reviewed.  Constitutional:      General: She is not in acute distress.    Appearance: She is well-developed. She is not diaphoretic.     Comments: Well-appearing, comfortable, cooperative  HENT:     Head: Normocephalic and atraumatic.  Eyes:     General:        Right eye: No discharge.        Left eye: No discharge.     Conjunctiva/sclera: Conjunctivae normal.  Neck:     Thyroid: No thyromegaly.  Cardiovascular:     Rate and Rhythm: Normal rate and regular rhythm.     Heart sounds: Normal  heart sounds. No murmur heard.   Pulmonary:     Effort: No respiratory distress.     Breath sounds: Normal breath sounds. No wheezing or rales.     Comments: Reduced air movement general, mild. Does have mild inc work of breathing. Speaks full sentences. Musculoskeletal:        General: Normal range of motion.     Cervical back: Normal range of motion and neck supple.  Lymphadenopathy:     Cervical: No cervical adenopathy.  Skin:    General: Skin is warm and dry.     Findings: No erythema or rash.  Neurological:     Mental Status: She is alert and oriented to person, place, and time.  Psychiatric:        Behavior: Behavior normal.     Comments: Well groomed, good eye contact, normal speech and thoughts    Results for orders placed or performed in visit on 09/28/19  Cervicovaginal ancillary only  Result Value Ref Range   Bacterial Vaginitis (gardnerella) Negative    Candida Vaginitis Positive (A)    Candida Glabrata Negative     Comment      Normal Reference Range Bacterial Vaginosis - Negative   Comment Normal Reference Range Candida Species - Negative    Comment Normal Reference Range Candida Galbrata - Negative       Assessment & Plan:   Problem List Items Addressed This Visit    Rheumatoid arthritis involving multiple sites with positive rheumatoid factor (Elizabeth Bailey)   Gastroesophageal reflux disease without esophagitis   Relevant Medications   omeprazole (PRILOSEC) 40 MG capsule   Centrilobular emphysema ([name]) - Primary   Relevant Orders   Ambulatory referral to Pulmonology     GERD Uncontrolled  Increase stomach acid treatment Omeprazole 40mg  twice a day.  RA [facility] w/ specialists at [facility] Rheum/Physiatry Refill Lyrica  Centrilobular Emphysema Dyspnea Worsening now referral to Pulmonology locally for COPD management, already on maintenance with symbicort, has chronic dyspnea on exertion, in past >2 years she was hospitalized for GI surgery with fistula and bowel repair, she was placed on home oxygen from [facility] temporarily, has remained off supplemental oxygen, but now having inc dyspnea with exertion.   Stop Symbicort Trial sample Breztri 2 puff BID 1 week, can order more if preferred  Orders Placed This Encounter  Procedures  . Ambulatory referral to Pulmonology    Referral Priority:   Routine    Referral Type:   Consultation    Referral Reason:   Specialty Services Required    Requested Specialty:   Pulmonary Disease    Number of Visits Requested:   1     Meds ordered this encounter  Medications  . omeprazole (PRILOSEC) 40 MG capsule    Sig: Take 1 capsule (40 mg total) by mouth in the morning and at bedtime.    Dispense:  180 capsule    Refill:  1      Follow up plan: Return in about 4 months (around 10/07/2020) for 4 month follow-up GERD, COPD Pulm updates.    [name], [facility] 06/07/2020, 9:04 AM

## 2020-06-15 ENCOUNTER — Other Ambulatory Visit: Payer: Self-pay | Admitting: Family Medicine

## 2020-06-15 DIAGNOSIS — J302 Other seasonal allergic rhinitis: Secondary | ICD-10-CM

## 2020-06-15 DIAGNOSIS — F5101 Primary insomnia: Secondary | ICD-10-CM

## 2020-06-15 NOTE — Telephone Encounter (Signed)
Requested medication (s) are due for refill today: yes  Requested medication (s) are on the active medication list: yes  Last refill:  04/28/20  Future visit scheduled: yes  Notes to clinic:  Med not delegated to NT to RF   Requested Prescriptions  Pending Prescriptions Disp Refills   zolpidem (AMBIEN) 5 MG tablet [Pharmacy Med Name: ZOLPIDEM TARTRATE 5 MG TAB] 30 tablet     Sig: TAKE 1 TABLET BY MOUTH AT BEDTIME AS NEEDED FOR SLEEP      Not Delegated - Psychiatry:  Anxiolytics/Hypnotics Failed - 06/15/2020  4:29 PM      Failed - This refill cannot be delegated      Failed - Urine Drug Screen completed in last 360 days      Passed - Valid encounter within last 6 months    Recent Outpatient Visits           1 week ago Centrilobular emphysema ([place])   [facility] Olin Hauser, DO   8 months ago Acute vaginitis   [facility], Lupita Raider, FNP   11 months ago Acute vaginitis   [facility], Lupita Raider, FNP   1 year ago Yeast vaginitis   [place], DO   1 year ago Rheumatoid arthritis involving multiple sites with positive rheumatoid factor ([place])   [facility] [place], Devonne Doughty, DO       Future Appointments             In 3 months [place], Devonne Doughty, DO [facility], [place]   In 7 months  [facility]              Signed Prescriptions Disp Refills   montelukast (SINGULAIR) 10 MG tablet 90 tablet 1    Sig: TAKE 1 TABLET BY MOUTH ONCE DAILY      Pulmonology:  Leukotriene Inhibitors Passed - 06/15/2020  4:29 PM      Passed - Valid encounter within last 12 months    Recent Outpatient Visits           1 week ago Centrilobular emphysema ([place])   [facility], Devonne Doughty, DO   8 months ago Acute vaginitis   [facility], Lupita Raider, FNP   11 months ago  Acute vaginitis   [facility], Lupita Raider, FNP   1 year ago Yeast vaginitis   [place], DO   1 year ago Rheumatoid arthritis involving multiple sites with positive rheumatoid factor [facility])   [facility] [place], Devonne Doughty, DO       Future Appointments             In 3 months [place], Devonne Doughty, DO [facility] [place]   In 7 months  [facility], [facility]

## 2020-06-15 NOTE — Telephone Encounter (Signed)
Requested Prescriptions  Pending Prescriptions Disp Refills  . zolpidem (AMBIEN) 5 MG tablet [Pharmacy Med Name: ZOLPIDEM TARTRATE 5 MG TAB] 30 tablet     Sig: TAKE 1 TABLET BY MOUTH AT BEDTIME AS NEEDED FOR SLEEP     Not Delegated - Psychiatry:  Anxiolytics/Hypnotics Failed - 06/15/2020  4:29 PM      Failed - This refill cannot be delegated      Failed - Urine Drug Screen completed in last 360 days      Passed - Valid encounter within last 6 months    Recent Outpatient Visits          1 week ago Centrilobular emphysema ([place])   [facility], Devonne Doughty, DO   8 months ago Acute vaginitis   [facility], Lupita Raider, FNP   11 months ago Acute vaginitis   [facility], Lupita Raider, FNP   1 year ago Yeast vaginitis   [place], DO   1 year ago Rheumatoid arthritis involving multiple sites with positive rheumatoid factor ([place])   [place], DO      Future Appointments            In 3 months Parks Ranger, Devonne Doughty, DO [facility], [place]   In 7 months  [facility]           . montelukast (SINGULAIR) 10 MG tablet [Pharmacy Med Name: MONTELUKAST SODIUM 10 MG TAB] 90 tablet 1    Sig: TAKE 1 TABLET BY MOUTH ONCE DAILY     Pulmonology:  Leukotriene Inhibitors Passed - 06/15/2020  4:29 PM      Passed - Valid encounter within last 12 months    Recent Outpatient Visits          1 week ago Centrilobular emphysema ([place])   [facility] [place], Devonne Doughty, DO   8 months ago Acute vaginitis   [facility], Lupita Raider, [place]   11 months ago Acute vaginitis   [facility], Lupita Raider, FNP   1 year ago Yeast vaginitis   [place], DO   1 year ago Rheumatoid arthritis involving multiple sites with positive  rheumatoid factor [facility])   [facility] [place], Devonne Doughty, DO      Future Appointments            In 3 months [place], Devonne Doughty, DO [facility], [place]   In 7 months  [facility], [facility]

## 2020-06-19 DIAGNOSIS — J449 Chronic obstructive pulmonary disease, unspecified: Secondary | ICD-10-CM | POA: Diagnosis not present

## 2020-06-25 DIAGNOSIS — M0579 Rheumatoid arthritis with rheumatoid factor of multiple sites without organ or systems involvement: Secondary | ICD-10-CM | POA: Diagnosis not present

## 2020-06-25 DIAGNOSIS — J449 Chronic obstructive pulmonary disease, unspecified: Secondary | ICD-10-CM | POA: Diagnosis not present

## 2020-07-08 DIAGNOSIS — Z01818 Encounter for other preprocedural examination: Secondary | ICD-10-CM | POA: Diagnosis not present

## 2020-07-08 DIAGNOSIS — J449 Chronic obstructive pulmonary disease, unspecified: Secondary | ICD-10-CM | POA: Diagnosis not present

## 2020-07-08 DIAGNOSIS — R911 Solitary pulmonary nodule: Secondary | ICD-10-CM | POA: Diagnosis not present

## 2020-07-12 ENCOUNTER — Other Ambulatory Visit: Payer: Self-pay | Admitting: Pulmonary Disease

## 2020-07-12 ENCOUNTER — Other Ambulatory Visit (HOSPITAL_COMMUNITY): Payer: Self-pay | Admitting: Pulmonary Disease

## 2020-07-12 DIAGNOSIS — J449 Chronic obstructive pulmonary disease, unspecified: Secondary | ICD-10-CM

## 2020-07-25 ENCOUNTER — Other Ambulatory Visit: Payer: Self-pay

## 2020-07-25 ENCOUNTER — Ambulatory Visit
Admission: RE | Admit: 2020-07-25 | Discharge: 2020-07-25 | Disposition: A | Discharge status: Home / Self Care | Payer: Medicare PPO | Source: Ambulatory Visit | Attending: Pulmonary Disease | Admitting: Pulmonary Disease

## 2020-07-25 DIAGNOSIS — I2510 Atherosclerotic heart disease of native coronary artery without angina pectoris: Secondary | ICD-10-CM | POA: Diagnosis not present

## 2020-07-25 DIAGNOSIS — J449 Chronic obstructive pulmonary disease, unspecified: Secondary | ICD-10-CM | POA: Insufficient documentation

## 2020-07-25 DIAGNOSIS — J439 Emphysema, unspecified: Secondary | ICD-10-CM | POA: Diagnosis not present

## 2020-07-25 DIAGNOSIS — D171 Benign lipomatous neoplasm of skin and subcutaneous tissue of trunk: Secondary | ICD-10-CM | POA: Diagnosis not present

## 2020-07-25 DIAGNOSIS — Z853 Personal history of malignant neoplasm of breast: Secondary | ICD-10-CM | POA: Diagnosis not present

## 2020-08-20 DIAGNOSIS — M5136 Other intervertebral disc degeneration, lumbar region: Secondary | ICD-10-CM | POA: Diagnosis not present

## 2020-08-20 DIAGNOSIS — M5416 Radiculopathy, lumbar region: Secondary | ICD-10-CM | POA: Diagnosis not present

## 2020-08-20 DIAGNOSIS — M5030 Other cervical disc degeneration, unspecified cervical region: Secondary | ICD-10-CM | POA: Diagnosis not present

## 2020-08-20 DIAGNOSIS — M5412 Radiculopathy, cervical region: Secondary | ICD-10-CM | POA: Diagnosis not present

## 2020-09-05 ENCOUNTER — Other Ambulatory Visit: Payer: Self-pay | Admitting: Family Medicine

## 2020-09-05 DIAGNOSIS — Z1231 Encounter for screening mammogram for malignant neoplasm of breast: Secondary | ICD-10-CM

## 2020-09-06 ENCOUNTER — Other Ambulatory Visit: Payer: Self-pay | Admitting: Family Medicine

## 2020-09-06 DIAGNOSIS — K219 Gastro-esophageal reflux disease without esophagitis: Secondary | ICD-10-CM

## 2020-09-06 DIAGNOSIS — J302 Other seasonal allergic rhinitis: Secondary | ICD-10-CM

## 2020-09-13 ENCOUNTER — Other Ambulatory Visit: Payer: Self-pay

## 2020-09-13 ENCOUNTER — Ambulatory Visit
Admission: RE | Admit: 2020-09-13 | Discharge: 2020-09-13 | Disposition: A | Discharge status: Home / Self Care | Payer: Medicare PPO | Source: Ambulatory Visit | Attending: Family Medicine | Admitting: Family Medicine

## 2020-09-13 DIAGNOSIS — Z1231 Encounter for screening mammogram for malignant neoplasm of breast: Secondary | ICD-10-CM | POA: Insufficient documentation

## 2020-10-07 DIAGNOSIS — M25562 Pain in left knee: Secondary | ICD-10-CM | POA: Diagnosis not present

## 2020-10-07 DIAGNOSIS — M0579 Rheumatoid arthritis with rheumatoid factor of multiple sites without organ or systems involvement: Secondary | ICD-10-CM | POA: Diagnosis not present

## 2020-10-07 DIAGNOSIS — J449 Chronic obstructive pulmonary disease, unspecified: Secondary | ICD-10-CM | POA: Diagnosis not present

## 2020-10-07 DIAGNOSIS — G8929 Other chronic pain: Secondary | ICD-10-CM | POA: Diagnosis not present

## 2020-10-08 ENCOUNTER — Other Ambulatory Visit: Payer: Self-pay

## 2020-10-08 ENCOUNTER — Encounter: Payer: Self-pay | Admitting: Family Medicine

## 2020-10-08 ENCOUNTER — Ambulatory Visit: Payer: Medicare PPO | Admitting: Family Medicine

## 2020-10-08 VITALS — BP 137/61 | HR 66 | Ht 64.0 in | Wt 157.4 lb

## 2020-10-08 DIAGNOSIS — J432 Centrilobular emphysema: Secondary | ICD-10-CM

## 2020-10-08 DIAGNOSIS — J449 Chronic obstructive pulmonary disease, unspecified: Secondary | ICD-10-CM | POA: Diagnosis not present

## 2020-10-08 DIAGNOSIS — I10 Essential (primary) hypertension: Secondary | ICD-10-CM

## 2020-10-08 DIAGNOSIS — M0579 Rheumatoid arthritis with rheumatoid factor of multiple sites without organ or systems involvement: Secondary | ICD-10-CM | POA: Diagnosis not present

## 2020-10-08 NOTE — Progress Notes (Signed)
Subjective:    Patient ID: Elizabeth Bailey, female    DOB: 1940/12/20, 80 y.o.   MRN: [id number]  Elizabeth Bailey is a 80 y.o. 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 - Interval update with established with Pulm and did PFTs - Today patient reports she is maintaning with breathing, has had CT as well recently, followed with Pulm Nodule. She remains off Breztri sample, and is on the Symbicort and Albuterol PRN. - SHe does not qualify for home oxygen at this time and she is happy about that - Taking Symbicort - Admits dyspnea most days some days better than others.  Rheumatoid Arthritis OA/DJD Followed by [facility] Dr [place] on Lyrica   GERD On PPI Omeprazole  CHRONIC HTN: Doing well on Amlodipine '5mg'$  daily   Insomnia Chronic problem Improved on Zolpidem  Depression screen [facility] 2/9 01/16/2020 04/18/2019 01/03/2019  Decreased Interest 0 0 0  Down, Depressed, Hopeless 0 0 0  PHQ - 2 Score 0 0 0  Altered sleeping - - -  Tired, decreased energy - - -  Change in appetite - - -  Feeling bad or failure about yourself  - - -  Trouble concentrating - - -  Moving slowly or fidgety/restless - - -  Suicidal thoughts - - -  PHQ-9 Score - - -  Difficult doing work/chores - - -    Social History   Tobacco Use   Smoking status: Former    Packs/day: 0.50    Years: 10.00    Pack years: 5.00    Types: Cigarettes    Quit date: 1988    Years since quitting: 34.6   Smokeless tobacco: Never   Tobacco comments:    quit 1988  Vaping Use   Vaping Use: Never used  Substance Use Topics   Alcohol use: Yes    Alcohol/week: 0.0 standard drinks    Comment: wine twice a month    Drug use: No    Review of Systems Per HPI unless  specifically indicated above     Objective:    BP 137/61   Pulse 66   Ht '5\' 4"'$  (1.626 m)   Wt 157 lb 6.4 oz (71.4 kg)   SpO2 95%   BMI 27.02 kg/m   Wt Readings from Last 3 Encounters:  10/08/20 157 lb 6.4 oz (71.4 kg)  06/07/20 155 lb 6.4 oz (70.5 kg)  01/16/20 168 lb (76.2 kg)    Physical Exam  I have personally reviewed the radiology report from 08/28/20 on CT.  CT Impression Results below, please advise for report to be called to patient....  IMPRESSION:  1. Bilateral pulmonary nodules are unchanged or decreased in size  from prior exam, and majority are stable from 2017, consistent with  benign etiology. Right upper lobe nodule has diminished in size and  has surrounding linear density, consistent with post infectious or  inflammatory scarring.  2. No new or progressive nodule.  3. Moderate emphysema.  4. Aortic atherosclerosis.  Coronary artery calcifications.   Aortic Atherosclerosis (ICD10-I70.0) and Emphysema (ICD10-J43.9).  Results for orders placed or performed in visit on 09/28/19  Cervicovaginal ancillary only  Result Value Ref Range   Bacterial Vaginitis (gardnerella) Negative    Candida Vaginitis  Positive (A)    Candida Glabrata Negative    Comment      Normal Reference Range Bacterial Vaginosis - Negative   Comment Normal Reference Range Candida Species - Negative    Comment Normal Reference Range Candida Galbrata - Negative       Assessment & Plan:   Problem List Items Addressed This Visit     Rheumatoid arthritis involving multiple sites with positive rheumatoid factor ([place])   Centrilobular emphysema ([place]) - Primary   Other Visit Diagnoses     Asthma-COPD overlap syndrome ([place])       Essential hypertension           Asthma-COPD Overlap Emphysema Completed PFTs / CT imaging, has pulm nodule follow up surveillance Followed by [facility] On Symbicort, Albuterol PRN No oxygen requirement Stable today  Rheumatoid  Arthritis Stable Controlled on Lyrica Followed by [facility] Rheumatology  HTN Controlled on Amlodipine.   No orders of the defined types were placed in this encounter.     Follow up plan: Return in about 6 months (around 04/10/2021) for 6 month Yearly Medicare Checkup, AM apt fasting labs AFTER.   [name], [facility] 10/08/2020, 9:00 AM

## 2020-10-08 NOTE — Patient Instructions (Addendum)
Thank you for coming to the office today.  Call meds into pharmacy to refill when ready.  Keep up the good work.  DUE for FASTING BLOOD WORK (no food or drink after midnight before the lab appointment, only water or coffee without cream/sugar on the morning of)  SCHEDULE "Lab Only" visit in the morning at the clinic for lab draw in 6 MONTHS   - Make sure Lab Only appointment is at about 1 week before your next appointment, so that results will be available  For Lab Results, once available within 2-3 days of blood draw, you can can log in to MyChart online to view your results and a brief explanation. Also, we can discuss results at next follow-up visit.   Please schedule a Follow-up Appointment to: Return in about 6 months (around 04/10/2021) for 6 month Yearly Medicare Checkup, AM apt fasting labs AFTER.  If you have any other questions or concerns, please feel free to call the office or send a message through [place]. You may also schedule an earlier appointment if necessary.  Additionally, you may be receiving a survey about your experience at our office within a few days to 1 week by e-mail or mail. We value your feedback.  Elizabeth Putnam, DO [place]

## 2020-10-16 DIAGNOSIS — J4540 Moderate persistent asthma, uncomplicated: Secondary | ICD-10-CM | POA: Diagnosis not present

## 2020-10-17 ENCOUNTER — Other Ambulatory Visit: Payer: Self-pay | Admitting: Family Medicine

## 2020-10-17 DIAGNOSIS — F5101 Primary insomnia: Secondary | ICD-10-CM

## 2020-10-17 NOTE — Telephone Encounter (Signed)
Requested medication (s) are due for refill today:  yes   Requested medication (s) are on the active medication list:  yes   Last refill:  09/17/2020  Future visit scheduled: yes s  Notes to clinic:  this refill cannot be delegated    Requested Prescriptions  Pending Prescriptions Disp Refills   zolpidem (AMBIEN) 5 MG tablet [Pharmacy Med Name: ZOLPIDEM TARTRATE 5 MG TAB] 30 tablet     Sig: TAKE 1 TABLET BY MOUTH AT BEDTIME AS NEEDED FOR SLEEP     Not Delegated - Psychiatry:  Anxiolytics/Hypnotics Failed - 10/17/2020  8:39 AM      Failed - This refill cannot be delegated      Failed - Urine Drug Screen completed in last 360 days      Passed - Valid encounter within last 6 months    Recent Outpatient Visits           1 week ago Centrilobular emphysema ([place])   [place], DO   4 months ago Centrilobular emphysema [facility])   [facility] Olin Hauser, DO   1 year ago Acute vaginitis   [place], FNP   1 year ago Acute vaginitis   [facility], Lupita Raider, FNP   1 year ago Yeast vaginitis   [place], Devonne Doughty, DO       Future Appointments             In 3 months  [facility], [place]   In 5 months [place], [facility], [facility]

## 2020-12-14 ENCOUNTER — Other Ambulatory Visit: Payer: Self-pay | Admitting: Family Medicine

## 2020-12-14 DIAGNOSIS — F5101 Primary insomnia: Secondary | ICD-10-CM

## 2020-12-14 NOTE — Telephone Encounter (Signed)
Requested medication (s) are due for refill today: no  Requested medication (s) are on the active medication list: yes  Last refill:  10/17/20 #30 2 RF  Future visit scheduled: yes  Notes to clinic:  not delegated to NT to RF   Requested Prescriptions  Pending Prescriptions Disp Refills   zolpidem (AMBIEN) 5 MG tablet [Pharmacy Med Name: ZOLPIDEM TARTRATE 5 MG TAB] 30 tablet     Sig: TAKE 1 TABLET BY MOUTH AT BEDTIME AS NEEDED FOR SLEEP     Not Delegated - Psychiatry:  Anxiolytics/Hypnotics Failed - 12/14/2020 11:37 AM      Failed - This refill cannot be delegated      Failed - Urine Drug Screen completed in last 360 days      Passed - Valid encounter within last 6 months    Recent Outpatient Visits           2 months ago Centrilobular emphysema ([place])   [place], DO   6 months ago Centrilobular emphysema [facility])   [facility] Olin Hauser, DO   1 year ago Acute vaginitis   [place], FNP   1 year ago Acute vaginitis   [name], FNP   1 year ago Yeast vaginitis   [facility] Parks Ranger, Devonne Doughty, DO       Future Appointments             In 1 month  [facility], [place]   In 3 months Parks [place], [facility], [facility]

## 2020-12-16 DIAGNOSIS — Z79899 Other long term (current) drug therapy: Secondary | ICD-10-CM | POA: Diagnosis not present

## 2020-12-16 DIAGNOSIS — S42475A Nondisplaced transcondylar fracture of left humerus, initial encounter for closed fracture: Secondary | ICD-10-CM | POA: Diagnosis not present

## 2020-12-16 DIAGNOSIS — S4992XA Unspecified injury of left shoulder and upper arm, initial encounter: Secondary | ICD-10-CM | POA: Diagnosis not present

## 2020-12-16 DIAGNOSIS — S42292A Other displaced fracture of upper end of left humerus, initial encounter for closed fracture: Secondary | ICD-10-CM | POA: Diagnosis not present

## 2020-12-16 DIAGNOSIS — M5412 Radiculopathy, cervical region: Secondary | ICD-10-CM | POA: Diagnosis not present

## 2020-12-17 ENCOUNTER — Other Ambulatory Visit: Payer: Self-pay

## 2020-12-17 ENCOUNTER — Observation Stay
Admission: EM | Admit: 2020-12-17 | Discharge: 2020-12-23 | Disposition: A | Discharge status: Home / Self Care | Payer: Medicare PPO | Attending: Internal Medicine | Admitting: Internal Medicine

## 2020-12-17 ENCOUNTER — Encounter: Payer: Self-pay | Admitting: Internal Medicine

## 2020-12-17 ENCOUNTER — Emergency Department: Payer: Medicare PPO

## 2020-12-17 DIAGNOSIS — Z20822 Contact with and (suspected) exposure to covid-19: Secondary | ICD-10-CM | POA: Diagnosis not present

## 2020-12-17 DIAGNOSIS — N390 Urinary tract infection, site not specified: Secondary | ICD-10-CM

## 2020-12-17 DIAGNOSIS — R531 Weakness: Secondary | ICD-10-CM | POA: Diagnosis not present

## 2020-12-17 DIAGNOSIS — M4312 Spondylolisthesis, cervical region: Secondary | ICD-10-CM | POA: Diagnosis not present

## 2020-12-17 DIAGNOSIS — G9341 Metabolic encephalopathy: Secondary | ICD-10-CM | POA: Diagnosis not present

## 2020-12-17 DIAGNOSIS — M0579 Rheumatoid arthritis with rheumatoid factor of multiple sites without organ or systems involvement: Secondary | ICD-10-CM | POA: Diagnosis present

## 2020-12-17 DIAGNOSIS — M069 Rheumatoid arthritis, unspecified: Secondary | ICD-10-CM

## 2020-12-17 DIAGNOSIS — Z87891 Personal history of nicotine dependence: Secondary | ICD-10-CM | POA: Insufficient documentation

## 2020-12-17 DIAGNOSIS — R0602 Shortness of breath: Secondary | ICD-10-CM

## 2020-12-17 DIAGNOSIS — Y92410 Unspecified street and highway as the place of occurrence of the external cause: Secondary | ICD-10-CM | POA: Insufficient documentation

## 2020-12-17 DIAGNOSIS — S199XXA Unspecified injury of neck, initial encounter: Secondary | ICD-10-CM | POA: Diagnosis not present

## 2020-12-17 DIAGNOSIS — R4182 Altered mental status, unspecified: Secondary | ICD-10-CM | POA: Diagnosis not present

## 2020-12-17 DIAGNOSIS — Z79899 Other long term (current) drug therapy: Secondary | ICD-10-CM | POA: Diagnosis not present

## 2020-12-17 DIAGNOSIS — I129 Hypertensive chronic kidney disease with stage 1 through stage 4 chronic kidney disease, or unspecified chronic kidney disease: Secondary | ICD-10-CM | POA: Diagnosis not present

## 2020-12-17 DIAGNOSIS — S4992XA Unspecified injury of left shoulder and upper arm, initial encounter: Secondary | ICD-10-CM | POA: Diagnosis present

## 2020-12-17 DIAGNOSIS — I10 Essential (primary) hypertension: Secondary | ICD-10-CM

## 2020-12-17 DIAGNOSIS — N1831 Chronic kidney disease, stage 3a: Secondary | ICD-10-CM | POA: Diagnosis not present

## 2020-12-17 DIAGNOSIS — S42202A Unspecified fracture of upper end of left humerus, initial encounter for closed fracture: Secondary | ICD-10-CM | POA: Diagnosis not present

## 2020-12-17 DIAGNOSIS — J45909 Unspecified asthma, uncomplicated: Secondary | ICD-10-CM | POA: Diagnosis not present

## 2020-12-17 DIAGNOSIS — Z853 Personal history of malignant neoplasm of breast: Secondary | ICD-10-CM | POA: Diagnosis not present

## 2020-12-17 DIAGNOSIS — S42202S Unspecified fracture of upper end of left humerus, sequela: Secondary | ICD-10-CM

## 2020-12-17 DIAGNOSIS — J449 Chronic obstructive pulmonary disease, unspecified: Secondary | ICD-10-CM | POA: Diagnosis present

## 2020-12-17 DIAGNOSIS — R410 Disorientation, unspecified: Secondary | ICD-10-CM | POA: Diagnosis not present

## 2020-12-17 DIAGNOSIS — J439 Emphysema, unspecified: Secondary | ICD-10-CM | POA: Diagnosis not present

## 2020-12-17 DIAGNOSIS — M47812 Spondylosis without myelopathy or radiculopathy, cervical region: Secondary | ICD-10-CM | POA: Diagnosis not present

## 2020-12-17 LAB — URINALYSIS, COMPLETE (UACMP) WITH MICROSCOPIC
Bilirubin Urine: NEGATIVE
Glucose, UA: NEGATIVE mg/dL
Hgb urine dipstick: NEGATIVE
Ketones, ur: 5 mg/dL — AB
Leukocytes,Ua: NEGATIVE
Nitrite: NEGATIVE
Protein, ur: 30 mg/dL — AB
Specific Gravity, Urine: 1.039 — ABNORMAL HIGH (ref 1.005–1.030)
pH: 5 (ref 5.0–8.0)

## 2020-12-17 LAB — CBC
HCT: 41.8 % (ref 36.0–46.0)
Hemoglobin: 14.6 g/dL (ref 12.0–15.0)
MCH: 34.9 pg — ABNORMAL HIGH (ref 26.0–34.0)
MCHC: 34.9 g/dL (ref 30.0–36.0)
MCV: 100 fL (ref 80.0–100.0)
Platelets: 283 10*3/uL (ref 150–400)
RBC: 4.18 MIL/uL (ref 3.87–5.11)
RDW: 12.8 % (ref 11.5–15.5)
WBC: 8.1 10*3/uL (ref 4.0–10.5)
nRBC: 0 % (ref 0.0–0.2)

## 2020-12-17 LAB — COMPREHENSIVE METABOLIC PANEL
ALT: 15 U/L (ref 0–44)
AST: 25 U/L (ref 15–41)
Albumin: 4.2 g/dL (ref 3.5–5.0)
Alkaline Phosphatase: 67 U/L (ref 38–126)
Anion gap: 12 (ref 5–15)
BUN: 23 mg/dL (ref 8–23)
CO2: 24 mmol/L (ref 22–32)
Calcium: 9.6 mg/dL (ref 8.9–10.3)
Chloride: 98 mmol/L (ref 98–111)
Creatinine, Ser: 1.14 mg/dL — ABNORMAL HIGH (ref 0.44–1.00)
GFR, Estimated: 49 mL/min — ABNORMAL LOW (ref >60)
Glucose, Bld: 118 mg/dL — ABNORMAL HIGH (ref 70–99)
Potassium: 4.2 mmol/L (ref 3.5–5.1)
Sodium: 134 mmol/L — ABNORMAL LOW (ref 135–145)
Total Bilirubin: 1.7 mg/dL — ABNORMAL HIGH (ref 0.3–1.2)
Total Protein: 8.5 g/dL — ABNORMAL HIGH (ref 6.5–8.1)

## 2020-12-17 LAB — RESP PANEL BY RT-PCR (FLU A&B, COVID) ARPGX2
Influenza A by PCR: NEGATIVE
Influenza B by PCR: NEGATIVE
SARS Coronavirus 2 by RT PCR: NEGATIVE

## 2020-12-17 MED ORDER — PREGABALIN 75 MG PO CAPS
75.0000 mg | ORAL_CAPSULE | Freq: Two times a day (BID) | ORAL | Status: DC
  Administered 2020-12-17 – 2020-12-23 (×12): 75 mg via ORAL
  Filled 2020-12-17 (×12): qty 1

## 2020-12-17 MED ORDER — MOMETASONE FURO-FORMOTEROL FUM 200-5 MCG/ACT IN AERO
2.0000 | INHALATION_SPRAY | Freq: Two times a day (BID) | RESPIRATORY_TRACT | Status: DC
  Administered 2020-12-18 – 2020-12-23 (×11): 2 via RESPIRATORY_TRACT
  Filled 2020-12-17 (×2): qty 8.8

## 2020-12-17 MED ORDER — ENOXAPARIN SODIUM 40 MG/0.4ML IJ SOSY
40.0000 mg | PREFILLED_SYRINGE | INTRAMUSCULAR | Status: DC
  Administered 2020-12-17 – 2020-12-22 (×6): 40 mg via SUBCUTANEOUS
  Filled 2020-12-17 (×6): qty 0.4

## 2020-12-17 MED ORDER — ALBUTEROL SULFATE (2.5 MG/3ML) 0.083% IN NEBU
3.0000 mL | INHALATION_SOLUTION | Freq: Four times a day (QID) | RESPIRATORY_TRACT | Status: DC | PRN
  Administered 2020-12-19: 15:00:00 3 mL via RESPIRATORY_TRACT
  Filled 2020-12-17: qty 3

## 2020-12-17 MED ORDER — DICLOFENAC SODIUM 1 % EX GEL
4.0000 g | Freq: Four times a day (QID) | CUTANEOUS | Status: DC
  Administered 2020-12-17 – 2020-12-23 (×21): 4 g via TOPICAL
  Filled 2020-12-17 (×2): qty 100

## 2020-12-17 MED ORDER — LACTATED RINGERS IV BOLUS
1000.0000 mL | Freq: Once | INTRAVENOUS | Status: AC
  Administered 2020-12-17: 1000 mL via INTRAVENOUS

## 2020-12-17 MED ORDER — CALCIUM CARBONATE-VITAMIN D 500-200 MG-UNIT PO TABS
1.0000 | ORAL_TABLET | Freq: Two times a day (BID) | ORAL | Status: DC
  Administered 2020-12-17 (×2): 1 via ORAL
  Filled 2020-12-17 (×5): qty 1

## 2020-12-17 MED ORDER — ONDANSETRON HCL 4 MG/2ML IJ SOLN: 4.0000 mg | Freq: Four times a day (QID) | INTRAMUSCULAR | Status: DC | PRN

## 2020-12-17 MED ORDER — VITAMIN B-12 1000 MCG PO TABS
1000.0000 ug | ORAL_TABLET | Freq: Every day | ORAL | Status: DC
  Administered 2020-12-17 – 2020-12-23 (×7): 1000 ug via ORAL
  Filled 2020-12-17 (×7): qty 1

## 2020-12-17 MED ORDER — AMLODIPINE BESYLATE 5 MG PO TABS
5.0000 mg | ORAL_TABLET | Freq: Once | ORAL | Status: AC
  Administered 2020-12-17: 5 mg via ORAL
  Filled 2020-12-17: qty 1

## 2020-12-17 MED ORDER — OYSTER SHELL CALCIUM/D3 500-5 MG-MCG PO TABS
1.0000 | ORAL_TABLET | Freq: Two times a day (BID) | ORAL | Status: DC
  Administered 2020-12-18 – 2020-12-23 (×11): 1 via ORAL
  Filled 2020-12-17 (×11): qty 1

## 2020-12-17 MED ORDER — SIMVASTATIN 20 MG PO TABS
20.0000 mg | ORAL_TABLET | ORAL | Status: DC
  Administered 2020-12-17 – 2020-12-21 (×3): 20 mg via ORAL
  Filled 2020-12-17 (×5): qty 1

## 2020-12-17 MED ORDER — AMLODIPINE BESYLATE 5 MG PO TABS
5.0000 mg | ORAL_TABLET | Freq: Every day | ORAL | Status: DC
  Administered 2020-12-17 – 2020-12-18 (×2): 5 mg via ORAL
  Filled 2020-12-17 (×2): qty 1

## 2020-12-17 MED ORDER — HYDROCODONE-ACETAMINOPHEN 5-325 MG PO TABS
1.0000 | ORAL_TABLET | Freq: Once | ORAL | Status: AC
  Administered 2020-12-17: 1 via ORAL
  Filled 2020-12-17: qty 1

## 2020-12-17 MED ORDER — MELATONIN 5 MG PO TABS
10.0000 mg | ORAL_TABLET | Freq: Every day | ORAL | Status: DC
  Administered 2020-12-17 – 2020-12-22 (×6): 10 mg via ORAL
  Filled 2020-12-17 (×7): qty 2

## 2020-12-17 MED ORDER — ACETAMINOPHEN 325 MG PO TABS
650.0000 mg | ORAL_TABLET | Freq: Four times a day (QID) | ORAL | Status: DC | PRN
  Administered 2020-12-17 – 2020-12-22 (×5): 650 mg via ORAL
  Filled 2020-12-17 (×5): qty 2

## 2020-12-17 MED ORDER — MONTELUKAST SODIUM 10 MG PO TABS
10.0000 mg | ORAL_TABLET | Freq: Every day | ORAL | Status: DC
  Administered 2020-12-17 – 2020-12-22 (×6): 10 mg via ORAL
  Filled 2020-12-17 (×6): qty 1

## 2020-12-17 MED ORDER — OXYCODONE HCL 5 MG PO TABS
5.0000 mg | ORAL_TABLET | ORAL | Status: DC | PRN
  Administered 2020-12-17 – 2020-12-20 (×11): 5 mg via ORAL
  Filled 2020-12-17 (×11): qty 1

## 2020-12-17 MED ORDER — ACETAMINOPHEN 650 MG RE SUPP: 650.0000 mg | Freq: Four times a day (QID) | RECTAL | Status: DC | PRN

## 2020-12-17 MED ORDER — SODIUM CHLORIDE 0.9 % IV SOLN
1.0000 g | Freq: Once | INTRAVENOUS | Status: AC
  Administered 2020-12-17: 1 g via INTRAVENOUS
  Filled 2020-12-17: qty 10

## 2020-12-17 MED ORDER — ONDANSETRON HCL 4 MG PO TABS: 4.0000 mg | ORAL_TABLET | Freq: Four times a day (QID) | ORAL | Status: DC | PRN

## 2020-12-17 MED ORDER — ZOLPIDEM TARTRATE 5 MG PO TABS
5.0000 mg | ORAL_TABLET | Freq: Every evening | ORAL | Status: DC | PRN
  Administered 2020-12-22: 5 mg via ORAL
  Filled 2020-12-17: qty 1
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
## 2020-12-17 NOTE — ED Notes (Signed)
Anderson Malta RN aware of assigned bed

## 2020-12-17 NOTE — ED Notes (Signed)
Ok to stick R arm w/ hx of breast cancer per Dr. Cheri Fowler.

## 2020-12-17 NOTE — ED Notes (Signed)
RN attemped 2nd IV x 2, RW, RAC. IV team consulted.
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
## 2020-12-17 NOTE — Progress Notes (Signed)
Patient arrived to unit from ER in stable condition with son at bedside

## 2020-12-18 DIAGNOSIS — S42292A Other displaced fracture of upper end of left humerus, initial encounter for closed fracture: Secondary | ICD-10-CM | POA: Diagnosis not present

## 2020-12-18 DIAGNOSIS — K5900 Constipation, unspecified: Secondary | ICD-10-CM | POA: Diagnosis not present

## 2020-12-18 DIAGNOSIS — G9340 Encephalopathy, unspecified: Secondary | ICD-10-CM | POA: Diagnosis not present

## 2020-12-18 DIAGNOSIS — M069 Rheumatoid arthritis, unspecified: Secondary | ICD-10-CM | POA: Diagnosis not present

## 2020-12-18 DIAGNOSIS — R531 Weakness: Secondary | ICD-10-CM | POA: Diagnosis not present

## 2020-12-18 DIAGNOSIS — D649 Anemia, unspecified: Secondary | ICD-10-CM | POA: Diagnosis not present

## 2020-12-18 DIAGNOSIS — G9341 Metabolic encephalopathy: Secondary | ICD-10-CM | POA: Diagnosis not present

## 2020-12-18 LAB — HEPATIC FUNCTION PANEL
ALT: 11 U/L (ref 0–44)
AST: 25 U/L (ref 15–41)
Albumin: 3.8 g/dL (ref 3.5–5.0)
Alkaline Phosphatase: 55 U/L (ref 38–126)
Bilirubin, Direct: 0.3 mg/dL — ABNORMAL HIGH (ref 0.0–0.2)
Indirect Bilirubin: 1.5 mg/dL — ABNORMAL HIGH (ref 0.3–0.9)
Total Bilirubin: 1.8 mg/dL — ABNORMAL HIGH (ref 0.3–1.2)
Total Protein: 7.7 g/dL (ref 6.5–8.1)

## 2020-12-18 LAB — URINE CULTURE

## 2020-12-18 LAB — BASIC METABOLIC PANEL
Anion gap: 9 (ref 5–15)
BUN: 23 mg/dL (ref 8–23)
CO2: 25 mmol/L (ref 22–32)
Calcium: 9.2 mg/dL (ref 8.9–10.3)
Chloride: 99 mmol/L (ref 98–111)
Creatinine, Ser: 0.95 mg/dL (ref 0.44–1.00)
GFR, Estimated: >60 mL/min (ref >60)
Glucose, Bld: 110 mg/dL — ABNORMAL HIGH (ref 70–99)
Potassium: 4.3 mmol/L (ref 3.5–5.1)
Sodium: 133 mmol/L — ABNORMAL LOW (ref 135–145)

## 2020-12-18 LAB — CBC
HCT: 39.8 % (ref 36.0–46.0)
Hemoglobin: 14 g/dL (ref 12.0–15.0)
MCH: 35.4 pg — ABNORMAL HIGH (ref 26.0–34.0)
MCHC: 35.2 g/dL (ref 30.0–36.0)
MCV: 100.8 fL — ABNORMAL HIGH (ref 80.0–100.0)
Platelets: 257 10*3/uL (ref 150–400)
RBC: 3.95 MIL/uL (ref 3.87–5.11)
RDW: 13.1 % (ref 11.5–15.5)
WBC: 8.3 10*3/uL (ref 4.0–10.5)
nRBC: 0 % (ref 0.0–0.2)

## 2020-12-18 LAB — TSH: TSH: 1.146 u[IU]/mL (ref 0.350–4.500)

## 2020-12-18 LAB — RPR: RPR Ser Ql: NONREACTIVE

## 2020-12-18 LAB — VITAMIN B12: Vitamin B-12: 578 pg/mL (ref 180–914)

## 2020-12-18 MED ORDER — LACTATED RINGERS IV SOLN: INTRAVENOUS | Status: DC
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
## 2020-12-18 NOTE — NC FL2 (Signed)
[place] LEVEL OF CARE SCREENING TOOL     IDENTIFICATION  Patient Name: Elizabeth Bailey Birthdate: 1940/03/05 Sex: female Admission Date (Current Location): 12/17/2020  [facility] and [place] Number:  Engineering geologist and Address:  [facility], [address], [place], [place] [address]      Provider Number: [id number]  Attending Physician Name and Address:  Kerney Elbe, DO  Relative Name and Phone Number:  Festus Barren (son) [phone]    Current Level of Care: Hospital Recommended Level of Care: Black Prior Approval Number:    Date Approved/Denied:   PASRR Number: [id number]  Discharge Plan: SNF    Current Diagnoses: Patient Active Problem List   Diagnosis Date Noted   Weakness 38/46/6599   Acute metabolic encephalopathy    Closed fracture of left proximal humerus    Stage 3a chronic kidney disease ([place])    Chronic obstructive pulmonary disease ([place])    Acute vaginitis 07/14/2019   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   PAD (peripheral artery disease) ([place]) 10/21/2017   Osteoarthritis of hands, 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   Malignant neoplasm of upper-inner quadrant of right breast in female, estrogen receptor positive ([place]) 03/30/2017   Gastroesophageal reflux disease without esophagitis 12/07/2016   Hyponatremia 02/17/2016   Status post exploratory laparotomy 02/06/2016   Nodule of finger 03/28/2015   Rheumatoid arthritis involving multiple sites with positive rheumatoid factor ([place]) 01/09/2015   Insomnia 01/09/2015   Allergic state 11/28/2014   Asthma 11/28/2014   Centrilobular emphysema ([place]) 11/28/2014   Hypertension 11/28/2014   Carotid artery stenosis 11/28/2014   H/O carotid endarterectomy 11/28/2014   Seasonal allergies 11/28/2014   Hyperlipidemia 11/28/2014   Abnormal finding on liver function 11/29/2013   Rheumatoid arthritis ([place]) 11/29/2013   Iritis 11/29/2013   Lumbar radiculitis  08/22/2013   Herpes zona 08/08/2013   Cervical radiculitis 08/07/2013   DDD (degenerative disc disease), cervical 08/07/2013    Orientation RESPIRATION BLADDER Height & Weight     Self, Time, Situation, Place  Normal Continent Weight: 70.8 kg Height:  5\' 5"  (165.1 cm)  BEHAVIORAL SYMPTOMS/MOOD NEUROLOGICAL BOWEL NUTRITION STATUS      Continent Diet (see discharge summary)  AMBULATORY STATUS COMMUNICATION OF NEEDS Skin   Limited Assist Verbally Bruising (Left shoulder)                       Personal Care Assistance Level of Assistance  Bathing, Feeding, Dressing Bathing Assistance: Limited assistance Feeding assistance: Limited assistance Dressing Assistance: Limited assistance     Functional Limitations Info  Sight, Hearing, Speech Sight Info: Adequate Hearing Info: Impaired Speech Info: Adequate    SPECIAL CARE FACTORS FREQUENCY  PT (By licensed PT), OT (By licensed OT)     PT Frequency: 5 times per week OT Frequency: 5 times per week            Contractures Contractures Info: Not present    Additional Factors Info  Code Status, Allergies Code Status Info: Full Allergies Info: Flucytonsine, Fluticasone-salmeterol, naproxen           Current Medications (12/18/2020):  This is the current hospital active medication list Current Facility-Administered Medications  Medication Dose Route Frequency Provider Last Rate Last Admin   acetaminophen (TYLENOL) tablet 650 mg  650 mg Oral Q6H PRN Loletha Grayer, MD   650 mg at 12/18/20 1014   Or   acetaminophen (TYLENOL) suppository 650 mg  650 mg Rectal Q6H PRN Loletha Grayer, MD  albuterol (PROVENTIL) (2.5 MG/3ML) 0.083% nebulizer solution 3 mL  3 mL Inhalation Q6H PRN Wieting, Richard, MD       amLODipine (NORVASC) tablet 5 mg  5 mg Oral Daily Wieting, Richard, MD   5 mg at 12/18/20 1014   calcium-vitamin D (OSCAL WITH D) 500MG -200UNIT (5MCG) per tablet 1 tablet  1 tablet Oral BID Renda Rolls, RPH   1  tablet at 12/18/20 1014   diclofenac Sodium (VOLTAREN) 1 % topical gel 4 g  4 g Topical QID Loletha Grayer, MD   4 g at 12/18/20 1017   enoxaparin (LOVENOX) injection 40 mg  40 mg Subcutaneous Q24H Loletha Grayer, MD   40 mg at 12/17/20 2118   lactated ringers infusion   Intravenous Continuous Raiford Noble [place], DO 75 mL/hr at 12/18/20 1023 New Bag at 12/18/20 1023   melatonin tablet 10 mg  10 mg Oral QHS Loletha Grayer, MD   10 mg at 12/17/20 2117   mometasone-formoterol (DULERA) 200-5 MCG/ACT inhaler 2 puff  2 puff Inhalation BID Loletha Grayer, MD   2 puff at 12/18/20 1016   montelukast (SINGULAIR) tablet 10 mg  10 mg Oral QHS Wieting, Richard, MD   10 mg at 12/17/20 2116   ondansetron (ZOFRAN) tablet 4 mg  4 mg Oral Q6H PRN Loletha Grayer, MD       Or   ondansetron [facility]) injection 4 mg  4 mg Intravenous Q6H PRN Wieting, Richard, MD       oxyCODONE (Oxy IR/ROXICODONE) immediate release tablet 5 mg  5 mg Oral Q4H PRN Loletha Grayer, MD   5 mg at 12/18/20 1014   pregabalin (LYRICA) capsule 75 mg  75 mg Oral BID Loletha Grayer, MD   75 mg at 12/18/20 1014   simvastatin (ZOCOR) tablet 20 mg  20 mg Oral Gertie Gowda, Richard, MD   20 mg at 12/17/20 2116   vitamin B-12 (CYANOCOBALAMIN) tablet 1,000 mcg  1,000 mcg Oral Daily Loletha Grayer, MD   1,000 mcg at 12/18/20 1014   zolpidem (AMBIEN) tablet 5 mg  5 mg Oral QHS PRN Loletha Grayer, MD         Discharge Medications: Please see discharge summary for a list of discharge medications.  Relevant Imaging Results:  Relevant Lab Results:   Additional Information SS: [id number]  Shelbie Hutching, RN

## 2020-12-18 NOTE — Care Management Obs Status (Signed)
[place] NOTIFICATION   Patient Details  Name: Elizabeth Bailey MRN: [id number] Date of Birth: 1941/01/21   Medicare Observation Status Notification Given:  Yes    Shelbie Hutching, RN 12/18/2020, 11:19 AM

## 2020-12-19 DIAGNOSIS — G9341 Metabolic encephalopathy: Secondary | ICD-10-CM | POA: Diagnosis not present

## 2020-12-19 DIAGNOSIS — K5900 Constipation, unspecified: Secondary | ICD-10-CM | POA: Diagnosis not present

## 2020-12-19 DIAGNOSIS — R531 Weakness: Secondary | ICD-10-CM | POA: Diagnosis not present

## 2020-12-19 DIAGNOSIS — G9340 Encephalopathy, unspecified: Secondary | ICD-10-CM | POA: Diagnosis not present

## 2020-12-19 DIAGNOSIS — M069 Rheumatoid arthritis, unspecified: Secondary | ICD-10-CM | POA: Diagnosis not present

## 2020-12-19 DIAGNOSIS — D649 Anemia, unspecified: Secondary | ICD-10-CM | POA: Diagnosis not present

## 2020-12-19 DIAGNOSIS — S42292A Other displaced fracture of upper end of left humerus, initial encounter for closed fracture: Secondary | ICD-10-CM | POA: Diagnosis not present

## 2020-12-19 LAB — COMPREHENSIVE METABOLIC PANEL WITH GFR
ALT: 10 U/L (ref 0–44)
AST: 23 U/L (ref 15–41)
Albumin: 3.1 g/dL — ABNORMAL LOW (ref 3.5–5.0)
Alkaline Phosphatase: 43 U/L (ref 38–126)
Anion gap: 4 — ABNORMAL LOW (ref 5–15)
BUN: 22 mg/dL (ref 8–23)
CO2: 27 mmol/L (ref 22–32)
Calcium: 8.5 mg/dL — ABNORMAL LOW (ref 8.9–10.3)
Chloride: 100 mmol/L (ref 98–111)
Creatinine, Ser: 0.85 mg/dL (ref 0.44–1.00)
GFR, Estimated: >60 mL/min
Glucose, Bld: 101 mg/dL — ABNORMAL HIGH (ref 70–99)
Potassium: 3.8 mmol/L (ref 3.5–5.1)
Sodium: 131 mmol/L — ABNORMAL LOW (ref 135–145)
Total Bilirubin: 1.8 mg/dL — ABNORMAL HIGH (ref 0.3–1.2)
Total Protein: 6.5 g/dL (ref 6.5–8.1)

## 2020-12-19 LAB — MAGNESIUM: Magnesium: 1.8 mg/dL (ref 1.7–2.4)

## 2020-12-19 LAB — CBC WITH DIFFERENTIAL/PLATELET
Abs Immature Granulocytes: 0.03 K/uL (ref 0.00–0.07)
Basophils Absolute: 0 K/uL (ref 0.0–0.1)
Basophils Relative: 0 %
Eosinophils Absolute: 0 K/uL (ref 0.0–0.5)
Eosinophils Relative: 1 %
HCT: 34.3 % — ABNORMAL LOW (ref 36.0–46.0)
Hemoglobin: 12 g/dL (ref 12.0–15.0)
Immature Granulocytes: 0 %
Lymphocytes Relative: 21 %
Lymphs Abs: 1.5 K/uL (ref 0.7–4.0)
MCH: 35.6 pg — ABNORMAL HIGH (ref 26.0–34.0)
MCHC: 35 g/dL (ref 30.0–36.0)
MCV: 101.8 fL — ABNORMAL HIGH (ref 80.0–100.0)
Monocytes Absolute: 0.7 K/uL (ref 0.1–1.0)
Monocytes Relative: 10 %
Neutro Abs: 5 K/uL (ref 1.7–7.7)
Neutrophils Relative %: 68 %
Platelets: 218 K/uL (ref 150–400)
RBC: 3.37 MIL/uL — ABNORMAL LOW (ref 3.87–5.11)
RDW: 13.1 % (ref 11.5–15.5)
WBC: 7.3 K/uL (ref 4.0–10.5)
nRBC: 0 % (ref 0.0–0.2)

## 2020-12-19 LAB — PHOSPHORUS: Phosphorus: 3.1 mg/dL (ref 2.5–4.6)

## 2020-12-19 MED ORDER — AMLODIPINE BESYLATE 10 MG PO TABS
10.0000 mg | ORAL_TABLET | Freq: Every day | ORAL | Status: DC
  Administered 2020-12-19 – 2020-12-23 (×5): 10 mg via ORAL
  Filled 2020-12-19 (×5): qty 1

## 2020-12-19 MED ORDER — MAGNESIUM SULFATE 2 GM/50ML IV SOLN
2.0000 g | Freq: Once | INTRAVENOUS | Status: AC
  Administered 2020-12-19: 09:00:00 2 g via INTRAVENOUS
  Filled 2020-12-19: qty 50

## 2020-12-19 MED ORDER — ALUM & MAG HYDROXIDE-SIMETH 200-200-20 MG/5ML PO SUSP
30.0000 mL | ORAL | Status: DC | PRN
  Administered 2020-12-19 – 2020-12-20 (×2): 30 mL via ORAL
  Filled 2020-12-19 (×2): qty 30
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
## 2020-12-19 NOTE — Progress Notes (Signed)
PT Cancellation Note  Patient Details Name: Elizabeth Bailey MRN: [id number] DOB: March 22, 1940   Cancelled Treatment:    Reason Eval/Treat Not Completed: Fatigue/lethargy limiting ability to participate. Patient declines PT at this time. Requests that I return tomorrow.    [name] 12/19/2020, 2:28 PM
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
## 2020-12-19 NOTE — TOC Progression Note (Signed)
Transition of Care [facility]) - Progression Note    Patient Details  Name: Elizabeth Bailey MRN: [id number] Date of Birth: 1940/03/27  [facility]) CM/SW Contact  Shelbie Hutching, RN Phone Number: 12/19/2020, 4:06 PM  Clinical Narrative:    Patient has not bed offers due to liability insurance due to [facility] on Monday.  Bed search expanded and RNCM reached out to leadership for guidance.     Expected Discharge Plan: [place] Barriers to Discharge: Continued Medical Work up  Expected Discharge Plan and Services Expected Discharge Plan: [place]   Discharge Planning Services: CM Consult Post Acute Care Choice: [place] Living arrangements for the past 2 months: Single Family Home                 DME Arranged: N/A DME Agency: NA       HH Arranged: NA HH Agency: NA         Social Determinants of Health (SDOH) Interventions    Readmission Risk Interventions No flowsheet data found.

## 2020-12-20 DIAGNOSIS — R531 Weakness: Secondary | ICD-10-CM | POA: Diagnosis not present

## 2020-12-20 DIAGNOSIS — K5900 Constipation, unspecified: Secondary | ICD-10-CM | POA: Diagnosis not present

## 2020-12-20 DIAGNOSIS — S42292A Other displaced fracture of upper end of left humerus, initial encounter for closed fracture: Secondary | ICD-10-CM

## 2020-12-20 DIAGNOSIS — G9340 Encephalopathy, unspecified: Secondary | ICD-10-CM | POA: Diagnosis not present

## 2020-12-20 DIAGNOSIS — S42202A Unspecified fracture of upper end of left humerus, initial encounter for closed fracture: Secondary | ICD-10-CM | POA: Diagnosis not present

## 2020-12-20 DIAGNOSIS — D649 Anemia, unspecified: Secondary | ICD-10-CM

## 2020-12-20 DIAGNOSIS — G9341 Metabolic encephalopathy: Secondary | ICD-10-CM | POA: Diagnosis not present

## 2020-12-20 DIAGNOSIS — M069 Rheumatoid arthritis, unspecified: Secondary | ICD-10-CM | POA: Diagnosis not present

## 2020-12-20 LAB — COMPREHENSIVE METABOLIC PANEL
ALT: 12 U/L (ref 0–44)
AST: 18 U/L (ref 15–41)
Albumin: 2.7 g/dL — ABNORMAL LOW (ref 3.5–5.0)
Alkaline Phosphatase: 48 U/L (ref 38–126)
Anion gap: 6 (ref 5–15)
BUN: 22 mg/dL (ref 8–23)
CO2: 28 mmol/L (ref 22–32)
Calcium: 8.1 mg/dL — ABNORMAL LOW (ref 8.9–10.3)
Chloride: 99 mmol/L (ref 98–111)
Creatinine, Ser: 0.98 mg/dL (ref 0.44–1.00)
GFR, Estimated: 59 mL/min — ABNORMAL LOW (ref >60)
Glucose, Bld: 112 mg/dL — ABNORMAL HIGH (ref 70–99)
Potassium: 3.5 mmol/L (ref 3.5–5.1)
Sodium: 133 mmol/L — ABNORMAL LOW (ref 135–145)
Total Bilirubin: 1.2 mg/dL (ref 0.3–1.2)
Total Protein: 6 g/dL — ABNORMAL LOW (ref 6.5–8.1)

## 2020-12-20 LAB — CBC WITH DIFFERENTIAL/PLATELET
Abs Immature Granulocytes: 0.03 10*3/uL (ref 0.00–0.07)
Basophils Absolute: 0 10*3/uL (ref 0.0–0.1)
Basophils Relative: 0 %
Eosinophils Absolute: 0.1 10*3/uL (ref 0.0–0.5)
Eosinophils Relative: 2 %
HCT: 33 % — ABNORMAL LOW (ref 36.0–46.0)
Hemoglobin: 11.3 g/dL — ABNORMAL LOW (ref 12.0–15.0)
Immature Granulocytes: 1 %
Lymphocytes Relative: 23 %
Lymphs Abs: 1.4 10*3/uL (ref 0.7–4.0)
MCH: 34.8 pg — ABNORMAL HIGH (ref 26.0–34.0)
MCHC: 34.2 g/dL (ref 30.0–36.0)
MCV: 101.5 fL — ABNORMAL HIGH (ref 80.0–100.0)
Monocytes Absolute: 0.6 10*3/uL (ref 0.1–1.0)
Monocytes Relative: 10 %
Neutro Abs: 4 10*3/uL (ref 1.7–7.7)
Neutrophils Relative %: 64 %
Platelets: 234 10*3/uL (ref 150–400)
RBC: 3.25 MIL/uL — ABNORMAL LOW (ref 3.87–5.11)
RDW: 13.1 % (ref 11.5–15.5)
WBC: 6.1 10*3/uL (ref 4.0–10.5)
nRBC: 0 % (ref 0.0–0.2)

## 2020-12-20 LAB — PHOSPHORUS: Phosphorus: 3.5 mg/dL (ref 2.5–4.6)

## 2020-12-20 LAB — MAGNESIUM: Magnesium: 2.4 mg/dL (ref 1.7–2.4)

## 2020-12-20 MED ORDER — LETROZOLE 2.5 MG PO TABS
2.5000 mg | ORAL_TABLET | Freq: Every day | ORAL | Status: DC
  Administered 2020-12-20 – 2020-12-23 (×4): 2.5 mg via ORAL
  Filled 2020-12-20 (×4): qty 1

## 2020-12-20 MED ORDER — OXYCODONE HCL 5 MG PO TABS
5.0000 mg | ORAL_TABLET | ORAL | Status: DC | PRN
  Administered 2020-12-20 (×2): 5 mg via ORAL
  Administered 2020-12-21 (×3): 10 mg via ORAL
  Administered 2020-12-21: 5 mg via ORAL
  Administered 2020-12-22 – 2020-12-23 (×4): 10 mg via ORAL
  Filled 2020-12-20 (×2): qty 2
  Filled 2020-12-20: qty 1
  Filled 2020-12-20 (×6): qty 2
  Filled 2020-12-20: qty 1

## 2020-12-20 MED ORDER — SODIUM CHLORIDE 0.9 % IV SOLN: INTRAVENOUS | Status: DC

## 2020-12-20 MED ORDER — POTASSIUM CHLORIDE CRYS ER 20 MEQ PO TBCR
40.0000 meq | EXTENDED_RELEASE_TABLET | Freq: Two times a day (BID) | ORAL | Status: AC
  Administered 2020-12-20 (×2): 40 meq via ORAL
  Filled 2020-12-20 (×2): qty 2

## 2020-12-20 MED ORDER — MORPHINE SULFATE (PF) 2 MG/ML IV SOLN
1.0000 mg | INTRAVENOUS | Status: DC | PRN
Start: 2020-12-20 — End: 2020-12-21
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
## 2020-12-20 NOTE — Progress Notes (Signed)
Patient ID: Elizabeth Bailey, female   DOB: 1940-10-01, 80 y.o.   MRN: [id number]  Subjective: The patient has no new complaints.  She notes moderate discomfort in her shoulder, especially with shifting positions in bed or when being assisted to get out of the bed, but otherwise feels that she is doing well.   Objective: Vital signs in last 24 hours: Temp:  [97.5 F (36.4 C)-98.5 F (36.9 C)] 97.5 F (36.4 C) (10/21 0757) Pulse Rate:  [66-77] 67 (10/21 0757) Resp:  [16-20] 18 (10/21 0757) BP: (111-163)/(53-78) 162/71 (10/21 0757) SpO2:  [88 %-96 %] 92 % (10/21 0757)  Intake/Output from previous day: No intake/output data recorded. Intake/Output this shift: No intake/output data recorded.  Recent Labs    12/17/20 1021 12/18/20 0428 12/19/20 0528 12/20/20 0218  HGB 14.6 14.0 12.0 11.3*   Recent Labs    12/19/20 0528 12/20/20 0218  WBC 7.3 6.1  RBC 3.37* 3.25*  HCT 34.3* 33.0*  PLT 218 234   Recent Labs    12/19/20 0528 12/20/20 0218  NA 131* 133*  K 3.8 3.5  CL 100 99  CO2 27 28  BUN 22 22  CREATININE 0.85 0.98  GLUCOSE 101* 112*  CALCIUM 8.5* 8.1*   No results for input(s): LABPT, INR in the last 72 hours.  Physical Exam: Orthopedic examination again is limited to the left shoulder and upper extremity.  Skin inspection around the left shoulder is notable for some ecchymosis but otherwise is unremarkable.  She has mild tenderness to palpation of the anterolateral aspect.  She has more severe pain with any attempted active or passive motion of the shoulder.  She is neurovascularly intact in the left upper extremity and hand.  Assessment: Minimally impacted left proximal humerus fractures.  Plan: The treatment options have been reviewed with the patient.  We will continue to manage her nonsurgically in her sling, which appears to be tolerated well.  I agree with the plan for transition to skilled nursing facility for rehab.  I will sign off at this time.  Thank  you for asking me to participate in the care of this most delightful woman.  Please arrange for her to follow-up in our office in 3 to 4 weeks.  If you need further orthopedic input during this hospitalization, please reconsult me.   Marshall Cork Dayshaun Whobrey 12/20/2020, 8:07 AM

## 2020-12-21 DIAGNOSIS — G9341 Metabolic encephalopathy: Secondary | ICD-10-CM | POA: Diagnosis not present

## 2020-12-21 DIAGNOSIS — R531 Weakness: Secondary | ICD-10-CM | POA: Diagnosis not present

## 2020-12-21 DIAGNOSIS — K5900 Constipation, unspecified: Secondary | ICD-10-CM | POA: Diagnosis not present

## 2020-12-21 DIAGNOSIS — M069 Rheumatoid arthritis, unspecified: Secondary | ICD-10-CM | POA: Diagnosis not present

## 2020-12-21 DIAGNOSIS — D649 Anemia, unspecified: Secondary | ICD-10-CM | POA: Diagnosis not present

## 2020-12-21 DIAGNOSIS — S42292A Other displaced fracture of upper end of left humerus, initial encounter for closed fracture: Secondary | ICD-10-CM | POA: Diagnosis not present

## 2020-12-21 DIAGNOSIS — G9340 Encephalopathy, unspecified: Secondary | ICD-10-CM | POA: Diagnosis not present

## 2020-12-21 MED ORDER — POLYETHYLENE GLYCOL 3350 17 G PO PACK
17.0000 g | PACK | Freq: Two times a day (BID) | ORAL | Status: DC
  Administered 2020-12-21 – 2020-12-23 (×5): 17 g via ORAL
  Filled 2020-12-21 (×5): qty 1

## 2020-12-21 MED ORDER — SENNOSIDES-DOCUSATE SODIUM 8.6-50 MG PO TABS
1.0000 | ORAL_TABLET | Freq: Two times a day (BID) | ORAL | Status: DC
  Administered 2020-12-21 – 2020-12-23 (×5): 1 via ORAL
  Filled 2020-12-21 (×5): qty 1

## 2020-12-21 MED ORDER — MORPHINE SULFATE (PF) 2 MG/ML IV SOLN
1.0000 mg | INTRAVENOUS | Status: DC | PRN
  Administered 2020-12-22: 1 mg via INTRAVENOUS
  Filled 2020-12-21: qty 1

## 2020-12-21 MED ORDER — BISACODYL 10 MG RE SUPP: 10.0000 mg | Freq: Every day | RECTAL | Status: DC | PRN

## 2020-12-21 MED ORDER — SODIUM CHLORIDE 0.9 % IV SOLN: INTRAVENOUS | Status: AC

## 2020-12-21 NOTE — TOC Progression Note (Signed)
Transition of Care [facility]) - Progression Note    Patient Details  Name: Elizabeth Bailey MRN: [id number] Date of Birth: 07-22-1940  Transition of Care [facility]) CM/SW [place], [place] Phone Number: 12/21/2020, 3:17 PM  Clinical Narrative:   CSW spoke with Ebony Hail with admissions at [facility] who stated they could take patient Monday. CSW will speak with patient     Expected Discharge Plan: [place] Barriers to Discharge: Continued Medical Work up  Expected Discharge Plan and Services Expected Discharge Plan: [place]   Discharge Planning Services: CM Consult Post Acute Care Choice: [place] Living arrangements for the past 2 months: Single Family Home                 DME Arranged: N/A DME Agency: NA       HH Arranged: NA HH Agency: NA         Social Determinants of Health (SDOH) Interventions    Readmission Risk Interventions No flowsheet data found.

## 2020-12-21 NOTE — TOC Progression Note (Signed)
Transition of Care [facility]) - Progression Note    Patient Details  Name: Elizabeth Bailey MRN: [id number] Date of Birth: 1940-12-26  Transition of Care [facility]) CM/SW [place], [place] Phone Number: 12/21/2020, 9:24 AM  Clinical Narrative:  CSW contacted [facility] rehab who accepted patient in the hub. The front desk staff did not think anyone from admissions worked over the weekend. CSW was given admissions number Ebony Hail, [phone]). CSW sent a message. CSW awaiting a response back.     Expected Discharge Plan: [place] Barriers to Discharge: Continued Medical Work up  Expected Discharge Plan and Services Expected Discharge Plan: [place]   Discharge Planning Services: CM Consult Post Acute Care Choice: [place] Living arrangements for the past 2 months: Single Family Home                 DME Arranged: N/A DME Agency: NA       HH Arranged: NA HH Agency: NA         Social Determinants of Health (SDOH) Interventions    Readmission Risk Interventions No flowsheet data found.
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
## 2020-12-21 NOTE — Progress Notes (Signed)
CSW left a message with patients son, Festus Barren.
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
## 2020-12-21 NOTE — Progress Notes (Signed)
Message left with Otila Kluver at [facility]

## 2020-12-22 ENCOUNTER — Observation Stay: Payer: Medicare PPO

## 2020-12-22 DIAGNOSIS — S42295A Other nondisplaced fracture of upper end of left humerus, initial encounter for closed fracture: Secondary | ICD-10-CM | POA: Diagnosis not present

## 2020-12-22 DIAGNOSIS — R059 Cough, unspecified: Secondary | ICD-10-CM | POA: Diagnosis not present

## 2020-12-22 DIAGNOSIS — R0602 Shortness of breath: Secondary | ICD-10-CM | POA: Diagnosis not present

## 2020-12-22 DIAGNOSIS — G9341 Metabolic encephalopathy: Secondary | ICD-10-CM | POA: Diagnosis not present

## 2020-12-22 DIAGNOSIS — J90 Pleural effusion, not elsewhere classified: Secondary | ICD-10-CM | POA: Diagnosis not present

## 2020-12-22 LAB — URINALYSIS, COMPLETE (UACMP) WITH MICROSCOPIC
Bilirubin Urine: NEGATIVE
Glucose, UA: NEGATIVE mg/dL
Hgb urine dipstick: NEGATIVE
Ketones, ur: NEGATIVE mg/dL
Nitrite: NEGATIVE
Protein, ur: NEGATIVE mg/dL
Specific Gravity, Urine: 1.009 (ref 1.005–1.030)
pH: 6 (ref 5.0–8.0)

## 2020-12-22 LAB — COMPREHENSIVE METABOLIC PANEL
ALT: 14 U/L (ref 0–44)
AST: 22 U/L (ref 15–41)
Albumin: 2.7 g/dL — ABNORMAL LOW (ref 3.5–5.0)
Alkaline Phosphatase: 45 U/L (ref 38–126)
Anion gap: 7 (ref 5–15)
BUN: 12 mg/dL (ref 8–23)
CO2: 25 mmol/L (ref 22–32)
Calcium: 8.1 mg/dL — ABNORMAL LOW (ref 8.9–10.3)
Chloride: 100 mmol/L (ref 98–111)
Creatinine, Ser: 0.67 mg/dL (ref 0.44–1.00)
GFR, Estimated: >60 mL/min (ref >60)
Glucose, Bld: 108 mg/dL — ABNORMAL HIGH (ref 70–99)
Potassium: 4 mmol/L (ref 3.5–5.1)
Sodium: 132 mmol/L — ABNORMAL LOW (ref 135–145)
Total Bilirubin: 1.5 mg/dL — ABNORMAL HIGH (ref 0.3–1.2)
Total Protein: 6.1 g/dL — ABNORMAL LOW (ref 6.5–8.1)

## 2020-12-22 LAB — CBC WITH DIFFERENTIAL/PLATELET
Abs Immature Granulocytes: 0.02 10*3/uL (ref 0.00–0.07)
Basophils Absolute: 0 10*3/uL (ref 0.0–0.1)
Basophils Relative: 0 %
Eosinophils Absolute: 0.2 10*3/uL (ref 0.0–0.5)
Eosinophils Relative: 3 %
HCT: 34.9 % — ABNORMAL LOW (ref 36.0–46.0)
Hemoglobin: 11.4 g/dL — ABNORMAL LOW (ref 12.0–15.0)
Immature Granulocytes: 0 %
Lymphocytes Relative: 13 %
Lymphs Abs: 0.8 10*3/uL (ref 0.7–4.0)
MCH: 33.6 pg (ref 26.0–34.0)
MCHC: 32.7 g/dL (ref 30.0–36.0)
MCV: 102.9 fL — ABNORMAL HIGH (ref 80.0–100.0)
Monocytes Absolute: 0.6 10*3/uL (ref 0.1–1.0)
Monocytes Relative: 10 %
Neutro Abs: 4.3 10*3/uL (ref 1.7–7.7)
Neutrophils Relative %: 74 %
Platelets: 253 10*3/uL (ref 150–400)
RBC: 3.39 MIL/uL — ABNORMAL LOW (ref 3.87–5.11)
RDW: 13.2 % (ref 11.5–15.5)
WBC: 5.9 10*3/uL (ref 4.0–10.5)
nRBC: 0 % (ref 0.0–0.2)

## 2020-12-22 LAB — BRAIN NATRIURETIC PEPTIDE: B Natriuretic Peptide: 235.6 pg/mL — ABNORMAL HIGH (ref 0.0–100.0)

## 2020-12-22 LAB — PROCALCITONIN: Procalcitonin: <0.1 ng/mL

## 2020-12-22 LAB — PHOSPHORUS: Phosphorus: 3.5 mg/dL (ref 2.5–4.6)

## 2020-12-22 LAB — MAGNESIUM: Magnesium: 1.8 mg/dL (ref 1.7–2.4)

## 2020-12-22 MED ORDER — DM-GUAIFENESIN ER 30-600 MG PO TB12
1.0000 | ORAL_TABLET | Freq: Two times a day (BID) | ORAL | Status: DC
  Administered 2020-12-22 – 2020-12-23 (×2): 1 via ORAL
  Filled 2020-12-22 (×2): qty 1

## 2020-12-22 MED ORDER — FUROSEMIDE 10 MG/ML IJ SOLN
20.0000 mg | Freq: Once | INTRAMUSCULAR | Status: AC
  Administered 2020-12-22: 18:00:00 20 mg via INTRAVENOUS
  Filled 2020-12-22: qty 2

## 2020-12-22 MED ORDER — IPRATROPIUM-ALBUTEROL 0.5-2.5 (3) MG/3ML IN SOLN
3.0000 mL | RESPIRATORY_TRACT | Status: DC | PRN
  Administered 2020-12-22: 3 mL via RESPIRATORY_TRACT
  Filled 2020-12-22: qty 3

## 2020-12-22 NOTE — Progress Notes (Signed)
Patient has a congested cough and is coughing up copious amount of flem. She is more SOB on exertion. Made MD aware, see new orders.
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
## 2020-12-23 DIAGNOSIS — S42202A Unspecified fracture of upper end of left humerus, initial encounter for closed fracture: Secondary | ICD-10-CM | POA: Diagnosis not present

## 2020-12-23 DIAGNOSIS — I10 Essential (primary) hypertension: Secondary | ICD-10-CM | POA: Diagnosis not present

## 2020-12-23 DIAGNOSIS — Z20822 Contact with and (suspected) exposure to covid-19: Secondary | ICD-10-CM | POA: Diagnosis not present

## 2020-12-23 DIAGNOSIS — M6281 Muscle weakness (generalized): Secondary | ICD-10-CM | POA: Diagnosis not present

## 2020-12-23 DIAGNOSIS — Z7401 Bed confinement status: Secondary | ICD-10-CM | POA: Diagnosis not present

## 2020-12-23 DIAGNOSIS — N1831 Chronic kidney disease, stage 3a: Secondary | ICD-10-CM | POA: Diagnosis not present

## 2020-12-23 DIAGNOSIS — I739 Peripheral vascular disease, unspecified: Secondary | ICD-10-CM | POA: Diagnosis not present

## 2020-12-23 DIAGNOSIS — R262 Difficulty in walking, not elsewhere classified: Secondary | ICD-10-CM | POA: Diagnosis not present

## 2020-12-23 DIAGNOSIS — J432 Centrilobular emphysema: Secondary | ICD-10-CM | POA: Diagnosis not present

## 2020-12-23 DIAGNOSIS — J441 Chronic obstructive pulmonary disease with (acute) exacerbation: Secondary | ICD-10-CM | POA: Diagnosis not present

## 2020-12-23 DIAGNOSIS — Z170 Estrogen receptor positive status [ER+]: Secondary | ICD-10-CM | POA: Diagnosis not present

## 2020-12-23 DIAGNOSIS — E785 Hyperlipidemia, unspecified: Secondary | ICD-10-CM | POA: Diagnosis not present

## 2020-12-23 DIAGNOSIS — R0902 Hypoxemia: Secondary | ICD-10-CM | POA: Diagnosis not present

## 2020-12-23 DIAGNOSIS — M5416 Radiculopathy, lumbar region: Secondary | ICD-10-CM | POA: Diagnosis not present

## 2020-12-23 DIAGNOSIS — S42295A Other nondisplaced fracture of upper end of left humerus, initial encounter for closed fracture: Secondary | ICD-10-CM | POA: Diagnosis not present

## 2020-12-23 DIAGNOSIS — M79602 Pain in left arm: Secondary | ICD-10-CM | POA: Diagnosis not present

## 2020-12-23 DIAGNOSIS — R404 Transient alteration of awareness: Secondary | ICD-10-CM | POA: Diagnosis not present

## 2020-12-23 DIAGNOSIS — G9341 Metabolic encephalopathy: Secondary | ICD-10-CM | POA: Diagnosis not present

## 2020-12-23 DIAGNOSIS — I129 Hypertensive chronic kidney disease with stage 1 through stage 4 chronic kidney disease, or unspecified chronic kidney disease: Secondary | ICD-10-CM | POA: Diagnosis not present

## 2020-12-23 DIAGNOSIS — R5381 Other malaise: Secondary | ICD-10-CM | POA: Diagnosis not present

## 2020-12-23 DIAGNOSIS — F411 Generalized anxiety disorder: Secondary | ICD-10-CM | POA: Diagnosis not present

## 2020-12-23 DIAGNOSIS — Z853 Personal history of malignant neoplasm of breast: Secondary | ICD-10-CM | POA: Diagnosis not present

## 2020-12-23 DIAGNOSIS — M0579 Rheumatoid arthritis with rheumatoid factor of multiple sites without organ or systems involvement: Secondary | ICD-10-CM | POA: Diagnosis not present

## 2020-12-23 DIAGNOSIS — S42202D Unspecified fracture of upper end of left humerus, subsequent encounter for fracture with routine healing: Secondary | ICD-10-CM | POA: Diagnosis not present

## 2020-12-23 DIAGNOSIS — J45909 Unspecified asthma, uncomplicated: Secondary | ICD-10-CM | POA: Diagnosis not present

## 2020-12-23 DIAGNOSIS — J449 Chronic obstructive pulmonary disease, unspecified: Secondary | ICD-10-CM | POA: Diagnosis not present

## 2020-12-23 DIAGNOSIS — N390 Urinary tract infection, site not specified: Secondary | ICD-10-CM | POA: Diagnosis not present

## 2020-12-23 DIAGNOSIS — R2689 Other abnormalities of gait and mobility: Secondary | ICD-10-CM | POA: Diagnosis not present

## 2020-12-23 LAB — BASIC METABOLIC PANEL
Anion gap: 15 (ref 5–15)
BUN: 13 mg/dL (ref 8–23)
CO2: 26 mmol/L (ref 22–32)
Calcium: 8.1 mg/dL — ABNORMAL LOW (ref 8.9–10.3)
Chloride: 94 mmol/L — ABNORMAL LOW (ref 98–111)
Creatinine, Ser: 0.78 mg/dL (ref 0.44–1.00)
GFR, Estimated: >60 mL/min (ref >60)
Glucose, Bld: 94 mg/dL (ref 70–99)
Potassium: 3.4 mmol/L — ABNORMAL LOW (ref 3.5–5.1)
Sodium: 135 mmol/L (ref 135–145)

## 2020-12-23 LAB — CBC WITH DIFFERENTIAL/PLATELET
Abs Immature Granulocytes: 0.02 10*3/uL (ref 0.00–0.07)
Basophils Absolute: 0 10*3/uL (ref 0.0–0.1)
Basophils Relative: 0 %
Eosinophils Absolute: 0.2 10*3/uL (ref 0.0–0.5)
Eosinophils Relative: 4 %
HCT: 35.3 % — ABNORMAL LOW (ref 36.0–46.0)
Hemoglobin: 11.9 g/dL — ABNORMAL LOW (ref 12.0–15.0)
Immature Granulocytes: 0 %
Lymphocytes Relative: 18 %
Lymphs Abs: 0.9 10*3/uL (ref 0.7–4.0)
MCH: 34.3 pg — ABNORMAL HIGH (ref 26.0–34.0)
MCHC: 33.7 g/dL (ref 30.0–36.0)
MCV: 101.7 fL — ABNORMAL HIGH (ref 80.0–100.0)
Monocytes Absolute: 0.6 10*3/uL (ref 0.1–1.0)
Monocytes Relative: 13 %
Neutro Abs: 3.3 10*3/uL (ref 1.7–7.7)
Neutrophils Relative %: 65 %
Platelets: 245 10*3/uL (ref 150–400)
RBC: 3.47 MIL/uL — ABNORMAL LOW (ref 3.87–5.11)
RDW: 13.2 % (ref 11.5–15.5)
WBC: 5 10*3/uL (ref 4.0–10.5)
nRBC: 0 % (ref 0.0–0.2)

## 2020-12-23 LAB — RESP PANEL BY RT-PCR (FLU A&B, COVID) ARPGX2
Influenza A by PCR: NEGATIVE
Influenza B by PCR: NEGATIVE
SARS Coronavirus 2 by RT PCR: NEGATIVE

## 2020-12-23 LAB — MAGNESIUM: Magnesium: 1.9 mg/dL (ref 1.7–2.4)

## 2020-12-23 MED ORDER — POTASSIUM CHLORIDE 20 MEQ PO PACK
40.0000 meq | PACK | Freq: Once | ORAL | Status: AC
  Administered 2020-12-23: 10:00:00 40 meq via ORAL
  Filled 2020-12-23: qty 2

## 2020-12-23 MED ORDER — IPRATROPIUM-ALBUTEROL 0.5-2.5 (3) MG/3ML IN SOLN: 3.0000 mL | RESPIRATORY_TRACT | Status: AC | PRN

## 2020-12-23 MED ORDER — PREDNISONE 20 MG PO TABS: ORAL_TABLET | ORAL | 0 refills | Status: AC

## 2020-12-23 MED ORDER — AMLODIPINE BESYLATE 10 MG PO TABS
10.0000 mg | ORAL_TABLET | Freq: Every day | ORAL | Status: DC
Start: 2020-12-24 — End: 2021-04-15

## 2020-12-23 MED ORDER — PREDNISONE 20 MG PO TABS: 40.0000 mg | ORAL_TABLET | Freq: Every day | ORAL | Status: DC
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
## 2020-12-23 NOTE — TOC Transition Note (Addendum)
Transition of Care [facility]) - CM/SW Discharge Note   Patient Details  Name: Elizabeth Bailey MRN: [id number] Date of Birth: 1940-09-06  [facility] CM/SW Contact:  Shelbie Hutching, RN Phone Number: 12/23/2020, 12:17 PM   Clinical Narrative:    Patient is medically cleared for discharge to [facility] today.  Son and patient still not thrilled with the idea of patient going so far away from home but agree to discharge.  COVID pending.  Insurance Josem Kaufmann will be started as soon as PT gets their note in for today.     Final next level of care: Skilled Nursing Facility Barriers to Discharge: Barriers Resolved   Patient Goals and CMS Choice Patient states their goals for this hospitalization and ongoing recovery are:: Patient agrees to [facility] CMS Medicare.gov Compare Post Acute Care list provided to:: Patient Choice offered to / list presented to : Patient, Adult Children  Discharge Placement              Patient chooses bed at: [facility] Patient to be transferred to facility by: [facility] Name of family member notified: Larene [place] (son) Patient and family notified of of transfer: 12/23/20  Discharge Plan and Services   Discharge Planning Services: CM Consult Post Acute Care Choice: [place]          DME Arranged: N/A DME Agency: NA       HH Arranged: NA HH Agency: NA        Social Determinants of Health (SDOH) Interventions     Readmission Risk Interventions No flowsheet data found.
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
## 2020-12-23 NOTE — Progress Notes (Signed)
Report called to [facility] healthcare nurse.  AVS printed, IV removed and patient ready for EMS transport.  Patient leaves the unit via stretcher with EMS.

## 2020-12-23 NOTE — TOC Progression Note (Signed)
Transition of Care [facility]) - Progression Note    Patient Details  Name: Elizabeth Bailey MRN: [id number] Date of Birth: Jul 19, 1940  Transition of Care [facility]) CM/SW Contact  Shelbie Hutching, RN Phone Number: 12/23/2020, 10:39 AM  Clinical Narrative:    Patient sitting up in the chair this morning, reports a little short of breath this morning.  [place] has offered a bed, patient wants RNCM to talk with her son, Larene [place].  Message left for [facility] for return call.  COVID test ordered.     Expected Discharge Plan: [place] Barriers to Discharge: Continued Medical Work up  Expected Discharge Plan and Services Expected Discharge Plan: [place]   Discharge Planning Services: CM Consult Post Acute Care Choice: [place] Living arrangements for the past 2 months: Single Family Home                 DME Arranged: N/A DME Agency: NA       HH Arranged: NA HH Agency: NA         Social Determinants of Health (SDOH) Interventions    Readmission Risk Interventions No flowsheet data found.
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
## 2020-12-24 DIAGNOSIS — R5381 Other malaise: Secondary | ICD-10-CM | POA: Diagnosis not present

## 2020-12-24 DIAGNOSIS — M79602 Pain in left arm: Secondary | ICD-10-CM | POA: Diagnosis not present

## 2020-12-24 DIAGNOSIS — M6281 Muscle weakness (generalized): Secondary | ICD-10-CM | POA: Diagnosis not present

## 2020-12-24 DIAGNOSIS — R262 Difficulty in walking, not elsewhere classified: Secondary | ICD-10-CM | POA: Diagnosis not present

## 2020-12-25 DIAGNOSIS — I739 Peripheral vascular disease, unspecified: Secondary | ICD-10-CM | POA: Diagnosis not present

## 2020-12-25 DIAGNOSIS — E785 Hyperlipidemia, unspecified: Secondary | ICD-10-CM | POA: Diagnosis not present

## 2020-12-31 DIAGNOSIS — R5381 Other malaise: Secondary | ICD-10-CM | POA: Diagnosis not present

## 2020-12-31 DIAGNOSIS — R262 Difficulty in walking, not elsewhere classified: Secondary | ICD-10-CM | POA: Diagnosis not present

## 2020-12-31 DIAGNOSIS — M79602 Pain in left arm: Secondary | ICD-10-CM | POA: Diagnosis not present

## 2020-12-31 DIAGNOSIS — M6281 Muscle weakness (generalized): Secondary | ICD-10-CM | POA: Diagnosis not present

## 2021-01-03 DIAGNOSIS — I739 Peripheral vascular disease, unspecified: Secondary | ICD-10-CM | POA: Diagnosis not present

## 2021-01-03 DIAGNOSIS — I10 Essential (primary) hypertension: Secondary | ICD-10-CM | POA: Diagnosis not present

## 2021-01-03 DIAGNOSIS — F411 Generalized anxiety disorder: Secondary | ICD-10-CM | POA: Diagnosis not present

## 2021-01-03 DIAGNOSIS — S42202D Unspecified fracture of upper end of left humerus, subsequent encounter for fracture with routine healing: Secondary | ICD-10-CM | POA: Diagnosis not present

## 2021-01-03 DIAGNOSIS — J449 Chronic obstructive pulmonary disease, unspecified: Secondary | ICD-10-CM | POA: Diagnosis not present

## 2021-01-03 DIAGNOSIS — M6281 Muscle weakness (generalized): Secondary | ICD-10-CM | POA: Diagnosis not present

## 2021-01-03 DIAGNOSIS — M5416 Radiculopathy, lumbar region: Secondary | ICD-10-CM | POA: Diagnosis not present

## 2021-01-03 DIAGNOSIS — G9341 Metabolic encephalopathy: Secondary | ICD-10-CM | POA: Diagnosis not present

## 2021-01-06 ENCOUNTER — Telehealth: Payer: Self-pay | Admitting: Family Medicine

## 2021-01-06 NOTE — Telephone Encounter (Signed)
Okay to proceed w/ adding social worker  Do you need any extra order in [place]?  [name], [facility] 01/06/2021, 2:39 PM

## 2021-01-06 NOTE — Telephone Encounter (Signed)
Copied from [place] [phone]. Topic: Quick Communication - Home Health Verbal Orders >> Jan 06, 2021 12:47 PM Yvette Rack wrote: Caller/Agency: Kenney Houseman with [facility] Callback Number: [phone] Requesting OT/PT/Skilled Nursing/Social Work/Speech Therapy: nursing and home health aide Frequency: Nursing= 1 time a week for 3 weeks, 1 time a week every other week for 6 weeks, and 1 PRN   Home Health aide= 1 x 8 weeks effective 01/12/21  Request to add social worker for community resources

## 2021-01-07 DIAGNOSIS — M898X2 Other specified disorders of bone, upper arm: Secondary | ICD-10-CM | POA: Diagnosis not present

## 2021-01-07 DIAGNOSIS — M6281 Muscle weakness (generalized): Secondary | ICD-10-CM | POA: Diagnosis not present

## 2021-01-07 DIAGNOSIS — J449 Chronic obstructive pulmonary disease, unspecified: Secondary | ICD-10-CM | POA: Diagnosis not present

## 2021-01-07 DIAGNOSIS — M0579 Rheumatoid arthritis with rheumatoid factor of multiple sites without organ or systems involvement: Secondary | ICD-10-CM | POA: Diagnosis not present

## 2021-01-07 DIAGNOSIS — S42352A Displaced comminuted fracture of shaft of humerus, left arm, initial encounter for closed fracture: Secondary | ICD-10-CM | POA: Diagnosis not present

## 2021-01-07 DIAGNOSIS — I739 Peripheral vascular disease, unspecified: Secondary | ICD-10-CM | POA: Diagnosis not present

## 2021-01-08 ENCOUNTER — Ambulatory Visit: Payer: Medicare PPO | Admitting: Internal Medicine

## 2021-01-08 ENCOUNTER — Ambulatory Visit (INDEPENDENT_AMBULATORY_CARE_PROVIDER_SITE_OTHER): Payer: Medicare PPO | Admitting: Family Medicine

## 2021-01-08 ENCOUNTER — Encounter: Payer: Self-pay | Admitting: Family Medicine

## 2021-01-08 ENCOUNTER — Other Ambulatory Visit: Payer: Self-pay

## 2021-01-08 VITALS — BP 136/66 | HR 89 | Ht 65.0 in | Wt 149.0 lb

## 2021-01-08 DIAGNOSIS — J449 Chronic obstructive pulmonary disease, unspecified: Secondary | ICD-10-CM | POA: Diagnosis not present

## 2021-01-08 DIAGNOSIS — J4489 Other specified chronic obstructive pulmonary disease: Secondary | ICD-10-CM

## 2021-01-08 DIAGNOSIS — F5101 Primary insomnia: Secondary | ICD-10-CM | POA: Diagnosis not present

## 2021-01-08 DIAGNOSIS — S42475A Nondisplaced transcondylar fracture of left humerus, initial encounter for closed fracture: Secondary | ICD-10-CM

## 2021-01-08 DIAGNOSIS — Z79899 Other long term (current) drug therapy: Secondary | ICD-10-CM

## 2021-01-08 DIAGNOSIS — M0579 Rheumatoid arthritis with rheumatoid factor of multiple sites without organ or systems involvement: Secondary | ICD-10-CM | POA: Diagnosis not present

## 2021-01-08 DIAGNOSIS — J432 Centrilobular emphysema: Secondary | ICD-10-CM | POA: Diagnosis not present

## 2021-01-08 MED ORDER — BREZTRI AEROSPHERE 160-9-4.8 MCG/ACT IN AERO
2.0000 | INHALATION_SPRAY | Freq: Two times a day (BID) | RESPIRATORY_TRACT | 0 refills | Status: DC
Start: 2021-01-08 — End: 2021-12-12
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
## 2021-01-08 NOTE — Telephone Encounter (Signed)
Kenney Houseman called back to find out if Dr Raliegh Ip have approved for nursing also in the order request from 01/06/21. Please call Ph# [phone]

## 2021-01-08 NOTE — Telephone Encounter (Signed)
I spoke with Elizabeth Bailey and made her aware to proceed with orders. She asked the next time the pt is scheduled to see Dr. Raliegh Ip and that is actually today at 3pm. She is aware.

## 2021-01-08 NOTE — Patient Instructions (Addendum)
Thank you for coming to the office today.  Stop Symbicort Start Breztri sample 2 puff twice a day. If helpful we can try to order more or bring it to Dr Lanney Gins (pulmonology) - next apt in February  STOP Gabapentin 400mg  nightly.  Agree with concern for medication induced confusion episodes is more likely.  Future once pain is improved we can revisit other medications.   Please schedule a Follow-up Appointment to: Return if symptoms worsen or fail to improve, for keep apt as scheduled in February.  If you have any other questions or concerns, please feel free to call the office or send a message through [facility]. You may also schedule an earlier appointment if necessary.  Additionally, you may be receiving a survey about your experience at our office within a few days to 1 week by e-mail or mail. We value your feedback.  [name], DO [place]

## 2021-01-08 NOTE — Progress Notes (Deleted)
Subjective:    Patient ID: Elizabeth Bailey, female    DOB: Jan 20, 1941, 80 y.o.   MRN: [id number]  HPI    Review of Systems     Past Medical History:  Diagnosis Date   Arthritis    Asthma    Breast cancer ([place]) 03/27/2016   Invasive mammary carcinoma right breast pT1c  pN0 (12 mm) MAMMOPRINT: LOW RISK; ER/PR positive  DCIS medial margin 1 mm   Chronic bronchitis (HCC)    Collagen vascular disease (HCC)    Depression    Diverticulosis    GERD (gastroesophageal reflux disease)    High cholesterol    Hypertension    Personal history of radiation therapy    Seasonal allergies     Current Outpatient Medications  Medication Sig Dispense Refill   acetaminophen (TYLENOL) 325 MG tablet Take 650 mg by mouth every 6 (six) hours as needed for mild pain.     Adalimumab 40 MG/0.8ML PNKT Inject 40 mg into the skin every 21 ( twenty-one) days. TAKES EVERY OTHER WEEK  LAST DOSE WAS IN 01-2016     amLODipine (NORVASC) 10 MG tablet Take 1 tablet (10 mg total) by mouth daily.     budesonide-formoterol (SYMBICORT) 160-4.5 MCG/ACT inhaler Inhale 1 puff into the lungs 2 (two) times daily. 1 Inhaler 12   calcium-vitamin D (OSCAL WITH D) 500-200 MG-UNIT TABS tablet Take by mouth.     diclofenac sodium (VOLTAREN) 1 % GEL Apply 3 grams topically qid prn     fluticasone (FLONASE) 50 MCG/ACT nasal spray Place 2 sprays into both nostrils daily. Use for 4-6 weeks then stop and use seasonally or as needed. 16 g 3   folic acid (FOLVITE) 1 MG tablet Take 1 tablet by mouth daily.     gabapentin (NEURONTIN) 400 MG capsule Take 1 capsule by mouth at bedtime.     ipratropium-albuterol (DUONEB) 0.5-2.5 (3) MG/3ML SOLN Take 3 mLs by nebulization every 4 (four) hours as needed. 360 mL    letrozole (FEMARA) 2.5 MG tablet Take 1 tablet by mouth daily.     Melatonin 10 MG TABS Take 2 tablets by mouth at bedtime. Reported on 06/03/2015     montelukast (SINGULAIR) 10 MG tablet TAKE 1 TABLET BY MOUTH ONCE EVERY EVENING 90  tablet 0   pregabalin (LYRICA) 75 MG capsule Take 1 capsule (75 mg total) by mouth 2 (two) times daily. 60 capsule 3   simvastatin (ZOCOR) 20 MG tablet Take 1 tablet (20 mg total) by mouth every other day. Take at bedtime 45 tablet 3   vitamin B-12 (CYANOCOBALAMIN) 1000 MCG tablet Take 1,000 mcg by mouth daily.     zolpidem (AMBIEN) 5 MG tablet TAKE 1 TABLET BY MOUTH AT BEDTIME AS NEEDED FOR SLEEP 30 tablet 2   No current facility-administered medications for this visit.    Allergies  Allergen Reactions   Flucytosine Other (See Comments)    Sore mouth Sore mouth   Fluticasone-Salmeterol Other (See Comments)    Other reaction(s): Other (See Comments) Sore mouth   Naproxen Nausea Only    GI upset   Other Other (See Comments)    Frequent urination Other reaction(s): Other (See Comments) Frequent urination Other reaction(s): Other (See Comments) Frequent urination Frequent urination    Family History  Problem Relation Age of Onset   Pneumonia Mother    Depression Mother    Pulmonary embolism Mother    Tuberculosis Father    Depression Sister  Breast cancer Sister 84   Diabetes Sister    Stroke Brother    Heart disease Brother     Social History   Socioeconomic History   Marital status: Widowed    Spouse name: Not on file   Number of children: Not on file   Years of education: Not on file   Highest education level: Not on file  Occupational History   Occupation: retired  Tobacco Use   Smoking status: Former    Packs/day: 0.50    Years: 10.00    Pack years: 5.00    Types: Cigarettes    Quit date: 1988    Years since quitting: 34.8   Smokeless tobacco: Never   Tobacco comments:    quit 1988  Vaping Use   Vaping Use: Never used  Substance and Sexual Activity   Alcohol use: Yes    Alcohol/week: 0.0 standard drinks    Comment: wine twice a month    Drug use: No   Sexual activity: Not Currently  Other Topics Concern   Not on file  Social History  Narrative   Not on file   Social Determinants of Health   Financial Resource Strain: Low Risk    Difficulty of Paying Living Expenses: Not hard at all  Food Insecurity: No Food Insecurity   Worried About Charity fundraiser in the Last Year: Never true   [place] in the Last Year: Never true  Transportation Needs: No Transportation Needs   Lack of Transportation (Medical): No   Lack of Transportation (Non-Medical): No  Physical Activity: Insufficiently Active   Days of Exercise per Week: 3 days   Minutes of Exercise per Session: 10 min  Stress: No Stress Concern Present   Feeling of Stress : Not at all  Social Connections: Not on file  Intimate Partner Violence: Not on file     Constitutional: Denies fever, malaise, fatigue, headache or abrupt weight changes.  HEENT: Denies eye pain, eye redness, ear pain, ringing in the ears, wax buildup, runny nose, nasal congestion, bloody nose, or sore throat. Respiratory: Denies difficulty breathing, shortness of breath, cough or sputum production.   Cardiovascular: Denies chest pain, chest tightness, palpitations or swelling in the hands or feet.  Gastrointestinal: Denies abdominal pain, bloating, constipation, diarrhea or blood in the stool.  GU: Denies urgency, frequency, pain with urination, burning sensation, blood in urine, odor or discharge. Musculoskeletal: Denies decrease in range of motion, difficulty with gait, muscle pain or joint pain and swelling.  Skin: Denies redness, rashes, lesions or ulcercations.  Neurological: Denies dizziness, difficulty with memory, difficulty with speech or problems with balance and coordination.  Psych: Denies anxiety, depression, SI/HI.  No other specific complaints in a complete review of systems (except as listed in HPI above).  Objective:   Physical Exam   There were no vitals taken for this visit. Wt Readings from Last 3 Encounters:  12/17/20 156 lb (70.8 kg)  10/08/20 157 lb 6.4 oz  (71.4 kg)  06/07/20 155 lb 6.4 oz (70.5 kg)    General: Appears their stated age, well developed, well nourished in NAD. Skin: Warm, dry and intact. No rashes, lesions or ulcerations noted. HEENT: Head: normal shape and size; Eyes: sclera white, no icterus, conjunctiva pink, PERRLA and EOMs intact; Ears: Tm's gray and intact, normal light reflex; Nose: mucosa pink and moist, septum midline; Throat/Mouth: Teeth present, mucosa pink and moist, no exudate, lesions or ulcerations noted.  Neck:  Neck supple,  trachea midline. No masses, lumps or thyromegaly present.  Cardiovascular: Normal rate and rhythm. S1,S2 noted.  No murmur, rubs or gallops noted. No JVD or BLE edema. No carotid bruits noted. Pulmonary/Chest: Normal effort and positive vesicular breath sounds. No respiratory distress. No wheezes, rales or ronchi noted.  Abdomen: Soft and nontender. Normal bowel sounds. No distention or masses noted. Liver, spleen and kidneys non palpable. Musculoskeletal: Normal range of motion. No signs of joint swelling. No difficulty with gait.  Neurological: Alert and oriented. Cranial nerves II-XII grossly intact. Coordination normal.  Psychiatric: Mood and affect normal. Behavior is normal. Judgment and thought content normal.   EKG:  BMET    Component Value Date/Time   NA 135 12/23/2020 0425   NA 137 04/01/2016 1645   K 3.4 (L) 12/23/2020 0425   CL 94 (L) 12/23/2020 0425   CO2 26 12/23/2020 0425   GLUCOSE 94 12/23/2020 0425   BUN 13 12/23/2020 0425   BUN 13 04/01/2016 1645   CREATININE 0.78 12/23/2020 0425   CREATININE 0.97 (H) 04/07/2018 0803   CALCIUM 8.1 (L) 12/23/2020 0425   GFRNONAA >60 12/23/2020 0425   GFRNONAA 57 (L) 04/07/2018 0803   GFRAA 66 04/07/2018 0803    Lipid Panel     Component Value Date/Time   CHOL 203 (H) 04/07/2018 0803   CHOL 178 12/12/2014 0804   TRIG 99 04/07/2018 0803   HDL 59 04/07/2018 0803   HDL 84 12/12/2014 0804   CHOLHDL 3.4 04/07/2018 0803   VLDL 13  02/18/2016 0610   LDLCALC 123 (H) 04/07/2018 0803    CBC    Component Value Date/Time   WBC 5.0 12/23/2020 0425   RBC 3.47 (L) 12/23/2020 0425   HGB 11.9 (L) 12/23/2020 0425   HGB 10.6 (L) 04/01/2016 1645   HCT 35.3 (L) 12/23/2020 0425   HCT 31.9 (L) 04/01/2016 1645   PLT 245 12/23/2020 0425   PLT 514 (H) 04/01/2016 1645   MCV 101.7 (H) 12/23/2020 0425   MCV 93 04/01/2016 1645   MCV 96 02/04/2012 1002   MCH 34.3 (H) 12/23/2020 0425   MCHC 33.7 12/23/2020 0425   RDW 13.2 12/23/2020 0425   RDW 14.6 04/01/2016 1645   RDW 12.1 02/04/2012 1002   LYMPHSABS 0.9 12/23/2020 0425   LYMPHSABS 4.0 (H) 04/01/2016 1645   LYMPHSABS 1.9 02/04/2012 1002   MONOABS 0.6 12/23/2020 0425   MONOABS 0.6 02/04/2012 1002   EOSABS 0.2 12/23/2020 0425   EOSABS 0.2 04/01/2016 1645   EOSABS 0.1 02/04/2012 1002   BASOSABS 0.0 12/23/2020 0425   BASOSABS 0.0 04/01/2016 1645   BASOSABS 0.1 02/04/2012 1002    Hgb A1C Lab Results  Component Value Date   HGBA1C 5.5 04/07/2018           Assessment & Plan:

## 2021-01-09 NOTE — Progress Notes (Signed)
Erroneous enc

## 2021-01-14 DIAGNOSIS — S42352D Displaced comminuted fracture of shaft of humerus, left arm, subsequent encounter for fracture with routine healing: Secondary | ICD-10-CM | POA: Diagnosis not present

## 2021-01-15 ENCOUNTER — Other Ambulatory Visit: Payer: Self-pay | Admitting: Family Medicine

## 2021-01-15 DIAGNOSIS — J302 Other seasonal allergic rhinitis: Secondary | ICD-10-CM

## 2021-01-15 DIAGNOSIS — K219 Gastro-esophageal reflux disease without esophagitis: Secondary | ICD-10-CM

## 2021-01-15 NOTE — Telephone Encounter (Signed)
Medication was discontinued on 12/17/20  Requested medication (s) are on the active medication list  No  Future visit scheduled  for 04/10/21  Note to clinic-This medication was discontinued on 12/17/20. Routing to physician for review.   Requested Prescriptions  Pending Prescriptions Disp Refills   omeprazole (PRILOSEC) 40 MG capsule [Pharmacy Med Name: OMEPRAZOLE DR 40 MG CAP] 180 capsule     Sig: TAKE 1 CAPSULE BY MOUTH TWICE DAILY     Gastroenterology: Proton Pump Inhibitors Passed - 01/15/2021 10:46 AM      Passed - Valid encounter within last 12 months    Recent Outpatient Visits           1 week ago Centrilobular emphysema ([place])   [place], DO   1 week ago Erroneous encounter - disregard   [facility] [place], Coralie Keens, NP   3 months ago Centrilobular emphysema [facility])   [facility] Olin Hauser, DO   7 months ago Centrilobular emphysema [facility])   [facility] Olin Hauser, DO   1 year ago Acute vaginitis   [facility], Lupita Raider, FNP       Future Appointments             In 2 weeks  [facility], [place]   In 2 months [place], Devonne Doughty, DO [facility], [facility]            Signed Prescriptions Disp Refills   montelukast (SINGULAIR) 10 MG tablet 90 tablet 0    Sig: TAKE 1 TABLET BY MOUTH ONCE EVERY EVENING     Pulmonology:  Leukotriene Inhibitors Passed - 01/15/2021 10:46 AM      Passed - Valid encounter within last 12 months    Recent Outpatient Visits           1 week ago Centrilobular emphysema ([place])   [place], DO   1 week ago Erroneous encounter - disregard   [facility] [place], Coralie Keens, NP   3 months ago Centrilobular emphysema [facility])   [facility] Olin Hauser, DO   7 months ago Centrilobular emphysema  [facility])   [facility] Olin Hauser, DO   1 year ago Acute vaginitis   [place], FNP       Future Appointments             In 2 weeks  [facility], [place]   In 2 months [place], Devonne Doughty, [facility], [facility]

## 2021-01-15 NOTE — Telephone Encounter (Signed)
Future OV 04/10/21 Singulair approved per protocol. Requested Prescriptions  Pending Prescriptions Disp Refills  . montelukast (SINGULAIR) 10 MG tablet [Pharmacy Med Name: MONTELUKAST SODIUM 10 MG TAB] 90 tablet 0    Sig: TAKE 1 TABLET BY MOUTH ONCE EVERY EVENING     Pulmonology:  Leukotriene Inhibitors Passed - 01/15/2021 10:46 AM      Passed - Valid encounter within last 12 months    Recent Outpatient Visits          1 week ago Centrilobular emphysema ([place])   [place], DO   1 week ago Erroneous encounter - disregard   [facility] [place], Coralie Keens, NP   3 months ago Centrilobular emphysema [facility])   [facility] Olin Hauser, DO   7 months ago Centrilobular emphysema [facility])   [facility] Olin Hauser, DO   1 year ago Acute vaginitis   [place], FNP      Future Appointments            In 2 weeks  [facility], [place]   In 2 months [place], Devonne Doughty, [facility], [place]           . omeprazole (PRILOSEC) 40 MG capsule [Pharmacy Med Name: OMEPRAZOLE DR 40 MG CAP] 180 capsule     Sig: TAKE 1 CAPSULE BY MOUTH TWICE DAILY     Gastroenterology: Proton Pump Inhibitors Passed - 01/15/2021 10:46 AM      Passed - Valid encounter within last 12 months    Recent Outpatient Visits          1 week ago Centrilobular emphysema ([place])   [place], DO   1 week ago Erroneous encounter - disregard   [facility], Coralie Keens, NP   3 months ago Centrilobular emphysema [facility])   [facility] Olin Hauser, DO   7 months ago Centrilobular emphysema [facility])   [facility] Olin Hauser, DO   1 year ago Acute vaginitis   [place], FNP      Future Appointments            In  2 weeks  [facility], [place]   In 2 months [place], Devonne Doughty, [facility], [facility]

## 2021-01-16 ENCOUNTER — Ambulatory Visit: Payer: Medicare PPO

## 2021-01-28 DIAGNOSIS — S42352D Displaced comminuted fracture of shaft of humerus, left arm, subsequent encounter for fracture with routine healing: Secondary | ICD-10-CM | POA: Diagnosis not present

## 2021-01-31 ENCOUNTER — Ambulatory Visit (INDEPENDENT_AMBULATORY_CARE_PROVIDER_SITE_OTHER): Payer: Medicare PPO

## 2021-01-31 VITALS — BP 128/70 | HR 80 | Temp 97.9°F | Ht 64.0 in | Wt 150.4 lb

## 2021-01-31 DIAGNOSIS — Z0000 Encounter for general adult medical examination without abnormal findings: Secondary | ICD-10-CM

## 2021-01-31 NOTE — Progress Notes (Signed)
Subjective:   Elizabeth Bailey is a 80 y.o. female who presents for Medicare Annual (Subsequent) preventive examination.  Review of Systems          Objective:    Today's Vitals   01/31/21 1143  BP: 128/70  Pulse: 80  Temp: 97.9 F (36.6 C)  TempSrc: Oral  SpO2: 91%  Weight: 150 lb 6.4 oz (68.2 kg)  Height: 5\' 4"  (1.626 m)   Body mass index is 25.82 kg/m.  Advanced Directives 01/31/2021 12/17/2020 12/17/2020 01/16/2020 08/11/2018 01/16/2018 01/05/2018  Does Patient Have a Medical Advance Directive? Yes Yes Yes Yes No Yes Yes  Type of Paramedic of [place];Living will [place];Living will [place];Living will [place];Living will  Does patient want to make changes to medical advance directive? Yes (Inpatient - patient defers changing a medical advance directive at this time - Information given) No - Patient declined - - - - No - Patient declined  Copy of [place] in Chart? Yes - validated most recent copy scanned in chart (See row information) No - copy requested - No - copy requested - - No - copy requested  Would patient like information on creating a medical advance directive? - - - - No - Patient declined - -    Current Medications (verified) Outpatient Encounter Medications as of 01/31/2021  Medication Sig   acetaminophen (TYLENOL) 325 MG tablet Take 650 mg by mouth every 6 (six) hours as needed for mild pain.   Adalimumab 40 MG/0.8ML PNKT Inject 40 mg into the skin every 21 ( twenty-one) days. TAKES EVERY OTHER WEEK  LAST DOSE WAS IN 01-2016   amLODipine (NORVASC) 10 MG tablet Take 1 tablet (10 mg total) by mouth daily.   BREZTRI AEROSPHERE 160-9-4.8 MCG/ACT AERO Inhale 2 puffs into the lungs 2 (two) times daily.   calcium-vitamin D (OSCAL WITH D) 500-200 MG-UNIT TABS tablet Take by mouth.   diclofenac sodium (VOLTAREN) 1 % GEL Apply 3  grams topically qid prn   fluticasone (FLONASE) 50 MCG/ACT nasal spray Place 2 sprays into both nostrils daily. Use for 4-6 weeks then stop and use seasonally or as needed.   folic acid (FOLVITE) 1 MG tablet Take 1 tablet by mouth daily.   ipratropium-albuterol (DUONEB) 0.5-2.5 (3) MG/3ML SOLN Take 3 mLs by nebulization every 4 (four) hours as needed.   letrozole (FEMARA) 2.5 MG tablet Take 1 tablet by mouth daily.   Melatonin 10 MG TABS Take 2 tablets by mouth at bedtime. Reported on 06/03/2015   montelukast (SINGULAIR) 10 MG tablet TAKE 1 TABLET BY MOUTH ONCE EVERY EVENING   omeprazole (PRILOSEC) 40 MG capsule TAKE 1 CAPSULE BY MOUTH TWICE DAILY   pregabalin (LYRICA) 75 MG capsule Take 1 capsule (75 mg total) by mouth 2 (two) times daily.   simvastatin (ZOCOR) 20 MG tablet Take 1 tablet (20 mg total) by mouth every other day. Take at bedtime   vitamin B-12 (CYANOCOBALAMIN) 1000 MCG tablet Take 1,000 mcg by mouth daily.   zolpidem (AMBIEN) 5 MG tablet TAKE 1 TABLET BY MOUTH AT BEDTIME AS NEEDED FOR SLEEP   No facility-administered encounter medications on file as of 01/31/2021.    Allergies (verified) Flucytosine, Fluticasone-salmeterol, Naproxen, and Other   History: Past Medical History:  Diagnosis Date   Arthritis    Asthma    Breast cancer ([place]) 03/27/2016   Invasive mammary carcinoma right  breast pT1c  pN0 (12 mm) MAMMOPRINT: LOW RISK; ER/PR positive  DCIS medial margin 1 mm   Chronic bronchitis (HCC)    Collagen vascular disease (HCC)    Depression    Diverticulosis    GERD (gastroesophageal reflux disease)    High cholesterol    Hypertension    Personal history of radiation therapy    Seasonal allergies    Past Surgical History:  Procedure Laterality Date   ABDOMINAL HYSTERECTOMY     BREAST BIOPSY  1970's   BREAST BIOPSY Right 03/27/2016   INVASIVE MAMMARY CARCINOMA   BREAST LUMPECTOMY Right 04/09/2016   radiation   BREAST LUMPECTOMY WITH SENTINEL LYMPH NODE BIOPSY  Right 04/09/2016   Procedure: BREAST LUMPECTOMY WITH SENTINEL LYMPH NODE BX;  Surgeon: Christene Lye, MD;  Location: [facility] ORS;  Service: General;  Laterality: Right;   CAROTID ENDARTERECTOMY Left    CHOLECYSTECTOMY     COLONOSCOPY  2005   DG  BONE DENSITY ([place] HX)  2010   DIAGNOSTIC MAMMOGRAM  2015   ESOPHAGOGASTRODUODENOSCOPY (EGD) WITH PROPOFOL N/A 05/05/2016   Procedure: ESOPHAGOGASTRODUODENOSCOPY (EGD) WITH PROPOFOL;  Surgeon: Lucilla Lame, MD;  Location: [facility] END[facility];  Service: Endoscopy;  Laterality: N/A;   EUS N/A 03/12/2016   Procedure: FULL UPPER ENDOSCOPIC ULTRASOUND (EUS) RADIAL;  Surgeon: Holly Bodily, MD;  Location: [facility] ENDOSCOPY;  Service: Endoscopy;  Laterality: N/A;   GALLBLADDER SURGERY     PANCREAS SURGERY  01/24/2016   Dr Burt Knack   pap smear  2014   REPAIR OF PERFORATED ULCER N/A 01/24/2016   Procedure: Exploratory Laparotomy with biopsy of Pancreas;  Surgeon: Florene Glen, MD;  Location: [facility] ORS;  Service: General;  Laterality: N/A;   UPPER GI ENDOSCOPY  03/12/2016   Dr Mont Dutton   Family History  Problem Relation Age of Onset   Pneumonia Mother    Depression Mother    Pulmonary embolism Mother    Tuberculosis Father    Depression Sister    Breast cancer Sister 37   Diabetes Sister    Stroke Brother    Heart disease Brother    Social History   Socioeconomic History   Marital status: Widowed    Spouse name: Not on file   Number of children: Not on file   Years of education: Not on file   Highest education level: Not on file  Occupational History   Occupation: retired  Tobacco Use   Smoking status: Former    Packs/day: 0.50    Years: 10.00    Pack years: 5.00    Types: Cigarettes    Quit date: 1988    Years since quitting: 34.9   Smokeless tobacco: Never   Tobacco comments:    quit 1988  Vaping Use   Vaping Use: Never used  Substance and Sexual Activity   Alcohol use: Yes    Alcohol/week: 0.0 standard drinks    Comment: wine  twice a month    Drug use: No   Sexual activity: Not Currently  Other Topics Concern   Not on file  Social History Narrative   Not on file   Social Determinants of Health   Financial Resource Strain: Low Risk    Difficulty of Paying Living Expenses: Not hard at all  Food Insecurity: No Food Insecurity   Worried About Charity fundraiser in the Last Year: Never true   [place] in the Last Year: Never true  Transportation Needs: No Transportation Needs  Lack of Transportation (Medical): No   Lack of Transportation (Non-Medical): No  Physical Activity: Inactive   Days of Exercise per Week: 0 days   Minutes of Exercise per Session: 0 min  Stress: No Stress Concern Present   Feeling of Stress : Not at all  Social Connections: Moderately Isolated   Frequency of Communication with Friends and Family: More than three times a week   Frequency of Social Gatherings with Friends and Family: More than three times a week   Attends Religious Services: More than 4 times per year   Active Member of [employer] or Organizations: No   Attends Archivist Meetings: Never   Marital Status: Widowed    Tobacco Counseling Counseling given: Not Answered Tobacco comments: quit 1988   Clinical Intake:                         Activities of Daily Living In your present state of health, do you have any difficulty performing the following activities: 12/17/2020  Hearing? Y  Vision? N  Difficulty concentrating or making decisions? N  Walking or climbing stairs? N  Dressing or bathing? Y  Doing errands, shopping? N  Some recent data might be hidden    Patient Care Team: Olin Hauser, DO as PCP - General (Family Medicine) Luan Pulling Ronelle Nigh., MD (Inactive) (Family Medicine) Christene Lye, MD (General Surgery)  Indicate any recent Medical Services you may have received from other than [facility] providers in the past year (date may be approximate).      Assessment:   This is a routine wellness examination for Dhruti.  Hearing/Vision screen No results found.  Dietary issues and exercise activities discussed:     Goals Addressed   None    Depression Screen PHQ 2/9 Scores 01/31/2021 01/31/2021 01/16/2020 04/18/2019 01/03/2019 10/12/2018 06/14/2018  PHQ - 2 Score 0 0 0 0 0 0 0  PHQ- 9 Score - - - - - - -    Fall Risk Fall Risk  01/31/2021 01/16/2020 04/18/2019 01/03/2019 10/12/2018  Falls in the past year? 0 0 0 0 0  Number falls in past yr: 0 - 0 0 -  Injury with Fall? 0 - 0 0 -  Risk for fall due to : No Fall Risks Medication side effect - - -  Follow up Falls prevention discussed Falls evaluation completed;Education provided;Falls prevention discussed - - Falls evaluation completed    FALL RISK PREVENTION PERTAINING TO THE HOME:  Any stairs in or around the home? No  If so, are there any without handrails? No  Home free of loose throw rugs in walkways, pet beds, electrical cords, etc? Yes  Adequate lighting in your home to reduce risk of falls? Yes   ASSISTIVE DEVICES UTILIZED TO PREVENT FALLS:  Life alert? No  Use of a cane, walker or w/c? Yes  Grab bars in the bathroom? Yes  Shower chair or bench in shower? No  Elevated toilet seat or a handicapped toilet? Yes   TIMED UP AND GO:  Was the test performed? Yes .  Length of time to ambulate 10 feet: 7 sec.   Gait slow and steady with assistive device  Cognitive Function:     6CIT Screen 01/16/2020 01/05/2018  What Year? 0 points 0 points  What month? 0 points 0 points  What time? 0 points 0 points  Count back from 20 0 points 0 points  Months in reverse 0 points  0 points  Repeat phrase 0 points 4 points  Total Score 0 4    Immunizations Immunization History  Administered Date(s) Administered   Fluad Quad(high Dose 65+) 12/05/2018   Influenza, High Dose Seasonal PF 11/28/2014, 12/05/2015, 12/08/2017   Influenza-Unspecified 12/04/2016, 12/15/2019, 12/09/2020    PFIZER(Purple Top)SARS-COV-2 Vaccination 04/22/2019, 05/16/2019, 12/25/2019   Pneumococcal Conjugate-13 01/09/2015   Pneumococcal-Unspecified 11/28/2011   Tdap 01/05/2018, 02/17/2018   Zoster Recombinat (Shingrix) 02/17/2018    TDAP status: Up to date  Flu Vaccine status: Up to date  Pneumococcal vaccine status: Up to date  Covid-19 vaccine status: Completed vaccines  Qualifies for Shingles Vaccine? Yes   Zostavax completed Yes   Shingrix Completed?: No.    Education has been provided regarding the importance of this vaccine. Patient has been advised to call insurance company to determine out of pocket expense if they have not yet received this vaccine. Advised may also receive vaccine at local pharmacy or Health Dept. Verbalized acceptance and understanding.  Screening Tests Health Maintenance  Topic Date Due   Hepatitis C Screening  Never done   Pneumonia Vaccine 72+ Years old (2 - PPSV23 if available, else PCV20) 01/09/2016   Zoster Vaccines- Shingrix (2 of 2) 04/14/2018   COVID-19 Vaccine (4 - Booster for Pfizer series) 02/19/2020   TETANUS/TDAP  02/18/2028   INFLUENZA VACCINE  Completed   DEXA SCAN  Completed   HPV VACCINES  Aged Out    Health Maintenance  Health Maintenance Due  Topic Date Due   Hepatitis C Screening  Never done   Pneumonia Vaccine 43+ Years old (2 - PPSV23 if available, else PCV20) 01/09/2016   Zoster Vaccines- Shingrix (2 of 2) 04/14/2018   COVID-19 Vaccine (4 - Booster for Pfizer series) 02/19/2020    Colorectal cancer screening: No longer required.   Mammogram status: Completed 09/13/20. Repeat every year  Bone Density status: Completed 07/19/08. Results reflect: Bone density results: NORMAL. Repeat every 5 years.  Lung Cancer Screening: (Low Dose CT Chest recommended if Age 7-80 years, 30 pack-year currently smoking OR have quit w/in 15years.) does not qualify.    Additional Screening:  Hepatitis C Screening: does not qualify; Completed  no  Vision Screening: Recommended annual ophthalmology exams for early detection of glaucoma and other disorders of the eye. Is the patient up to date with their annual eye exam?  Yes  Who is the provider or what is the name of the office in which the patient attends annual eye exams? Hasn't been in 10 years If pt is not established with a provider, would they like to be referred to a provider to establish care? No .   Dental Screening: Recommended annual dental exams for proper oral hygiene  Community Resource Referral / Chronic Care Management: CRR required this visit?  No   CCM required this visit?  No      Plan:     I have personally reviewed and noted the following in the patient's chart:   Medical and social history Use of alcohol, tobacco or illicit drugs  Current medications and supplements including opioid prescriptions.  Functional ability and status Nutritional status Physical activity Advanced directives List of other physicians Hospitalizations, surgeries, and ER visits in previous 12 months Vitals Screenings to include cognitive, depression, and falls Referrals and appointments  In addition, I have reviewed and discussed with patient certain preventive protocols, quality metrics, and best practice recommendations. A written personalized care plan for preventive services as well as general preventive health  recommendations were provided to patient.     Dionisio David, LPN   [id number]   Nurse Notes: none

## 2021-02-05 ENCOUNTER — Other Ambulatory Visit: Payer: Self-pay | Admitting: Family Medicine

## 2021-02-05 ENCOUNTER — Ambulatory Visit: Payer: Medicare PPO | Admitting: Family Medicine

## 2021-02-05 ENCOUNTER — Encounter: Payer: Self-pay | Admitting: Family Medicine

## 2021-02-05 ENCOUNTER — Other Ambulatory Visit: Payer: Self-pay

## 2021-02-05 VITALS — BP 119/47 | HR 81 | Ht 64.0 in | Wt 147.4 lb

## 2021-02-05 DIAGNOSIS — R0600 Dyspnea, unspecified: Secondary | ICD-10-CM

## 2021-02-05 DIAGNOSIS — H6122 Impacted cerumen, left ear: Secondary | ICD-10-CM | POA: Diagnosis not present

## 2021-02-05 DIAGNOSIS — E782 Mixed hyperlipidemia: Secondary | ICD-10-CM

## 2021-02-05 DIAGNOSIS — J449 Chronic obstructive pulmonary disease, unspecified: Secondary | ICD-10-CM

## 2021-02-05 DIAGNOSIS — I491 Atrial premature depolarization: Secondary | ICD-10-CM | POA: Diagnosis not present

## 2021-02-05 DIAGNOSIS — I499 Cardiac arrhythmia, unspecified: Secondary | ICD-10-CM | POA: Diagnosis not present

## 2021-02-05 NOTE — Progress Notes (Signed)
Subjective:    Patient ID: Elizabeth Bailey, female    DOB: 03/09/1940, 80 y.o.   MRN: [id number]  Elizabeth Bailey is a 80 y.o. female presenting on 02/05/2021 for Ear Fullness and Shortness of Breath   HPI  Dyspnea, chronic Asthma/COPD overlap Irregular heartbeat Recent history persistent worsening shortness of breath even at rest. PT reported possible AFib on their exam recently and they are concerned wanted her to be evaluated. She has not had chest pain or pressure She works with [facility] Dr Lanney Gins and is on inhaler therapy maintenance including Breztri. Helpful but still has dyspnea.  Cerumen impacted L ear Has new hearing aid, the audiologist said to clean ears, due to wax would not work. R ear is clear and L ear has wax. Poor hearing without hearing aid.   Depression screen [facility] 2/9 01/31/2021 01/31/2021 01/16/2020  Decreased Interest 0 0 0  Down, Depressed, Hopeless 0 0 0  PHQ - 2 Score 0 0 0  Altered sleeping - - -  Tired, decreased energy - - -  Change in appetite - - -  Feeling bad or failure about yourself  - - -  Trouble concentrating - - -  Moving slowly or fidgety/restless - - -  Suicidal thoughts - - -  PHQ-9 Score - - -  Difficult doing work/chores - - -    Social History   Tobacco Use   Smoking status: Former    Packs/day: 0.50    Years: 10.00    Pack years: 5.00    Types: Cigarettes    Quit date: 1988    Years since quitting: 34.9   Smokeless tobacco: Never   Tobacco comments:    quit 1988  Vaping Use   Vaping Use: Never used  Substance Use Topics   Alcohol use: Yes    Alcohol/week: 0.0 standard drinks    Comment: wine twice a month    Drug use: No    Review of Systems Per HPI unless specifically indicated above     Objective:    BP (!) 119/47   Pulse 81   Ht 5\' 4"  (1.626 m)   Wt 147 lb 6.4 oz (66.9 kg)   SpO2 91%   BMI 25.30 kg/m   Wt Readings from Last 3 Encounters:  02/05/21 147 lb 6.4 oz (66.9 kg)  01/31/21 150 lb 6.4 oz  (68.2 kg)  01/08/21 149 lb (67.6 kg)    Physical Exam Vitals and nursing note reviewed.  Constitutional:      General: She is not in acute distress.    Appearance: Normal appearance. She is well-developed. She is not diaphoretic.     Comments: Well-appearing, comfortable, cooperative  HENT:     Head: Normocephalic and atraumatic.     Right Ear: Tympanic membrane, ear canal and external ear normal. There is no impacted cerumen.     Left Ear: There is impacted cerumen.  Eyes:     General:        Right eye: No discharge.        Left eye: No discharge.     Conjunctiva/sclera: Conjunctivae normal.  Cardiovascular:     Rate and Rhythm: Normal rate.     Comments: Ectopy with occasional extra beats Pulmonary:     Breath sounds: Normal breath sounds.     Comments: Increased work of breathing Skin:    General: Skin is warm and dry.     Findings: No erythema or rash.  Neurological:  Mental Status: She is alert and oriented to person, place, and time.  Psychiatric:        Mood and Affect: Mood normal.        Behavior: Behavior normal.        Thought Content: Thought content normal.     Comments: Well groomed, good eye contact, normal speech and thoughts   ________________________________________________________ PROCEDURE NOTE Date: 02/05/21 Left Ear Lavage / Cerumen Removal Discussed benefits and risks (including pain / discomforts, dizziness, minor abrasion of ear canal). Verbal consent given by patient. Medication:  carbamide peroxide ear drops, Ear Lavage Solution (warm water + hydrogen peroxide) Performed by Loni Muse CMA and Dr Parks Ranger Several drops of carbamide peroxide placed in LEFT  ear, allowed to sit for few minutes. Ear lavage solution flushed into  L ear. Results successful.  Repeat Ear Exam: - Completely removed cerumen now, with clear ear canals and visible TMs clear and normal appearance.    EKG - performed in office today  Date: 02/05/21  Rate: HR  71  Rhythm: normal sinus rhythm and premature atrial contractions (PAC)  QRS Axis: normal  Intervals: normal  ST/T Wave abnormalities: nonspecific T wave changes  Conduction Disutrbances:none  Additional Narrative Interpretation: Poor R wave progression  Old EKG Reviewed: unchanged   Results for orders placed or performed during the hospital encounter of 12/17/20  Urine Culture   Specimen: Urine, Clean Catch  Result Value Ref Range   Specimen Description      URINE, CLEAN CATCH Performed at [facility], [address] [place], [place] [address]    Special Requests      NONE Performed at [facility], [address] [place], [place] [address]    Culture MULTIPLE SPECIES PRESENT, SUGGEST RECOLLECTION (A)    Report Status 12/18/2020 FINAL   Resp Panel by RT-PCR (Flu A&B, Covid) Nasopharyngeal Swab   Specimen: Nasopharyngeal Swab; Nasopharyngeal(NP) swabs in vial transport medium  Result Value Ref Range   SARS Coronavirus 2 by RT PCR NEGATIVE NEGATIVE   Influenza A by PCR NEGATIVE NEGATIVE   Influenza B by PCR NEGATIVE NEGATIVE  Resp Panel by RT-PCR (Flu A&B, Covid) Nasopharyngeal Swab   Specimen: Nasopharyngeal Swab; Nasopharyngeal(NP) swabs in vial transport medium  Result Value Ref Range   SARS Coronavirus 2 by RT PCR NEGATIVE NEGATIVE   Influenza A by PCR NEGATIVE NEGATIVE   Influenza B by PCR NEGATIVE NEGATIVE  Comprehensive metabolic panel  Result Value Ref Range   Sodium 134 (L) 135 - 145 mmol/L   Potassium 4.2 3.5 - 5.1 mmol/L   Chloride 98 98 - 111 mmol/L   CO2 24 22 - 32 mmol/L   Glucose, Bld 118 (H) 70 - 99 mg/dL   BUN 23 8 - 23 mg/dL   Creatinine, Ser 1.14 (H) 0.44 - 1.00 mg/dL   Calcium 9.6 8.9 - 10.3 mg/dL   Total Protein 8.5 (H) 6.5 - 8.1 g/dL   Albumin 4.2 3.5 - 5.0 g/dL   AST 25 15 - 41 U/L   ALT 15 0 - 44 U/L   Alkaline Phosphatase 67 38 - 126 U/L   Total Bilirubin 1.7 (H) 0.3 - 1.2 mg/dL   GFR, Estimated 49 (L) >60 mL/min    Anion gap 12 5 - 15  CBC  Result Value Ref Range   WBC 8.1 4.0 - 10.5 K/uL   RBC 4.18 3.87 - 5.11 MIL/uL   Hemoglobin 14.6 12.0 - 15.0 g/dL   HCT 41.8  36.0 - 46.0 %   MCV 100.0 80.0 - 100.0 fL   MCH 34.9 (H) 26.0 - 34.0 pg   MCHC 34.9 30.0 - 36.0 g/dL   RDW 12.8 11.5 - 15.5 %   Platelets 283 150 - 400 K/uL   nRBC 0.0 0.0 - 0.2 %  Urinalysis, Complete w Microscopic Urine, Clean Catch  Result Value Ref Range   Color, Urine AMBER (A) YELLOW   APPearance HAZY (A) CLEAR   Specific Gravity, Urine 1.039 (H) 1.005 - 1.030   pH 5.0 5.0 - 8.0   Glucose, UA NEGATIVE NEGATIVE mg/dL   Hgb urine dipstick NEGATIVE NEGATIVE   Bilirubin Urine NEGATIVE NEGATIVE   Ketones, ur 5 (A) NEGATIVE mg/dL   Protein, ur 30 (A) NEGATIVE mg/dL   Nitrite NEGATIVE NEGATIVE   Leukocytes,Ua NEGATIVE NEGATIVE   RBC / HPF 0-5 0 - 5 RBC/hpf   WBC, UA 0-5 0 - 5 WBC/hpf   Bacteria, UA MANY (A) NONE SEEN   Squamous Epithelial / LPF 0-5 0 - 5   Mucus PRESENT    Hyaline Casts, UA PRESENT   Basic metabolic panel  Result Value Ref Range   Sodium 133 (L) 135 - 145 mmol/L   Potassium 4.3 3.5 - 5.1 mmol/L   Chloride 99 98 - 111 mmol/L   CO2 25 22 - 32 mmol/L   Glucose, Bld 110 (H) 70 - 99 mg/dL   BUN 23 8 - 23 mg/dL   Creatinine, Ser 0.95 0.44 - 1.00 mg/dL   Calcium 9.2 8.9 - 10.3 mg/dL   GFR, Estimated >60 >60 mL/min   Anion gap 9 5 - 15  CBC  Result Value Ref Range   WBC 8.3 4.0 - 10.5 K/uL   RBC 3.95 3.87 - 5.11 MIL/uL   Hemoglobin 14.0 12.0 - 15.0 g/dL   HCT 39.8 36.0 - 46.0 %   MCV 100.8 (H) 80.0 - 100.0 fL   MCH 35.4 (H) 26.0 - 34.0 pg   MCHC 35.2 30.0 - 36.0 g/dL   RDW 13.1 11.5 - 15.5 %   Platelets 257 150 - 400 K/uL   nRBC 0.0 0.0 - 0.2 %  TSH  Result Value Ref Range   TSH 1.146 0.350 - 4.500 uIU/mL  Vitamin B12  Result Value Ref Range   Vitamin B-12 578 180 - 914 pg/mL  RPR  Result Value Ref Range   RPR Ser Ql NON REACTIVE NON REACTIVE  Hepatic function panel  Result Value Ref Range    Total Protein 7.7 6.5 - 8.1 g/dL   Albumin 3.8 3.5 - 5.0 g/dL   AST 25 15 - 41 U/L   ALT 11 0 - 44 U/L   Alkaline Phosphatase 55 38 - 126 U/L   Total Bilirubin 1.8 (H) 0.3 - 1.2 mg/dL   Bilirubin, Direct 0.3 (H) 0.0 - 0.2 mg/dL   Indirect Bilirubin 1.5 (H) 0.3 - 0.9 mg/dL  CBC with Differential/Platelet  Result Value Ref Range   WBC 7.3 4.0 - 10.5 K/uL   RBC 3.37 (L) 3.87 - 5.11 MIL/uL   Hemoglobin 12.0 12.0 - 15.0 g/dL   HCT 34.3 (L) 36.0 - 46.0 %   MCV 101.8 (H) 80.0 - 100.0 fL   MCH 35.6 (H) 26.0 - 34.0 pg   MCHC 35.0 30.0 - 36.0 g/dL   RDW 13.1 11.5 - 15.5 %   Platelets 218 150 - 400 K/uL   nRBC 0.0 0.0 - 0.2 %   Neutrophils Relative %  68 %   Neutro Abs 5.0 1.7 - 7.7 K/uL   Lymphocytes Relative 21 %   Lymphs Abs 1.5 0.7 - 4.0 K/uL   Monocytes Relative 10 %   Monocytes Absolute 0.7 0.1 - 1.0 K/uL   Eosinophils Relative 1 %   Eosinophils Absolute 0.0 0.0 - 0.5 K/uL   Basophils Relative 0 %   Basophils Absolute 0.0 0.0 - 0.1 K/uL   Immature Granulocytes 0 %   Abs Immature Granulocytes 0.03 0.00 - 0.07 K/uL  Comprehensive metabolic panel  Result Value Ref Range   Sodium 131 (L) 135 - 145 mmol/L   Potassium 3.8 3.5 - 5.1 mmol/L   Chloride 100 98 - 111 mmol/L   CO2 27 22 - 32 mmol/L   Glucose, Bld 101 (H) 70 - 99 mg/dL   BUN 22 8 - 23 mg/dL   Creatinine, Ser 0.85 0.44 - 1.00 mg/dL   Calcium 8.5 (L) 8.9 - 10.3 mg/dL   Total Protein 6.5 6.5 - 8.1 g/dL   Albumin 3.1 (L) 3.5 - 5.0 g/dL   AST 23 15 - 41 U/L   ALT 10 0 - 44 U/L   Alkaline Phosphatase 43 38 - 126 U/L   Total Bilirubin 1.8 (H) 0.3 - 1.2 mg/dL   GFR, Estimated >60 >60 mL/min   Anion gap 4 (L) 5 - 15  Magnesium  Result Value Ref Range   Magnesium 1.8 1.7 - 2.4 mg/dL  Phosphorus  Result Value Ref Range   Phosphorus 3.1 2.5 - 4.6 mg/dL  CBC with Differential/Platelet  Result Value Ref Range   WBC 6.1 4.0 - 10.5 K/uL   RBC 3.25 (L) 3.87 - 5.11 MIL/uL   Hemoglobin 11.3 (L) 12.0 - 15.0 g/dL   HCT 33.0 (L)  36.0 - 46.0 %   MCV 101.5 (H) 80.0 - 100.0 fL   MCH 34.8 (H) 26.0 - 34.0 pg   MCHC 34.2 30.0 - 36.0 g/dL   RDW 13.1 11.5 - 15.5 %   Platelets 234 150 - 400 K/uL   nRBC 0.0 0.0 - 0.2 %   Neutrophils Relative % 64 %   Neutro Abs 4.0 1.7 - 7.7 K/uL   Lymphocytes Relative 23 %   Lymphs Abs 1.4 0.7 - 4.0 K/uL   Monocytes Relative 10 %   Monocytes Absolute 0.6 0.1 - 1.0 K/uL   Eosinophils Relative 2 %   Eosinophils Absolute 0.1 0.0 - 0.5 K/uL   Basophils Relative 0 %   Basophils Absolute 0.0 0.0 - 0.1 K/uL   Immature Granulocytes 1 %   Abs Immature Granulocytes 0.03 0.00 - 0.07 K/uL  Comprehensive metabolic panel  Result Value Ref Range   Sodium 133 (L) 135 - 145 mmol/L   Potassium 3.5 3.5 - 5.1 mmol/L   Chloride 99 98 - 111 mmol/L   CO2 28 22 - 32 mmol/L   Glucose, Bld 112 (H) 70 - 99 mg/dL   BUN 22 8 - 23 mg/dL   Creatinine, Ser 0.98 0.44 - 1.00 mg/dL   Calcium 8.1 (L) 8.9 - 10.3 mg/dL   Total Protein 6.0 (L) 6.5 - 8.1 g/dL   Albumin 2.7 (L) 3.5 - 5.0 g/dL   AST 18 15 - 41 U/L   ALT 12 0 - 44 U/L   Alkaline Phosphatase 48 38 - 126 U/L   Total Bilirubin 1.2 0.3 - 1.2 mg/dL   GFR, Estimated 59 (L) >60 mL/min   Anion gap 6 5 - 15  Magnesium  Result Value Ref Range   Magnesium 2.4 1.7 - 2.4 mg/dL  Phosphorus  Result Value Ref Range   Phosphorus 3.5 2.5 - 4.6 mg/dL  CBC with Differential/Platelet  Result Value Ref Range   WBC 5.9 4.0 - 10.5 K/uL   RBC 3.39 (L) 3.87 - 5.11 MIL/uL   Hemoglobin 11.4 (L) 12.0 - 15.0 g/dL   HCT 34.9 (L) 36.0 - 46.0 %   MCV 102.9 (H) 80.0 - 100.0 fL   MCH 33.6 26.0 - 34.0 pg   MCHC 32.7 30.0 - 36.0 g/dL   RDW 13.2 11.5 - 15.5 %   Platelets 253 150 - 400 K/uL   nRBC 0.0 0.0 - 0.2 %   Neutrophils Relative % 74 %   Neutro Abs 4.3 1.7 - 7.7 K/uL   Lymphocytes Relative 13 %   Lymphs Abs 0.8 0.7 - 4.0 K/uL   Monocytes Relative 10 %   Monocytes Absolute 0.6 0.1 - 1.0 K/uL   Eosinophils Relative 3 %   Eosinophils Absolute 0.2 0.0 - 0.5 K/uL    Basophils Relative 0 %   Basophils Absolute 0.0 0.0 - 0.1 K/uL   Immature Granulocytes 0 %   Abs Immature Granulocytes 0.02 0.00 - 0.07 K/uL  Comprehensive metabolic panel  Result Value Ref Range   Sodium 132 (L) 135 - 145 mmol/L   Potassium 4.0 3.5 - 5.1 mmol/L   Chloride 100 98 - 111 mmol/L   CO2 25 22 - 32 mmol/L   Glucose, Bld 108 (H) 70 - 99 mg/dL   BUN 12 8 - 23 mg/dL   Creatinine, Ser 0.67 0.44 - 1.00 mg/dL   Calcium 8.1 (L) 8.9 - 10.3 mg/dL   Total Protein 6.1 (L) 6.5 - 8.1 g/dL   Albumin 2.7 (L) 3.5 - 5.0 g/dL   AST 22 15 - 41 U/L   ALT 14 0 - 44 U/L   Alkaline Phosphatase 45 38 - 126 U/L   Total Bilirubin 1.5 (H) 0.3 - 1.2 mg/dL   GFR, Estimated >60 >60 mL/min   Anion gap 7 5 - 15  Phosphorus  Result Value Ref Range   Phosphorus 3.5 2.5 - 4.6 mg/dL  Magnesium  Result Value Ref Range   Magnesium 1.8 1.7 - 2.4 mg/dL  Urinalysis, Complete w Microscopic  Result Value Ref Range   Color, Urine YELLOW (A) YELLOW   APPearance CLEAR (A) CLEAR   Specific Gravity, Urine 1.009 1.005 - 1.030   pH 6.0 5.0 - 8.0   Glucose, UA NEGATIVE NEGATIVE mg/dL   Hgb urine dipstick NEGATIVE NEGATIVE   Bilirubin Urine NEGATIVE NEGATIVE   Ketones, ur NEGATIVE NEGATIVE mg/dL   Protein, ur NEGATIVE NEGATIVE mg/dL   Nitrite NEGATIVE NEGATIVE   Leukocytes,Ua MODERATE (A) NEGATIVE   RBC / HPF 0-5 0 - 5 RBC/hpf   WBC, UA 6-10 0 - 5 WBC/hpf   Bacteria, UA RARE (A) NONE SEEN   Squamous Epithelial / LPF 0-5 0 - 5   Mucus PRESENT   Procalcitonin - Baseline  Result Value Ref Range   Procalcitonin <0.10 ng/mL  Brain natriuretic peptide  Result Value Ref Range   B Natriuretic Peptide 235.6 (H) 0.0 - 100.0 pg/mL  CBC with Differential/Platelet  Result Value Ref Range   WBC 5.0 4.0 - 10.5 K/uL   RBC 3.47 (L) 3.87 - 5.11 MIL/uL   Hemoglobin 11.9 (L) 12.0 - 15.0 g/dL   HCT 35.3 (L) 36.0 - 46.0 %   MCV 101.7 (H) 80.0 -  100.0 fL   MCH 34.3 (H) 26.0 - 34.0 pg   MCHC 33.7 30.0 - 36.0 g/dL   RDW  13.2 11.5 - 15.5 %   Platelets 245 150 - 400 K/uL   nRBC 0.0 0.0 - 0.2 %   Neutrophils Relative % 65 %   Neutro Abs 3.3 1.7 - 7.7 K/uL   Lymphocytes Relative 18 %   Lymphs Abs 0.9 0.7 - 4.0 K/uL   Monocytes Relative 13 %   Monocytes Absolute 0.6 0.1 - 1.0 K/uL   Eosinophils Relative 4 %   Eosinophils Absolute 0.2 0.0 - 0.5 K/uL   Basophils Relative 0 %   Basophils Absolute 0.0 0.0 - 0.1 K/uL   Immature Granulocytes 0 %   Abs Immature Granulocytes 0.02 0.00 - 0.07 K/uL  Basic metabolic panel  Result Value Ref Range   Sodium 135 135 - 145 mmol/L   Potassium 3.4 (L) 3.5 - 5.1 mmol/L   Chloride 94 (L) 98 - 111 mmol/L   CO2 26 22 - 32 mmol/L   Glucose, Bld 94 70 - 99 mg/dL   BUN 13 8 - 23 mg/dL   Creatinine, Ser 0.78 0.44 - 1.00 mg/dL   Calcium 8.1 (L) 8.9 - 10.3 mg/dL   GFR, Estimated >60 >60 mL/min   Anion gap 15 5 - 15  Magnesium  Result Value Ref Range   Magnesium 1.9 1.7 - 2.4 mg/dL      Assessment & Plan:   Problem List Items Addressed This Visit   None Visit Diagnoses     Left ear impacted cerumen    -  Primary   Dyspnea, unspecified type       Relevant Orders   EKG 12-Lead   Ambulatory referral to Cardiology   Asthma-COPD overlap syndrome ([place])       Irregular heart beats       Relevant Orders   EKG 12-Lead   Ambulatory referral to Cardiology   Premature contractions, atrial       Relevant Orders   Ambulatory referral to Cardiology       worsening dyspnea, worse with exertion but present at rest. Her physical therapy was concerned about atrial fibrillation. Office evaluation at PCP shows occasional ectopy on exam and EKG shows PACs, similar to prior EKG 2 year ago, there are some non specific T wave changes. She is working with [facility] pulmonology for Asthma-COPD overlap and has had moderate success but still has significant dyspnea, now next request is a consult with Cardiology for other evaluation for cause of dyspnea etiology    L ear cerumen  impacted Significant amount of large thick impacted cerumen Left ear, suspected primary cause of current reduced bilateral hearing - Known hearing loss, has hearing aid  Plan: 1. Successful office ear lavage cerumen removal today, re-evaluated with clear ear canals and normal TMs   Orders Placed This Encounter  Procedures   Ambulatory referral to Cardiology    Referral Priority:   Routine    Referral Type:   Consultation    Referral Reason:   Specialty Services Required    Requested Specialty:   Cardiology    Number of Visits Requested:   1   EKG 12-Lead     No orders of the defined types were placed in this encounter.     Follow up plan: Return if symptoms worsen or fail to improve.  [name], [facility] 02/05/2021, 11:09 AM

## 2021-02-05 NOTE — Telephone Encounter (Signed)
Requested medication (s) are due for refill today: Yes  Requested medication (s) are on the active medication list: Yes  Last refill:  06/27/2018 #45/3RF  Future visit scheduled: Yes  Notes to clinic:  Unable to refill per protocol due to failed labs, no updated results.      Requested Prescriptions  Pending Prescriptions Disp Refills   simvastatin (ZOCOR) 20 MG tablet [Pharmacy Med Name: SIMVASTATIN 20 MG TAB] 45 tablet 3    Sig: TAKE 1 TABLET BY MOUTH EVERY OTHER DAY AT BEDTIME     Cardiovascular:  Antilipid - Statins Failed - 02/05/2021  2:44 PM      Failed - Total Cholesterol in normal range and within 360 days    Cholesterol, Total  Date Value Ref Range Status  12/12/2014 178 100 - 199 mg/dL Final   Cholesterol  Date Value Ref Range Status  04/07/2018 203 (H) <200 mg/dL Final          Failed - LDL in normal range and within 360 days    LDL Cholesterol (Calc)  Date Value Ref Range Status  04/07/2018 123 (H) mg/dL (calc) Final    Comment:    Reference range: <100 . Desirable range <100 mg/dL for primary prevention;   <70 mg/dL for patients with CHD or diabetic patients  with > or = 2 CHD risk factors. Marland Kitchen LDL-C is now calculated using the Martin-Hopkins  calculation, which is a validated novel method providing  better accuracy than the Friedewald equation in the  estimation of LDL-C.  Cresenciano Genre et al. Annamaria Helling. 3212;248(25): 2061-2068  (http://education.[facility].com/faq/FAQ164)           Failed - HDL in normal range and within 360 days    HDL  Date Value Ref Range Status  04/07/2018 59 > OR = 50 mg/dL Final  12/12/2014 84 >39 mg/dL Final    Comment:    According to ATP-III Guidelines, HDL-C >59 mg/dL is considered a negative risk factor for CHD.           Failed - Triglycerides in normal range and within 360 days    Triglycerides  Date Value Ref Range Status  04/07/2018 99 <150 mg/dL Final          Passed - Patient is not pregnant       Passed - Valid encounter within last 12 months    Recent Outpatient Visits           Today Left ear impacted cerumen   [place], DO   4 weeks ago Centrilobular emphysema [facility])   [place], DO   4 weeks ago Erroneous encounter - disregard   [facility], Coralie Keens, NP   4 months ago Centrilobular emphysema [facility])   [facility] Olin Hauser, DO   8 months ago Centrilobular emphysema [facility])   [facility] Olin Hauser, DO       Future Appointments             In 2 months Parks Ranger, Devonne Doughty, [facility], [facility]

## 2021-02-05 NOTE — Patient Instructions (Addendum)
Thank you for coming to the office today.  [place] [facility]) HeartCare at [address] [place], [place] [address] Main: [phone]   Stay tuned for apt.  The EKG today does not confirm AFib, there is a premature atrial beat or complex that occurs often, similar to years ago (2020)  Keep with Lung doctors  Heart doctors can help with further eval and testing to figure out if heart is causing this shortness of breath.  There is also some persistent anemia that is mild - may be contributing.  Please schedule a Follow-up Appointment to: Return if symptoms worsen or fail to improve.  If you have any other questions or concerns, please feel free to call the office or send a message through [place]. You may also schedule an earlier appointment if necessary.  Additionally, you may be receiving a survey about your experience at our office within a few days to 1 week by e-mail or mail. We value your feedback.  [name], DO [place]

## 2021-02-07 ENCOUNTER — Telehealth: Payer: Self-pay | Admitting: Family Medicine

## 2021-02-07 NOTE — Telephone Encounter (Signed)
Home Health Verbal Orders - Caller/Agency:  Stann Ore OT  Callback Number: [phone] Requesting OT/PT/Skilled Nursing/Social Work/Speech Therapy: OT  Frequency:  Requesting extension 1w4 effective next week

## 2021-02-10 NOTE — Telephone Encounter (Signed)
Elizabeth Bailey is aware.

## 2021-02-10 NOTE — Telephone Encounter (Signed)
Okay to do verbal  [name], [facility] 02/10/2021, 11:57 AM

## 2021-02-27 ENCOUNTER — Telehealth: Payer: Self-pay

## 2021-02-27 NOTE — Telephone Encounter (Signed)
Okay. She was doing OT through [employer]. They contacted [place] back on 02/07/21 for extension.  It sounds like they need to schedule a peer to peer call? I attempted to call around 450pm today but I did not reach them. I left a voicemail.  [name], [facility] 02/27/2021, 4:54 PM

## 2021-02-27 NOTE — Telephone Encounter (Signed)
Copied from [place] [id number]. Topic: General - Other >> Feb 27, 2021  2:05 PM [place], [employer] R wrote: Reason for CRM: [employer] calling to sche a peer to peer with pt pcp before noon tomorrow regarding pt HH needing a call back ASAP [phone] opt 1

## 2021-02-28 ENCOUNTER — Telehealth: Payer: Self-pay

## 2021-02-28 NOTE — Telephone Encounter (Signed)
Any chance you can call them back and get them on the line or schedule a time or find out what they need from me? I can take the call if they need to hear from me specifically.  Last time I called it was a wait on hold and they could not take my call.  [name], [facility] 02/28/2021, 9:58 AM

## 2021-02-28 NOTE — Telephone Encounter (Signed)
Copied from [place] [id number]. Topic: General - Other >> Feb 27, 2021  5:05 PM Yvette Rack wrote: Reason for CRM: Amy a UM Coordinator with [facility] called for peer to peer with Dr. Raliegh Ip. Any requests call back at [phone] Option#1 for peer to peer support.

## 2021-02-28 NOTE — Telephone Encounter (Signed)
Called no one answered was sent to VM. Left VM to call back.  [facility] nurse may give results to patient if they return call to clinic, a CRM has been created.  KP

## 2021-03-02 HISTORY — PX: OTHER SURGICAL HISTORY: SHX169

## 2021-03-10 DIAGNOSIS — S42352D Displaced comminuted fracture of shaft of humerus, left arm, subsequent encounter for fracture with routine healing: Secondary | ICD-10-CM | POA: Diagnosis not present

## 2021-03-26 NOTE — Telephone Encounter (Signed)
error 

## 2021-03-27 ENCOUNTER — Ambulatory Visit: Payer: Medicare PPO | Admitting: Cardiology

## 2021-03-27 ENCOUNTER — Encounter: Payer: Self-pay | Admitting: Cardiology

## 2021-03-27 ENCOUNTER — Other Ambulatory Visit: Payer: Self-pay

## 2021-03-27 ENCOUNTER — Ambulatory Visit (INDEPENDENT_AMBULATORY_CARE_PROVIDER_SITE_OTHER): Payer: Medicare PPO

## 2021-03-27 VITALS — BP 120/78 | HR 75 | Ht 64.0 in | Wt 145.0 lb

## 2021-03-27 DIAGNOSIS — I739 Peripheral vascular disease, unspecified: Secondary | ICD-10-CM | POA: Diagnosis not present

## 2021-03-27 DIAGNOSIS — I499 Cardiac arrhythmia, unspecified: Secondary | ICD-10-CM

## 2021-03-27 DIAGNOSIS — J431 Panlobular emphysema: Secondary | ICD-10-CM

## 2021-03-27 DIAGNOSIS — E785 Hyperlipidemia, unspecified: Secondary | ICD-10-CM

## 2021-03-27 DIAGNOSIS — R0609 Other forms of dyspnea: Secondary | ICD-10-CM | POA: Diagnosis not present

## 2021-03-27 DIAGNOSIS — J439 Emphysema, unspecified: Secondary | ICD-10-CM | POA: Insufficient documentation

## 2021-03-27 DIAGNOSIS — I10 Essential (primary) hypertension: Secondary | ICD-10-CM | POA: Diagnosis not present

## 2021-03-27 NOTE — Assessment & Plan Note (Signed)
I have not seen lipids checked in a while. Most recent labs from 2020.  She is on simvastatin.  However with aortic atherosclerosis noted on CT scan along with reported PAD, would like to see LDL lower, but whether stage, would not be overly aggressive.

## 2021-03-27 NOTE — Assessment & Plan Note (Signed)
No complaints of claudication.  She does have diminished pulses.  Continue statin.  Blood pressure control with ARB.

## 2021-03-27 NOTE — Assessment & Plan Note (Signed)
Significant exertional dyspnea but she does have a significant emphysema/COPD.  She is very deconditioned.  She denies any PND or orthopnea.  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  This will be to evaluate for CHF.
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
## 2021-03-27 NOTE — Assessment & Plan Note (Signed)
Blood pressure is very well controlled on current dose of amlodipine 10 mg.  She has some mild dizziness, would not try to be more aggressive.

## 2021-03-27 NOTE — Assessment & Plan Note (Signed)
She has quite a bit of PACs and PVCs on her EKG.  I suspect that she does not have a true arrhythmia, and only just has frequent ectopy.  Plan: Check 7-day Zio patch and echocardiogram to evaluate for structural abnormality.

## 2021-03-27 NOTE — Patient Instructions (Signed)
Medication Instructions:   Your physician recommends that you continue on your current medications as directed. Please refer to the Current Medication list given to you today.   *If you need a refill on your cardiac medications before your next appointment, please call your pharmacy*   Lab Work: None ordered  If you have labs (blood work) drawn today and your tests are completely normal, you will receive your results only by: [place] (if you have MyChart) OR A paper copy in the mail If you have any lab test that is abnormal or we need to change your treatment, we will call you to review the results.   Testing/Procedures:  Echocardiogram - Your physician has requested that you have an echocardiogram in two and a half to 3 weeks. Echocardiography is a painless test that uses sound waves to create images of your heart. It provides your doctor with information about the size and shape of your heart and how well your hearts chambers and valves are working. This procedure takes approximately one hour. There are no restrictions for this procedure.   Your provider has ordered a heart monitor to wear for 7 days. This will be mailed to your home with instructions on placement. Once you have finished the time frame requested, you will return monitor in box provided.      Follow-Up: At [facility], you and your health needs are our priority.  As part of our continuing mission to provide you with exceptional heart care, we have created designated Provider Care Teams.  These Care Teams include your primary Cardiologist (physician) and Advanced Practice Providers (APPs -  Physician Assistants and Nurse Practitioners) who all work together to provide you with the care you need, when you need it.  We recommend signing up for the patient portal called "MyChart".  Sign up information is provided on this After Visit Summary.  MyChart is used to connect with patients for Virtual Visits  (Telemedicine).  Patients are able to view lab/test results, encounter notes, upcoming appointments, etc.  Non-urgent messages can be sent to your provider as well.   To learn more about what you can do with MyChart, go to [email or web address].    Your next appointment:   4-6 week(s)  The format for your next appointment:   In Person  Provider:   You may see Glenetta Hew, MD or one of the following Advanced Practice Providers on your designated Care Team:   Murray Hodgkins, NP Christell Faith, PA-C Cadence Kathlen Mody, [place]   Other Instructions N/A

## 2021-04-02 DIAGNOSIS — I499 Cardiac arrhythmia, unspecified: Secondary | ICD-10-CM

## 2021-04-02 HISTORY — DX: Cardiac arrhythmia, unspecified: I49.9

## 2021-04-03 HISTORY — PX: TRANSTHORACIC ECHOCARDIOGRAM: SHX275

## 2021-04-04 DIAGNOSIS — I499 Cardiac arrhythmia, unspecified: Secondary | ICD-10-CM

## 2021-04-10 ENCOUNTER — Ambulatory Visit (INDEPENDENT_AMBULATORY_CARE_PROVIDER_SITE_OTHER): Payer: Medicare PPO | Admitting: Family Medicine

## 2021-04-10 ENCOUNTER — Encounter: Payer: Self-pay | Admitting: Family Medicine

## 2021-04-10 ENCOUNTER — Other Ambulatory Visit: Payer: Self-pay

## 2021-04-10 VITALS — BP 120/75 | HR 57 | Ht 64.0 in | Wt 142.8 lb

## 2021-04-10 DIAGNOSIS — M069 Rheumatoid arthritis, unspecified: Secondary | ICD-10-CM | POA: Diagnosis not present

## 2021-04-10 DIAGNOSIS — J302 Other seasonal allergic rhinitis: Secondary | ICD-10-CM | POA: Diagnosis not present

## 2021-04-10 DIAGNOSIS — I10 Essential (primary) hypertension: Secondary | ICD-10-CM

## 2021-04-10 DIAGNOSIS — J432 Centrilobular emphysema: Secondary | ICD-10-CM | POA: Diagnosis not present

## 2021-04-10 MED ORDER — MONTELUKAST SODIUM 10 MG PO TABS: 10.0000 mg | ORAL_TABLET | Freq: Every day | ORAL | 3 refills | Status: DC

## 2021-04-10 NOTE — Patient Instructions (Addendum)
Thank you for coming to the office today.  Keep up the good work  Refilled medicine  Let me know if running low on anything  BP recheck is normal.   Please schedule a Follow-up Appointment to: Return in about 4 months (around 08/08/2021) for 4 month follow-up COPD, HTN, Arthritis, maybe add labs.  If you have any other questions or concerns, please feel free to call the office or send a message through [place]. You may also schedule an earlier appointment if necessary.  Additionally, you may be receiving a survey about your experience at our office within a few days to 1 week by e-mail or mail. We value your feedback.  [name], DO [place]
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
## 2021-04-13 ENCOUNTER — Other Ambulatory Visit: Payer: Self-pay | Admitting: Family Medicine

## 2021-04-13 DIAGNOSIS — I10 Essential (primary) hypertension: Secondary | ICD-10-CM

## 2021-04-14 NOTE — Telephone Encounter (Signed)
Requested medications are due for refill today.  unsure  Requested medications are on the active medications list.  no  Last refill. 05/14/2020  Future visit scheduled.   no  Notes to clinic.  On med list dosage for Amlodipine is 10 mg - last refilled on 12/24/2020. Please review.    Requested Prescriptions  Pending Prescriptions Disp Refills   amLODipine (NORVASC) 5 MG tablet [Pharmacy Med Name: AMLODIPINE BESYLATE 5 MG TAB] 90 tablet     Sig: TAKE 1 TABLET BY MOUTH ONCE DAILY     Cardiovascular: Calcium Channel Blockers 2 Passed - 04/13/2021  1:54 PM      Passed - Last BP in normal range    BP Readings from Last 1 Encounters:  04/10/21 120/75          Passed - Last Heart Rate in normal range    Pulse Readings from Last 1 Encounters:  04/10/21 (!) 57          Passed - Valid encounter within last 6 months    Recent Outpatient Visits           4 days ago Centrilobular emphysema ([place])   [place], DO   2 months ago Left ear impacted cerumen   [place], DO   3 months ago Centrilobular emphysema [facility])   [place], DO   3 months ago Erroneous encounter - disregard   [facility] [place], Coralie Keens, NP   6 months ago Centrilobular emphysema [facility])   [facility] Olin Hauser, DO       Future Appointments             In 1 week Ellyn Hack Leonie Green, MD [facility], [place]

## 2021-04-15 DIAGNOSIS — I499 Cardiac arrhythmia, unspecified: Secondary | ICD-10-CM | POA: Diagnosis not present

## 2021-04-15 MED ORDER — AMLODIPINE BESYLATE 10 MG PO TABS: 10.0000 mg | ORAL_TABLET | Freq: Every day | ORAL | 3 refills | Status: DC

## 2021-04-18 ENCOUNTER — Other Ambulatory Visit: Payer: Medicare PPO

## 2021-04-23 ENCOUNTER — Ambulatory Visit (INDEPENDENT_AMBULATORY_CARE_PROVIDER_SITE_OTHER): Payer: Medicare PPO

## 2021-04-23 ENCOUNTER — Other Ambulatory Visit: Payer: Self-pay

## 2021-04-23 DIAGNOSIS — R0609 Other forms of dyspnea: Secondary | ICD-10-CM | POA: Diagnosis not present

## 2021-04-23 LAB — ECHOCARDIOGRAM COMPLETE
AR max vel: 1.96 cm2
AV Area VTI: 2.25 cm2
AV Area mean vel: 1.99 cm2
AV Mean grad: 4 mmHg
AV Peak grad: 9.1 mmHg
Ao pk vel: 1.51 m/s
Area-P 1/2: 2.11 cm2
S' Lateral: 2.15 cm
Single Plane A4C EF: 61.4 %

## 2021-04-24 ENCOUNTER — Encounter: Payer: Self-pay | Admitting: Cardiology

## 2021-04-24 ENCOUNTER — Ambulatory Visit: Payer: Medicare PPO | Admitting: Cardiology

## 2021-04-24 VITALS — BP 128/80 | HR 80 | Ht 64.0 in | Wt 142.5 lb

## 2021-04-24 DIAGNOSIS — I10 Essential (primary) hypertension: Secondary | ICD-10-CM | POA: Diagnosis not present

## 2021-04-24 DIAGNOSIS — I499 Cardiac arrhythmia, unspecified: Secondary | ICD-10-CM | POA: Diagnosis not present

## 2021-04-24 NOTE — Patient Instructions (Signed)
Medication Instructions:   Your physician recommends that you continue on your current medications as directed. Please refer to the Current Medication list given to you today.   *If you need a refill on your cardiac medications before your next appointment, please call your pharmacy*   Lab Work: None ordered  If you have labs (blood work) drawn today and your tests are completely normal, you will receive your results only by: [place] (if you have MyChart) OR A paper copy in the mail If you have any lab test that is abnormal or we need to change your treatment, we will call you to review the results.   Testing/Procedures: None ordered   Follow-Up: At [facility], you and your health needs are our priority.  As part of our continuing mission to provide you with exceptional heart care, we have created designated Provider Care Teams.  These Care Teams include your primary Cardiologist (physician) and Advanced Practice Providers (APPs -  Physician Assistants and Nurse Practitioners) who all work together to provide you with the care you need, when you need it.  We recommend signing up for the patient portal called "MyChart".  Sign up information is provided on this After Visit Summary.  MyChart is used to connect with patients for Virtual Visits (Telemedicine).  Patients are able to view lab/test results, encounter notes, upcoming appointments, etc.  Non-urgent messages can be sent to your provider as well.   To learn more about what you can do with MyChart, go to [email or web address].    Your next appointment:   6 month(s)  The format for your next appointment:   In Person  Provider:   You may see Glenetta Hew, MD or one of the following Advanced Practice Providers on your designated Care Team:   Murray Hodgkins, NP Christell Faith, PA-C Cadence Kathlen Mody, [place]    Other Instructions N/A
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
## 2021-05-10 ENCOUNTER — Other Ambulatory Visit: Payer: Self-pay | Admitting: Family Medicine

## 2021-05-10 ENCOUNTER — Encounter: Payer: Self-pay | Admitting: Cardiology

## 2021-05-10 DIAGNOSIS — F5101 Primary insomnia: Secondary | ICD-10-CM

## 2021-05-10 NOTE — Assessment & Plan Note (Signed)
Frequent PACs and PVCs as confirmed on EKG.  No real true arrhythmias noted. ? ?Given the lack of symptoms, I am not overly inclined to treat.  However low threshold to consider beta-blocker such as bisoprolol => would probably start at like 2.5 or 5 mg, but for now we will hold off given the absence of symptoms ?

## 2021-05-10 NOTE — Assessment & Plan Note (Signed)
Stable blood pressure on current meds. ? ?Low threshold to consider beta-blocker. ?

## 2021-05-12 NOTE — Telephone Encounter (Signed)
Requested medication (s) are due for refill today: yes ? ?Requested medication (s) are on the active medication list: yes ? ?Last refill:  12/16/20 ##30/2 ? ?Future visit scheduled: no ? ?Notes to clinic:  Unable to refill per protocol, cannot delegate. ? ? ? ?  ?Requested Prescriptions  ?Pending Prescriptions Disp Refills  ? zolpidem (AMBIEN) 5 MG tablet [Pharmacy Med Name: ZOLPIDEM TARTRATE 5 MG TAB] 30 tablet   ?  Sig: TAKE 1 TABLET BY MOUTH AT BEDTIME AS NEEDED FOR SLEEP  ?  ? Not Delegated - Psychiatry:  Anxiolytics/Hypnotics Failed - 05/10/2021 11:59 AM  ?  ?  Failed - This refill cannot be delegated  ?  ?  Failed - Urine Drug Screen completed in last 360 days  ?  ?  Passed - Valid encounter within last 6 months  ?  Recent Outpatient Visits   ? ?      ? 1 month ago Centrilobular emphysema ([place])  ? [place], DO  ? 3 months ago Left ear impacted cerumen  ? [place], DO  ? 4 months ago Centrilobular emphysema [facility])  ? [place], DO  ? 4 months ago Erroneous encounter - disregard  ? [facility] [place], [place] W, NP  ? 7 months ago Centrilobular emphysema [facility])  ? [place], DO  ? ?  ?  ?Future Appointments   ? ?        ? In 5 months Leonie Man, MD [facility], [facility]  ? ?  ? ?  ?  ?  ? ?

## 2021-06-12 DIAGNOSIS — M069 Rheumatoid arthritis, unspecified: Secondary | ICD-10-CM | POA: Diagnosis not present

## 2021-06-12 DIAGNOSIS — J449 Chronic obstructive pulmonary disease, unspecified: Secondary | ICD-10-CM | POA: Diagnosis not present

## 2021-06-12 DIAGNOSIS — M1990 Unspecified osteoarthritis, unspecified site: Secondary | ICD-10-CM | POA: Diagnosis not present

## 2021-06-12 DIAGNOSIS — E785 Hyperlipidemia, unspecified: Secondary | ICD-10-CM | POA: Diagnosis not present

## 2021-06-12 DIAGNOSIS — J301 Allergic rhinitis due to pollen: Secondary | ICD-10-CM | POA: Diagnosis not present

## 2021-06-12 DIAGNOSIS — F411 Generalized anxiety disorder: Secondary | ICD-10-CM | POA: Diagnosis not present

## 2021-06-12 DIAGNOSIS — G4700 Insomnia, unspecified: Secondary | ICD-10-CM | POA: Diagnosis not present

## 2021-06-12 DIAGNOSIS — K219 Gastro-esophageal reflux disease without esophagitis: Secondary | ICD-10-CM | POA: Diagnosis not present

## 2021-06-12 DIAGNOSIS — I10 Essential (primary) hypertension: Secondary | ICD-10-CM | POA: Diagnosis not present

## 2021-07-25 DIAGNOSIS — M5030 Other cervical disc degeneration, unspecified cervical region: Secondary | ICD-10-CM | POA: Diagnosis not present

## 2021-07-25 DIAGNOSIS — M5136 Other intervertebral disc degeneration, lumbar region: Secondary | ICD-10-CM | POA: Diagnosis not present

## 2021-07-25 DIAGNOSIS — Z79899 Other long term (current) drug therapy: Secondary | ICD-10-CM | POA: Diagnosis not present

## 2021-07-25 DIAGNOSIS — M5416 Radiculopathy, lumbar region: Secondary | ICD-10-CM | POA: Diagnosis not present

## 2021-08-04 ENCOUNTER — Ambulatory Visit: Payer: Medicare PPO | Admitting: Family Medicine

## 2021-08-04 ENCOUNTER — Encounter: Payer: Self-pay | Admitting: Family Medicine

## 2021-08-04 VITALS — BP 138/82 | HR 77 | Ht 64.0 in | Wt 138.2 lb

## 2021-08-04 DIAGNOSIS — J432 Centrilobular emphysema: Secondary | ICD-10-CM | POA: Diagnosis not present

## 2021-08-04 DIAGNOSIS — M069 Rheumatoid arthritis, unspecified: Secondary | ICD-10-CM | POA: Diagnosis not present

## 2021-08-04 DIAGNOSIS — Z23 Encounter for immunization: Secondary | ICD-10-CM

## 2021-08-04 DIAGNOSIS — I10 Essential (primary) hypertension: Secondary | ICD-10-CM | POA: Diagnosis not present

## 2021-08-04 NOTE — Progress Notes (Signed)
Subjective:    Patient ID: Elizabeth Bailey, female    DOB: [birth year], 80 y.o.   MRN: [id number]  Elizabeth Bailey is a 81 y.o. female presenting on 08/04/2021 for COPD and Hypertension   HPI  Dyspnea, chronic Asthma/COPD overlap Irregular heartbeat She has not had chest pain or pressure She works with [facility] Pulmonology Dr Lanney Gins and is on inhaler therapy maintenance including Breztri. Helpful but still has dyspnea. On Singulair, will refill   Rheumatoid Arthritis OA/DJD Followed by [facility] Dr [place] on Lyrica PRN not regularly   GERD On PPI Omeprazole   CHRONIC HTN: Doing well on Amlodipine '10mg'$  daily Slightly elevated today will repeat   Insomnia Chronic problem Improved on Zolpidem '5mg'$  PRN most nights, not ready for refill yet        01/31/2021   11:57 AM 01/31/2021   11:54 AM 01/16/2020    8:35 AM  Depression screen PHQ 2/9  Decreased Interest 0 0 0  Down, Depressed, Hopeless 0 0 0  PHQ - 2 Score 0 0 0    Social History   Tobacco Use   Smoking status: Former    Packs/day: 0.50    Years: 10.00    Pack years: 5.00    Types: Cigarettes    Quit date: 1988    Years since quitting: 35.4   Smokeless tobacco: Never   Tobacco comments:    quit 1988  Vaping Use   Vaping Use: Never used  Substance Use Topics   Alcohol use: Yes    Comment: occasional glass of wine   Drug use: No    Review of Systems Per HPI unless specifically indicated above     Objective:    BP (!) 144/82   Pulse 77   Ht '5\' 4"'$  (1.626 m)   Wt 138 lb 3.2 oz (62.7 kg)   SpO2 95%   BMI 23.72 kg/m   Wt Readings from Last 3 Encounters:  08/04/21 138 lb 3.2 oz (62.7 kg)  04/24/21 142 lb 8 oz (64.6 kg)  04/10/21 142 lb 12.8 oz (64.8 kg)    Physical Exam Vitals and nursing note reviewed.  Constitutional:      General: She is not in acute distress.    Appearance: She is well-developed. She is not diaphoretic.     Comments: Well-appearing 81 yr female, comfortable,  cooperative  HENT:     Head: Normocephalic and atraumatic.  Eyes:     General:        Right eye: No discharge.        Left eye: No discharge.     Conjunctiva/sclera: Conjunctivae normal.  Neck:     Thyroid: No thyromegaly.  Cardiovascular:     Rate and Rhythm: Normal rate and regular rhythm.     Heart sounds: Normal heart sounds. No murmur heard. Pulmonary:     Effort: No respiratory distress.     Breath sounds: Normal breath sounds. No wheezing or rales.     Comments: Increased work of breathing similar to previous baseline. Speaks full sentences. No wheezing. She has some mild reduced air movement at baseline. Musculoskeletal:        General: Normal range of motion.     Cervical back: Normal range of motion and neck supple.  Lymphadenopathy:     Cervical: No cervical adenopathy.  Skin:    General: Skin is warm and dry.     Findings: No erythema or rash.  Neurological:  Mental Status: She is alert and oriented to person, place, and time.  Psychiatric:        Behavior: Behavior normal.     Comments: Well groomed, good eye contact, normal speech and thoughts      Results for orders placed or performed in visit on 04/23/21  ECHOCARDIOGRAM COMPLETE  Result Value Ref Range   AR max vel 1.96 cm2   AV Peak grad 9.1 mmHg   Ao pk vel 1.51 m/s   S' Lateral 2.15 cm   Area-P 1/2 2.11 cm2   AV Area VTI 2.25 cm2   AV Mean grad 4.0 mmHg   Single Plane A4C EF 61.4 %   AV Area mean vel 1.99 cm2      Assessment & Plan:   Problem List Items Addressed This Visit     Essential hypertension (Chronic)   Relevant Orders   COMPLETE METABOLIC PANEL WITH GFR   CBC with Differential/Platelet   Rheumatoid arthritis (HCC)   Centrilobular emphysema (HCC) - Primary   Relevant Orders   COMPLETE METABOLIC PANEL WITH GFR   CBC with Differential/Platelet   Other Visit Diagnoses     Need for vaccination for Strep pneumoniae       Relevant Orders   Pneumococcal conjugate vaccine  20-valent       HTN BP repeat improved today Continue current therapy  Due Prevnar 20 vaccine today, final dose.  Centrilobular Emphysema Chronic persistent problem with increased work of breathing at baseline. Followed by Pulmonology Continue on Breztri inhaler and Albuterol PRN  RA Followed by Rheumatology.   Orders Placed This Encounter  Procedures   Pneumococcal conjugate vaccine 20-valent   COMPLETE METABOLIC PANEL WITH GFR   CBC with Differential/Platelet     No orders of the defined types were placed in this encounter.     Follow up plan: Return in about 4 months (around 12/04/2021) for 4 month follow-up COPD/HTN.   [name], [facility] 08/04/2021, 9:54 AM

## 2021-08-04 NOTE — Patient Instructions (Addendum)
Thank you for coming to the office today.  Keep on current medications and inhalers.  Pneumonia shot today Prevnar20 it is the final dose!  Please schedule a Follow-up Appointment to: Return in about 4 months (around 12/04/2021) for 4 month follow-up COPD/HTN.  If you have any other questions or concerns, please feel free to call the office or send a message through [place]. You may also schedule an earlier appointment if necessary.  Additionally, you may be receiving a survey about your experience at our office within a few days to 1 week by e-mail or mail. We value your feedback.  [name], DO [place]

## 2021-08-05 LAB — COMPLETE METABOLIC PANEL WITH GFR
AG Ratio: 1.4 (calc) (ref 1.0–2.5)
ALT: 9 U/L (ref 6–29)
AST: 15 U/L (ref 10–35)
Albumin: 4.2 g/dL (ref 3.6–5.1)
Alkaline phosphatase (APISO): 87 U/L (ref 37–153)
BUN/Creatinine Ratio: 16 (calc) (ref 6–22)
BUN: 19 mg/dL (ref 7–25)
CO2: 25 mmol/L (ref 20–32)
Calcium: 9.9 mg/dL (ref 8.6–10.4)
Chloride: 104 mmol/L (ref 98–110)
Creat: 1.2 mg/dL — ABNORMAL HIGH (ref 0.60–1.00)
Globulin: 3.1 g/dL (calc) (ref 1.9–3.7)
Glucose, Bld: 120 mg/dL (ref 65–139)
Potassium: 4.2 mmol/L (ref 3.5–5.3)
Sodium: 139 mmol/L (ref 135–146)
Total Bilirubin: 0.9 mg/dL (ref 0.2–1.2)
Total Protein: 7.3 g/dL (ref 6.1–8.1)
eGFR: 46 mL/min/{1.73_m2} — ABNORMAL LOW (ref >=60)

## 2021-08-05 LAB — CBC WITH DIFFERENTIAL/PLATELET
Absolute Monocytes: 520 cells/uL (ref 200–950)
Basophils Absolute: 20 cells/uL (ref 0–200)
Basophils Relative: 0.4 %
Eosinophils Absolute: 168 cells/uL (ref 15–500)
Eosinophils Relative: 3.3 %
HCT: 40.4 % (ref 35.0–45.0)
Hemoglobin: 13.9 g/dL (ref 11.7–15.5)
Lymphs Abs: 1377 cells/uL (ref 850–3900)
MCH: 33.3 pg — ABNORMAL HIGH (ref 27.0–33.0)
MCHC: 34.4 g/dL (ref 32.0–36.0)
MCV: 96.9 fL (ref 80.0–100.0)
MPV: 10.5 fL (ref 7.5–12.5)
Monocytes Relative: 10.2 %
Neutro Abs: 3014 cells/uL (ref 1500–7800)
Neutrophils Relative %: 59.1 %
Platelets: 302 10*3/uL (ref 140–400)
RBC: 4.17 10*6/uL (ref 3.80–5.10)
RDW: 12 % (ref 11.0–15.0)
Total Lymphocyte: 27 %
WBC: 5.1 10*3/uL (ref 3.8–10.8)

## 2021-09-09 ENCOUNTER — Other Ambulatory Visit: Payer: Self-pay | Admitting: Family Medicine

## 2021-09-09 DIAGNOSIS — F5101 Primary insomnia: Secondary | ICD-10-CM

## 2021-09-10 NOTE — Telephone Encounter (Signed)
Requested medication (s) are due for refill today - yes  Requested medication (s) are on the active medication list -yes  Future visit scheduled -no  Last refill: 05/12/21 #30 3RF  Notes to clinic: non delegated Rx  Requested Prescriptions  Pending Prescriptions Disp Refills   zolpidem (AMBIEN) 5 MG tablet [Pharmacy Med Name: ZOLPIDEM TARTRATE 5 MG TAB] 30 tablet     Sig: TAKE 1 TABLET BY MOUTH AT BEDTIME AS NEEDED FOR SLEEP     Not Delegated - Psychiatry:  Anxiolytics/Hypnotics Failed - 09/09/2021  4:18 PM      Failed - This refill cannot be delegated      Failed - Urine Drug Screen completed in last 360 days      Passed - Valid encounter within last 6 months    Recent Outpatient Visits           1 month ago Centrilobular emphysema ([place])   [facility] Olin Hauser, DO   5 months ago Centrilobular emphysema ([place])   [facility] Olin Hauser, DO   7 months ago Left ear impacted cerumen   [facility] [place], Devonne Doughty, DO   8 months ago Centrilobular emphysema ([place])   [place], DO   8 months ago Erroneous encounter - disregard   [facility] [place], Coralie Keens, NP       Future Appointments             In 1 month Ellyn Hack, Leonie Green, MD [facility], [facility]               Requested Prescriptions  Pending Prescriptions Disp Refills   zolpidem (AMBIEN) 5 MG tablet [Pharmacy Med Name: ZOLPIDEM TARTRATE 5 MG TAB] 30 tablet     Sig: TAKE 1 TABLET BY MOUTH AT BEDTIME AS NEEDED FOR SLEEP     Not Delegated - Psychiatry:  Anxiolytics/Hypnotics Failed - 09/09/2021  4:18 PM      Failed - This refill cannot be delegated      Failed - Urine Drug Screen completed in last 360 days      Passed - Valid encounter within last 6 months    Recent Outpatient Visits           1 month ago Centrilobular emphysema ([place])   [place], DO   5 months ago Centrilobular emphysema [facility])   [place], DO   7 months ago Left ear impacted cerumen   [place], DO   8 months ago Centrilobular emphysema [facility])   [place], DO   8 months ago Erroneous encounter - disregard   [facility] [place], Coralie Keens, NP       Future Appointments             In 1 month Ellyn Hack, Leonie Green, MD [facility], [place]

## 2021-10-23 ENCOUNTER — Encounter: Payer: Self-pay | Admitting: Cardiology

## 2021-10-23 ENCOUNTER — Ambulatory Visit: Payer: Medicare PPO | Admitting: Cardiology

## 2021-10-23 ENCOUNTER — Other Ambulatory Visit
Admission: RE | Admit: 2021-10-23 | Discharge: 2021-10-23 | Disposition: A | Discharge status: Home / Self Care | Payer: Medicare PPO | Source: Ambulatory Visit | Attending: Cardiology | Admitting: Cardiology

## 2021-10-23 VITALS — BP 122/62 | HR 77 | Ht 62.0 in | Wt 134.6 lb

## 2021-10-23 DIAGNOSIS — E785 Hyperlipidemia, unspecified: Secondary | ICD-10-CM | POA: Diagnosis not present

## 2021-10-23 DIAGNOSIS — I6529 Occlusion and stenosis of unspecified carotid artery: Secondary | ICD-10-CM

## 2021-10-23 DIAGNOSIS — I499 Cardiac arrhythmia, unspecified: Secondary | ICD-10-CM

## 2021-10-23 DIAGNOSIS — I10 Essential (primary) hypertension: Secondary | ICD-10-CM

## 2021-10-23 LAB — LIPID PANEL
Cholesterol: 222 mg/dL — ABNORMAL HIGH (ref 0–200)
HDL: 76 mg/dL (ref >40)
LDL Cholesterol: 133 mg/dL — ABNORMAL HIGH (ref 0–99)
Total CHOL/HDL Ratio: 2.9 RATIO
Triglycerides: 64 mg/dL (ref <150)
VLDL: 13 mg/dL (ref 0–40)

## 2021-10-23 NOTE — Patient Instructions (Signed)
Medication Instructions:   Your physician recommends that you continue on your current medications as directed. Please refer to the Current Medication list given to you today.  *If you need a refill on your cardiac medications before your next appointment, please call your pharmacy*   Lab Work:  Today at the medical mall: Lipid panel  -  Please go to the [place] [facility] [place] in at the Registration Desk: 1st desk to the right, past the screening table   If you have labs (blood work) drawn today and your tests are completely normal, you will receive your results only by: [place] (if you have MyChart) OR A paper copy in the mail If you have any lab test that is abnormal or we need to change your treatment, we will call you to review the results.   Testing/Procedures:  None ordered   Follow-Up: At [facility], you and your health needs are our priority.  As part of our continuing mission to provide you with exceptional heart care, we have created designated Provider Care Teams.  These Care Teams include your primary Cardiologist (physician) and Advanced Practice Providers (APPs -  Physician Assistants and Nurse Practitioners) who all work together to provide you with the care you need, when you need it.  We recommend signing up for the patient portal called "MyChart".  Sign up information is provided on this After Visit Summary.  MyChart is used to connect with patients for Virtual Visits (Telemedicine).  Patients are able to view lab/test results, encounter notes, upcoming appointments, etc.  Non-urgent messages can be sent to your provider as well.   To learn more about what you can do with MyChart, go to [email or web address].    Your next appointment:   6 month(s)  The format for your next appointment:   In Person  Provider:   You may see Dr. Glenetta Hew or one of the following Advanced Practice Providers on your designated Care Team:    Murray Hodgkins, NP Christell Faith, PA-C Cadence Kathlen Mody, [place]    Important Information About Sugar
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
## 2021-10-24 ENCOUNTER — Telehealth: Payer: Self-pay

## 2021-10-24 NOTE — Telephone Encounter (Signed)
-----   Message from Gerrie Nordmann, NP sent at 10/23/2021  4:49 PM EDT ----- Please let Elizabeth Bailey know that her cholesterol is increasing with her zocor 20 mg daily. She would get better benefit from taking rosuvastatin 5 mg daily. If she is agreeable please start her on new statin therapy and recheck lipids and LFT's in 8-10  weeks after starting.

## 2021-10-24 NOTE — Telephone Encounter (Signed)
Spoke w/ pt's son, Larene Beach. Advised him of Sherri's recommendations. He is a English as a second language teacher and weary of lots of meds, he has a "less is more" approach. He will do some research on rosuvastatin and call back and let us know whether he would like his mother to switch meds, as she is almost 60 and he doesn't want to make unnecessary changes to her meds. He will try to call back before lunch today.

## 2021-11-04 DIAGNOSIS — M5412 Radiculopathy, cervical region: Secondary | ICD-10-CM | POA: Diagnosis not present

## 2021-11-04 DIAGNOSIS — M5030 Other cervical disc degeneration, unspecified cervical region: Secondary | ICD-10-CM | POA: Diagnosis not present

## 2021-11-04 DIAGNOSIS — M5136 Other intervertebral disc degeneration, lumbar region: Secondary | ICD-10-CM | POA: Diagnosis not present

## 2021-11-04 DIAGNOSIS — M5416 Radiculopathy, lumbar region: Secondary | ICD-10-CM | POA: Diagnosis not present

## 2021-11-04 DIAGNOSIS — Z79899 Other long term (current) drug therapy: Secondary | ICD-10-CM | POA: Diagnosis not present

## 2021-12-01 ENCOUNTER — Telehealth: Payer: Self-pay | Admitting: Family Medicine

## 2021-12-01 NOTE — Telephone Encounter (Signed)
Medication Refill - Medication: letrozole (FEMARA) 2.5 MG tablet [[id number]]   Has the patient contacted their pharmacy? No. (Agent: If no, request that the patient contact the pharmacy for the refill. If patient does not wish to contact the pharmacy document the reason why and proceed with request.) (Agent: If yes, when and what did the pharmacy advise?)  Preferred Pharmacy (with phone number or street name):  [facility] [place], [place] [address]  Phone: [phone] Fax: [phone]  Hours: Not open 24 hours   Has the patient been seen for an appointment in the last year OR does the patient have an upcoming appointment? Yes.  08/04/21  Agent: Please be advised that RX refills may take up to 3 business days. We ask that you follow-up with your pharmacy.

## 2021-12-01 NOTE — Telephone Encounter (Signed)
Please notify patient that I do not order Letrozole, this medication should be managed by her breast cancer treatment team / surgeons. They ordered it last year.  [name], DO [facility] 12/01/2021, 4:57 PM

## 2021-12-02 ENCOUNTER — Telehealth: Payer: Self-pay | Admitting: Family Medicine

## 2021-12-02 NOTE — Telephone Encounter (Signed)
This encounter was created in error - please disregard.

## 2021-12-02 NOTE — Telephone Encounter (Signed)
Pt is requesting a  refill on  90 Letrozole 2.5 MG Tab  called into  [facility]

## 2021-12-02 NOTE — Telephone Encounter (Signed)
Pt called, advised of message below from Dr. Noland Fordyce pt number to call Dr. Bary Castilla. Advised pt to call back if she had any difficulty. Pt seemed confused and said she only had 2 pill left but would call back if she needed to.

## 2021-12-02 NOTE — Telephone Encounter (Signed)
Okay, I am not sure best way to proceed.  So her General Surgeon treating her breast cancer is no longer accepting patients but they want her to stay on medicine?  I am happy to re order for 30 day until we can figure this out  I would need some assistance with this though, we may need a call in to Dr Dwyane Luo office to find out what they need [place] to do. If they are asking [place] to manage the Letrozole I would need to know this and then we may need to find a different doctor that can manage it until she is done.  Please let me know!  [name], [facility] 12/02/2021, 9:37 AM

## 2021-12-02 NOTE — Telephone Encounter (Signed)
I spoke with her son, Larene Beach.   He said that he would be calling her to get clarification and call me back.  It was his understanding that she wasn't taking the medicine anymore so he will check with her and call me back.

## 2021-12-02 NOTE — Telephone Encounter (Signed)
Per Dr. Raliegh Ip, he is not the physician responsible for filling this.   He stated that her treating oncologist needs to be in charge of this medication.  I spoke earlier with her son, Elizabeth Bailey, who I am waiting to hear back from because he was under the impression that she isn't on the medication any longer.

## 2021-12-02 NOTE — Telephone Encounter (Signed)
Pt went to fill the RX and Dr. Tollie Pizza was not accepting any pts / She has 2 pills left/ pt asked if she needs to call Dr. Tollie Pizza / she said he wouldn't fill it /please advise

## 2021-12-04 ENCOUNTER — Other Ambulatory Visit: Payer: Self-pay | Admitting: Family Medicine

## 2021-12-04 DIAGNOSIS — Z1231 Encounter for screening mammogram for malignant neoplasm of breast: Secondary | ICD-10-CM

## 2021-12-11 ENCOUNTER — Other Ambulatory Visit: Payer: Self-pay | Admitting: Family Medicine

## 2021-12-11 DIAGNOSIS — F5101 Primary insomnia: Secondary | ICD-10-CM

## 2021-12-12 ENCOUNTER — Encounter: Payer: Self-pay | Admitting: Family Medicine

## 2021-12-12 ENCOUNTER — Ambulatory Visit (INDEPENDENT_AMBULATORY_CARE_PROVIDER_SITE_OTHER): Payer: Medicare PPO | Admitting: Family Medicine

## 2021-12-12 VITALS — BP 125/57 | HR 63 | Ht 62.0 in | Wt 134.0 lb

## 2021-12-12 DIAGNOSIS — Z23 Encounter for immunization: Secondary | ICD-10-CM | POA: Diagnosis not present

## 2021-12-12 DIAGNOSIS — I739 Peripheral vascular disease, unspecified: Secondary | ICD-10-CM | POA: Diagnosis not present

## 2021-12-12 DIAGNOSIS — J432 Centrilobular emphysema: Secondary | ICD-10-CM

## 2021-12-12 DIAGNOSIS — E782 Mixed hyperlipidemia: Secondary | ICD-10-CM | POA: Diagnosis not present

## 2021-12-12 DIAGNOSIS — Z853 Personal history of malignant neoplasm of breast: Secondary | ICD-10-CM | POA: Diagnosis not present

## 2021-12-12 MED ORDER — BREZTRI AEROSPHERE 160-9-4.8 MCG/ACT IN AERO
2.0000 | INHALATION_SPRAY | Freq: Two times a day (BID) | RESPIRATORY_TRACT | 11 refills | Status: DC
Start: 2021-12-12 — End: 2022-10-16

## 2021-12-12 MED ORDER — SIMVASTATIN 20 MG PO TABS: 20.0000 mg | ORAL_TABLET | Freq: Every day | ORAL | 3 refills | Status: DC

## 2021-12-12 NOTE — Progress Notes (Unsigned)
Subjective:    Patient ID: Elizabeth Bailey, female    DOB: May 08, 1940, 81 y.o.   MRN: [id number]  Elizabeth Bailey is a 81 y.o. female presenting on 12/12/2021 for Medication Refill   HPI  She has completed 5 years on Letrozole 2.'5mg'$  daily - and has been discontinued by Dr Hervey Ard, this was a topic of recent conversation for refills, and we re-directed her back to [facility] for their opinion since they were managing this breast cancer surveillance and treatment. And based on what she tells me today - she was told she no longer needs it since she has completed 5 years - Upcoming Mammogram on 11/7  Health Maintenance: ***     01/31/2021   11:57 AM 01/31/2021   11:54 AM 01/16/2020    8:35 AM  Depression screen PHQ 2/9  Decreased Interest 0 0 0  Down, Depressed, Hopeless 0 0 0  PHQ - 2 Score 0 0 0    Social History   Tobacco Use   Smoking status: Former    Packs/day: 0.50    Years: 10.00    Total pack years: 5.00    Types: Cigarettes    Quit date: 1988    Years since quitting: 35.8   Smokeless tobacco: Never   Tobacco comments:    quit 1988  Vaping Use   Vaping Use: Never used  Substance Use Topics   Alcohol use: Yes    Comment: occasional glass of wine   Drug use: No    Review of Systems Per HPI unless specifically indicated above     Objective:    BP (!) 125/57   Pulse 63   Ht '5\' 2"'$  (1.575 m)   Wt 134 lb (60.8 kg)   SpO2 95%   BMI 24.51 kg/m   Wt Readings from Last 3 Encounters:  12/12/21 134 lb (60.8 kg)  10/23/21 134 lb 9.6 oz (61.1 kg)  08/04/21 138 lb 3.2 oz (62.7 kg)    Physical Exam Results for orders placed or performed during the hospital encounter of 10/23/21  Lipid Profile  Result Value Ref Range   Cholesterol 222 (H) 0 - 200 mg/dL   Triglycerides 64 <150 mg/dL   HDL 76 >40 mg/dL   Total CHOL/HDL Ratio 2.9 RATIO   VLDL 13 0 - 40 mg/dL   LDL Cholesterol 133 (H) 0 - 99 mg/dL      Assessment & Plan:   Problem List Items  Addressed This Visit     PAD (peripheral artery disease) (HCC) (Chronic)   Relevant Medications   simvastatin (ZOCOR) 20 MG tablet   Centrilobular emphysema (HCC) - Primary   Relevant Medications   Budeson-Glycopyrrol-Formoterol (BREZTRI AEROSPHERE) 160-9-4.8 MCG/ACT AERO   Other Visit Diagnoses     Needs flu shot       Relevant Orders   Flu Vaccine QUAD High Dose(Fluad)   History of breast cancer       Mixed hyperlipidemia       Relevant Medications   simvastatin (ZOCOR) 20 MG tablet       Meds ordered this encounter  Medications   Budeson-Glycopyrrol-Formoterol (BREZTRI AEROSPHERE) 160-9-4.8 MCG/ACT AERO    Sig: Inhale 2 puffs into the lungs 2 (two) times daily.    Dispense:  10.7 g    Refill:  11   simvastatin (ZOCOR) 20 MG tablet    Sig: Take 1 tablet (20 mg total) by mouth at bedtime.    Dispense:  45 tablet  Refill:  3      Follow up plan: No follow-ups on file.   Elizabeth Bailey, [facility] 12/12/2021, 11:25 AM

## 2021-12-12 NOTE — Patient Instructions (Addendum)
Thank you for coming to the office today.  Discontinue Letrozole 2.'5mg'$  daily, this was for breast cancer prevention, however it was managed by Dr Hervey Ard, he was the last physician to order it, and it sounds like he has discontinued it after 5 years of use, is the normal time to take this med. I normally do not prescribe this medication.  I have refilled the Cholesterol med Simvastatin  Also refilled Breztri 2 puffs twice a day.  Keep up with the upcoming breast mammogram imaging scheduled.  Flu Shot today  Please schedule a Follow-up Appointment to: Return in about 6 months (around 06/13/2022), or if symptoms worsen or fail to improve, for 6 month follow-up COPD.  If you have any other questions or concerns, please feel free to call the office or send a message through [place]. You may also schedule an earlier appointment if necessary.  Additionally, you may be receiving a survey about your experience at our office within a few days to 1 week by e-mail or mail. We value your feedback.  [name], DO [place]

## 2021-12-12 NOTE — Telephone Encounter (Signed)
Requested medication (s) are due for refill today: yes  Requested medication (s) are on the active medication list: yes  Last refill:  09/10/21  Future visit scheduled:yes  Notes to clinic:  Unable to refill per protocol, cannot delegate Ambien, routing for approval.     Requested Prescriptions  Pending Prescriptions Disp Refills   zolpidem (AMBIEN) 5 MG tablet [Pharmacy Med Name: ZOLPIDEM TARTRATE 5 MG TAB] 30 tablet     Sig: TAKE 1 TABLET BY MOUTH AT BEDTIME AS NEEDED FOR SLEEP     Not Delegated - Psychiatry:  Anxiolytics/Hypnotics Failed - 12/11/2021  1:33 PM      Failed - This refill cannot be delegated      Failed - Urine Drug Screen completed in last 360 days      Passed - Valid encounter within last 6 months    Recent Outpatient Visits           4 months ago Centrilobular emphysema ([place])   [place], DO   8 months ago Centrilobular emphysema [facility])   [facility] O[place] Hauser, DO   10 months ago Left ear impacted cerumen   [place], DO   11 months ago Centrilobular emphysema [facility])   [facility] Olin Hauser, DO   11 months ago Erroneous encounter - disregard   [place] [facility] [place], Coralie Keens, NP       Future Appointments             Today [place], Devonne Doughty, [facility], [place]   In 4 months Ellyn Hack, Leonie Green, MD [place]. [place]

## 2022-01-06 ENCOUNTER — Ambulatory Visit
Admission: RE | Admit: 2022-01-06 | Discharge: 2022-01-06 | Disposition: A | Discharge status: Home / Self Care | Payer: Medicare PPO | Source: Ambulatory Visit | Attending: Family Medicine | Admitting: Family Medicine

## 2022-01-06 DIAGNOSIS — Z1231 Encounter for screening mammogram for malignant neoplasm of breast: Secondary | ICD-10-CM | POA: Insufficient documentation

## 2022-01-10 ENCOUNTER — Other Ambulatory Visit: Payer: Self-pay | Admitting: Family Medicine

## 2022-01-10 DIAGNOSIS — I10 Essential (primary) hypertension: Secondary | ICD-10-CM

## 2022-02-03 DIAGNOSIS — M5030 Other cervical disc degeneration, unspecified cervical region: Secondary | ICD-10-CM | POA: Diagnosis not present

## 2022-02-03 DIAGNOSIS — M5416 Radiculopathy, lumbar region: Secondary | ICD-10-CM | POA: Diagnosis not present

## 2022-02-03 DIAGNOSIS — M5412 Radiculopathy, cervical region: Secondary | ICD-10-CM | POA: Diagnosis not present

## 2022-02-03 DIAGNOSIS — M5136 Other intervertebral disc degeneration, lumbar region: Secondary | ICD-10-CM | POA: Diagnosis not present

## 2022-02-03 DIAGNOSIS — Z79899 Other long term (current) drug therapy: Secondary | ICD-10-CM | POA: Diagnosis not present

## 2022-03-14 ENCOUNTER — Other Ambulatory Visit: Payer: Self-pay | Admitting: Family Medicine

## 2022-03-14 DIAGNOSIS — E782 Mixed hyperlipidemia: Secondary | ICD-10-CM

## 2022-03-14 DIAGNOSIS — F5101 Primary insomnia: Secondary | ICD-10-CM

## 2022-03-14 DIAGNOSIS — J302 Other seasonal allergic rhinitis: Secondary | ICD-10-CM

## 2022-03-16 NOTE — Telephone Encounter (Signed)
Requested medication (s) are due for refill today: routing for approval  Requested medication (s) are on the active medication list: yes  Last refill:  multiple dates  Future visit scheduled:yes  Notes to clinic:  Unable to refill per protocol, cannot delegate.      Requested Prescriptions  Pending Prescriptions Disp Refills   zolpidem (AMBIEN) 5 MG tablet [Pharmacy Med Name: ZOLPIDEM TARTRATE 5 MG TAB] 30 tablet     Sig: TAKE 1 TABLET BY MOUTH AT BEDTIME AS NEEDED FOR SLEEP     Not Delegated - Psychiatry:  Anxiolytics/Hypnotics Failed - 03/14/2022  2:41 PM      Failed - This refill cannot be delegated      Failed - Urine Drug Screen completed in last 360 days      Passed - Valid encounter within last 6 months    Recent Outpatient Visits           3 months ago Centrilobular emphysema ([place])   [place], DO   7 months ago Centrilobular emphysema [facility])   [facility] Olin Hauser, DO   11 months ago Centrilobular emphysema [facility])   [facility] Olin Hauser, DO   1 year ago Left ear impacted cerumen   [place], DO   1 year ago Centrilobular emphysema [facility])   [facility] Olin Hauser, DO       Future Appointments             In 1 month Ellyn Hack, Leonie Green, MD [place]. [place]   In 3 months Kar[place]os, Devonne Doughty, DO [facility], [facility]             simvastatin (ZOCOR) 20 MG tablet [Pharmacy Med Name: SIMVASTATIN 20 MG TAB] 45 tablet 3    Sig: TAKE 1 TABLET BY MOUTH AT BEDTIME     Cardiovascular:  Antilipid - Statins Failed - 03/14/2022  2:41 PM      Failed - Lipid Panel in normal range within the last 12 months    Cholesterol, Total  Date Value Ref Range Status  12/12/2014 178 100 - 199 mg/dL Final   Cholesterol  Date Value Ref Range  Status  10/23/2021 222 (H) 0 - 200 mg/dL Final   LDL Cholesterol (Calc)  Date Value Ref Range Status  04/07/2018 123 (H) mg/dL (calc) Final    Comment:    Reference range: <100 . Desirable range <100 mg/dL for primary prevention;   <70 mg/dL for patients with CHD or diabetic patients  with > or = 2 CHD risk factors. [place] Kitchen LDL-C is now calculated using the Martin-Hopkins  calculation, which is a validated novel method providing  better accuracy than the Friedewald equation in the  estimation of LDL-C.  Cresenciano Genre et al. Annamaria Helling. 1740;814(48): 2061-2068  (http://education.[employer].com/faq/FAQ164)    LDL Cholesterol  Date Value Ref Range Status  10/23/2021 133 (H) 0 - 99 mg/dL Final    Comment:           Total Cholesterol/HDL:CHD Risk Coronary Heart Disease Risk Table                     Men   Women  1/2 Average Risk   3.4   3.3  Average Risk       5.0   4.4  2 X Average  Risk   9.6   7.1  3 X Average Risk  23.4   11.0        Use the calculated Patient Ratio above and the CHD Risk Table to determine the patient's CHD Risk.        ATP III CLASSIFICATION (LDL):  <100     mg/dL   Optimal  100-129  mg/dL   Near or Above                    Optimal  130-159  mg/dL   Borderline  160-189  mg/dL   High  >190     mg/dL   Very High Performed at [facility] [place], [place] [address]    HDL  Date Value Ref Range Status  10/23/2021 76 >40 mg/dL Final  12/12/2014 84 >39 mg/dL Final    Comment:    According to ATP-III Guidelines, HDL-C >59 mg/dL is considered a negative risk factor for CHD.    Triglycerides  Date Value Ref Range Status  10/23/2021 64 <150 mg/dL Final         Passed - Patient is not pregnant      Passed - Valid encounter within last 12 months    Recent Outpatient Visits           3 months ago Centrilobular emphysema ([place])   [place], DO   7 months ago Centrilobular emphysema  [facility])   [facility] Olin Hauser, DO   11 months ago Centrilobular emphysema [facility])   [facility] Olin Hauser, DO   1 year ago Left ear impacted cerumen   [place], DO   1 year ago Centrilobular emphysema [facility])   [facility] Olin Hauser, DO       Future Appointments             In 1 month Ellyn Hack, Leonie Green, MD [place]. [place]   In 3 months Karamalegos, Devonne Doughty, DO [facility], [facility]             montelukast (SINGULAIR) 10 MG tablet [Pharmacy Med Name: MONTELUKAST SODIUM 10 MG TAB] 90 tablet 3    Sig: TAKE 1 TABLET BY MOUTH AT BEDTIME     Pulmonology:  Leukotriene Inhibitors Passed - 03/14/2022  2:41 PM      Passed - Valid encounter within last 12 months    Recent Outpatient Visits           3 months ago Centrilobular emphysema ([place])   [place], DO   7 months ago Centrilobular emphysema [facility])   [facility] Olin Hauser, DO   11 months ago Centrilobular emphysema [facility])   [place] [facility] Olin Hauser, DO   1 year ago Left ear impacted cerumen   [place], DO   1 year ago Centrilobular emphysema [facility])   [facility] Olin Hauser, DO       Future Appointments             In 1 month Ellyn Hack, Leonie Green, MD [place]. [place]   In 3 months Karamalegos, Devonne Doughty, DO [facility], [facility]

## 2022-04-17 ENCOUNTER — Other Ambulatory Visit: Payer: Self-pay | Admitting: Family Medicine

## 2022-04-17 DIAGNOSIS — I10 Essential (primary) hypertension: Secondary | ICD-10-CM

## 2022-04-20 NOTE — Telephone Encounter (Signed)
Requested Prescriptions  Pending Prescriptions Disp Refills   amLODipine (NORVASC) 10 MG tablet [Pharmacy Med Name: AMLODIPINE BESYLATE 10 MG TAB] 90 tablet 0    Sig: TAKE 1 TABLET BY MOUTH ONCE DAILY     Cardiovascular: Calcium Channel Blockers 2 Passed - 04/17/2022  1:51 PM      Passed - Last BP in normal range    BP Readings from Last 1 Encounters:  12/12/21 (!) 125/57         Passed - Last Heart Rate in normal range    Pulse Readings from Last 1 Encounters:  12/12/21 63         Passed - Valid encounter within last 6 months    Recent Outpatient Visits           4 months ago Centrilobular emphysema [facility])   [facility] [place], Devonne Doughty, DO   8 months ago Centrilobular emphysema [facility])   [facility] Olin Hauser, DO   1 year ago Centrilobular emphysema [facility])   [facility] Olin Hauser, DO   1 year ago Left ear impacted cerumen   [facility] Olin Haus[facility], DO   1 year ago Centrilobular emphysema [facility])   [place], [place], DO       Future Appointments             In 3 days Ellyn Hack, Leonie Green, MD [place] at [place]   In 1 month [place], [facility], [facility]

## 2022-04-23 ENCOUNTER — Ambulatory Visit: Payer: Medicare PPO | Admitting: Cardiology

## 2022-05-05 ENCOUNTER — Emergency Department: Payer: Medicare PPO

## 2022-05-05 ENCOUNTER — Emergency Department
Admission: EM | Admit: 2022-05-05 | Discharge: 2022-05-05 | Disposition: A | Discharge status: Home / Self Care | Payer: Medicare PPO | Attending: Emergency Medicine | Admitting: Emergency Medicine

## 2022-05-05 ENCOUNTER — Encounter: Payer: Self-pay | Admitting: Intensive Care

## 2022-05-05 ENCOUNTER — Other Ambulatory Visit: Payer: Self-pay

## 2022-05-05 DIAGNOSIS — I493 Ventricular premature depolarization: Secondary | ICD-10-CM

## 2022-05-05 DIAGNOSIS — Z853 Personal history of malignant neoplasm of breast: Secondary | ICD-10-CM | POA: Insufficient documentation

## 2022-05-05 DIAGNOSIS — M5136 Other intervertebral disc degeneration, lumbar region: Secondary | ICD-10-CM | POA: Diagnosis not present

## 2022-05-05 DIAGNOSIS — N189 Chronic kidney disease, unspecified: Secondary | ICD-10-CM | POA: Insufficient documentation

## 2022-05-05 DIAGNOSIS — I129 Hypertensive chronic kidney disease with stage 1 through stage 4 chronic kidney disease, or unspecified chronic kidney disease: Secondary | ICD-10-CM | POA: Insufficient documentation

## 2022-05-05 DIAGNOSIS — I498 Other specified cardiac arrhythmias: Secondary | ICD-10-CM

## 2022-05-05 DIAGNOSIS — M5030 Other cervical disc degeneration, unspecified cervical region: Secondary | ICD-10-CM | POA: Diagnosis not present

## 2022-05-05 DIAGNOSIS — M5416 Radiculopathy, lumbar region: Secondary | ICD-10-CM | POA: Diagnosis not present

## 2022-05-05 DIAGNOSIS — R0602 Shortness of breath: Secondary | ICD-10-CM | POA: Diagnosis not present

## 2022-05-05 DIAGNOSIS — R053 Chronic cough: Secondary | ICD-10-CM | POA: Diagnosis not present

## 2022-05-05 DIAGNOSIS — I2510 Atherosclerotic heart disease of native coronary artery without angina pectoris: Secondary | ICD-10-CM | POA: Diagnosis not present

## 2022-05-05 DIAGNOSIS — R008 Other abnormalities of heart beat: Secondary | ICD-10-CM | POA: Diagnosis not present

## 2022-05-05 DIAGNOSIS — R001 Bradycardia, unspecified: Secondary | ICD-10-CM | POA: Diagnosis present

## 2022-05-05 DIAGNOSIS — J9811 Atelectasis: Secondary | ICD-10-CM | POA: Diagnosis not present

## 2022-05-05 LAB — BASIC METABOLIC PANEL
Anion gap: 11 (ref 5–15)
BUN: 19 mg/dL (ref 8–23)
CO2: 24 mmol/L (ref 22–32)
Calcium: 9.1 mg/dL (ref 8.9–10.3)
Chloride: 101 mmol/L (ref 98–111)
Creatinine, Ser: 1.25 mg/dL — ABNORMAL HIGH (ref 0.44–1.00)
GFR, Estimated: 44 mL/min — ABNORMAL LOW (ref >60)
Glucose, Bld: 108 mg/dL — ABNORMAL HIGH (ref 70–99)
Potassium: 3.6 mmol/L (ref 3.5–5.1)
Sodium: 136 mmol/L (ref 135–145)

## 2022-05-05 LAB — CBC
HCT: 43.8 % (ref 36.0–46.0)
Hemoglobin: 14.5 g/dL (ref 12.0–15.0)
MCH: 32.9 pg (ref 26.0–34.0)
MCHC: 33.1 g/dL (ref 30.0–36.0)
MCV: 99.3 fL (ref 80.0–100.0)
Platelets: 303 10*3/uL (ref 150–400)
RBC: 4.41 MIL/uL (ref 3.87–5.11)
RDW: 13.4 % (ref 11.5–15.5)
WBC: 6.1 10*3/uL (ref 4.0–10.5)
nRBC: 0 % (ref 0.0–0.2)

## 2022-05-05 LAB — TROPONIN I (HIGH SENSITIVITY): Troponin I (High Sensitivity): 9 ng/L (ref <18)

## 2022-05-05 LAB — MAGNESIUM: Magnesium: 2.1 mg/dL (ref 1.7–2.4)

## 2022-05-05 NOTE — ED Provider Notes (Signed)
[facility] Provider Note    Event Date/Time   First MD Initiated Contact with Patient 05/05/22 1104     (approximate)   History   Chief Complaint Bradycardia   HPI  Elizabeth Bailey is a 82 y.o. female with past medical history of hypertension, hyperlipidemia, PAD, CKD, COPD, rheumatoid arthritis, and breast cancer who presents to the ED for bradycardia.  Patient reports that she had went to her regularly scheduled appointment with physical medicine and rehabilitation earlier today, was found to have a heart rate in the 30s while there.  She states that she "feels fine" and denies any symptoms with this.  She specifically denies any chest pain, shortness of breath, dizziness, lightheadedness, fevers, cough, vomiting, or diarrhea.  She denies being told that she has a low heart rate in the past, denies significant cardiac history.     Physical Exam   Triage Vital Signs: ED Triage Vitals  Enc Vitals Group     BP      Pulse      Resp      Temp      Temp src      SpO2      Weight      Height      Head Circumference      Peak Flow      Pain Score      Pain Loc      Pain Edu?      Excl. in [place]?     Most recent vital signs: Vitals:   05/05/22 1111 05/05/22 1130  BP: (!) 160/58 (!) 148/86  Pulse:  (!) 59  Resp: 20 (!) 24  Temp: (!) 97.5 F (36.4 C)   SpO2:  98%    Constitutional: Alert and oriented. Eyes: Conjunctivae are normal. Head: Atraumatic. Nose: No congestion/rhinnorhea. Mouth/Throat: Mucous membranes are moist.  Cardiovascular: Normal rate, irregularly irregular rhythm. Grossly normal heart sounds.  2+ radial pulses bilaterally. Respiratory: Normal respiratory effort.  No retractions. Lungs CTAB. Gastrointestinal: Soft and nontender. No distention. Musculoskeletal: No lower extremity tenderness nor edema.  Neurologic:  Normal speech and language. No gross focal neurologic deficits are appreciated.    ED Results / Procedures /  Treatments   Labs (all labs ordered are listed, but only abnormal results are displayed) Labs Reviewed  BASIC METABOLIC PANEL - Abnormal; Notable for the following components:      Result Value   Glucose, Bld 108 (*)    Creatinine, Ser 1.25 (*)    GFR, Estimated 44 (*)    All other components within normal limits  CBC  MAGNESIUM  TROPONIN I (HIGH SENSITIVITY)     EKG  ED ECG REPORT I, Blake Divine, the attending physician, personally viewed and interpreted this ECG.   Date: 05/05/2022  EKG Time: 11:08  Rate: 100  Rhythm: normal sinus rhythm, frequent PVC's noted  Axis: Normal  Intervals:none  ST&T Change: None  RADIOLOGY Chest x-ray reviewed and interpreted by me with no infiltrate, edema, or effusion.  PROCEDURES:  Critical Care performed: No  .1-3 Lead EKG Interpretation  Performed by: Blake Divine, MD Authorized by: Blake Divine, MD     Interpretation: abnormal     ECG rate:  85-100   ECG rate assessment: normal     Rhythm: sinus rhythm     Ectopy: bigeminy and PVCs     Conduction: normal      MEDICATIONS ORDERED IN ED: Medications - No data to display  IMPRESSION / MDM / ASSESSMENT AND PLAN / ED COURSE  I reviewed the triage vital signs and the nursing notes.                              82 y.o. female with past medical history of hypertension, hyperlipidemia, rheumatoid arthritis, PAD, CKD, COPD, and breast cancer who presents to the ED for bradycardia incidentally found at her PM&R follow-up appointment.  Patient's presentation is most consistent with acute presentation with potential threat to life or bodily function.  Differential diagnosis includes, but is not limited to, arrhythmia, heart block, electrolyte abnormality, ACS.  Patient well-appearing and in no acute distress, vital signs are unremarkable and she does not appear bradycardic here in the ED.  She does have frequent PVCs with occasional bigeminy but is otherwise in normal  sinus rhythm with no findings concerning for ischemia.  We will screen for electrolyte abnormality, patient does not appear to take any AV nodal blockers.  She is asymptomatic with these PVCs and blood pressure is reassuring.  The patient is on the cardiac monitor to evaluate for evidence of arrhythmia and/or significant heart rate changes.  Labs are reassuring with stable chronic kidney disease, no significant electrolyte abnormality, anemia, or leukocytosis noted.  Chest x-ray is unremarkable, patient with occasional bigeminy on cardiac monitor along with frequent PVCs but no other concerning findings.  It appears patient was previously evaluated by cardiology for this, will refer back to [facility] cardiology.  She remains asymptomatic here in the ED and is appropriate for outpatient follow-up, was counseled to return to the ED for new or worsening symptoms.  Patient agrees with plan.      FINAL CLINICAL IMPRESSION(S) / ED DIAGNOSES   Final diagnoses:  Ventricular bigeminy  Frequent PVCs     Rx / DC Orders   ED Discharge Orders          Ordered    Ambulatory referral to Cardiology        05/05/22 1213             Note:  This document was prepared using Dragon voice recognition software and may include unintentional dictation errors.   Blake Divine, MD 05/05/22 [phone]

## 2022-05-05 NOTE — ED Notes (Signed)
Pt to xray

## 2022-05-05 NOTE — ED Triage Notes (Signed)
Patient reports she was being seen at [facility] for check up appointment and they found her heart rate to be in the 30s. Brought over by [facility]

## 2022-05-06 DIAGNOSIS — M5416 Radiculopathy, lumbar region: Secondary | ICD-10-CM | POA: Diagnosis not present

## 2022-05-06 DIAGNOSIS — M5136 Other intervertebral disc degeneration, lumbar region: Secondary | ICD-10-CM | POA: Diagnosis not present

## 2022-05-06 DIAGNOSIS — M5412 Radiculopathy, cervical region: Secondary | ICD-10-CM | POA: Diagnosis not present
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
## 2022-05-26 ENCOUNTER — Encounter: Payer: Self-pay | Admitting: Physician Assistant

## 2022-05-26 ENCOUNTER — Ambulatory Visit: Payer: Medicare PPO | Attending: Physician Assistant | Admitting: Cardiology

## 2022-05-26 VITALS — BP 119/81 | HR 72 | Ht 63.0 in | Wt 127.2 lb

## 2022-05-26 DIAGNOSIS — I6529 Occlusion and stenosis of unspecified carotid artery: Secondary | ICD-10-CM | POA: Diagnosis not present

## 2022-05-26 DIAGNOSIS — I10 Essential (primary) hypertension: Secondary | ICD-10-CM

## 2022-05-26 DIAGNOSIS — E785 Hyperlipidemia, unspecified: Secondary | ICD-10-CM | POA: Diagnosis not present

## 2022-05-26 DIAGNOSIS — J432 Centrilobular emphysema: Secondary | ICD-10-CM | POA: Diagnosis not present

## 2022-05-26 DIAGNOSIS — I493 Ventricular premature depolarization: Secondary | ICD-10-CM

## 2022-05-26 NOTE — Patient Instructions (Signed)
Medication Instructions:   Your physician recommends that you continue on your current medications as directed. Please refer to the Current Medication list given to you today.  *If you need a refill on your cardiac medications before your next appointment, please call your pharmacy*   Lab Work: NONE  If you have labs (blood work) drawn today and your tests are completely normal, you will receive your results only by: [place] (if you have MyChart) OR A paper copy in the mail If you have any lab test that is abnormal or we need to change your treatment, we will call you to review the results.   Testing/Procedures:  NONE   Follow-Up: At [facility], you and your health needs are our priority.  As part of our continuing mission to provide you with exceptional heart care, we have created designated Provider Care Teams.  These Care Teams include your primary Cardiologist (physician) and Advanced Practice Providers (APPs -  Physician Assistants and Nurse Practitioners) who all work together to provide you with the care you need, when you need it.  We recommend signing up for the patient portal called "MyChart".  Sign up information is provided on this After Visit Summary.  MyChart is used to connect with patients for Virtual Visits (Telemedicine).  Patients are able to view lab/test results, encounter notes, upcoming appointments, etc.  Non-urgent messages can be sent to your provider as well.   To learn more about what you can do with MyChart, go to [email or web address].    Your next appointment:   3-4 month(s)  Provider:   Glenetta Hew, MD

## 2022-06-12 ENCOUNTER — Ambulatory Visit: Payer: Medicare PPO | Admitting: Cardiology

## 2022-06-15 ENCOUNTER — Encounter: Payer: Self-pay | Admitting: Family Medicine

## 2022-06-15 ENCOUNTER — Ambulatory Visit (INDEPENDENT_AMBULATORY_CARE_PROVIDER_SITE_OTHER): Payer: Medicare PPO | Admitting: Family Medicine

## 2022-06-15 ENCOUNTER — Other Ambulatory Visit: Payer: Self-pay | Admitting: Family Medicine

## 2022-06-15 VITALS — BP 125/86 | HR 95 | Ht 63.0 in | Wt 125.0 lb

## 2022-06-15 DIAGNOSIS — J432 Centrilobular emphysema: Secondary | ICD-10-CM | POA: Diagnosis not present

## 2022-06-15 DIAGNOSIS — E785 Hyperlipidemia, unspecified: Secondary | ICD-10-CM | POA: Diagnosis not present

## 2022-06-15 DIAGNOSIS — F5101 Primary insomnia: Secondary | ICD-10-CM

## 2022-06-15 DIAGNOSIS — I10 Essential (primary) hypertension: Secondary | ICD-10-CM | POA: Diagnosis not present

## 2022-06-15 MED ORDER — ZOLPIDEM TARTRATE 5 MG PO TABS: 5.0000 mg | ORAL_TABLET | Freq: Every evening | ORAL | 5 refills | Status: DC | PRN

## 2022-06-15 NOTE — Progress Notes (Signed)
Subjective:    Patient ID: Elizabeth Bailey, female    DOB: 08-29-1940, 82 y.o.   MRN: [id number]  Elizabeth Bailey is a 82 y.o. female presenting on 06/15/2022 for Medical Management of Chronic Issues   HPI  Returned from staying with son in [place]  History of Breast Cancer   Centrilobular Emphysema Currently no significant complaint with breathing. Using Breztri 2 puffs twice a day missing some doses Has rescue inhaler if need. And Duoneb.   HYPERTENSION Controlled today on BP reading. Checking BP readings at home. Continues on Amlodipine  daily   HLD On Simvastatin   Insomnia Takes Ambien  PRN AT NIGHT with good results. No significant side effect        06/15/2022    9:19 AM 01/31/2021   11:57 AM 01/31/2021   11:54 AM  Depression screen PHQ 2/9  Decreased Interest 0 0 0  Down, Depressed, Hopeless 0 0 0  PHQ - 2 Score 0 0 0  Altered sleeping 0    Tired, decreased energy 0    Change in appetite 0    Feeling bad or failure about yourself  0    Trouble concentrating 0    Moving slowly or fidgety/restless 0    Suicidal thoughts 0    PHQ-9 Score 0        06/15/2022    9:19 AM  GAD 7 : Generalized Anxiety Score  Nervous, Anxious, on Edge 0  Control/stop worrying 0  Worry too much - different things 0  Trouble relaxing 0  Restless 0  Easily annoyed or irritable 0  Afraid - awful might happen 0  Total GAD 7 Score 0      Social History   Tobacco Use   Smoking status: Former    Packs/day: 0.50    Years: 10.00    Additional pack years: 0.00    Total pack years: 5.00    Types: Cigarettes    Quit date: 65    Years since quitting: 36.3   Smokeless tobacco: Never   Tobacco comments:    quit 1988  Vaping Use   Vaping Use: Never used  Substance Use Topics   Alcohol use: Yes    Comment: occasional glass of wine   Drug use: No    Review of Systems Per HPI unless specifically indicated above     Objective:    BP 125/86   Pulse 95   Ht   (1.6 m)   Wt 125 lb (56.7 kg)   SpO2 97%   BMI 22.14 kg/m   Wt Readings from Last 3 Encounters:  06/15/22 125 lb (56.7 kg)  05/26/22 127 lb 3.2 oz (57.7 kg)  05/05/22 120 lb (54.4 kg)    Physical Exam Vitals and nursing note reviewed.  Constitutional:      General: She is not in acute distress.    Appearance: Normal appearance. She is well-developed. She is not diaphoretic.     Comments: Well-appearing, comfortable, cooperative  HENT:     Head: Normocephalic and atraumatic.  Eyes:     General:        Right eye: No discharge.        Left eye: No discharge.     Conjunctiva/sclera: Conjunctivae normal.  Cardiovascular:     Rate and Rhythm: Normal rate.  Pulmonary:     Effort: Pulmonary effort is normal.  Skin:    General: Skin is warm and dry.     Findings:  No erythema or rash.  Neurological:     Mental Status: She is alert and oriented to person, place, and time.  Psychiatric:        Mood and Affect: Mood normal.        Behavior: Behavior normal.        Thought Content: Thought content normal.     Comments: Well groomed, good eye contact, normal speech and thoughts      Results for orders placed or performed during the hospital encounter of 05/05/22  Basic metabolic panel  Result Value Ref Range   Sodium 136 135 - 145 mmol/L   Potassium 3.6 3.5 - 5.1 mmol/L   Chloride 101 98 - 111 mmol/L   CO2 24 22 - 32 mmol/L   Glucose, Bld 108 (H) 70 - 99 mg/dL   BUN 19 8 - 23 mg/dL   Creatinine, Ser 9.62 (H) 0.44 - 1.00 mg/dL   Calcium 9.1 8.9 - 22.9 mg/dL   GFR, Estimated 44 (L) >60 mL/min   Anion gap 11 5 - 15  CBC  Result Value Ref Range   WBC 6.1 4.0 - 10.5 K/uL   RBC 4.41 3.87 - 5.11 MIL/uL   Hemoglobin 14.5 12.0 - 15.0 g/dL   HCT 79.8 92.1 - 19.4 %   MCV 99.3 80.0 - 100.0 fL   MCH 32.9 26.0 - 34.0 pg   MCHC 33.1 30.0 - 36.0 g/dL   RDW 17.4 08.1 - 44.8 %   Platelets 303 150 - 400 K/uL   nRBC 0.0 0.0 - 0.2 %  Magnesium  Result Value Ref Range   Magnesium 2.1  1.7 - 2.4 mg/dL  Troponin I (High Sensitivity)  Result Value Ref Range   Troponin I (High Sensitivity) 9 <18 ng/L      Assessment & Plan:   Problem List Items Addressed This Visit     Essential hypertension (Chronic)   Hyperlipidemia with target LDL less than 100 (Chronic)   Centrilobular emphysema   Insomnia - Primary   Relevant Medications   zolpidem (AMBIEN) 5 MG tablet   Emphysema  Keep on Breztri inhaler 2 puffs twice a day every day instead of AS NEEDED use Has neb if needed Duoneb AS NEEDED  Insomnia Improved on med, tolerating well without issue. Refilled Ambien 5mg  nightly for insomnia  Followed by Pain Management/Rheumatology for RA On pain medication, Humira injection  HYPERTENSION Controlled on current Amlodipine  Meds ordered this encounter  Medications   zolpidem (AMBIEN) 5 MG tablet    Sig: Take 1 tablet (5 mg total) by mouth at bedtime as needed. for sleep    Dispense:  30 tablet    Refill:  5      Follow up plan: Return in about 4 months (around 10/15/2022) for 4 month Annual Physical AM apt fasting lab AFTER.  Saralyn Pilar, DO [facility] 06/15/2022, 9:32 AM

## 2022-06-15 NOTE — Patient Instructions (Addendum)
Thank you for coming to the office today.  Keep on Breztri inhaler 2 puffs twice a day every day.  Refilled Ambien 5mg  nightly for insomnia  DUE for FASTING BLOOD WORK (no food or drink after midnight before the lab appointment, only water or coffee without cream/sugar on the morning of)  Next visit - AFTER visit  Please schedule a Follow-up Appointment to: No follow-ups on file.  If you have any other questions or concerns, please feel free to call the office or send a message through MyChart. You may also schedule an earlier appointment if necessary.  Additionally, you may be receiving a survey about your experience at our office within a few days to 1 week by e-mail or mail. We value your feedback.  Saralyn Pilar, DO [facility], [place]

## 2022-06-16 NOTE — Telephone Encounter (Signed)
Requested medications are due for refill today.  no  Requested medications are on the active medications list.  yes  Last refill. 06/15/2022 #30 5 rf  Future visit scheduled.   yes  Notes to clinic.  Refill and refusal are not delegated.    Requested Prescriptions  Pending Prescriptions Disp Refills   zolpidem (AMBIEN) 5 MG tablet [Pharmacy Med Name: ZOLPIDEM TARTRATE 5 MG TAB] 30 tablet     Sig: TAKE 1 TABLET BY MOUTH AT BEDTIME AS NEEDED FOR SLEEP     Not Delegated - Psychiatry:  Anxiolytics/Hypnotics Failed - 06/15/2022  9:55 AM      Failed - This refill cannot be delegated      Failed - Urine Drug Screen completed in last 360 days      Passed - Valid encounter within last 6 months    Recent Outpatient Visits           Yesterday Primary insomnia   [place] [facility] Smitty Cords, DO   6 months ago Centrilobular emphysema [facility])   [place] [facility] Smitty Cords, DO   10 months ago Centrilobular emphysema [facility])   [place] [facility] Smitty Cords, DO   1 year ago Centrilobular emphysema [facility])   [place] [facility] Smitty Cords, DO   1 year ago Left ear impacted cerumen   [place] [facility] Smitty Cords, DO       Future Appointments             In 3 months Herbie Baltimore, Piedad Climes, MD [facility] [place]   In 4 months Althea Charon, Netta Neat, DO [place] [facility]

## 2022-07-22 ENCOUNTER — Other Ambulatory Visit: Payer: Self-pay | Admitting: Family Medicine

## 2022-07-22 DIAGNOSIS — I10 Essential (primary) hypertension: Secondary | ICD-10-CM

## 2022-07-22 NOTE — Telephone Encounter (Signed)
Requested Prescriptions  Pending Prescriptions Disp Refills   amLODipine (NORVASC) 10 MG tablet [Pharmacy Med Name: AMLODIPINE BESYLATE 10 MG TAB] 90 tablet 0    Sig: TAKE 1 TABLET BY MOUTH ONCE DAILY     Cardiovascular: Calcium Channel Blockers 2 Passed - 07/22/2022 10:03 AM      Passed - Last BP in normal range    BP Readings from Last 1 Encounters:  06/15/22 125/86         Passed - Last Heart Rate in normal range    Pulse Readings from Last 1 Encounters:  06/15/22 95         Passed - Valid encounter within last 6 months    Recent Outpatient Visits           1 month ago Primary insomnia   [facility] Smitty Cords, [place]   7 months ago Centrilobular emphysema [facility])   [facility] Smitty Cords, [place]   11 months ago Centrilobular emphysema [facility])   [place] [facility] Smitty Cords, DO   1 year ago Centrilobular emphysema [facility])   [facility] Smitty Cords, DO   1 year ago Left ear impacted cerumen   [facility] Smitty Cords, [place]       Future Appointments             In 1 month Herbie Baltimore, Piedad Climes, MD [facility] [place]   In 2 months Althea Charon, Netta Neat, DO [place], [facility]

## 2022-08-25 DIAGNOSIS — M5412 Radiculopathy, cervical region: Secondary | ICD-10-CM | POA: Diagnosis not present

## 2022-08-25 DIAGNOSIS — M5416 Radiculopathy, lumbar region: Secondary | ICD-10-CM | POA: Diagnosis not present

## 2022-08-25 DIAGNOSIS — M5136 Other intervertebral disc degeneration, lumbar region: Secondary | ICD-10-CM | POA: Diagnosis not present

## 2022-08-25 DIAGNOSIS — M5030 Other cervical disc degeneration, unspecified cervical region: Secondary | ICD-10-CM | POA: Diagnosis not present

## 2022-09-17 ENCOUNTER — Ambulatory Visit: Payer: Medicare PPO | Admitting: Cardiology

## 2022-10-16 ENCOUNTER — Encounter: Payer: Self-pay | Admitting: Family Medicine

## 2022-10-16 ENCOUNTER — Ambulatory Visit (INDEPENDENT_AMBULATORY_CARE_PROVIDER_SITE_OTHER): Payer: Medicare PPO | Admitting: Family Medicine

## 2022-10-16 VITALS — BP 124/65 | HR 70 | Temp 96.8°F | Ht 60.5 in | Wt 125.2 lb

## 2022-10-16 DIAGNOSIS — M069 Rheumatoid arthritis, unspecified: Secondary | ICD-10-CM

## 2022-10-16 DIAGNOSIS — F5101 Primary insomnia: Secondary | ICD-10-CM

## 2022-10-16 DIAGNOSIS — K219 Gastro-esophageal reflux disease without esophagitis: Secondary | ICD-10-CM

## 2022-10-16 DIAGNOSIS — R7309 Other abnormal glucose: Secondary | ICD-10-CM

## 2022-10-16 DIAGNOSIS — E785 Hyperlipidemia, unspecified: Secondary | ICD-10-CM

## 2022-10-16 DIAGNOSIS — I739 Peripheral vascular disease, unspecified: Secondary | ICD-10-CM | POA: Diagnosis not present

## 2022-10-16 DIAGNOSIS — J432 Centrilobular emphysema: Secondary | ICD-10-CM

## 2022-10-16 DIAGNOSIS — I10 Essential (primary) hypertension: Secondary | ICD-10-CM

## 2022-10-16 DIAGNOSIS — Z0000 Encounter for general adult medical examination without abnormal findings: Secondary | ICD-10-CM | POA: Diagnosis not present

## 2022-10-16 MED ORDER — BREZTRI AEROSPHERE 160-9-4.8 MCG/ACT IN AERO: 2.0000 | INHALATION_SPRAY | Freq: Two times a day (BID) | RESPIRATORY_TRACT | 11 refills | Status: DC

## 2022-10-16 MED ORDER — AMLODIPINE BESYLATE 10 MG PO TABS: 10.0000 mg | ORAL_TABLET | Freq: Every day | ORAL | 3 refills | Status: DC

## 2022-10-16 MED ORDER — OMEPRAZOLE 40 MG PO CPDR: 40.0000 mg | DELAYED_RELEASE_CAPSULE | Freq: Two times a day (BID) | ORAL | 3 refills | Status: DC

## 2022-10-16 MED ORDER — ZOLPIDEM TARTRATE 5 MG PO TABS: 5.0000 mg | ORAL_TABLET | Freq: Every evening | ORAL | 5 refills | Status: DC | PRN

## 2022-10-16 NOTE — Patient Instructions (Addendum)
Thank you for coming to the office today.  Recommend High Dose Flu Vaccine at the pharmacy whenever you are ready, it should be available. We do not have it in stock yet.  All medicines re ordered to [facility].  Let us know if we need to refill other medicines to [facility] in future.  Continue on Breztri inhaler  Blood pressure and oxygen numbers look good  Blood work today, stay tuned for lab results.  Please schedule a Follow-up Appointment to: Return in about 6 months (around 04/18/2023) for 6 month follow-up COPD, HTN, updates.  If you have any other questions or concerns, please feel free to call the office or send a message through MyChart. You may also schedule an earlier appointment if necessary.  Additionally, you may be receiving a survey about your experience at our office within a few days to 1 week by e-mail or mail. We value your feedback.  Saralyn Pilar, DO [facility] [place]

## 2022-10-16 NOTE — Progress Notes (Signed)
Subjective:    Patient ID: Elizabeth Bailey, female    DOB: [birth year], 82 y.o.   MRN: [id number]  Elizabeth Bailey is a 82 y.o. female presenting on 10/16/2022 for Annual Exam   HPI  Here for Annual Physical and Lab Orders  History of Breast Cancer She has completed 5 years on Letrozole 2.5mg  daily - and has been discontinued by Dr Donnalee Curry, this was a topic of recent conversation for refills, and we re-directed her back to [facility] for their opinion since they were managing this breast cancer surveillance and treatment. And based on what she tells me today - she was told she no longer needs it since she has completed 5 years Last mammogram 12/2021   Centrilobular Emphysema Currently no significant complaint with breathing. Using Breztri 2 puffs twice a day with improvement. Has rescue inhaler if need. And Duoneb.   HYPERTENSION Controlled today on BP reading. Checking BP readings at home. Continues on Amlodipine 10mg  daily   HLD On Simvastatin   Insomnia Takes Ambien 5mg  PRN AT NIGHT with good results. No significant side effect   Health Maintenance: Recommend Flu Shot at pharmacy     10/16/2022    8:12 AM 06/15/2022    9:19 AM 01/31/2021   11:57 AM  Depression screen PHQ 2/9  Decreased Interest 0 0 0  Down, Depressed, Hopeless 0 0 0  PHQ - 2 Score 0 0 0  Altered sleeping 0 0   Tired, decreased energy 0 0   Change in appetite 0 0   Feeling bad or failure about yourself  0 0   Trouble concentrating 0 0   Moving slowly or fidgety/restless 0 0   Suicidal thoughts 0 0   PHQ-9 Score 0 0   Difficult doing work/chores Not difficult at all      Past Medical History:  Diagnosis Date   Arthritis    Asthma    Breast cancer (HCC) 03/27/2016   Invasive mammary carcinoma right breast pT1c  pN0 (12 mm) MAMMOPRINT: LOW RISK; ER/PR positive  DCIS medial margin 1 mm   Chronic bronchitis (HCC)    Collagen vascular disease (HCC)    Depression    Diverticulosis    GERD  (gastroesophageal reflux disease)    High cholesterol    Hyperlipidemia with target LDL less than 100 11/28/2014   Hypertension    Irregular heartbeat 04/2021   Zio patch monitor reveals: Frequent PACs (6.5%) and PVCs noted (6.9%) with occasional PAC (1.4%) and PVC couplets (1.2%) and rare triplets.   Personal history of radiation therapy    Seasonal allergies    Past Surgical History:  Procedure Laterality Date   ABDOMINAL HYSTERECTOMY     BREAST BIOPSY  1970's   BREAST BIOPSY Right 03/27/2016   INVASIVE MAMMARY CARCINOMA   BREAST LUMPECTOMY Right 04/09/2016   radiation   BREAST LUMPECTOMY WITH SENTINEL LYMPH NODE BIOPSY Right 04/09/2016   Procedure: BREAST LUMPECTOMY WITH SENTINEL LYMPH NODE BX;  Surgeon: Kieth Brightly, MD;  Location: [facility] ORS;  Service: General;  Laterality: Right;   CAROTID ENDARTERECTOMY Left    CHOLECYSTECTOMY     COLONOSCOPY  2005   DG  BONE DENSITY ([facility] HX)  2010   DIAGNOSTIC MAMMOGRAM  2015   ESOPHAGOGASTRODUODENOSCOPY (EGD) WITH PROPOFOL N/A 05/05/2016   Procedure: ESOPHAGOGASTRODUODENOSCOPY (EGD) WITH PROPOFOL;  Surgeon: Midge Minium, MD;  Location: [facility] ENDOSCOPY;  Service: Endoscopy;  Laterality: N/A;   EUS N/A 03/12/2016   Procedure: FULL  UPPER ENDOSCOPIC ULTRASOUND (EUS) RADIAL;  Surgeon: Bearl Mulberry, MD;  Location: [facility] ENDOSCOPY;  Service: Endoscopy;  Laterality: N/A;   GALLBLADDER SURGERY     PANCREAS SURGERY  01/24/2016   Dr Excell Seltzer   pap smear  2014   REPAIR OF PERFORATED ULCER N/A 01/24/2016   Procedure: Exploratory Laparotomy with biopsy of Pancreas;  Surgeon: Lattie Haw, MD;  Location: [facility] ORS;  Service: General;  Laterality: N/A;   TRANSTHORACIC ECHOCARDIOGRAM  04/03/2021   EF 60 to 65%.  No RWMA.  GR 1 DD.  AOV sclerosis but no stenosis.  Normal RV function, RVP and RAP.  Normal MV.   UPPER GI ENDOSCOPY  03/12/2016   Dr Ree Kida patch  2023   Predominant sinus rhythm: Rate range 50 to 115 bpm.  Average 67  bpm.  6.5% PACs, 6.9% PVCs with occasional PACs and PVCs (1.4 and 1.2%).  Also rare triplets.  3 ventricular runs: Fastest 6 beats 160 bpm, longest 6 beats 1 to 29 bpm.  Frequent (104) atrial runs: Fastest 4 beats 164 bpm, longest 14 beats at 108 bpm   Social History   Socioeconomic History   Marital status: Widowed    Spouse name: Not on file   Number of children: Not on file   Years of education: Not on file   Highest education level: Not on file  Occupational History   Occupation: retired  Tobacco Use   Smoking status: Former    Current packs/day: 0.00    Average packs/day: 0.5 packs/day for 10.0 years (5.0 ttl pk-yrs)    Types: Cigarettes    Start date: 86    Quit date: 1988    Years since quitting: 36.6   Smokeless tobacco: Never   Tobacco comments:    quit 1988  Vaping Use   Vaping status: Never Used  Substance and Sexual Activity   Alcohol use: Not Currently    Comment: occasional glass of wine   Drug use: No   Sexual activity: Not Currently  Other Topics Concern   Not on file  Social History Narrative   Not on file   Social Determinants of Health   Financial Resource Strain: Low Risk  (01/31/2021)   Overall Financial Resource Strain (CARDIA)    Difficulty of Paying Living Expenses: Not hard at all  Food Insecurity: No Food Insecurity (01/31/2021)   Hunger Vital Sign    Worried About Running Out of Food in the Last Year: Never true    Ran Out of Food in the Last Year: Never true  Transportation Needs: No Transportation Needs (01/31/2021)   PRAPARE - Administrator, Civil Service (Medical): No    Lack of Transportation (Non-Medical): No  Physical Activity: Inactive (01/31/2021)   Exercise Vital Sign    Days of Exercise per Week: 0 days    Minutes of Exercise per Session: 0 min  Stress: No Stress Concern Present (01/31/2021)   [employer] of Occupational Health - Occupational Stress Questionnaire    Feeling of Stress : Not at all  Social  Connections: Moderately Isolated (01/31/2021)   Social Connection and Isolation Panel [NHANES]    Frequency of Communication with Friends and Family: More than three times a week    Frequency of Social Gatherings with Friends and Family: More than three times a week    Attends Religious Services: More than 4 times per year    Active Member of Clubs or Organizations: No  Attends Banker Meetings: Never    Marital Status: Widowed  Intimate Partner Violence: Not At Risk (01/31/2021)   Humiliation, Afraid, Rape, and Kick questionnaire    Fear of Current or Ex-Partner: No    Emotionally Abused: No    Physically Abused: No    Sexually Abused: No   Family History  Problem Relation Age of Onset   Pneumonia Mother    Depression Mother    Pulmonary embolism Mother    Tuberculosis Father    Depression Sister    Breast cancer Sister 58   Diabetes Sister    Stroke Brother    Heart disease Brother    Current Outpatient Medications on File Prior to Visit  Medication Sig   acetaminophen (TYLENOL) 325 MG tablet Take 650 mg by mouth every 6 (six) hours as needed for mild pain.   Adalimumab 40 MG/0.8ML PNKT Inject 40 mg into the skin every 21 ( twenty-one) days. TAKES EVERY OTHER WEEK  LAST DOSE WAS IN 01-2016   calcium-vitamin D (OSCAL WITH D) 500-200 MG-UNIT TABS tablet Take by mouth.   diclofenac sodium (VOLTAREN) 1 % GEL Apply 3 grams topically qid prn   fluticasone (FLONASE) 50 MCG/ACT nasal spray Place 2 sprays into both nostrils daily. Use for 4-6 weeks then stop and use seasonally or as needed.   HYDROcodone-acetaminophen (NORCO/VICODIN) 5-325 MG tablet Take 5-325 tablets by mouth 3 times/day as needed-between meals & bedtime.   ipratropium-albuterol (DUONEB) 0.5-2.5 (3) MG/3ML SOLN Take 3 mLs by nebulization every 4 (four) hours as needed.   Melatonin 10 MG TABS Take 2 tablets by mouth at bedtime. Reported on 06/03/2015   montelukast (SINGULAIR) 10 MG tablet TAKE 1 TABLET BY  MOUTH AT BEDTIME   pregabalin (LYRICA) 75 MG capsule Take 1 capsule (75 mg total) by mouth 2 (two) times daily.   simvastatin (ZOCOR) 20 MG tablet TAKE 1 TABLET BY MOUTH AT BEDTIME   No current facility-administered medications on file prior to visit.    Review of Systems  Constitutional:  Negative for activity change, appetite change, chills, diaphoresis, fatigue and fever.  HENT:  Positive for hearing loss. Negative for congestion.   Eyes:  Negative for visual disturbance.  Respiratory:  Positive for shortness of breath. Negative for cough, chest tightness and wheezing.   Cardiovascular:  Negative for chest pain, palpitations and leg swelling.  Gastrointestinal:  Negative for abdominal pain, constipation, diarrhea, nausea and vomiting.  Genitourinary:  Negative for dysuria, frequency and hematuria.  Musculoskeletal:  Positive for arthralgias. Negative for neck pain.  Skin:  Negative for rash.  Neurological:  Negative for dizziness, weakness, light-headedness, numbness and headaches.  Hematological:  Negative for adenopathy.  Psychiatric/Behavioral:  Negative for behavioral problems, dysphoric mood and sleep disturbance.    Per HPI unless specifically indicated above     Objective:    BP 124/65   Pulse 70   Temp (!) 96.8 F (36 C) (Temporal)   Ht 5' 0.5" (1.537 m)   Wt 125 lb 3.2 oz (56.8 kg)   SpO2 96%   BMI 24.05 kg/m   Wt Readings from Last 3 Encounters:  10/16/22 125 lb 3.2 oz (56.8 kg)  06/15/22 125 lb (56.7 kg)  05/26/22 127 lb 3.2 oz (57.7 kg)    Physical Exam Vitals and nursing note reviewed.  Constitutional:      General: She is not in acute distress.    Appearance: Normal appearance. She is well-developed. She is not diaphoretic.  Comments: Elderly 82 yr female, comfortable, cooperative  HENT:     Head: Normocephalic and atraumatic.     Comments: No hearing aids in place, hard of hearing Eyes:     General:        Right eye: No discharge.        Left  eye: No discharge.     Conjunctiva/sclera: Conjunctivae normal.  Cardiovascular:     Rate and Rhythm: Normal rate.  Pulmonary:     Breath sounds: Normal breath sounds. No wheezing or rhonchi.     Comments: Mild increased work of breathing at her baseline Speaks full sentences Musculoskeletal:     Right lower leg: No edema.     Left lower leg: No edema.  Skin:    General: Skin is warm and dry.     Findings: No erythema or rash.  Neurological:     Mental Status: She is alert and oriented to person, place, and time.  Psychiatric:        Mood and Affect: Mood normal.        Behavior: Behavior normal.        Thought Content: Thought content normal.     Comments: Well groomed, good eye contact, normal speech and thoughts      Results for orders placed or performed during the hospital encounter of 05/05/22  Basic metabolic panel  Result Value Ref Range   Sodium 136 135 - 145 mmol/L   Potassium 3.6 3.5 - 5.1 mmol/L   Chloride 101 98 - 111 mmol/L   CO2 24 22 - 32 mmol/L   Glucose, Bld 108 (H) 70 - 99 mg/dL   BUN 19 8 - 23 mg/dL   Creatinine, Ser 8.65 (H) 0.44 - 1.00 mg/dL   Calcium 9.1 8.9 - 78.4 mg/dL   GFR, Estimated 44 (L) >60 mL/min   Anion gap 11 5 - 15  CBC  Result Value Ref Range   WBC 6.1 4.0 - 10.5 K/uL   RBC 4.41 3.87 - 5.11 MIL/uL   Hemoglobin 14.5 12.0 - 15.0 g/dL   HCT 69.6 29.5 - 28.4 %   MCV 99.3 80.0 - 100.0 fL   MCH 32.9 26.0 - 34.0 pg   MCHC 33.1 30.0 - 36.0 g/dL   RDW 13.2 44.0 - 10.2 %   Platelets 303 150 - 400 K/uL   nRBC 0.0 0.0 - 0.2 %  Magnesium  Result Value Ref Range   Magnesium 2.1 1.7 - 2.4 mg/dL  Troponin I (High Sensitivity)  Result Value Ref Range   Troponin I (High Sensitivity) 9 <18 ng/L      Assessment & Plan:   Problem List Items Addressed This Visit     Essential hypertension (Chronic)   Relevant Medications   amLODipine (NORVASC) 10 MG tablet   Other Relevant Orders   COMPLETE METABOLIC PANEL WITH GFR   Lipid panel   CBC  with Differential/Platelet   Hyperlipidemia with target LDL less than 100 (Chronic)   Relevant Medications   amLODipine (NORVASC) 10 MG tablet   Other Relevant Orders   Lipid panel   TSH   PAD (peripheral artery disease) (HCC) (Chronic)   Relevant Medications   amLODipine (NORVASC) 10 MG tablet   Centrilobular emphysema (HCC)   Relevant Medications   BREZTRI AEROSPHERE 160-9-4.8 MCG/ACT AERO   Gastroesophageal reflux disease without esophagitis   Relevant Medications   omeprazole (PRILOSEC) 40 MG capsule   Insomnia   Relevant Medications   zolpidem (AMBIEN) 5  MG tablet   Rheumatoid arthritis (HCC)   Other Visit Diagnoses     Annual physical exam    -  Primary   Relevant Orders   COMPLETE METABOLIC PANEL WITH GFR   Hemoglobin A1c   Lipid panel   CBC with Differential/Platelet   TSH   Abnormal glucose       Relevant Orders   Hemoglobin A1c       Updated Health Maintenance information Reviewed recent lab results with patient Encouraged improvement to lifestyle with diet and exercise Goal of weight loss  Recommend High Dose Flu Vaccine at the pharmacy whenever you are ready, it should be available. We do not have it in stock yet.  All medicines re ordered to [facility].  Let us know if we need to refill other medicines to [facility] in future.  Continue on Breztri inhaler  Blood pressure and oxygen numbers look good  Blood work today, stay tuned for lab results.   Meds ordered this encounter  Medications   amLODipine (NORVASC) 10 MG tablet    Sig: Take 1 tablet (10 mg total) by mouth daily.    Dispense:  90 tablet    Refill:  3   BREZTRI AEROSPHERE 160-9-4.8 MCG/ACT AERO    Sig: Inhale 2 puffs into the lungs 2 (two) times daily.    Dispense:  10.7 g    Refill:  11   zolpidem (AMBIEN) 5 MG tablet    Sig: Take 1 tablet (5 mg total) by mouth at bedtime as needed. for sleep    Dispense:  30 tablet    Refill:  5   omeprazole (PRILOSEC) 40 MG capsule     Sig: Take 1 capsule (40 mg total) by mouth 2 (two) times daily.    Dispense:  180 capsule    Refill:  3      Follow up plan: Return in about 6 months (around 04/18/2023) for 6 month follow-up COPD, HTN, updates.  Saralyn Pilar, DO [facility] 10/16/2022, 8:35 AM

## 2022-10-17 LAB — COMPLETE METABOLIC PANEL WITH GFR
AG Ratio: 1.4 (calc) (ref 1.0–2.5)
ALT: 6 U/L (ref 6–29)
AST: 13 U/L (ref 10–35)
Albumin: 4.2 g/dL (ref 3.6–5.1)
Alkaline phosphatase (APISO): 77 U/L (ref 37–153)
BUN/Creatinine Ratio: 16 (calc) (ref 6–22)
BUN: 17 mg/dL (ref 7–25)
CO2: 26 mmol/L (ref 20–32)
Calcium: 9.8 mg/dL (ref 8.6–10.4)
Chloride: 101 mmol/L (ref 98–110)
Creat: 1.09 mg/dL — ABNORMAL HIGH (ref 0.60–0.95)
Globulin: 3.1 g/dL (ref 1.9–3.7)
Glucose, Bld: 100 mg/dL (ref 65–139)
Potassium: 5.5 mmol/L — ABNORMAL HIGH (ref 3.5–5.3)
Sodium: 138 mmol/L (ref 135–146)
Total Bilirubin: 1 mg/dL (ref 0.2–1.2)
Total Protein: 7.3 g/dL (ref 6.1–8.1)
eGFR: 51 mL/min/{1.73_m2} — ABNORMAL LOW (ref >=60)

## 2022-10-17 LAB — CBC WITH DIFFERENTIAL/PLATELET
Absolute Monocytes: 500 {cells}/uL (ref 200–950)
Basophils Absolute: 20 {cells}/uL (ref 0–200)
Basophils Relative: 0.4 %
Eosinophils Absolute: 180 {cells}/uL (ref 15–500)
Eosinophils Relative: 3.6 %
HCT: 43.7 % (ref 35.0–45.0)
Hemoglobin: 14.7 g/dL (ref 11.7–15.5)
Lymphs Abs: 1150 {cells}/uL (ref 850–3900)
MCH: 33.3 pg — ABNORMAL HIGH (ref 27.0–33.0)
MCHC: 33.6 g/dL (ref 32.0–36.0)
MCV: 99.1 fL (ref 80.0–100.0)
MPV: 10.5 fL (ref 7.5–12.5)
Monocytes Relative: 10 %
Neutro Abs: 3150 {cells}/uL (ref 1500–7800)
Neutrophils Relative %: 63 %
Platelets: 285 10*3/uL (ref 140–400)
RBC: 4.41 10*6/uL (ref 3.80–5.10)
RDW: 12.6 % (ref 11.0–15.0)
Total Lymphocyte: 23 %
WBC: 5 10*3/uL (ref 3.8–10.8)

## 2022-10-17 LAB — HEMOGLOBIN A1C
Hgb A1c MFr Bld: 5.8 %{Hb} — ABNORMAL HIGH (ref <5.7)
Mean Plasma Glucose: 120 mg/dL
eAG (mmol/L): 6.6 mmol/L

## 2022-10-17 LAB — LIPID PANEL
Cholesterol: 183 mg/dL (ref <200)
HDL: 64 mg/dL (ref >=50)
LDL Cholesterol (Calc): 96 mg/dL
Non-HDL Cholesterol (Calc): 119 mg/dL (ref <130)
Total CHOL/HDL Ratio: 2.9 (calc) (ref <5.0)
Triglycerides: 136 mg/dL (ref <150)

## 2022-10-17 LAB — TSH: TSH: 2.15 m[IU]/L (ref 0.40–4.50)

## 2022-10-28 ENCOUNTER — Telehealth: Payer: Self-pay

## 2022-10-28 NOTE — Telephone Encounter (Signed)
-----   Message from Saralyn Pilar sent at 10/19/2022  5:55 PM EDT ----- Please contact patient to review the following (No MyChart Access):  1. Chemistry - stable kidney function. Elevated potassium due to kidneys. Goal is to limit excess potassium in diet.  2. 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 Not a significant concern at this time. No medicines required  3. TSH Thyroid Function Tests - Normal.  4. Cholesterol - Controlled on medicine  5. CBC Blood Counts - Normal, no anemia, other abnormality  Saralyn Pilar, DO [facility] 10/19/2022, 5:54 PM

## 2022-10-28 NOTE — Telephone Encounter (Signed)
Left message for patient to call back to discuss results

## 2022-11-03 ENCOUNTER — Telehealth: Payer: Self-pay | Admitting: Family Medicine

## 2022-11-03 NOTE — Telephone Encounter (Signed)
LM 11/02/2022 to schedule AWV   Elizabeth Bailey; Care Guide Ambulatory Clinical Support [place] [facility] Direct Dial: [phone]

## 2023-04-07 ENCOUNTER — Ambulatory Visit: Payer: Medicare PPO | Admitting: Family Medicine

## 2023-04-07 ENCOUNTER — Encounter: Payer: Self-pay | Admitting: Family Medicine

## 2023-04-07 DIAGNOSIS — J302 Other seasonal allergic rhinitis: Secondary | ICD-10-CM

## 2023-04-07 DIAGNOSIS — E782 Mixed hyperlipidemia: Secondary | ICD-10-CM

## 2023-04-07 DIAGNOSIS — F5101 Primary insomnia: Secondary | ICD-10-CM

## 2023-04-07 MED ORDER — MONTELUKAST SODIUM 10 MG PO TABS: 10.0000 mg | ORAL_TABLET | Freq: Every day | ORAL | 3 refills | Status: AC

## 2023-04-07 MED ORDER — SIMVASTATIN 20 MG PO TABS: 20.0000 mg | ORAL_TABLET | Freq: Every day | ORAL | 3 refills | Status: AC

## 2023-04-07 MED ORDER — ZOLPIDEM TARTRATE 5 MG PO TABS: 5.0000 mg | ORAL_TABLET | Freq: Every evening | ORAL | 5 refills | Status: DC | PRN

## 2023-04-07 NOTE — Patient Instructions (Addendum)
 Thank you for coming to the office today.  Refilled medicines  DUE for FASTING BLOOD WORK (no food or drink after midnight before the lab appointment, only water or coffee without cream/sugar on the morning of)  SCHEDULE Lab Only visit in the morning at the clinic for lab draw in 6  MONTHS   - Make sure Lab Only appointment is at about 1 week before your next appointment, so that results will be available  For Lab Results, once available within 2-3 days of blood draw, you can can log in to MyChart online to view your results and a brief explanation. Also, we can discuss results at next follow-up visit.    Please schedule a Follow-up Appointment to: Return in about 6 months (around 10/05/2023) for 6 mo, annual physical lab after.  If you have any other questions or concerns, please feel free to call the office or send a message through MyChart. You may also schedule an earlier appointment if necessary.  Additionally, you may be receiving a survey about your experience at our office within a few days to 1 week by e-mail or mail. We value your feedback.  Marsa Officer, DO [facility], [place]

## 2023-04-07 NOTE — Progress Notes (Signed)
 Subjective:    Patient ID: Elizabeth Bailey, female    DOB: 06-03-1940, 83 y.o.   MRN: [id number]  Elizabeth Bailey is a 83 y.o. female presenting on 04/07/2023 for COPD and Hypertension   HPI  Discussed the use of AI scribe software for clinical note transcription with the patient, who gave verbal consent to proceed.  History of Present Illness    Elizabeth Bailey is an 83 year old female who presents for a six-month follow-up visit.  She mentions that she did not bring her hearing aids to the appointment, which affects her ability to hear during the conversation.     Centrilobular Emphysema Currently no significant complaint with breathing. Using Breztri  2 puffs twice a day with improvement. Has rescue inhaler if need. And Duoneb.   HYPERTENSION Controlled today on BP reading. Checking BP readings at home. Continues on Amlodipine  10mg  daily   HLD On Simvastatin    Insomnia Takes Ambien  5mg  PRN AT NIGHT with good results. No significant side effect  Pain management per Dr Avanell PRN     10/16/2022    8:12 AM 06/15/2022    9:19 AM 01/31/2021   11:57 AM  Depression screen PHQ 2/9  Decreased Interest 0 0 0  Down, Depressed, Hopeless 0 0 0  PHQ - 2 Score 0 0 0  Altered sleeping 0 0   Tired, decreased energy 0 0   Change in appetite 0 0   Feeling bad or failure about yourself  0 0   Trouble concentrating 0 0   Moving slowly or fidgety/restless 0 0   Suicidal thoughts 0 0   PHQ-9 Score 0 0   Difficult doing work/chores Not difficult at all         10/16/2022    8:12 AM 06/15/2022    9:19 AM  GAD 7 : Generalized Anxiety Score  Nervous, Anxious, on Edge 0 0  Control/stop worrying 0 0  Worry too much - different things 0 0  Trouble relaxing 0 0  Restless 0 0  Easily annoyed or irritable 0 0  Afraid - awful might happen 0 0  Total GAD 7 Score 0 0  Anxiety Difficulty Not difficult at all     Social History   Tobacco Use   Smoking status: Former    Current  packs/day: 0.00    Average packs/day: 0.5 packs/day for 10.0 years (5.0 ttl pk-yrs)    Types: Cigarettes    Start date: 37    Quit date: 1988    Years since quitting: 37.1   Smokeless tobacco: Never   Tobacco comments:    quit 1988  Vaping Use   Vaping status: Never Used  Substance Use Topics   Alcohol  use: Not Currently    Comment: occasional glass of wine   Drug use: No    Review of Systems Per HPI unless specifically indicated above     Objective:    BP 130/70   Pulse 64   Ht 5' 0.5 (1.537 m)   Wt 119 lb (54 kg)   SpO2 96%   BMI 22.86 kg/m   Wt Readings from Last 3 Encounters:  04/07/23 119 lb (54 kg)  10/16/22 125 lb 3.2 oz (56.8 kg)  06/15/22 125 lb (56.7 kg)    Physical Exam Vitals and nursing note reviewed.  Constitutional:      General: She is not in acute distress.    Appearance: Normal appearance. She is well-developed. She is not diaphoretic.  Comments: Elderly 83 yr female, comfortable, cooperative  HENT:     Head: Normocephalic and atraumatic.     Comments: No hearing aids in place, hard of hearing Eyes:     General:        Right eye: No discharge.        Left eye: No discharge.     Conjunctiva/sclera: Conjunctivae normal.  Cardiovascular:     Rate and Rhythm: Normal rate.  Pulmonary:     Breath sounds: Normal breath sounds. No wheezing or rhonchi.     Comments: Mild increased work of breathing at her baseline Speaks full sentences Musculoskeletal:     Right lower leg: No edema.     Left lower leg: No edema.  Skin:    General: Skin is warm and dry.     Findings: No erythema or rash.  Neurological:     Mental Status: She is alert and oriented to person, place, and time.  Psychiatric:        Mood and Affect: Mood normal.        Behavior: Behavior normal.        Thought Content: Thought content normal.     Comments: Well groomed, good eye contact, normal speech and thoughts     Results for orders placed or performed in visit on  10/16/22  COMPLETE METABOLIC PANEL WITH GFR   Collection Time: 10/16/22  9:03 AM  Result Value Ref Range   Glucose, Bld 100 65 - 139 mg/dL   BUN 17 7 - 25 mg/dL   Creat 8.90 (H) 9.39 - 0.95 mg/dL   eGFR 51 (L) > OR = 60 mL/min/1.34m2   BUN/Creatinine Ratio 16 6 - 22 (calc)   Sodium 138 135 - 146 mmol/L   Potassium 5.5 (H) 3.5 - 5.3 mmol/L   Chloride 101 98 - 110 mmol/L   CO2 26 20 - 32 mmol/L   Calcium  9.8 8.6 - 10.4 mg/dL   Total Protein 7.3 6.1 - 8.1 g/dL   Albumin 4.2 3.6 - 5.1 g/dL   Globulin 3.1 1.9 - 3.7 g/dL (calc)   AG Ratio 1.4 1.0 - 2.5 (calc)   Total Bilirubin 1.0 0.2 - 1.2 mg/dL   Alkaline phosphatase (APISO) 77 37 - 153 U/L   AST 13 10 - 35 U/L   ALT 6 6 - 29 U/L  Hemoglobin A1c   Collection Time: 10/16/22  9:03 AM  Result Value Ref Range   Hgb A1c MFr Bld 5.8 (H) <5.7 % of total Hgb   Mean Plasma Glucose 120 mg/dL   eAG (mmol/L) 6.6 mmol/L  Lipid panel   Collection Time: 10/16/22  9:03 AM  Result Value Ref Range   Cholesterol 183 <200 mg/dL   HDL 64 > OR = 50 mg/dL   Triglycerides 863 <849 mg/dL   LDL Cholesterol (Calc) 96 mg/dL (calc)   Total CHOL/HDL Ratio 2.9 <5.0 (calc)   Non-HDL Cholesterol (Calc) 119 <130 mg/dL (calc)  CBC with Differential/Platelet   Collection Time: 10/16/22  9:03 AM  Result Value Ref Range   WBC 5.0 3.8 - 10.8 Thousand/uL   RBC 4.41 3.80 - 5.10 Million/uL   Hemoglobin 14.7 11.7 - 15.5 g/dL   HCT 56.2 64.9 - 54.9 %   MCV 99.1 80.0 - 100.0 fL   MCH 33.3 (H) 27.0 - 33.0 pg   MCHC 33.6 32.0 - 36.0 g/dL   RDW 87.3 88.9 - 84.9 %   Platelets 285 140 - 400 Thousand/uL  MPV 10.5 7.5 - 12.5 fL   Neutro Abs 3,150 1,500 - 7,800 cells/uL   Lymphs Abs 1,150 850 - 3,900 cells/uL   Absolute Monocytes 500 200 - 950 cells/uL   Eosinophils Absolute 180 15 - 500 cells/uL   Basophils Absolute 20 0 - 200 cells/uL   Neutrophils Relative % 63 %   Total Lymphocyte 23.0 %   Monocytes Relative 10.0 %   Eosinophils Relative 3.6 %   Basophils  Relative 0.4 %  TSH   Collection Time: 10/16/22  9:03 AM  Result Value Ref Range   TSH 2.15 0.40 - 4.50 mIU/L      Assessment & Plan:   Problem List Items Addressed This Visit     Insomnia   Relevant Medications   zolpidem  (AMBIEN ) 5 MG tablet   Other Visit Diagnoses       Seasonal allergic rhinitis, unspecified trigger       Relevant Medications   montelukast  (SINGULAIR ) 10 MG tablet     Mixed hyperlipidemia       Relevant Medications   simvastatin  (ZOCOR ) 20 MG tablet         Chronic Pain Managed with Lyrica  and Hydrocodone . Hydrocodone  managed by Dr. Avanell. -Continue current regimen.  Insomnia Managed with Ambien . Tolerating well without complication or side effect -Renew Ambien  prescription.  Centrilobular Emphysema COPD Managed with Breztri  inhaler and Singulair . -Renew Singulair  prescription.  Hypertension Well controlled with current regimen. Blood pressure 130/70 today. -Continue current regimen.  General Health Maintenance / Followup Plans -Schedule follow-up appointment in six months. -Order labs prior to next visit.         No orders of the defined types were placed in this encounter.   Meds ordered this encounter  Medications   montelukast  (SINGULAIR ) 10 MG tablet    Sig: Take 1 tablet (10 mg total) by mouth at bedtime.    Dispense:  90 tablet    Refill:  3   simvastatin  (ZOCOR ) 20 MG tablet    Sig: Take 1 tablet (20 mg total) by mouth at bedtime.    Dispense:  90 tablet    Refill:  3   zolpidem  (AMBIEN ) 5 MG tablet    Sig: Take 1 tablet (5 mg total) by mouth at bedtime as needed. for sleep    Dispense:  30 tablet    Refill:  5    Follow up plan: Return in about 6 months (around 10/05/2023) for 6 mo, annual physical lab after.    Marsa Officer, DO [facility] 04/07/2023, 4:02 PM

## 2023-04-20 ENCOUNTER — Ambulatory Visit: Payer: Self-pay | Admitting: Family Medicine

## 2023-08-21 ENCOUNTER — Other Ambulatory Visit: Payer: Self-pay | Admitting: Family Medicine

## 2023-08-21 DIAGNOSIS — I10 Essential (primary) hypertension: Secondary | ICD-10-CM

## 2023-08-24 NOTE — Telephone Encounter (Signed)
 Requested Prescriptions  Pending Prescriptions Disp Refills   amLODipine  (NORVASC ) 10 MG tablet [Pharmacy Med Name: AMLODIPINE  BESYLATE 10 MG TAB] 90 tablet 0    Sig: TAKE 1 TABLET BY MOUTH ONCE DAILY     Cardiovascular: Calcium  Channel Blockers 2 Passed - 08/24/2023 11:10 AM      Passed - Last BP in normal range    BP Readings from Last 1 Encounters:  04/07/23 130/70         Passed - Last Heart Rate in normal range    Pulse Readings from Last 1 Encounters:  04/07/23 64         Passed - Valid encounter within last 6 months    Recent Outpatient Visits           4 months ago Seasonal allergic rhinitis, unspecified trigger   [facility] [place], Marsa PARAS, DO       Future Appointments             In 1 month Edman, Marsa PARAS, DO [facility], [facility]

## 2023-10-06 ENCOUNTER — Ambulatory Visit: Payer: Medicare PPO | Admitting: Family Medicine

## 2023-10-25 ENCOUNTER — Other Ambulatory Visit: Payer: Self-pay | Admitting: Family Medicine

## 2023-10-25 DIAGNOSIS — F5101 Primary insomnia: Secondary | ICD-10-CM

## 2023-10-26 ENCOUNTER — Ambulatory Visit: Admitting: Family Medicine

## 2023-10-26 ENCOUNTER — Encounter: Payer: Self-pay | Admitting: Family Medicine

## 2023-10-26 ENCOUNTER — Other Ambulatory Visit: Payer: Self-pay | Admitting: Family Medicine

## 2023-10-26 DIAGNOSIS — I739 Peripheral vascular disease, unspecified: Secondary | ICD-10-CM

## 2023-10-26 DIAGNOSIS — J432 Centrilobular emphysema: Secondary | ICD-10-CM

## 2023-10-26 DIAGNOSIS — F5101 Primary insomnia: Secondary | ICD-10-CM | POA: Diagnosis not present

## 2023-10-26 DIAGNOSIS — K219 Gastro-esophageal reflux disease without esophagitis: Secondary | ICD-10-CM

## 2023-10-26 DIAGNOSIS — I10 Essential (primary) hypertension: Secondary | ICD-10-CM

## 2023-10-26 DIAGNOSIS — R7309 Other abnormal glucose: Secondary | ICD-10-CM

## 2023-10-26 DIAGNOSIS — E782 Mixed hyperlipidemia: Secondary | ICD-10-CM

## 2023-10-26 DIAGNOSIS — Z0000 Encounter for general adult medical examination without abnormal findings: Secondary | ICD-10-CM

## 2023-10-26 MED ORDER — OMEPRAZOLE 40 MG PO CPDR: 40.0000 mg | DELAYED_RELEASE_CAPSULE | Freq: Two times a day (BID) | ORAL | 3 refills | Status: AC

## 2023-10-26 MED ORDER — AMLODIPINE BESYLATE 10 MG PO TABS: 10.0000 mg | ORAL_TABLET | Freq: Every day | ORAL | 3 refills | Status: AC

## 2023-10-26 MED ORDER — ZOLPIDEM TARTRATE 5 MG PO TABS: 5.0000 mg | ORAL_TABLET | Freq: Every evening | ORAL | 5 refills | Status: AC | PRN

## 2023-10-26 MED ORDER — BREZTRI AEROSPHERE 160-9-4.8 MCG/ACT IN AERO: 2.0000 | INHALATION_SPRAY | Freq: Two times a day (BID) | RESPIRATORY_TRACT | 11 refills | Status: AC

## 2023-10-26 NOTE — Progress Notes (Signed)
 Subjective:    Patient ID: Elizabeth Bailey, female    DOB: 02-14-1941, 83 y.o.   MRN: [id number]  Elizabeth Bailey is a 83 y.o. female presenting on 10/26/2023 for Medical Management of Chronic Issues and Insomnia   HPI  Discussed the use of AI scribe software for clinical note transcription with the patient, who gave verbal consent to proceed.  History of Present Illness   Elizabeth Bailey is an 83 year old female who presents for a follow-up regarding her sleep medication management.  Insomnia - Takes Ambien  5 mg, two tablets nightly for sleep, despite prescription for one tablet per night. She has extra meds from past available. - Usually falls asleep after taking Ambien , but occasionally falls asleep without it not taking it every night  COPD Emphysema Chronic Dyspnea - Uses Breztri  inhaler to assist with breathing, for maintenance Has Duoneb AS NEEDED if need  No longer on pain medication regularly  GERD On Omeprazole  40 TWICE A DAY needs re order.     10/16/2022    8:12 AM 06/15/2022    9:19 AM 01/31/2021   11:57 AM  Depression screen PHQ 2/9  Decreased Interest 0 0 0  Down, Depressed, Hopeless 0 0 0  PHQ - 2 Score 0 0 0  Altered sleeping 0 0   Tired, decreased energy 0 0   Change in appetite 0 0   Feeling bad or failure about yourself  0 0   Trouble concentrating 0 0   Moving slowly or fidgety/restless 0 0   Suicidal thoughts 0 0   PHQ-9 Score 0 0   Difficult doing work/chores Not difficult at all         10/16/2022    8:12 AM 06/15/2022    9:19 AM  GAD 7 : Generalized Anxiety Score  Nervous, Anxious, on Edge 0 0  Control/stop worrying 0 0  Worry too much - different things 0 0  Trouble relaxing 0 0  Restless 0 0  Easily annoyed or irritable 0 0  Afraid - awful might happen 0 0  Total GAD 7 Score 0 0  Anxiety Difficulty Not difficult at all     Social History   Tobacco Use   Smoking status: Former    Current packs/day: 0.00    Average packs/day: 0.5  packs/day for 10.0 years (5.0 ttl pk-yrs)    Types: Cigarettes    Start date: 19    Quit date: 39    Years since quitting: 37.6   Smokeless tobacco: Never   Tobacco comments:    quit 1988  Vaping Use   Vaping status: Never Used  Substance Use Topics   Alcohol  use: Not Currently    Comment: occasional glass of wine   Drug use: No    Review of Systems Per HPI unless specifically indicated above     Objective:    BP 122/70 (BP Location: Left Arm, Patient Position: Sitting, Cuff Size: Normal)   Pulse (!) 56   Ht 5' 0.5 (1.537 m)   Wt 109 lb 6 oz (49.6 kg)   SpO2 94%   BMI 21.01 kg/m   Wt Readings from Last 3 Encounters:  10/26/23 109 lb 6 oz (49.6 kg)  04/07/23 119 lb (54 kg)  10/16/22 125 lb 3.2 oz (56.8 kg)    Physical Exam Vitals and nursing note reviewed.  Constitutional:      General: She is not in acute distress.    Appearance: Normal appearance.  She is well-developed. She is not diaphoretic.     Comments: Well-appearing, comfortable, cooperative, elderly 80  HENT:     Head: Normocephalic and atraumatic.  Eyes:     General:        Right eye: No discharge.        Left eye: No discharge.     Conjunctiva/sclera: Conjunctivae normal.  Cardiovascular:     Rate and Rhythm: Normal rate.  Pulmonary:     Breath sounds: Normal breath sounds. No wheezing or rales.     Comments: Reduced air movement at baseline and some mild increased work of breathing again at baseline. Skin:    General: Skin is warm and dry.     Findings: No erythema or rash.  Neurological:     Mental Status: She is alert and oriented to person, place, and time.  Psychiatric:        Mood and Affect: Mood normal.        Behavior: Behavior normal.        Thought Content: Thought content normal.     Comments: Well groomed, good eye contact, normal speech and thoughts     Results for orders placed or performed in visit on 10/16/22  COMPLETE METABOLIC PANEL WITH GFR   Collection Time:  10/16/22  9:03 AM  Result Value Ref Range   Glucose, Bld 100 65 - 139 mg/dL   BUN 17 7 - 25 mg/dL   Creat 8.90 (H) 9.39 - 0.95 mg/dL   eGFR 51 (L) > OR = 60 mL/min/1.3m2   BUN/Creatinine Ratio 16 6 - 22 (calc)   Sodium 138 135 - 146 mmol/L   Potassium 5.5 (H) 3.5 - 5.3 mmol/L   Chloride 101 98 - 110 mmol/L   CO2 26 20 - 32 mmol/L   Calcium  9.8 8.6 - 10.4 mg/dL   Total Protein 7.3 6.1 - 8.1 g/dL   Albumin 4.2 3.6 - 5.1 g/dL   Globulin 3.1 1.9 - 3.7 g/dL (calc)   AG Ratio 1.4 1.0 - 2.5 (calc)   Total Bilirubin 1.0 0.2 - 1.2 mg/dL   Alkaline phosphatase (APISO) 77 37 - 153 U/L   AST 13 10 - 35 U/L   ALT 6 6 - 29 U/L  Hemoglobin A1c   Collection Time: 10/16/22  9:03 AM  Result Value Ref Range   Hgb A1c MFr Bld 5.8 (H) <5.7 % of total Hgb   Mean Plasma Glucose 120 mg/dL   eAG (mmol/L) 6.6 mmol/L  Lipid panel   Collection Time: 10/16/22  9:03 AM  Result Value Ref Range   Cholesterol 183 <200 mg/dL   HDL 64 > OR = 50 mg/dL   Triglycerides 863 <849 mg/dL   LDL Cholesterol (Calc) 96 mg/dL (calc)   Total CHOL/HDL Ratio 2.9 <5.0 (calc)   Non-HDL Cholesterol (Calc) 119 <130 mg/dL (calc)  CBC with Differential/Platelet   Collection Time: 10/16/22  9:03 AM  Result Value Ref Range   WBC 5.0 3.8 - 10.8 Thousand/uL   RBC 4.41 3.80 - 5.10 Million/uL   Hemoglobin 14.7 11.7 - 15.5 g/dL   HCT 56.2 64.9 - 54.9 %   MCV 99.1 80.0 - 100.0 fL   MCH 33.3 (H) 27.0 - 33.0 pg   MCHC 33.6 32.0 - 36.0 g/dL   RDW 87.3 88.9 - 84.9 %   Platelets 285 140 - 400 Thousand/uL   MPV 10.5 7.5 - 12.5 fL   Neutro Abs 3,150 1,500 - 7,800 cells/uL   Lymphs  Abs 1,150 850 - 3,900 cells/uL   Absolute Monocytes 500 200 - 950 cells/uL   Eosinophils Absolute 180 15 - 500 cells/uL   Basophils Absolute 20 0 - 200 cells/uL   Neutrophils Relative % 63 %   Total Lymphocyte 23.0 %   Monocytes Relative 10.0 %   Eosinophils Relative 3.6 %   Basophils Relative 0.4 %  TSH   Collection Time: 10/16/22  9:03 AM  Result  Value Ref Range   TSH 2.15 0.40 - 4.50 mIU/L      Assessment & Plan:   Problem List Items Addressed This Visit     Essential hypertension (Chronic)   Relevant Medications   amLODipine  (NORVASC ) 10 MG tablet   Centrilobular emphysema (HCC)   Relevant Medications   BREZTRI  AEROSPHERE 160-9-4.8 MCG/ACT AERO inhaler   Gastroesophageal reflux disease without esophagitis   Relevant Medications   omeprazole  (PRILOSEC) 40 MG capsule   Insomnia   Relevant Medications   zolpidem  (AMBIEN ) 5 MG tablet      Emphysema / Chronic obstructive pulmonary disease (COPD) COPD managed with inhalers. - Order Breztri  inhaler for maintenance - has Duoneb AS NEEDED if need  Hypertension Controlled on Amlodipine  10mg  daily  Gastroesophageal reflux disease (GERD) Controlled on Omeprazole  40mg  TWICE A DAY Refill  Insomnia Chronic insomnia managed with Ambien . She has been taking two 5 mg tablets nightly, although only one was prescribed. This may not be safe and could lead to grogginess or hangover effects. Insurance may not approve two pills nightly. She has had extra stock of pills so has taken 2 instead of 1. Which equals dose 10mg . She does tolerate it well  Discussion on dosing and safety, advise that I would suggest lower back to 5mg  nightly to see if this is sufficient - Prescribe Ambien  5 mg, one tablet at bedtime for six months. - Instruct to try taking one tablet nightly and call if ineffective.  We can try to get 2 approved or change dose to 10mg  if need in future.      No orders of the defined types were placed in this encounter.   Meds ordered this encounter  Medications   zolpidem  (AMBIEN ) 5 MG tablet    Sig: Take 1 tablet (5 mg total) by mouth at bedtime as needed. for sleep    Dispense:  30 tablet    Refill:  5   BREZTRI  AEROSPHERE 160-9-4.8 MCG/ACT AERO inhaler    Sig: Inhale 2 puffs into the lungs 2 (two) times daily.    Dispense:  10.7 g    Refill:  11   omeprazole   (PRILOSEC) 40 MG capsule    Sig: Take 1 capsule (40 mg total) by mouth 2 (two) times daily.    Dispense:  180 capsule    Refill:  3   amLODipine  (NORVASC ) 10 MG tablet    Sig: Take 1 tablet (10 mg total) by mouth daily.    Dispense:  90 tablet    Refill:  3    Follow up plan: Return for 6 month fasting lab > 1 week later Annual Physical.  Future labs ordered for 04/28/2024   Marsa Officer, DO [facility] 10/26/2023, 2:08 PM

## 2023-10-26 NOTE — Patient Instructions (Addendum)
 Thank you for coming to the office today.  Refill Ambien  5mg  sleeping pill, our records show you have taken 1 pill nightly for 2+ years, but now you report you are taking 2 per night. Let's try to reduce this back to 1 pill nightly for sleep and this is a safer option. If you cannot tolerate 1 pill and cannot sleep contact us  back and we can adjust it back to 2 if possible. If you go to 2 pills you would have to pay for extra cost on med because insurance may not approve.  DUE for FASTING BLOOD WORK (no food or drink after midnight before the lab appointment, only water or coffee without cream/sugar on the morning of)  SCHEDULE Lab Only visit in the morning at the clinic for lab draw in 6 MONTHS   - Make sure Lab Only appointment is at about 1 week before your next appointment, so that results will be available  For Lab Results, once available within 2-3 days of blood draw, you can can log in to MyChart online to view your results and a brief explanation. Also, we can discuss results at next follow-up visit.   Please schedule a Follow-up Appointment to: Return for 6 month fasting lab > 1 week later Annual Physical.  If you have any other questions or concerns, please feel free to call the office or send a message through MyChart. You may also schedule an earlier appointment if necessary.  Additionally, you may be receiving a survey about your experience at our office within a few days to 1 week by e-mail or mail. We value your feedback.  Marsa Officer, DO [facility], [place]

## 2024-04-28 ENCOUNTER — Other Ambulatory Visit

## 2024-05-02 ENCOUNTER — Encounter: Admitting: Family Medicine
